# Patient Record
Sex: Female | Born: 1970 | Race: Black or African American | Hispanic: No | Marital: Single | Smoking: Never smoker
Health system: Southern US, Community
[De-identification: ages and names within clinical notes are randomized; demographics above are authoritative.]

## PROBLEM LIST (undated history)

## (undated) DIAGNOSIS — K7460 Unspecified cirrhosis of liver: Secondary | ICD-10-CM

## (undated) DIAGNOSIS — E079 Disorder of thyroid, unspecified: Secondary | ICD-10-CM

## (undated) DIAGNOSIS — E119 Type 2 diabetes mellitus without complications: Secondary | ICD-10-CM

## (undated) DIAGNOSIS — J449 Chronic obstructive pulmonary disease, unspecified: Secondary | ICD-10-CM

## (undated) DIAGNOSIS — F32A Depression, unspecified: Secondary | ICD-10-CM

## (undated) DIAGNOSIS — F329 Major depressive disorder, single episode, unspecified: Secondary | ICD-10-CM

## (undated) DIAGNOSIS — R011 Cardiac murmur, unspecified: Secondary | ICD-10-CM

## (undated) DIAGNOSIS — E669 Obesity, unspecified: Secondary | ICD-10-CM

## (undated) DIAGNOSIS — I10 Essential (primary) hypertension: Secondary | ICD-10-CM

## (undated) HISTORY — PX: THYROIDECTOMY: SHX17

---

## 2003-11-27 ENCOUNTER — Ambulatory Visit: Payer: Self-pay | Admitting: Internal Medicine

## 2003-12-21 ENCOUNTER — Ambulatory Visit: Payer: Self-pay | Admitting: Internal Medicine

## 2004-02-22 ENCOUNTER — Ambulatory Visit: Payer: Self-pay

## 2004-04-01 ENCOUNTER — Ambulatory Visit: Payer: Self-pay | Admitting: Unknown Physician Specialty

## 2004-04-06 ENCOUNTER — Emergency Department: Payer: Self-pay | Admitting: Emergency Medicine

## 2004-05-09 ENCOUNTER — Ambulatory Visit: Payer: Self-pay | Admitting: Oncology

## 2004-06-06 ENCOUNTER — Ambulatory Visit: Payer: Self-pay | Admitting: Oncology

## 2007-01-08 ENCOUNTER — Emergency Department: Payer: Self-pay | Admitting: Emergency Medicine

## 2008-05-17 ENCOUNTER — Emergency Department: Payer: Self-pay | Admitting: Internal Medicine

## 2008-05-18 ENCOUNTER — Emergency Department: Payer: Self-pay | Admitting: Emergency Medicine

## 2009-11-13 ENCOUNTER — Observation Stay: Payer: Self-pay | Admitting: Internal Medicine

## 2009-12-13 ENCOUNTER — Emergency Department: Payer: Self-pay | Admitting: Emergency Medicine

## 2010-11-28 ENCOUNTER — Inpatient Hospital Stay: Payer: Self-pay | Admitting: Internal Medicine

## 2011-09-13 ENCOUNTER — Emergency Department: Payer: Self-pay | Admitting: Emergency Medicine

## 2011-09-16 ENCOUNTER — Emergency Department: Payer: Self-pay | Admitting: Emergency Medicine

## 2011-09-26 ENCOUNTER — Emergency Department: Payer: Self-pay | Admitting: Emergency Medicine

## 2013-08-15 ENCOUNTER — Emergency Department (HOSPITAL_COMMUNITY)
Admission: EM | Admit: 2013-08-15 | Discharge: 2013-08-16 | Disposition: A | Discharge status: Home / Self Care | Payer: Medicaid Other | Attending: Emergency Medicine | Admitting: Emergency Medicine

## 2013-08-15 ENCOUNTER — Encounter (HOSPITAL_COMMUNITY): Payer: Self-pay | Admitting: Emergency Medicine

## 2013-08-15 DIAGNOSIS — R197 Diarrhea, unspecified: Secondary | ICD-10-CM | POA: Diagnosis not present

## 2013-08-15 DIAGNOSIS — Z3202 Encounter for pregnancy test, result negative: Secondary | ICD-10-CM | POA: Diagnosis not present

## 2013-08-15 DIAGNOSIS — J449 Chronic obstructive pulmonary disease, unspecified: Secondary | ICD-10-CM | POA: Diagnosis not present

## 2013-08-15 DIAGNOSIS — K8020 Calculus of gallbladder without cholecystitis without obstruction: Secondary | ICD-10-CM | POA: Insufficient documentation

## 2013-08-15 DIAGNOSIS — R739 Hyperglycemia, unspecified: Secondary | ICD-10-CM

## 2013-08-15 DIAGNOSIS — R109 Unspecified abdominal pain: Secondary | ICD-10-CM | POA: Insufficient documentation

## 2013-08-15 DIAGNOSIS — E119 Type 2 diabetes mellitus without complications: Secondary | ICD-10-CM | POA: Diagnosis not present

## 2013-08-15 DIAGNOSIS — J4489 Other specified chronic obstructive pulmonary disease: Secondary | ICD-10-CM | POA: Insufficient documentation

## 2013-08-15 HISTORY — DX: Type 2 diabetes mellitus without complications: E11.9

## 2013-08-15 HISTORY — DX: Chronic obstructive pulmonary disease, unspecified: J44.9

## 2013-08-15 HISTORY — DX: Obesity, unspecified: E66.9

## 2013-08-15 NOTE — ED Notes (Signed)
Pt. reports intermittent low abdominal pain , low back pain and diarrhea for 2 weeks , denies nausea or vomitting , no fever or chills.

## 2013-08-16 ENCOUNTER — Encounter (HOSPITAL_COMMUNITY): Payer: Self-pay

## 2013-08-16 ENCOUNTER — Emergency Department (HOSPITAL_COMMUNITY): Payer: Medicaid Other

## 2013-08-16 LAB — PREGNANCY, URINE: PREG TEST UR: NEGATIVE

## 2013-08-16 LAB — CBG MONITORING, ED
GLUCOSE-CAPILLARY: 405 mg/dL — AB (ref 70–99)
GLUCOSE-CAPILLARY: 277 mg/dL — AB (ref 70–99)
Glucose-Capillary: 351 mg/dL — ABNORMAL HIGH (ref 70–99)

## 2013-08-16 LAB — URINE MICROSCOPIC-ADD ON

## 2013-08-16 LAB — URINALYSIS, ROUTINE W REFLEX MICROSCOPIC
Bilirubin Urine: NEGATIVE
Glucose, UA: >1000 mg/dL — AB
Hgb urine dipstick: NEGATIVE
Ketones, ur: NEGATIVE mg/dL
Leukocytes, UA: NEGATIVE
NITRITE: NEGATIVE
PH: 6 (ref 5.0–8.0)
Protein, ur: NEGATIVE mg/dL
SPECIFIC GRAVITY, URINE: 1.042 — AB (ref 1.005–1.030)
Urobilinogen, UA: 1 mg/dL (ref 0.0–1.0)

## 2013-08-16 LAB — CBC WITH DIFFERENTIAL/PLATELET
BASOS ABS: 0 10*3/uL (ref 0.0–0.1)
BASOS PCT: 0 % (ref 0–1)
Eosinophils Absolute: 0.2 10*3/uL (ref 0.0–0.7)
Eosinophils Relative: 1 % (ref 0–5)
HCT: 40 % (ref 36.0–46.0)
HEMOGLOBIN: 12.9 g/dL (ref 12.0–15.0)
Lymphocytes Relative: 38 % (ref 12–46)
Lymphs Abs: 4 10*3/uL (ref 0.7–4.0)
MCH: 28.1 pg (ref 26.0–34.0)
MCHC: 32.3 g/dL (ref 30.0–36.0)
MCV: 87.1 fL (ref 78.0–100.0)
Monocytes Absolute: 0.8 10*3/uL (ref 0.1–1.0)
Monocytes Relative: 7 % (ref 3–12)
NEUTROS ABS: 5.6 10*3/uL (ref 1.7–7.7)
NEUTROS PCT: 54 % (ref 43–77)
PLATELETS: 235 10*3/uL (ref 150–400)
RBC: 4.59 MIL/uL (ref 3.87–5.11)
RDW: 14.4 % (ref 11.5–15.5)
WBC: 10.5 10*3/uL (ref 4.0–10.5)

## 2013-08-16 LAB — COMPREHENSIVE METABOLIC PANEL
ALBUMIN: 3 g/dL — AB (ref 3.5–5.2)
ALK PHOS: 182 U/L — AB (ref 39–117)
ALT: 85 U/L — ABNORMAL HIGH (ref 0–35)
AST: 68 U/L — AB (ref 0–37)
Anion gap: 13 (ref 5–15)
BUN: 9 mg/dL (ref 6–23)
CO2: 22 mEq/L (ref 19–32)
Calcium: 8.4 mg/dL (ref 8.4–10.5)
Chloride: 99 mEq/L (ref 96–112)
Creatinine, Ser: 0.62 mg/dL (ref 0.50–1.10)
GFR calc Af Amer: >90 mL/min (ref >90)
GFR calc non Af Amer: >90 mL/min (ref >90)
Glucose, Bld: 493 mg/dL — ABNORMAL HIGH (ref 70–99)
POTASSIUM: 4 meq/L (ref 3.7–5.3)
Sodium: 134 mEq/L — ABNORMAL LOW (ref 137–147)
TOTAL PROTEIN: 7.9 g/dL (ref 6.0–8.3)
Total Bilirubin: 0.2 mg/dL — ABNORMAL LOW (ref 0.3–1.2)

## 2013-08-16 MED ORDER — INSULIN ASPART 100 UNIT/ML ~~LOC~~ SOLN
10.0000 [IU] | Freq: Once | SUBCUTANEOUS | Status: AC
Start: 2013-08-16 — End: 2013-08-16
  Administered 2013-08-16: 10 [IU] via INTRAVENOUS
  Filled 2013-08-16: qty 1

## 2013-08-16 MED ORDER — MORPHINE SULFATE 4 MG/ML IJ SOLN
4.0000 mg | Freq: Once | INTRAMUSCULAR | Status: AC
  Administered 2013-08-16: 4 mg via INTRAVENOUS
  Filled 2013-08-16: qty 1

## 2013-08-16 MED ORDER — SODIUM CHLORIDE 0.9 % IV SOLN: 1000.0000 mL | INTRAVENOUS | Status: DC

## 2013-08-16 MED ORDER — OXYCODONE-ACETAMINOPHEN 5-325 MG PO TABS: 1.0000 | ORAL_TABLET | ORAL | Status: DC | PRN

## 2013-08-16 MED ORDER — INSULIN ASPART 100 UNIT/ML ~~LOC~~ SOLN
10.0000 [IU] | Freq: Once | SUBCUTANEOUS | Status: AC
  Administered 2013-08-16: 10 [IU] via INTRAVENOUS
  Filled 2013-08-16: qty 1

## 2013-08-16 MED ORDER — IOHEXOL 300 MG/ML  SOLN: 25.0000 mL | INTRAMUSCULAR | Status: AC

## 2013-08-16 MED ORDER — SODIUM CHLORIDE 0.9 % IV SOLN
1000.0000 mL | Freq: Once | INTRAVENOUS | Status: AC
  Administered 2013-08-16: 1000 mL via INTRAVENOUS

## 2013-08-16 MED ORDER — IOHEXOL 300 MG/ML  SOLN
100.0000 mL | Freq: Once | INTRAMUSCULAR | Status: AC | PRN
  Administered 2013-08-16: 100 mL via INTRAVENOUS

## 2013-08-16 NOTE — ED Notes (Signed)
Pt finished with oral CT contrast. Thayer Ohmhris from CT made aware.

## 2013-08-16 NOTE — ED Notes (Signed)
Pt A&Ox4, ambulatory at d/c with steady gait, ,NAD

## 2013-08-16 NOTE — Discharge Instructions (Signed)
Resume taking all of your medications until you can discuss them with your PCP.   Abdominal Pain Many things can cause abdominal pain. Usually, abdominal pain is not caused by a disease and will improve without treatment. It can often be observed and treated at home. Your health care provider will do a physical exam and possibly order blood tests and X-rays to help determine the seriousness of your pain. However, in many cases, more time must pass before a clear cause of the pain can be found. Before that point, your health care provider may not know if you need more testing or further treatment. HOME CARE INSTRUCTIONS  Monitor your abdominal pain for any changes. The following actions may help to alleviate any discomfort you are experiencing:  Only take over-the-counter or prescription medicines as directed by your health care provider.  Do not take laxatives unless directed to do so by your health care provider.  Try a clear liquid diet (broth, tea, or water) as directed by your health care provider. Slowly move to a bland diet as tolerated. SEEK MEDICAL CARE IF:  You have unexplained abdominal pain.  You have abdominal pain associated with nausea or diarrhea.  You have pain when you urinate or have a bowel movement.  You experience abdominal pain that wakes you in the night.  You have abdominal pain that is worsened or improved by eating food.  You have abdominal pain that is worsened with eating fatty foods.  You have a fever. SEEK IMMEDIATE MEDICAL CARE IF:   Your pain does not go away within 2 hours.  You keep throwing up (vomiting).  Your pain is felt only in portions of the abdomen, such as the right side or the left lower portion of the abdomen.  You pass bloody or black tarry stools. MAKE SURE YOU:  Understand these instructions.   Will watch your condition.   Will get help right away if you are not doing well or get worse.  Document Released: 10/02/2004  Document Revised: 12/28/2012 Document Reviewed: 09/01/2012 [employer] Patient Information 2015 [place], [place]. This information is not intended to replace advice given to you by your health care provider. Make sure you discuss any questions you have with your health care provider.  Cholelithiasis Cholelithiasis (also called gallstones) is a form of gallbladder disease in which gallstones form in your gallbladder. The gallbladder is an organ that stores bile made in the liver, which helps digest fats. Gallstones begin as small crystals and slowly grow into stones. Gallstone pain occurs when the gallbladder spasms and a gallstone is blocking the duct. Pain can also occur when a stone passes out of the duct.  RISK FACTORS  Being female.   Having multiple pregnancies. Health care providers sometimes advise removing diseased gallbladders before future pregnancies.   Being obese.  Eating a diet heavy in fried foods and fat.   Being older than 60 years and increasing age.   Prolonged use of medicines containing female hormones.   Having diabetes mellitus.   Rapidly losing weight.   Having a family history of gallstones (heredity).  SYMPTOMS  Nausea.   Vomiting.  Abdominal pain.   Yellowing of the skin (jaundice).   Sudden pain. It may persist from several minutes to several hours.  Fever.   Tenderness to the touch. In some cases, when gallstones do not move into the bile duct, people have no pain or symptoms. These are called "silent" gallstones.  TREATMENT Silent gallstones do not need treatment.  In severe cases, emergency surgery may be required. Options for treatment include:  Surgery to remove the gallbladder. This is the most common treatment.  Medicines. These do not always work and may take 6-12 months or more to work.  Shock wave treatment (extracorporeal biliary lithotripsy). In this treatment an ultrasound machine sends shock waves to the gallbladder to  break gallstones into smaller pieces that can pass into the intestines or be dissolved by medicine. HOME CARE INSTRUCTIONS   Only take over-the-counter or prescription medicines for pain, discomfort, or fever as directed by your health care provider.   Follow a low-fat diet until seen again by your health care provider. Fat causes the gallbladder to contract, which can result in pain.   Follow up with your health care provider as directed. Attacks are almost always recurrent and surgery is usually required for permanent treatment.  SEEK IMMEDIATE MEDICAL CARE IF:   Your pain increases and is not controlled by medicines.   You have a fever or persistent symptoms for more than 2-3 days.   You have a fever and your symptoms suddenly get worse.   You have persistent nausea and vomiting.  MAKE SURE YOU:   Understand these instructions.  Will watch your condition.  Will get help right away if you are not doing well or get worse. Document Released: 12/19/2004 Document Revised: 08/25/2012 Document Reviewed: 06/16/2012 [facility] Patient Information 2015 [place], [place]. This information is not intended to replace advice given to you by your health care provider. Make sure you discuss any questions you have with your health care provider.   Acetaminophen; Oxycodone tablets What is this medicine? ACETAMINOPHEN; OXYCODONE (a set a MEE noe fen; ox i KOE done) is a pain reliever. It is used to treat mild to moderate pain. This medicine may be used for other purposes; ask your health care provider or pharmacist if you have questions. COMMON BRAND NAME(S): Endocet, Magnacet, Narvox, Percocet, Perloxx, Primalev, Primlev, Roxicet, Xolox What should I tell my health care provider before I take this medicine? They need to know if you have any of these conditions: -brain tumor -Crohn's disease, inflammatory bowel disease, or ulcerative colitis -drug abuse or addiction -head injury -heart or  circulation problems -if you often drink alcohol -kidney disease or problems going to the bathroom -liver disease -lung disease, asthma, or breathing problems -an unusual or allergic reaction to acetaminophen, oxycodone, other opioid analgesics, other medicines, foods, dyes, or preservatives -pregnant or trying to get pregnant -breast-feeding How should I use this medicine? Take this medicine by mouth with a full glass of water. Follow the directions on the prescription label. Take your medicine at regular intervals. Do not take your medicine more often than directed. Talk to your pediatrician regarding the use of this medicine in children. Special care may be needed. Patients over 37 years old may have a stronger reaction and need a smaller dose. Overdosage: If you think you have taken too much of this medicine contact a poison control center or emergency room at once. NOTE: This medicine is only for you. Do not share this medicine with others. What if I miss a dose? If you miss a dose, take it as soon as you can. If it is almost time for your next dose, take only that dose. Do not take double or extra doses. What may interact with this medicine? -alcohol -antihistamines -barbiturates like amobarbital, butalbital, butabarbital, methohexital, pentobarbital, phenobarbital, thiopental, and secobarbital -benztropine -drugs for bladder problems like solifenacin, trospium,  oxybutynin, tolterodine, hyoscyamine, and methscopolamine -drugs for breathing problems like ipratropium and tiotropium -drugs for certain stomach or intestine problems like propantheline, homatropine methylbromide, glycopyrrolate, atropine, belladonna, and dicyclomine -general anesthetics like etomidate, ketamine, nitrous oxide, propofol, desflurane, enflurane, halothane, isoflurane, and sevoflurane -medicines for depression, anxiety, or psychotic disturbances -medicines for sleep -muscle relaxants -naltrexone -narcotic  medicines (opiates) for pain -phenothiazines like perphenazine, thioridazine, chlorpromazine, mesoridazine, fluphenazine, prochlorperazine, promazine, and trifluoperazine -scopolamine -tramadol -trihexyphenidyl This list may not describe all possible interactions. Give your health care provider a list of all the medicines, herbs, non-prescription drugs, or dietary supplements you use. Also tell them if you smoke, drink alcohol, or use illegal drugs. Some items may interact with your medicine. What should I watch for while using this medicine? Tell your doctor or health care professional if your pain does not go away, if it gets worse, or if you have new or a different type of pain. You may develop tolerance to the medicine. Tolerance means that you will need a higher dose of the medication for pain relief. Tolerance is normal and is expected if you take this medicine for a long time. Do not suddenly stop taking your medicine because you may develop a severe reaction. Your body becomes used to the medicine. This does NOT mean you are addicted. Addiction is a behavior related to getting and using a drug for a non-medical reason. If you have pain, you have a medical reason to take pain medicine. Your doctor will tell you how much medicine to take. If your doctor wants you to stop the medicine, the dose will be slowly lowered over time to avoid any side effects. You may get drowsy or dizzy. Do not drive, use machinery, or do anything that needs mental alertness until you know how this medicine affects you. Do not stand or sit up quickly, especially if you are an older patient. This reduces the risk of dizzy or fainting spells. Alcohol may interfere with the effect of this medicine. Avoid alcoholic drinks. There are different types of narcotic medicines (opiates) for pain. If you take more than one type at the same time, you may have more side effects. Give your health care provider a list of all medicines you  use. Your doctor will tell you how much medicine to take. Do not take more medicine than directed. Call emergency for help if you have problems breathing. The medicine will cause constipation. Try to have a bowel movement at least every 2 to 3 days. If you do not have a bowel movement for 3 days, call your doctor or health care professional. Do not take Tylenol (acetaminophen) or medicines that have acetaminophen with this medicine. Too much acetaminophen can be very dangerous. Many nonprescription medicines contain acetaminophen. Always read the labels carefully to avoid taking more acetaminophen. What side effects may I notice from receiving this medicine? Side effects that you should report to your doctor or health care professional as soon as possible: -allergic reactions like skin rash, itching or hives, swelling of the face, lips, or tongue -breathing difficulties, wheezing -confusion -light headedness or fainting spells -severe stomach pain -unusually weak or tired -yellowing of the skin or the whites of the eyes Side effects that usually do not require medical attention (report to your doctor or health care professional if they continue or are bothersome): -dizziness -drowsiness -nausea -vomiting This list may not describe all possible side effects. Call your doctor for medical advice about side effects. You may report side  effects to FDA at 1-800-FDA-1088. Where should I keep my medicine? Keep out of the reach of children. This medicine can be abused. Keep your medicine in a safe place to protect it from theft. Do not share this medicine with anyone. Selling or giving away this medicine is dangerous and against the law. Store at room temperature between 20 and 25 degrees C (68 and 77 degrees F). Keep container tightly closed. Protect from light. This medicine may cause accidental overdose and death if it is taken by other adults, children, or pets. Flush any unused medicine down the  toilet to reduce the chance of harm. Do not use the medicine after the expiration date. NOTE: This sheet is a summary. It may not cover all possible information. If you have questions about this medicine, talk to your doctor, pharmacist, or health care provider.  2015, [employer]. (2012-08-16 13:17:35)

## 2013-08-16 NOTE — ED Provider Notes (Signed)
CSN: [id number]     Arrival date & time 08/15/13  2220 History   First MD Initiated Contact with Patient 08/16/13 0045     Chief Complaint  Patient presents with  . Abdominal Pain     (Consider location/radiation/quality/duration/timing/severity/associated sxs/prior Treatment) Patient is a 43 y.o. female presenting with abdominal pain. The history is provided by the patient.  Abdominal Pain She has been having difficulty with suprapubic pain for the last 2 weeks. She states that pain will radiate to the left side of her abdomen and she is in the left side and will radiate to the right side of her abdomen she lays on her right side and will radiate throughout her entire abdomen the patient is in her abdomen. She rates it a 10/10. Nothing makes it better. She denies nausea vomiting. She denies fevers, chills, sweats. She developed diarrhea 3 days ago and has been having one loose bowel movement a day since then. It is not affected by bowel movement. She has had urinary frequency but not urgency or tenesmus or dysuria. Of note, she is diabetic and states that she stopped taking her Invokana and another oral medication because he heard they were involved in a lawsuit. She has not discussed this with her PCP. She has not been able to check her blood sugars because her monitor is not working and she has not been able to get a replacement.  Past Medical History  Diagnosis Date  . Diabetes mellitus without complication   . COPD (chronic obstructive pulmonary disease)   . Obesity    Past Surgical History  Procedure Laterality Date  . Thyroidectomy    . Cesarean section     No family history on file. History  Substance Use Topics  . Smoking status: Never Smoker   . Smokeless tobacco: Not on file  . Alcohol Use: No   OB History   Grav Para Term Preterm Abortions TAB SAB Ect Mult Living                 Review of Systems  Gastrointestinal: Positive for abdominal pain.  All other systems  reviewed and are negative.     Allergies  Review of patient's allergies indicates no known allergies.  Home Medications   Prior to Admission medications   Not on File   BP 131/81  Pulse 106  Temp(Src) 98.4 F (36.9 C) (Oral)  Resp 14  Ht 5\' 7"  (1.702 m)  Wt 242 lb (109.77 kg)  BMI 37.89 kg/m2  SpO2 97% Physical Exam  Nursing note and vitals reviewed.  Morbidly obese 43 year old female, resting comfortably and in no acute distress. Vital signs are significant for tachycardia with heart rate 106. Oxygen saturation is 97%, which is normal. Head is normocephalic and atraumatic. PERRLA, EOMI. Oropharynx is clear. Neck is nontender and supple without adenopathy or JVD. Back is nontender and there is no CVA tenderness. Lungs are clear without rales, wheezes, or rhonchi. Chest is nontender. Heart has regular rate and rhythm without murmur. Abdomen is soft, flat, with mild tenderness diffusely. There is no rebound or guarding. There are no masses or hepatosplenomegaly and peristalsis is hypoactive. Extremities have trace edema, full range of motion is present. Skin is warm and dry without rash. Neurologic: Mental status is normal, cranial nerves are intact, there are no motor or sensory deficits.  ED Course  Procedures (including critical care time) Labs Review Results for orders placed during the hospital encounter of 08/15/13  CBC  WITH DIFFERENTIAL      Result Value Ref Range   WBC 10.5  4.0 - 10.5 K/uL   RBC 4.59  3.87 - 5.11 MIL/uL   Hemoglobin 12.9  12.0 - 15.0 g/dL   HCT 98.1  19.1 - 47.8 %   MCV 87.1  78.0 - 100.0 fL   MCH 28.1  26.0 - 34.0 pg   MCHC 32.3  30.0 - 36.0 g/dL   RDW 29.5  62.1 - 30.8 %   Platelets 235  150 - 400 K/uL   Neutrophils Relative % 54  43 - 77 %   Neutro Abs 5.6  1.7 - 7.7 K/uL   Lymphocytes Relative 38  12 - 46 %   Lymphs Abs 4.0  0.7 - 4.0 K/uL   Monocytes Relative 7  3 - 12 %   Monocytes Absolute 0.8  0.1 - 1.0 K/uL   Eosinophils  Relative 1  0 - 5 %   Eosinophils Absolute 0.2  0.0 - 0.7 K/uL   Basophils Relative 0  0 - 1 %   Basophils Absolute 0.0  0.0 - 0.1 K/uL  COMPREHENSIVE METABOLIC PANEL      Result Value Ref Range   Sodium 134 (*) 137 - 147 mEq/L   Potassium 4.0  3.7 - 5.3 mEq/L   Chloride 99  96 - 112 mEq/L   CO2 22  19 - 32 mEq/L   Glucose, Bld 493 (*) 70 - 99 mg/dL   BUN 9  6 - 23 mg/dL   Creatinine, Ser 6.57  0.50 - 1.10 mg/dL   Calcium 8.4  8.4 - 84.6 mg/dL   Total Protein 7.9  6.0 - 8.3 g/dL   Albumin 3.0 (*) 3.5 - 5.2 g/dL   AST 68 (*) 0 - 37 U/L   ALT 85 (*) 0 - 35 U/L   Alkaline Phosphatase 182 (*) 39 - 117 U/L   Total Bilirubin 0.2 (*) 0.3 - 1.2 mg/dL   GFR calc non Af Amer >90  >90 mL/min   GFR calc Af Amer >90  >90 mL/min   Anion gap 13  5 - 15  PREGNANCY, URINE      Result Value Ref Range   Preg Test, Ur NEGATIVE  NEGATIVE  URINALYSIS, ROUTINE W REFLEX MICROSCOPIC      Result Value Ref Range   Color, Urine YELLOW  YELLOW   APPearance CLEAR  CLEAR   Specific Gravity, Urine 1.042 (*) 1.005 - 1.030   pH 6.0  5.0 - 8.0   Glucose, UA >1000 (*) NEGATIVE mg/dL   Hgb urine dipstick NEGATIVE  NEGATIVE   Bilirubin Urine NEGATIVE  NEGATIVE   Ketones, ur NEGATIVE  NEGATIVE mg/dL   Protein, ur NEGATIVE  NEGATIVE mg/dL   Urobilinogen, UA 1.0  0.0 - 1.0 mg/dL   Nitrite NEGATIVE  NEGATIVE   Leukocytes, UA NEGATIVE  NEGATIVE  URINE MICROSCOPIC-ADD ON      Result Value Ref Range   Squamous Epithelial / LPF FEW (*) RARE   WBC, UA 0-2  <3 WBC/hpf   RBC / HPF 0-2  <3 RBC/hpf   Bacteria, UA FEW (*) RARE   Urine-Other FEW YEAST    CBG MONITORING, ED      Result Value Ref Range   Glucose-Capillary 405 (*) 70 - 99 mg/dL  CBG MONITORING, ED      Result Value Ref Range   Glucose-Capillary 351 (*) 70 - 99 mg/dL   Comment 1 Notify RN  Comment 2 Documented in Chart    CBG MONITORING, ED      Result Value Ref Range   Glucose-Capillary 277 (*) 70 - 99 mg/dL   Comment 1 Notify RN     Comment 2  Documented in Chart     Imaging Review Ct Abdomen Pelvis W Contrast  08/16/2013   CLINICAL DATA:  Intermittent lower abdominal pain. Low back pain. Diarrhea.  EXAM: CT ABDOMEN AND PELVIS WITH CONTRAST  TECHNIQUE: Multidetector CT imaging of the abdomen and pelvis was performed using the standard protocol following bolus administration of intravenous contrast.  CONTRAST:  OMNIPAQUE IOHEXOL 300 MG/ML  SOLN  COMPARISON:  No priors.  FINDINGS: Lung Bases: Unremarkable.  Abdomen/Pelvis: There are several noncalcified partially cavitary gallstones in the gallbladder, and in the neck of the gallbladder there is a densely calcified gallstone. Gallbladder appears moderately distended, filled with high attenuation material (likely biliary sludge). However, there is no pericholecystic fluid or surrounding inflammatory changes to strongly suggest an acute cholecystitis at this time. The liver has a very subtle nodular contour, suggestive of early changes of cirrhosis. No focal cystic or solid hepatic lesion. The appearance of the pancreas, spleen, bilateral adrenal glands and bilateral kidneys is unremarkable.  No significant volume of ascites. No pneumoperitoneum. No pathologic distention of small bowel. Normal appendix. No lymphadenopathy identified in the abdomen or pelvis. There are multiple borderline enlarged retroperitoneal lymph nodes which are conspicuous by the number rather than their size, however, these are nonspecific. Uterus and ovaries are unremarkable in appearance. Urinary bladder is normal in appearance.  Musculoskeletal: There are no aggressive appearing lytic or blastic lesions noted in the visualized portions of the skeleton.  IMPRESSION: 1. Cholelithiasis without findings to suggest an acute cholecystitis at this time. However, the gallbladder is moderately distended, largely filled with a combination of gallstones and biliary sludge. 2. Early morphologic changes in the liver suggestive of  cirrhosis. 3. Normal appendix.   Electronically Signed   By: Trudie Reed M.D.   On: 08/16/2013 02:13   MDM   Final diagnoses:  None    Abdominal pain of uncertain cause. WBC is normal and exam is relatively benign. However, she is diabetic and running blood sugars of close to 500. Anion gap is normal so she is not in ketoacidosis. Mild elevation of transaminases and alkaline phosphatase is noted of uncertain cause but unlikely to be related to her current complaints. Because of underlying diabetes, I am concerned that there may be significant pathology which is being obscured by her diabetes and she is sent for CT scan. She will be given IV fluid bolus and intravenous insulin. She has no records available.  CT shows evidence of cholelithiasis without cholecystitis. No acute findings are present. Patient did note significant relief of pain with morphine but pain is coming back. Blood sugars come down to 70/400. She'll be given another dose of insulin and hand dose of morphine will be repeated. She will be referred to Gen. surgery to evaluate for possible elective cholecystectomy and will be discharged with prescription for oxycodone-acetaminophen.  Dione Booze, MD 08/16/13 [phone]

## 2013-08-16 NOTE — ED Notes (Signed)
Patient presents stating that she has been having lower abd pain  Denies discharge or painful urination.  States she has been going to the [facility] frequently.  States everyday between 11-12 she has a bout of diahrrea.  Denies N/V

## 2013-08-21 ENCOUNTER — Emergency Department (HOSPITAL_BASED_OUTPATIENT_CLINIC_OR_DEPARTMENT_OTHER)
Admission: EM | Admit: 2013-08-21 | Discharge: 2013-08-21 | Disposition: A | Discharge status: Home / Self Care | Payer: Medicaid Other | Attending: Emergency Medicine | Admitting: Emergency Medicine

## 2013-08-21 ENCOUNTER — Emergency Department (HOSPITAL_BASED_OUTPATIENT_CLINIC_OR_DEPARTMENT_OTHER): Payer: Medicaid Other

## 2013-08-21 ENCOUNTER — Encounter (HOSPITAL_BASED_OUTPATIENT_CLINIC_OR_DEPARTMENT_OTHER): Payer: Self-pay | Admitting: Emergency Medicine

## 2013-08-21 DIAGNOSIS — J449 Chronic obstructive pulmonary disease, unspecified: Secondary | ICD-10-CM | POA: Diagnosis not present

## 2013-08-21 DIAGNOSIS — Z79899 Other long term (current) drug therapy: Secondary | ICD-10-CM | POA: Diagnosis not present

## 2013-08-21 DIAGNOSIS — R197 Diarrhea, unspecified: Secondary | ICD-10-CM | POA: Diagnosis not present

## 2013-08-21 DIAGNOSIS — E669 Obesity, unspecified: Secondary | ICD-10-CM | POA: Insufficient documentation

## 2013-08-21 DIAGNOSIS — E119 Type 2 diabetes mellitus without complications: Secondary | ICD-10-CM | POA: Diagnosis not present

## 2013-08-21 DIAGNOSIS — Z794 Long term (current) use of insulin: Secondary | ICD-10-CM | POA: Diagnosis not present

## 2013-08-21 DIAGNOSIS — R102 Pelvic and perineal pain: Secondary | ICD-10-CM

## 2013-08-21 DIAGNOSIS — Z9889 Other specified postprocedural states: Secondary | ICD-10-CM | POA: Insufficient documentation

## 2013-08-21 DIAGNOSIS — N949 Unspecified condition associated with female genital organs and menstrual cycle: Secondary | ICD-10-CM | POA: Insufficient documentation

## 2013-08-21 DIAGNOSIS — Z3202 Encounter for pregnancy test, result negative: Secondary | ICD-10-CM | POA: Diagnosis not present

## 2013-08-21 DIAGNOSIS — J4489 Other specified chronic obstructive pulmonary disease: Secondary | ICD-10-CM | POA: Insufficient documentation

## 2013-08-21 DIAGNOSIS — R1032 Left lower quadrant pain: Secondary | ICD-10-CM | POA: Insufficient documentation

## 2013-08-21 DIAGNOSIS — R110 Nausea: Secondary | ICD-10-CM | POA: Insufficient documentation

## 2013-08-21 LAB — URINALYSIS, ROUTINE W REFLEX MICROSCOPIC
Bilirubin Urine: NEGATIVE
Glucose, UA: >1000 mg/dL — AB
Hgb urine dipstick: NEGATIVE
KETONES UR: NEGATIVE mg/dL
LEUKOCYTES UA: NEGATIVE
NITRITE: NEGATIVE
PROTEIN: NEGATIVE mg/dL
Specific Gravity, Urine: 1.041 — ABNORMAL HIGH (ref 1.005–1.030)
UROBILINOGEN UA: 1 mg/dL (ref 0.0–1.0)
pH: 6 (ref 5.0–8.0)

## 2013-08-21 LAB — WET PREP, GENITAL
CLUE CELLS WET PREP: NONE SEEN
TRICH WET PREP: NONE SEEN

## 2013-08-21 LAB — URINE MICROSCOPIC-ADD ON

## 2013-08-21 LAB — COMPREHENSIVE METABOLIC PANEL
ALBUMIN: 3.2 g/dL — AB (ref 3.5–5.2)
ALK PHOS: 185 U/L — AB (ref 39–117)
ALT: 103 U/L — AB (ref 0–35)
AST: 68 U/L — AB (ref 0–37)
Anion gap: 14 (ref 5–15)
BILIRUBIN TOTAL: 0.4 mg/dL (ref 0.3–1.2)
BUN: 10 mg/dL (ref 6–23)
CHLORIDE: 97 meq/L (ref 96–112)
CO2: 23 mEq/L (ref 19–32)
Calcium: 9.5 mg/dL (ref 8.4–10.5)
Creatinine, Ser: 0.8 mg/dL (ref 0.50–1.10)
GFR calc Af Amer: >90 mL/min (ref >90)
GFR calc non Af Amer: 90 mL/min — ABNORMAL LOW (ref >90)
Glucose, Bld: 414 mg/dL — ABNORMAL HIGH (ref 70–99)
POTASSIUM: 3.9 meq/L (ref 3.7–5.3)
SODIUM: 134 meq/L — AB (ref 137–147)
TOTAL PROTEIN: 8.4 g/dL — AB (ref 6.0–8.3)

## 2013-08-21 LAB — CBC WITH DIFFERENTIAL/PLATELET
BASOS ABS: 0.1 10*3/uL (ref 0.0–0.1)
BASOS PCT: 1 % (ref 0–1)
Eosinophils Absolute: 0.2 10*3/uL (ref 0.0–0.7)
Eosinophils Relative: 2 % (ref 0–5)
HCT: 38.4 % (ref 36.0–46.0)
HEMOGLOBIN: 12.7 g/dL (ref 12.0–15.0)
LYMPHS PCT: 40 % (ref 12–46)
Lymphs Abs: 3.7 10*3/uL (ref 0.7–4.0)
MCH: 28.4 pg (ref 26.0–34.0)
MCHC: 33.1 g/dL (ref 30.0–36.0)
MCV: 85.9 fL (ref 78.0–100.0)
MONO ABS: 0.7 10*3/uL (ref 0.1–1.0)
MONOS PCT: 8 % (ref 3–12)
NEUTROS ABS: 4.7 10*3/uL (ref 1.7–7.7)
Neutrophils Relative %: 50 % (ref 43–77)
Platelets: 223 10*3/uL (ref 150–400)
RBC: 4.47 MIL/uL (ref 3.87–5.11)
RDW: 14.4 % (ref 11.5–15.5)
WBC: 9.3 10*3/uL (ref 4.0–10.5)

## 2013-08-21 LAB — PREGNANCY, URINE: PREG TEST UR: NEGATIVE

## 2013-08-21 LAB — RPR

## 2013-08-21 LAB — HIV ANTIBODY (ROUTINE TESTING W REFLEX): HIV 1&2 Ab, 4th Generation: NONREACTIVE

## 2013-08-21 MED ORDER — METFORMIN HCL 500 MG PO TABS: 500.0000 mg | ORAL_TABLET | Freq: Two times a day (BID) | ORAL | Status: DC

## 2013-08-21 MED ORDER — OXYCODONE-ACETAMINOPHEN 5-325 MG PO TABS
2.0000 | ORAL_TABLET | Freq: Once | ORAL | Status: AC
  Administered 2013-08-21: 2 via ORAL
  Filled 2013-08-21: qty 2

## 2013-08-21 MED ORDER — FLUCONAZOLE 50 MG PO TABS
150.0000 mg | ORAL_TABLET | Freq: Once | ORAL | Status: AC
  Administered 2013-08-21: 150 mg via ORAL
  Filled 2013-08-21 (×2): qty 1

## 2013-08-21 MED ORDER — METFORMIN HCL 500 MG PO TABS
500.0000 mg | ORAL_TABLET | Freq: Once | ORAL | Status: AC
  Administered 2013-08-21: 500 mg via ORAL
  Filled 2013-08-21: qty 1

## 2013-08-21 MED ORDER — OXYCODONE HCL 5 MG PO TABS: 5.0000 mg | ORAL_TABLET | ORAL | Status: DC | PRN

## 2013-08-21 NOTE — ED Notes (Signed)
MD at bedside. 

## 2013-08-21 NOTE — ED Provider Notes (Addendum)
CSN: [id number]     Arrival date & time 08/21/13  1341 History  This chart was scribed for Doug Sou, MD by Julian Hy, ED Scribe. The patient was seen in [facility]. The patient's care was started at 3:48 PM.    Chief Complaint  Patient presents with  . Abdominal Pain    Patient is a 43 y.o. female presenting with abdominal pain. The history is provided by the patient. No language interpreter was used.  Abdominal Pain Pain location:  LLQ and RLQ Pain radiates to:  Back Pain severity:  Moderate Duration:  3 weeks Timing:  Intermittent Progression:  Worsening Context: not previous surgeries and not recent sexual activity   Relieved by: Percocet. Worsened by:  Movement Associated symptoms: diarrhea and nausea   Associated symptoms: no dysuria, no fever, no vaginal discharge and no vomiting    HPI Comments: Sheila Gibson is a 44 y.o. female who presents to the Emergency Department complaining of new, intermittent, severe lower abdominal pain onset 3 weeks ago. Pt states her pain feels like "labor" pain and states her pain is worse than her previous pain experienced giving birth. Pt reports this episode started last night approximately 21 hours ago. Pt states she has associated nausea and diarrhea. Pt reports she took Tramadol without relief. Pt was seen in the ED for similar symptoms on 8/10 2015, , at that time c/o pain for 2 weeks per old chart,and was prescribed Percocet and found relief. Pt reports movement worsens her pain. Pt denies her pain is not associated with eating. Pt reports she was unable to see her PCP 2 days ago  due to emergency that MD had. Pt reports she has loose, diarrhea-like BM every night around 10:00pm. Pt denies previous abdom is inal surgeries except for two cesarean sections. Pt reports she ate 1 pancake, eggs and bacon this morning. Pt reports her last BM was this morning and was normal. Pt reports she has irregular periods. Pt denies dysuria, vomiting,  vaginal discharge,and fever. Not sexually active  Pt reports she has COPD and diabetes. LMP: July 2015.   Past Medical History  Diagnosis Date  . Diabetes mellitus without complication   . COPD (chronic obstructive pulmonary disease)   . Obesity    Past Surgical History  Procedure Laterality Date  . Thyroidectomy    . Cesarean section     No family history on file. History  Substance Use Topics  . Smoking status: Never Smoker   . Smokeless tobacco: Not on file  . Alcohol Use: No   OB History   Grav Para Term Preterm Abortions TAB SAB Ect Mult Living                 Review of Systems  Constitutional: Negative.  Negative for fever.  HENT: Negative.   Respiratory: Negative.   Cardiovascular: Negative.   Gastrointestinal: Positive for nausea, abdominal pain and diarrhea. Negative for vomiting.  Genitourinary: Negative for dysuria and vaginal discharge.  Musculoskeletal: Negative.   Skin: Negative.   Neurological: Negative.   Psychiatric/Behavioral: Negative.   All other systems reviewed and are negative.     Allergies  Review of patient's allergies indicates no known allergies.  Home Medications   Prior to Admission medications   Medication Sig Start Date End Date Taking? Authorizing Provider  fluconazole (DIFLUCAN) 150 MG tablet Take 150 mg by mouth once. Three day course    Historical Provider, MD  insulin glargine (LANTUS) 100 unit/mL SOPN Inject 60 Units  into the skin at bedtime.    Historical Provider, MD  oxybutynin (DITROPAN) 5 MG tablet Take 5 mg by mouth 3 (three) times daily.    Historical Provider, MD  oxyCODONE-acetaminophen (PERCOCET) 5-325 MG per tablet Take 1 tablet by mouth every 4 (four) hours as needed for moderate pain. 08/16/13   Dione Booze, MD   Triage Vitals: BP 153/94  Pulse 106  Temp(Src) 98.1 F (36.7 C) (Oral)  Resp 20  Ht 5\' 7"  (1.702 m)  Wt 240 lb (108.863 kg)  BMI 37.58 kg/m2  SpO2 99% Physical Exam  Nursing note and vitals  reviewed. Constitutional: She appears well-developed and well-nourished.  HENT:  Head: Normocephalic and atraumatic.  Eyes: Conjunctivae are normal. Pupils are equal, round, and reactive to light.  Neck: Neck supple. No tracheal deviation present. No thyromegaly present.  Cardiovascular: Normal rate and regular rhythm.   No murmur heard. Pulmonary/Chest: Effort normal and breath sounds normal.  Abdominal: Soft. Bowel sounds are normal. She exhibits mass. She exhibits no distension. There is tenderness. There is no rebound and no guarding.  Morbidly obese tender @ suprapubic area  Genitourinary:  No  externalc lesion, os closed, yellow d/c no blood in vault  Positive cervical motion tenderness and bilatno adnexal tenderness or masses  Musculoskeletal: Normal range of motion. She exhibits no edema and no tenderness.  Neurological: She is alert. Coordination normal.  Skin: Skin is warm and dry. No rash noted.  Psychiatric: She has a normal mood and affect.    ED Course  Procedures (including critical care time) DIAGNOSTIC STUDIES: Oxygen Saturation is 99% on RA, normal by my interpretation.    COORDINATION OF CARE:3:52 PM- Patient informed of current plan for treatment and evaluation and agrees with plan at this time.  Labs Review Labs Reviewed  WET PREP, GENITAL - Abnormal; Notable for the following:    Yeast Wet Prep HPF POC MODERATE (*)    WBC, Wet Prep HPF POC FEW (*)    All other components within normal limits  URINALYSIS, ROUTINE W REFLEX MICROSCOPIC - Abnormal; Notable for the following:    Specific Gravity, Urine 1.041 (*)    Glucose, UA >1000 (*)    All other components within normal limits  COMPREHENSIVE METABOLIC PANEL - Abnormal; Notable for the following:    Sodium 134 (*)    Glucose, Bld 414 (*)    Total Protein 8.4 (*)    Albumin 3.2 (*)    AST 68 (*)    ALT 103 (*)    Alkaline Phosphatase 185 (*)    GFR calc non Af Amer 90 (*)    All other components within  normal limits  GC/CHLAMYDIA PROBE AMP  PREGNANCY, URINE  CBC WITH DIFFERENTIAL  URINE MICROSCOPIC-ADD ON  RPR  HIV ANTIBODY (ROUTINE TESTING)    Imaging Review US Transvaginal Non-ob 08/21/2013   CLINICAL DATA:  Mid pelvic pain, radiating to bilateral lower quadrants for 3 weeks.  EXAM: TRANSABDOMINAL AND TRANSVAGINAL ULTRASOUND OF PELVIS  TECHNIQUE: Both transabdominal and transvaginal ultrasound examinations of the pelvis were performed. Transabdominal technique was performed for global imaging of the pelvis including uterus, ovaries, adnexal regions, and pelvic cul-de-sac. It was necessary to proceed with endovaginal exam following the transabdominal exam to visualize the uterus, ovaries, and adnexa.  COMPARISON:  Abdominal pelvic CT of 08/16/2013  FINDINGS: Uterus:  10.7 x 5.9 x 7.0 cm.  Normal in morphology.  Endometrium: Suboptimally visualized secondary to patient body habitus. Measures 1.9 cm maximally where  visualized.  Right Ovary: 4.2 x 1.9 x 2.5 cm. Normal in morphology. Suboptimally visualized secondary to high position in pelvis. Normal on recent CT.  Left Ovary:  2.9 x 2.2 x 2.8 cm.  Normal in morphology.  Other Findings:  No significant free fluid.  IMPRESSION: 1. Mild to moderate degradation secondary to patient body habitus and right ovary position. 2. Suspicion of endometrial thickening for age. Consider follow-up by [place] in 6-8 weeks, during the week immediately following menses (exam timing is critical).   Electronically Signed   By: Jeronimo Greaves M.D.   On: 08/21/2013 17:02   US Pelvis Complete 08/21/2013   CLINICAL DATA:  Mid pelvic pain, radiating to bilateral lower quadrants for 3 weeks.  EXAM: TRANSABDOMINAL AND TRANSVAGINAL ULTRASOUND OF PELVIS  TECHNIQUE: Both transabdominal and transvaginal ultrasound examinations of the pelvis were performed. Transabdominal technique was performed for global imaging of the pelvis including uterus, ovaries, adnexal regions, and pelvic  cul-de-sac. It was necessary to proceed with endovaginal exam following the transabdominal exam to visualize the uterus, ovaries, and adnexa.  COMPARISON:  Abdominal pelvic CT of 08/16/2013  FINDINGS: Uterus:  10.7 x 5.9 x 7.0 cm.  Normal in morphology.  Endometrium: Suboptimally visualized secondary to patient body habitus. Measures 1.9 cm maximally where visualized.  Right Ovary: 4.2 x 1.9 x 2.5 cm. Normal in morphology. Suboptimally visualized secondary to high position in pelvis. Normal on recent CT.  Left Ovary:  2.9 x 2.2 x 2.8 cm.  Normal in morphology.  Other Findings:  No significant free fluid.  IMPRESSION: 1. Mild to moderate degradation secondary to patient body habitus and right ovary position. 2. Suspicion of endometrial thickening for age. Consider follow-up by [place] in 6-8 weeks, during the week immediately following menses (exam timing is critical).   Electronically Signed   By: Jeronimo Greaves M.D.   On: 08/21/2013 17:02  Results for orders placed during the hospital encounter of 08/21/13  WET PREP, GENITAL      Result Value Ref Range   Yeast Wet Prep HPF POC MODERATE (*) NONE SEEN   Trich, Wet Prep NONE SEEN  NONE SEEN   Clue Cells Wet Prep HPF POC NONE SEEN  NONE SEEN   WBC, Wet Prep HPF POC FEW (*) NONE SEEN  URINALYSIS, ROUTINE W REFLEX MICROSCOPIC      Result Value Ref Range   Color, Urine YELLOW  YELLOW   APPearance CLEAR  CLEAR   Specific Gravity, Urine 1.041 (*) 1.005 - 1.030   pH 6.0  5.0 - 8.0   Glucose, UA >1000 (*) NEGATIVE mg/dL   Hgb urine dipstick NEGATIVE  NEGATIVE   Bilirubin Urine NEGATIVE  NEGATIVE   Ketones, ur NEGATIVE  NEGATIVE mg/dL   Protein, ur NEGATIVE  NEGATIVE mg/dL   Urobilinogen, UA 1.0  0.0 - 1.0 mg/dL   Nitrite NEGATIVE  NEGATIVE   Leukocytes, UA NEGATIVE  NEGATIVE  PREGNANCY, URINE      Result Value Ref Range   Preg Test, Ur NEGATIVE  NEGATIVE  COMPREHENSIVE METABOLIC PANEL      Result Value Ref Range   Sodium 134 (*) 137 - 147 mEq/L    Potassium 3.9  3.7 - 5.3 mEq/L   Chloride 97  96 - 112 mEq/L   CO2 23  19 - 32 mEq/L   Glucose, Bld 414 (*) 70 - 99 mg/dL   BUN 10  6 - 23 mg/dL   Creatinine, Ser 1.61  0.50 - 1.10 mg/dL  Calcium 9.5  8.4 - 10.5 mg/dL   Total Protein 8.4 (*) 6.0 - 8.3 g/dL   Albumin 3.2 (*) 3.5 - 5.2 g/dL   AST 68 (*) 0 - 37 U/L   ALT 103 (*) 0 - 35 U/L   Alkaline Phosphatase 185 (*) 39 - 117 U/L   Total Bilirubin 0.4  0.3 - 1.2 mg/dL   GFR calc non Af Amer 90 (*) >90 mL/min   GFR calc Af Amer >90  >90 mL/min   Anion gap 14  5 - 15  CBC WITH DIFFERENTIAL      Result Value Ref Range   WBC 9.3  4.0 - 10.5 K/uL   RBC 4.47  3.87 - 5.11 MIL/uL   Hemoglobin 12.7  12.0 - 15.0 g/dL   HCT 16.1  09.6 - 04.5 %   MCV 85.9  78.0 - 100.0 fL   MCH 28.4  26.0 - 34.0 pg   MCHC 33.1  30.0 - 36.0 g/dL   RDW 40.9  81.1 - 91.4 %   Platelets 223  150 - 400 K/uL   Neutrophils Relative % 50  43 - 77 %   Neutro Abs 4.7  1.7 - 7.7 K/uL   Lymphocytes Relative 40  12 - 46 %   Lymphs Abs 3.7  0.7 - 4.0 K/uL   Monocytes Relative 8  3 - 12 %   Monocytes Absolute 0.7  0.1 - 1.0 K/uL   Eosinophils Relative 2  0 - 5 %   Eosinophils Absolute 0.2  0.0 - 0.7 K/uL   Basophils Relative 1  0 - 1 %   Basophils Absolute 0.1  0.0 - 0.1 K/uL  URINE MICROSCOPIC-ADD ON      Result Value Ref Range   Squamous Epithelial / LPF RARE  RARE   Bacteria, UA RARE  RARE   Urine-Other FEW YEAST     US Transvaginal Non-ob  08/21/2013   CLINICAL DATA:  Mid pelvic pain, radiating to bilateral lower quadrants for 3 weeks.  EXAM: TRANSABDOMINAL AND TRANSVAGINAL ULTRASOUND OF PELVIS  TECHNIQUE: Both transabdominal and transvaginal ultrasound examinations of the pelvis were performed. Transabdominal technique was performed for global imaging of the pelvis including uterus, ovaries, adnexal regions, and pelvic cul-de-sac. It was necessary to proceed with endovaginal exam following the transabdominal exam to visualize the uterus, ovaries, and adnexa.   COMPARISON:  Abdominal pelvic CT of 08/16/2013  FINDINGS: Uterus:  10.7 x 5.9 x 7.0 cm.  Normal in morphology.  Endometrium: Suboptimally visualized secondary to patient body habitus. Measures 1.9 cm maximally where visualized.  Right Ovary: 4.2 x 1.9 x 2.5 cm. Normal in morphology. Suboptimally visualized secondary to high position in pelvis. Normal on recent CT.  Left Ovary:  2.9 x 2.2 x 2.8 cm.  Normal in morphology.  Other Findings:  No significant free fluid.  IMPRESSION: 1. Mild to moderate degradation secondary to patient body habitus and right ovary position. 2. Suspicion of endometrial thickening for age. Consider follow-up by [place] in 6-8 weeks, during the week immediately following menses (exam timing is critical).   Electronically Signed   By: Jeronimo Greaves M.D.   On: 08/21/2013 17:02   US Pelvis Complete  08/21/2013   CLINICAL DATA:  Mid pelvic pain, radiating to bilateral lower quadrants for 3 weeks.  EXAM: TRANSABDOMINAL AND TRANSVAGINAL ULTRASOUND OF PELVIS  TECHNIQUE: Both transabdominal and transvaginal ultrasound examinations of the pelvis were performed. Transabdominal technique was performed for global imaging of the pelvis  including uterus, ovaries, adnexal regions, and pelvic cul-de-sac. It was necessary to proceed with endovaginal exam following the transabdominal exam to visualize the uterus, ovaries, and adnexa.  COMPARISON:  Abdominal pelvic CT of 08/16/2013  FINDINGS: Uterus:  10.7 x 5.9 x 7.0 cm.  Normal in morphology.  Endometrium: Suboptimally visualized secondary to patient body habitus. Measures 1.9 cm maximally where visualized.  Right Ovary: 4.2 x 1.9 x 2.5 cm. Normal in morphology. Suboptimally visualized secondary to high position in pelvis. Normal on recent CT.  Left Ovary:  2.9 x 2.2 x 2.8 cm.  Normal in morphology.  Other Findings:  No significant free fluid.  IMPRESSION: 1. Mild to moderate degradation secondary to patient body habitus and right ovary position. 2. Suspicion of  endometrial thickening for age. Consider follow-up by [place] in 6-8 weeks, during the week immediately following menses (exam timing is critical).   Electronically Signed   By: Jeronimo GreavesKyle  Talbot M.D.   On: 08/21/2013 17:02   Ct Abdomen Pelvis W Contrast  08/16/2013   CLINICAL DATA:  Intermittent lower abdominal pain. Low back pain. Diarrhea.  EXAM: CT ABDOMEN AND PELVIS WITH CONTRAST  TECHNIQUE: Multidetector CT imaging of the abdomen and pelvis was performed using the standard protocol following bolus administration of intravenous contrast.  CONTRAST:  100mL OMNIPAQUE IOHEXOL 300 MG/ML  SOLN  COMPARISON:  No priors.  FINDINGS: Lung Bases: Unremarkable.  Abdomen/Pelvis: There are several noncalcified partially cavitary gallstones in the gallbladder, and in the neck of the gallbladder there is a densely calcified gallstone. Gallbladder appears moderately distended, filled with high attenuation material (likely biliary sludge). However, there is no pericholecystic fluid or surrounding inflammatory changes to strongly suggest an acute cholecystitis at this time. The liver has a very subtle nodular contour, suggestive of early changes of cirrhosis. No focal cystic or solid hepatic lesion. The appearance of the pancreas, spleen, bilateral adrenal glands and bilateral kidneys is unremarkable.  No significant volume of ascites. No pneumoperitoneum. No pathologic distention of small bowel. Normal appendix. No lymphadenopathy identified in the abdomen or pelvis. There are multiple borderline enlarged retroperitoneal lymph nodes which are conspicuous by the number rather than their size, however, these are nonspecific. Uterus and ovaries are unremarkable in appearance. Urinary bladder is normal in appearance.  Musculoskeletal: There are no aggressive appearing lytic or blastic lesions noted in the visualized portions of the skeleton.  IMPRESSION: 1. Cholelithiasis without findings to suggest an acute cholecystitis at this time.  However, the gallbladder is moderately distended, largely filled with a combination of gallstones and biliary sludge. 2. Early morphologic changes in the liver suggestive of cirrhosis. 3. Normal appendix.   Electronically Signed   By: Trudie Reedaniel  Entrikin M.D.   On: 08/16/2013 02:13   530 pm pain much improved afeer tx with percocet MDM  Patient diabetes since 2008. She has run out of the invokana and another oral agent that she takes for diabetes as a one week ago. She reports that she's done well with metformin. I will write for metformin. Final diagnoses:  None   elevated liver function tests are unchanged from 6 days ago. I doubt acute cholecystitis. She has no upper bowel tenderness. And pain is not affected by being. She did report the pain was increased with pelvic ultrasound. Plan referral women's health clinic. Prescription metformin. Prescription oxycodone. She is instructed to follow up with her PCP tomorrow Diagnosis #1 abdominal pain #2 pelvic pain #3yeast vaginitis #3 hyperglycemia #4 elevated liver function tests #5 hyperglycemia #6  medication noncompliance    I personally performed the services described in this documentation, which was scribed in my presence. The recorded information has been reviewed and considered. I personally performed the services described in this documentation, which was scribed in my presence. The recorded information has been reviewed and considered.  Doug Sou, MD 08/21/13 1743  Doug Sou, MD 08/21/13 1745

## 2013-08-21 NOTE — Discharge Instructions (Signed)
Abdominal Pain your blood sugar today was 414. Take for metformin prescribed starting tonight, along with your insulin as prescribed. Get a glucose monitor as soon as possible. [facility]. You should have a repeat pelvic ultrasound scheduled for 1 week after your next menstrual period. This can be arranged through the women's health center. Call your primary care physician tomorrow to arrange for medication refills and for office followup. Please tell him that you were here today. Many things can cause belly (abdominal) pain. Most times, the belly pain is not dangerous. Many cases of belly pain can be watched and treated at home. HOME CARE   Do not take medicines that help you go poop (laxatives) unless told to by your doctor.  Only take medicine as told by your doctor.  Eat or drink as told by your doctor. Your doctor will tell you if you should be on a special diet. GET HELP IF:  You do not know what is causing your belly pain.  You have belly pain while you are sick to your stomach (nauseous) or have runny poop (diarrhea).  You have pain while you pee or poop.  Your belly pain wakes you up at night.  You have belly pain that gets worse or better when you eat.  You have belly pain that gets worse when you eat fatty foods.  You have a fever. GET HELP RIGHT AWAY IF:   The pain does not go away within 2 hours.  You keep throwing up (vomiting).  The pain changes and is only in the right or left part of the belly.  You have bloody or tarry looking poop. MAKE SURE YOU:   Understand these instructions.  Will watch your condition.  Will get help right away if you are not doing well or get worse. Document Released: 06/11/2007 Document Revised: 12/28/2012 Document Reviewed: 09/01/2012 [facility] Patient Information 2015 [place], [place]. This information is not intended to replace advice given to you by your health care provider.  Make sure you discuss any questions you have with your health care provider.

## 2013-08-21 NOTE — ED Notes (Signed)
Patient states she his having abd pain that radiates to her back. Describes a "fluttering" feeling in her abd as well.

## 2013-08-23 LAB — GC/CHLAMYDIA PROBE AMP
CT Probe RNA: NEGATIVE
GC Probe RNA: NEGATIVE

## 2013-09-05 ENCOUNTER — Encounter (HOSPITAL_BASED_OUTPATIENT_CLINIC_OR_DEPARTMENT_OTHER): Payer: Self-pay | Admitting: Emergency Medicine

## 2013-09-05 ENCOUNTER — Emergency Department (HOSPITAL_BASED_OUTPATIENT_CLINIC_OR_DEPARTMENT_OTHER)
Admission: EM | Admit: 2013-09-05 | Discharge: 2013-09-06 | Disposition: A | Discharge status: Home / Self Care | Payer: Medicaid Other | Attending: Emergency Medicine | Admitting: Emergency Medicine

## 2013-09-05 DIAGNOSIS — Z9889 Other specified postprocedural states: Secondary | ICD-10-CM | POA: Insufficient documentation

## 2013-09-05 DIAGNOSIS — J441 Chronic obstructive pulmonary disease with (acute) exacerbation: Secondary | ICD-10-CM | POA: Diagnosis not present

## 2013-09-05 DIAGNOSIS — R071 Chest pain on breathing: Secondary | ICD-10-CM | POA: Insufficient documentation

## 2013-09-05 DIAGNOSIS — Z3202 Encounter for pregnancy test, result negative: Secondary | ICD-10-CM | POA: Insufficient documentation

## 2013-09-05 DIAGNOSIS — M542 Cervicalgia: Secondary | ICD-10-CM | POA: Diagnosis not present

## 2013-09-05 DIAGNOSIS — Z79899 Other long term (current) drug therapy: Secondary | ICD-10-CM | POA: Diagnosis not present

## 2013-09-05 DIAGNOSIS — M549 Dorsalgia, unspecified: Secondary | ICD-10-CM | POA: Insufficient documentation

## 2013-09-05 DIAGNOSIS — E119 Type 2 diabetes mellitus without complications: Secondary | ICD-10-CM | POA: Diagnosis not present

## 2013-09-05 DIAGNOSIS — R079 Chest pain, unspecified: Secondary | ICD-10-CM | POA: Insufficient documentation

## 2013-09-05 DIAGNOSIS — Z794 Long term (current) use of insulin: Secondary | ICD-10-CM | POA: Diagnosis not present

## 2013-09-05 DIAGNOSIS — R0789 Other chest pain: Secondary | ICD-10-CM

## 2013-09-05 DIAGNOSIS — B373 Candidiasis of vulva and vagina: Secondary | ICD-10-CM

## 2013-09-05 DIAGNOSIS — I10 Essential (primary) hypertension: Secondary | ICD-10-CM | POA: Diagnosis not present

## 2013-09-05 DIAGNOSIS — B3731 Acute candidiasis of vulva and vagina: Secondary | ICD-10-CM

## 2013-09-05 DIAGNOSIS — E669 Obesity, unspecified: Secondary | ICD-10-CM | POA: Diagnosis not present

## 2013-09-05 DIAGNOSIS — R1084 Generalized abdominal pain: Secondary | ICD-10-CM | POA: Diagnosis not present

## 2013-09-05 DIAGNOSIS — R739 Hyperglycemia, unspecified: Secondary | ICD-10-CM

## 2013-09-05 HISTORY — DX: Essential (primary) hypertension: I10

## 2013-09-05 NOTE — ED Notes (Signed)
Pt states she developed centralized chest pain today around 12 noon while walking at school, reports central chest pain with radiation to back, + neck pain, diaphoresis, nausea

## 2013-09-05 NOTE — ED Provider Notes (Signed)
CSN: [id number]     Arrival date & time 09/05/13  2338 History  This chart was scribed for Sheila Seamen, MD by Jarvis Morgan, ED Scribe. This patient was seen in room MH02/MH02 and the patient's care was started at 11:57 PM.   Chief Complaint  Patient presents with  . Chest Pain      The history is provided by the patient. No language interpreter was used.   HPI Comments: Sheila Gibson is a 43 y.o. female with a h/o COPD, DM, obesity and HTN, who presents to the Emergency Department complaining of constant, severe, "10/10", centralized chest pain onset 12 hours ago. Pt notes the pain radiates to her back. She reports that the pain began while she was walking today at school. She states that nothing seems to alleviate this pain. The pain is exacerbated by movement, breathing and palpation. She is having some neck pain and nausea. She denies dyspnea. She states she drank ginger ale and took some Tums with no relief. She denies any fever, diarrhea, vomiting, chills or dysuria.   She presented to the ED around 2 weeks ago for "sharp and pressure like" abdominal pain. Pt states that the pain is characterized like "labor pains". Nothing seems to alleviate the pain. She states that when she came on 8/16 they did a pelvic US and found her endometrium was thickened. Pt is still having the abdominal pain today and characterizes her pain as "9/10".   Past Medical History  Diagnosis Date  . Diabetes mellitus without complication   . COPD (chronic obstructive pulmonary disease)   . Obesity   . Hypertension    Past Surgical History  Procedure Laterality Date  . Thyroidectomy    . Cesarean section     History reviewed. No pertinent family history. History  Substance Use Topics  . Smoking status: Never Smoker   . Smokeless tobacco: Not on file  . Alcohol Use: No   OB History   Grav Para Term Preterm Abortions TAB SAB Ect Mult Living                 Review of Systems  Constitutional:  Positive for diaphoresis. Negative for fever and chills.  Respiratory: Positive for shortness of breath.   Cardiovascular: Positive for chest pain.  Gastrointestinal: Positive for nausea and abdominal pain. Negative for vomiting and diarrhea.  Genitourinary: Negative for dysuria.  Musculoskeletal: Positive for back pain and neck pain.  A complete 10 system review of systems was obtained and all systems are negative except as noted in the HPI and PMH.      Allergies  Review of patient's allergies indicates no known allergies.  Home Medications   Prior to Admission medications   Medication Sig Start Date End Date Taking? Authorizing Provider  insulin glargine (LANTUS) 100 unit/mL SOPN Inject 60 Units into the skin at bedtime.   Yes Historical Provider, MD  metFORMIN (GLUCOPHAGE) 500 MG tablet Take 1 tablet (500 mg total) by mouth 2 (two) times daily with a meal. 08/21/13  Yes Doug Sou, MD  fluconazole (DIFLUCAN) 150 MG tablet Take 150 mg by mouth once. Three day course    Historical Provider, MD  oxybutynin (DITROPAN) 5 MG tablet Take 5 mg by mouth 3 (three) times daily.    Historical Provider, MD  oxyCODONE (ROXICODONE) 5 MG immediate release tablet Take 1 tablet (5 mg total) by mouth every 4 (four) hours as needed for severe pain. 08/21/13   Doug Sou, MD  oxyCODONE-acetaminophen (PERCOCET) 5-325 MG per tablet Take 1 tablet by mouth every 4 (four) hours as needed for moderate pain. 08/16/13   Dione Booze, MD   Triage Vitals: BP 167/106  Pulse 102  Temp(Src) 99.2 F (37.3 C) (Oral)  Resp 24  Ht  (1.702 m)  Wt 343 lb (155.584 kg)  BMI 53.71 kg/m2  SpO2 98%  LMP 08/22/2013  Physical Exam General: Well-developed, morbidly obese female in no acute distress; appearance consistent with age of record HENT: normocephalic; atraumatic Eyes: pupils equal, round and reactive to light; extraocular muscles intact Neck: supple Heart: regular rate and rhythm; no murmurs, rubs or  gallops Lungs: clear to auscultation bilaterally Chest: bilateral parasternal tenderness. Abdomen: soft; nondistended; no masses or hepatosplenomegaly; bowel sounds present. Diffusely tender Extremities: No deformity; full range of motion; pulses normal. Trace edema of lower legs Neurologic: Awake, alert and oriented; motor function intact in all extremities and symmetric; no facial droop Skin: Warm and dry. Chronic appearing stasis change of lower legs Psychiatric: Normal mood and affect  ED Course  Procedures (including critical care time)  DIAGNOSTIC STUDIES: Oxygen Saturation is 98% on RA, normal by my interpretation.    COORDINATION OF CARE:    MDM    EKG Interpretation  Date/Time:  Monday September 05 2013 23:59:34 EDT Ventricular Rate:  98 PR Interval:  150 QRS Duration: 104 QT Interval:  398 QTC Calculation: 508 R Axis:   81 Text Interpretation:  Sinus rhythm with Premature atrial complexes Prolonged QT Abnormal ECG No old tracing to compare Confirmed by [facility]  MD, Jonny Ruiz ([id number]) on 09/06/2013 12:26:29 AM       Nursing notes and vitals signs, including pulse oximetry, reviewed.  Summary of this visit's results, reviewed by myself:  Labs:  Results for orders placed during the hospital encounter of 09/05/13 (from the past 24 hour(s))  URINALYSIS, ROUTINE W REFLEX MICROSCOPIC     Status: Abnormal   Collection Time    09/05/13 11:49 PM      Result Value Ref Range   Color, Urine YELLOW  YELLOW   APPearance CLEAR  CLEAR   Specific Gravity, Urine 1.039 (*) 1.005 - 1.030   pH 6.5  5.0 - 8.0   Glucose, UA >1000 (*) NEGATIVE mg/dL   Hgb urine dipstick NEGATIVE  NEGATIVE   Bilirubin Urine NEGATIVE  NEGATIVE   Ketones, ur NEGATIVE  NEGATIVE mg/dL   Protein, ur NEGATIVE  NEGATIVE mg/dL   Urobilinogen, UA 0.2  0.0 - 1.0 mg/dL   Nitrite NEGATIVE  NEGATIVE   Leukocytes, UA NEGATIVE  NEGATIVE  PREGNANCY, URINE     Status: None   Collection Time    09/05/13 11:49 PM       Result Value Ref Range   Preg Test, Ur NEGATIVE  NEGATIVE  URINE MICROSCOPIC-ADD ON     Status: Abnormal   Collection Time    09/05/13 11:49 PM      Result Value Ref Range   Squamous Epithelial / LPF FEW (*) RARE   WBC, UA 0-2  <3 WBC/hpf   RBC / HPF 0-2  <3 RBC/hpf   Bacteria, UA RARE  RARE   Urine-Other RARE YEAST    TROPONIN I     Status: None   Collection Time    09/06/13 12:30 AM      Result Value Ref Range   Troponin I <0.30  <0.30 ng/mL  CBC WITH DIFFERENTIAL     Status: None   Collection Time  09/06/13 12:30 AM      Result Value Ref Range   WBC 8.2  4.0 - 10.5 K/uL   RBC 4.28  3.87 - 5.11 MIL/uL   Hemoglobin 12.1  12.0 - 15.0 g/dL   HCT 96.0  45.4 - 09.8 %   MCV 87.4  78.0 - 100.0 fL   MCH 28.3  26.0 - 34.0 pg   MCHC 32.4  30.0 - 36.0 g/dL   RDW 11.9  14.7 - 82.9 %   Platelets 206  150 - 400 K/uL   Neutrophils Relative % 50  43 - 77 %   Neutro Abs 4.2  1.7 - 7.7 K/uL   Lymphocytes Relative 36  12 - 46 %   Lymphs Abs 2.9  0.7 - 4.0 K/uL   Monocytes Relative 11  3 - 12 %   Monocytes Absolute 0.9  0.1 - 1.0 K/uL   Eosinophils Relative 2  0 - 5 %   Eosinophils Absolute 0.1  0.0 - 0.7 K/uL   Basophils Relative 1  0 - 1 %   Basophils Absolute 0.0  0.0 - 0.1 K/uL  COMPREHENSIVE METABOLIC PANEL     Status: Abnormal   Collection Time    09/06/13 12:30 AM      Result Value Ref Range   Sodium 133 (*) 137 - 147 mEq/L   Potassium 3.6 (*) 3.7 - 5.3 mEq/L   Chloride 96  96 - 112 mEq/L   CO2 23  19 - 32 mEq/L   Glucose, Bld 553 (*) 70 - 99 mg/dL   BUN 8  6 - 23 mg/dL   Creatinine, Ser 5.62  0.50 - 1.10 mg/dL   Calcium 8.8  8.4 - 13.0 mg/dL   Total Protein 7.8  6.0 - 8.3 g/dL   Albumin 3.1 (*) 3.5 - 5.2 g/dL   AST 51 (*) 0 - 37 U/L   ALT 69 (*) 0 - 35 U/L   Alkaline Phosphatase 161 (*) 39 - 117 U/L   Total Bilirubin 0.2 (*) 0.3 - 1.2 mg/dL   GFR calc non Af Amer >90  >90 mL/min   GFR calc Af Amer >90  >90 mL/min   Anion gap 14  5 - 15  LIPASE, BLOOD     Status:  None   Collection Time    09/06/13 12:30 AM      Result Value Ref Range   Lipase 26  11 - 59 U/L  CBG MONITORING, ED     Status: Abnormal   Collection Time    09/06/13  3:12 AM      Result Value Ref Range   Glucose-Capillary 435 (*) 70 - 99 mg/dL  CBG MONITORING, ED     Status: Abnormal   Collection Time    09/06/13  4:05 AM      Result Value Ref Range   Glucose-Capillary 388 (*) 70 - 99 mg/dL  TROPONIN I     Status: None   Collection Time    09/06/13  4:37 AM      Result Value Ref Range   Troponin I <0.30  <0.30 ng/mL  CBG MONITORING, ED     Status: Abnormal   Collection Time    09/06/13  5:09 AM      Result Value Ref Range   Glucose-Capillary 311 (*) 70 - 99 mg/dL  CBG MONITORING, ED     Status: Abnormal   Collection Time    09/06/13  6:02 AM  Result Value Ref Range   Glucose-Capillary 269 (*) 70 - 99 mg/dL    Imaging Studies: Dg Chest 2 View  09/06/2013   CLINICAL DATA:  chest pain  EXAM: CHEST  2 VIEW  COMPARISON:  Prior radiograph from 12/27/2012  FINDINGS: The cardiac and mediastinal silhouettes are stable in size and contour, and remain within normal limits.  The lungs are normally inflated. No airspace consolidation, pleural effusion, or pulmonary edema is identified. There is no pneumothorax.  No acute osseous abnormality identified.  IMPRESSION: No active cardiopulmonary disease.   Electronically Signed   By: Rise Mu M.D.   On: 09/06/2013 00:58   4:16 AM Patient continues on insulin drip per Glucose Stabilizer, sugar down to 388. Her chest pain was controlled with fentanyl but is starting to return. Her chest wall is still tender to palpation.  6:05 AM Sugar down to 269. Patient advised to contact her PCP regarding her sugar control as well as ongoing pain.    I personally performed the services described in this documentation, which was scribed in my presence. The recorded information has been reviewed and is accurate.   Sheila Seamen,  MD 09/06/13 [phone]

## 2013-09-06 ENCOUNTER — Emergency Department (HOSPITAL_BASED_OUTPATIENT_CLINIC_OR_DEPARTMENT_OTHER): Payer: Medicaid Other

## 2013-09-06 LAB — URINALYSIS, ROUTINE W REFLEX MICROSCOPIC
BILIRUBIN URINE: NEGATIVE
Glucose, UA: >1000 mg/dL — AB
Hgb urine dipstick: NEGATIVE
KETONES UR: NEGATIVE mg/dL
LEUKOCYTES UA: NEGATIVE
Nitrite: NEGATIVE
PH: 6.5 (ref 5.0–8.0)
PROTEIN: NEGATIVE mg/dL
Specific Gravity, Urine: 1.039 — ABNORMAL HIGH (ref 1.005–1.030)
Urobilinogen, UA: 0.2 mg/dL (ref 0.0–1.0)

## 2013-09-06 LAB — CBG MONITORING, ED
GLUCOSE-CAPILLARY: 311 mg/dL — AB (ref 70–99)
GLUCOSE-CAPILLARY: 269 mg/dL — AB (ref 70–99)
Glucose-Capillary: 435 mg/dL — ABNORMAL HIGH (ref 70–99)
Glucose-Capillary: 388 mg/dL — ABNORMAL HIGH (ref 70–99)

## 2013-09-06 LAB — COMPREHENSIVE METABOLIC PANEL
ALBUMIN: 3.1 g/dL — AB (ref 3.5–5.2)
ALK PHOS: 161 U/L — AB (ref 39–117)
ALT: 69 U/L — AB (ref 0–35)
AST: 51 U/L — AB (ref 0–37)
Anion gap: 14 (ref 5–15)
BILIRUBIN TOTAL: 0.2 mg/dL — AB (ref 0.3–1.2)
BUN: 8 mg/dL (ref 6–23)
CO2: 23 meq/L (ref 19–32)
Calcium: 8.8 mg/dL (ref 8.4–10.5)
Chloride: 96 mEq/L (ref 96–112)
Creatinine, Ser: 0.7 mg/dL (ref 0.50–1.10)
GFR calc Af Amer: >90 mL/min (ref >90)
Glucose, Bld: 553 mg/dL (ref 70–99)
POTASSIUM: 3.6 meq/L — AB (ref 3.7–5.3)
Sodium: 133 mEq/L — ABNORMAL LOW (ref 137–147)
Total Protein: 7.8 g/dL (ref 6.0–8.3)

## 2013-09-06 LAB — CBC WITH DIFFERENTIAL/PLATELET
BASOS ABS: 0 10*3/uL (ref 0.0–0.1)
BASOS PCT: 1 % (ref 0–1)
Eosinophils Absolute: 0.1 10*3/uL (ref 0.0–0.7)
Eosinophils Relative: 2 % (ref 0–5)
HEMATOCRIT: 37.4 % (ref 36.0–46.0)
HEMOGLOBIN: 12.1 g/dL (ref 12.0–15.0)
Lymphocytes Relative: 36 % (ref 12–46)
Lymphs Abs: 2.9 10*3/uL (ref 0.7–4.0)
MCH: 28.3 pg (ref 26.0–34.0)
MCHC: 32.4 g/dL (ref 30.0–36.0)
MCV: 87.4 fL (ref 78.0–100.0)
MONO ABS: 0.9 10*3/uL (ref 0.1–1.0)
Monocytes Relative: 11 % (ref 3–12)
NEUTROS PCT: 50 % (ref 43–77)
Neutro Abs: 4.2 10*3/uL (ref 1.7–7.7)
Platelets: 206 10*3/uL (ref 150–400)
RBC: 4.28 MIL/uL (ref 3.87–5.11)
RDW: 13.9 % (ref 11.5–15.5)
WBC: 8.2 10*3/uL (ref 4.0–10.5)

## 2013-09-06 LAB — LIPASE, BLOOD: Lipase: 26 U/L (ref 11–59)

## 2013-09-06 LAB — TROPONIN I

## 2013-09-06 LAB — URINE MICROSCOPIC-ADD ON

## 2013-09-06 LAB — PREGNANCY, URINE: Preg Test, Ur: NEGATIVE

## 2013-09-06 MED ORDER — SODIUM CHLORIDE 0.9 % IV BOLUS (SEPSIS)
1000.0000 mL | Freq: Once | INTRAVENOUS | Status: AC
  Administered 2013-09-06: 1000 mL via INTRAVENOUS

## 2013-09-06 MED ORDER — SODIUM CHLORIDE 0.9 % IV SOLN
INTRAVENOUS | Status: DC
  Administered 2013-09-06: 01:00:00 via INTRAVENOUS

## 2013-09-06 MED ORDER — FENTANYL CITRATE 0.05 MG/ML IJ SOLN
100.0000 ug | Freq: Once | INTRAMUSCULAR | Status: AC
  Administered 2013-09-06: 100 ug via INTRAVENOUS
  Filled 2013-09-06: qty 2

## 2013-09-06 MED ORDER — FLUCONAZOLE 50 MG PO TABS
150.0000 mg | ORAL_TABLET | Freq: Once | ORAL | Status: AC
  Administered 2013-09-06: 150 mg via ORAL
  Filled 2013-09-06 (×2): qty 1

## 2013-09-06 MED ORDER — ONDANSETRON HCL 4 MG/2ML IJ SOLN: 4.0000 mg | Freq: Once | INTRAMUSCULAR | Status: DC

## 2013-09-06 MED ORDER — SODIUM CHLORIDE 0.9 % IV SOLN
INTRAVENOUS | Status: DC
  Administered 2013-09-06: 4.9 [IU]/h via INTRAVENOUS

## 2013-09-06 MED ORDER — OXYCODONE-ACETAMINOPHEN 5-325 MG PO TABS: 1.0000 | ORAL_TABLET | ORAL | Status: DC | PRN

## 2013-09-06 NOTE — ED Notes (Signed)
EDP notified critical lab glucose [phone]

## 2013-09-28 ENCOUNTER — Encounter (HOSPITAL_BASED_OUTPATIENT_CLINIC_OR_DEPARTMENT_OTHER): Payer: Self-pay | Admitting: Emergency Medicine

## 2013-09-28 ENCOUNTER — Emergency Department (HOSPITAL_BASED_OUTPATIENT_CLINIC_OR_DEPARTMENT_OTHER)
Admission: EM | Admit: 2013-09-28 | Discharge: 2013-09-28 | Disposition: A | Discharge status: Home / Self Care | Payer: Medicaid Other | Attending: Emergency Medicine | Admitting: Emergency Medicine

## 2013-09-28 ENCOUNTER — Emergency Department (HOSPITAL_BASED_OUTPATIENT_CLINIC_OR_DEPARTMENT_OTHER): Payer: Medicaid Other

## 2013-09-28 DIAGNOSIS — J449 Chronic obstructive pulmonary disease, unspecified: Secondary | ICD-10-CM | POA: Insufficient documentation

## 2013-09-28 DIAGNOSIS — N898 Other specified noninflammatory disorders of vagina: Secondary | ICD-10-CM | POA: Insufficient documentation

## 2013-09-28 DIAGNOSIS — E119 Type 2 diabetes mellitus without complications: Secondary | ICD-10-CM | POA: Diagnosis not present

## 2013-09-28 DIAGNOSIS — E669 Obesity, unspecified: Secondary | ICD-10-CM | POA: Insufficient documentation

## 2013-09-28 DIAGNOSIS — K029 Dental caries, unspecified: Secondary | ICD-10-CM | POA: Diagnosis not present

## 2013-09-28 DIAGNOSIS — N949 Unspecified condition associated with female genital organs and menstrual cycle: Secondary | ICD-10-CM | POA: Diagnosis not present

## 2013-09-28 DIAGNOSIS — I10 Essential (primary) hypertension: Secondary | ICD-10-CM | POA: Diagnosis not present

## 2013-09-28 DIAGNOSIS — R102 Pelvic and perineal pain: Secondary | ICD-10-CM

## 2013-09-28 DIAGNOSIS — R109 Unspecified abdominal pain: Secondary | ICD-10-CM | POA: Insufficient documentation

## 2013-09-28 DIAGNOSIS — Z79899 Other long term (current) drug therapy: Secondary | ICD-10-CM | POA: Insufficient documentation

## 2013-09-28 DIAGNOSIS — K089 Disorder of teeth and supporting structures, unspecified: Secondary | ICD-10-CM | POA: Diagnosis not present

## 2013-09-28 DIAGNOSIS — Z794 Long term (current) use of insulin: Secondary | ICD-10-CM | POA: Diagnosis not present

## 2013-09-28 DIAGNOSIS — Z3202 Encounter for pregnancy test, result negative: Secondary | ICD-10-CM | POA: Diagnosis not present

## 2013-09-28 DIAGNOSIS — J4489 Other specified chronic obstructive pulmonary disease: Secondary | ICD-10-CM | POA: Insufficient documentation

## 2013-09-28 LAB — URINE MICROSCOPIC-ADD ON

## 2013-09-28 LAB — URINALYSIS, ROUTINE W REFLEX MICROSCOPIC
BILIRUBIN URINE: NEGATIVE
Ketones, ur: NEGATIVE mg/dL
Leukocytes, UA: NEGATIVE
NITRITE: NEGATIVE
PH: 5.5 (ref 5.0–8.0)
Protein, ur: NEGATIVE mg/dL
Specific Gravity, Urine: 1.043 — ABNORMAL HIGH (ref 1.005–1.030)
Urobilinogen, UA: 0.2 mg/dL (ref 0.0–1.0)

## 2013-09-28 LAB — WET PREP, GENITAL
Clue Cells Wet Prep HPF POC: NONE SEEN
Trich, Wet Prep: NONE SEEN
Yeast Wet Prep HPF POC: NONE SEEN

## 2013-09-28 LAB — PREGNANCY, URINE: Preg Test, Ur: NEGATIVE

## 2013-09-28 LAB — RAPID STREP SCREEN (MED CTR MEBANE ONLY): Streptococcus, Group A Screen (Direct): NEGATIVE

## 2013-09-28 MED ORDER — PENICILLIN V POTASSIUM 500 MG PO TABS: 500.0000 mg | ORAL_TABLET | Freq: Four times a day (QID) | ORAL | Status: AC

## 2013-09-28 MED ORDER — DICYCLOMINE HCL 20 MG PO TABS: 20.0000 mg | ORAL_TABLET | Freq: Two times a day (BID) | ORAL | Status: DC

## 2013-09-28 MED ORDER — DICYCLOMINE HCL 10 MG/ML IM SOLN
INTRAMUSCULAR | Status: AC
  Filled 2013-09-28: qty 2

## 2013-09-28 MED ORDER — DICYCLOMINE HCL 10 MG/ML IM SOLN
20.0000 mg | Freq: Once | INTRAMUSCULAR | Status: AC
  Administered 2013-09-28: 20 mg via INTRAMUSCULAR

## 2013-09-28 MED ORDER — PENICILLIN V POTASSIUM 250 MG PO TABS
ORAL_TABLET | ORAL | Status: AC
  Filled 2013-09-28: qty 2

## 2013-09-28 MED ORDER — PENICILLIN V POTASSIUM 250 MG PO TABS
500.0000 mg | ORAL_TABLET | Freq: Once | ORAL | Status: AC
  Administered 2013-09-28: 500 mg via ORAL

## 2013-09-28 MED ORDER — KETOROLAC TROMETHAMINE 60 MG/2ML IM SOLN
60.0000 mg | Freq: Once | INTRAMUSCULAR | Status: AC
  Administered 2013-09-28: 60 mg via INTRAMUSCULAR

## 2013-09-28 MED ORDER — FLUCONAZOLE 150 MG PO TABS: 150.0000 mg | ORAL_TABLET | Freq: Once | ORAL | Status: DC

## 2013-09-28 MED ORDER — KETOROLAC TROMETHAMINE 60 MG/2ML IM SOLN
INTRAMUSCULAR | Status: DC
Start: 2013-09-28 — End: 2013-09-28
  Filled 2013-09-28: qty 2

## 2013-09-28 MED ORDER — MELOXICAM 15 MG PO TABS: 15.0000 mg | ORAL_TABLET | Freq: Every day | ORAL | Status: DC

## 2013-09-28 NOTE — ED Provider Notes (Signed)
CSN: [id number]     Arrival date & time 09/28/13  4098 History   First MD Initiated Contact with Patient 09/28/13 [phone]     Chief Complaint  Patient presents with  . Abdominal Pain     (Consider location/radiation/quality/duration/timing/severity/associated sxs/prior Treatment) Patient is a 43 y.o. female presenting with abdominal pain and tooth pain. The history is provided by the patient.  Abdominal Pain Pain location:  Suprapubic Pain quality: cramping   Pain radiates to:  Does not radiate Pain severity:  Severe Onset quality:  Sudden Duration:  1 hour Timing:  Constant Progression:  Unchanged Chronicity:  New Context: not eating and not trauma   Relieved by:  Nothing Worsened by:  Nothing tried Ineffective treatments:  None tried Associated symptoms: vaginal bleeding   Associated symptoms: no diarrhea, no fever, no nausea and no vomiting   Associated symptoms comment:  Has her period that started 2 days ago Dental Pain Location:  Lower Lower teeth location:  18/LL 2nd molar and 19/LL 1st molar Quality:  Aching Severity:  Severe Onset quality:  Sudden Timing:  Constant Progression:  Unchanged Chronicity:  New Context: dental caries   Relieved by:  Nothing Worsened by:  Nothing tried Ineffective treatments:  None tried Associated symptoms: facial pain   Associated symptoms: no difficulty swallowing, no facial swelling and no fever   Risk factors: diabetes   Moreover also here to be seen for sore throat that started yesterday now left jaw and lower face hurt.  Has dental caries and is supposed to see a dentist in 2 weeks for same.  Is not currently taking an antibiotic.  Has not had her repeat ultrasound  Past Medical History  Diagnosis Date  . Diabetes mellitus without complication   . COPD (chronic obstructive pulmonary disease)   . Obesity   . Hypertension    Past Surgical History  Procedure Laterality Date  . Thyroidectomy    . Cesarean section      History reviewed. No pertinent family history. History  Substance Use Topics  . Smoking status: Never Smoker   . Smokeless tobacco: Not on file  . Alcohol Use: No   OB History   Grav Para Term Preterm Abortions TAB SAB Ect Mult Living                 Review of Systems  Constitutional: Negative for fever.  HENT: Negative for facial swelling, trouble swallowing and voice change.   Gastrointestinal: Positive for abdominal pain. Negative for nausea, vomiting and diarrhea.  Genitourinary: Positive for vaginal bleeding and pelvic pain.  All other systems reviewed and are negative.     Allergies  Review of patient's allergies indicates no known allergies.  Home Medications   Prior to Admission medications   Medication Sig Start Date End Date Taking? Authorizing Provider  Canagliflozin (INVOKANA) 100 MG TABS Take by mouth.   Yes Historical Provider, MD  insulin glargine (LANTUS) 100 unit/mL SOPN Inject 60 Units into the skin at bedtime.   Yes Historical Provider, MD  metFORMIN (GLUCOPHAGE) 500 MG tablet Take 1 tablet (500 mg total) by mouth 2 (two) times daily with a meal. 08/21/13  Yes Doug Sou, MD  oxybutynin (DITROPAN) 5 MG tablet Take 5 mg by mouth 3 (three) times daily.   Yes Historical Provider, MD  fluconazole (DIFLUCAN) 150 MG tablet Take 150 mg by mouth once. Three day course    Historical Provider, MD  oxyCODONE (ROXICODONE) 5 MG immediate release tablet Take 1 tablet (  5 mg total) by mouth every 4 (four) hours as needed for severe pain. 08/21/13   Doug Sou, MD  oxyCODONE-acetaminophen (PERCOCET) 5-325 MG per tablet Take 1 tablet by mouth every 4 (four) hours as needed for moderate pain. 09/06/13   John L Molpus, MD   BP 156/109  Pulse 94  Temp(Src) 98.5 F (36.9 C) (Oral)  Resp 24  Ht  (1.702 m)  Wt 345 lb (156.491 kg)  BMI 54.02 kg/m2  SpO2 98%  LMP 09/22/2013 Physical Exam  Constitutional: She appears well-developed and well-nourished. No distress.   HENT:  Head: Normocephalic and atraumatic.  Mouth/Throat: Oropharynx is clear and moist. No oropharyngeal exudate.    Eyes: Conjunctivae are normal. Pupils are equal, round, and reactive to light.  Neck: Normal range of motion. Neck supple. No tracheal deviation present.  No pain with displacement of the trachea  Cardiovascular: Normal rate, regular rhythm and intact distal pulses.   Pulmonary/Chest: Effort normal and breath sounds normal. No stridor. She has no wheezes. She has no rales.  Abdominal: Soft. Bowel sounds are increased. There is no tenderness. There is no rebound and no guarding.  Genitourinary: Cervix exhibits no motion tenderness, no discharge and no friability.  Scant vaginal bleeding chaperone present  Lymphadenopathy:    She has no cervical adenopathy.    ED Course  Procedures (including critical care time) Labs Review Labs Reviewed  RAPID STREP SCREEN  CULTURE, GROUP A STREP  WET PREP, GENITAL  GC/CHLAMYDIA PROBE AMP  URINALYSIS, ROUTINE W REFLEX MICROSCOPIC  PREGNANCY, URINE    Imaging Review No results found.   EKG Interpretation None      MDM   Final diagnoses:  None  Airway is widely patient, no fevers no swelling and strep is negative.    There is no indication to repeat HIV or RPR at this time as they were just performed in August.   Seen multiple times for similar.    Suspect pain if from dental caries.  Will treat with Penicillin VK and give dentistry follow up.  Patient has a lot of abdominal gas will treat.  Will refer to [facility] for follow up care.    Jasmine Awe, MD 09/28/13 [phone]

## 2013-09-28 NOTE — ED Notes (Signed)
Pt states that she developed sore throat yesterday, went to bed, this am when she woke up her left side of her face and stomach was hurting

## 2013-09-29 LAB — GC/CHLAMYDIA PROBE AMP
CT Probe RNA: NEGATIVE
GC Probe RNA: NEGATIVE

## 2013-09-30 LAB — CULTURE, GROUP A STREP

## 2013-10-10 ENCOUNTER — Encounter (HOSPITAL_BASED_OUTPATIENT_CLINIC_OR_DEPARTMENT_OTHER): Payer: Self-pay | Admitting: Emergency Medicine

## 2013-10-10 ENCOUNTER — Emergency Department (HOSPITAL_BASED_OUTPATIENT_CLINIC_OR_DEPARTMENT_OTHER)
Admission: EM | Admit: 2013-10-10 | Discharge: 2013-10-10 | Disposition: A | Discharge status: Home / Self Care | Payer: Medicaid Other | Attending: Emergency Medicine | Admitting: Emergency Medicine

## 2013-10-10 ENCOUNTER — Emergency Department (HOSPITAL_BASED_OUTPATIENT_CLINIC_OR_DEPARTMENT_OTHER): Payer: Medicaid Other

## 2013-10-10 DIAGNOSIS — E1165 Type 2 diabetes mellitus with hyperglycemia: Secondary | ICD-10-CM | POA: Insufficient documentation

## 2013-10-10 DIAGNOSIS — R739 Hyperglycemia, unspecified: Secondary | ICD-10-CM

## 2013-10-10 DIAGNOSIS — N390 Urinary tract infection, site not specified: Secondary | ICD-10-CM | POA: Insufficient documentation

## 2013-10-10 DIAGNOSIS — E669 Obesity, unspecified: Secondary | ICD-10-CM | POA: Insufficient documentation

## 2013-10-10 DIAGNOSIS — Z3202 Encounter for pregnancy test, result negative: Secondary | ICD-10-CM | POA: Insufficient documentation

## 2013-10-10 DIAGNOSIS — I10 Essential (primary) hypertension: Secondary | ICD-10-CM | POA: Insufficient documentation

## 2013-10-10 DIAGNOSIS — R109 Unspecified abdominal pain: Secondary | ICD-10-CM | POA: Diagnosis present

## 2013-10-10 DIAGNOSIS — J449 Chronic obstructive pulmonary disease, unspecified: Secondary | ICD-10-CM | POA: Diagnosis not present

## 2013-10-10 DIAGNOSIS — Z9889 Other specified postprocedural states: Secondary | ICD-10-CM | POA: Insufficient documentation

## 2013-10-10 LAB — CBC WITH DIFFERENTIAL/PLATELET
BLASTS: 0 %
Band Neutrophils: 0 % (ref 0–10)
Basophils Absolute: 0.1 10*3/uL (ref 0.0–0.1)
Basophils Relative: 1 % (ref 0–1)
EOS ABS: 0 10*3/uL (ref 0.0–0.7)
EOS PCT: 0 % (ref 0–5)
HCT: 39.2 % (ref 36.0–46.0)
Hemoglobin: 13.1 g/dL (ref 12.0–15.0)
Lymphocytes Relative: 27 % (ref 12–46)
Lymphs Abs: 3.1 10*3/uL (ref 0.7–4.0)
MCH: 29.2 pg (ref 26.0–34.0)
MCHC: 33.4 g/dL (ref 30.0–36.0)
MCV: 87.3 fL (ref 78.0–100.0)
MONO ABS: 0.2 10*3/uL (ref 0.1–1.0)
MONOS PCT: 2 % — AB (ref 3–12)
Metamyelocytes Relative: 0 %
Myelocytes: 0 %
NEUTROS ABS: 8.1 10*3/uL — AB (ref 1.7–7.7)
NRBC: 0 /100{WBCs}
Neutrophils Relative %: 70 % (ref 43–77)
PLATELETS: 193 10*3/uL (ref 150–400)
Promyelocytes Absolute: 0 %
RBC: 4.49 MIL/uL (ref 3.87–5.11)
RDW: 13.8 % (ref 11.5–15.5)
WBC: 11.5 10*3/uL — AB (ref 4.0–10.5)

## 2013-10-10 LAB — COMPREHENSIVE METABOLIC PANEL
ALBUMIN: 3.1 g/dL — AB (ref 3.5–5.2)
ALT: 155 U/L — ABNORMAL HIGH (ref 0–35)
AST: 107 U/L — ABNORMAL HIGH (ref 0–37)
Alkaline Phosphatase: 243 U/L — ABNORMAL HIGH (ref 39–117)
Anion gap: 13 (ref 5–15)
BUN: 8 mg/dL (ref 6–23)
CO2: 25 mEq/L (ref 19–32)
CREATININE: 0.6 mg/dL (ref 0.50–1.10)
Calcium: 8.7 mg/dL (ref 8.4–10.5)
Chloride: 95 mEq/L — ABNORMAL LOW (ref 96–112)
GFR calc non Af Amer: >90 mL/min (ref >90)
GLUCOSE: 444 mg/dL — AB (ref 70–99)
Potassium: 3.9 mEq/L (ref 3.7–5.3)
Sodium: 133 mEq/L — ABNORMAL LOW (ref 137–147)
Total Bilirubin: 1 mg/dL (ref 0.3–1.2)
Total Protein: 8.2 g/dL (ref 6.0–8.3)

## 2013-10-10 LAB — URINALYSIS, ROUTINE W REFLEX MICROSCOPIC
Bilirubin Urine: NEGATIVE
Glucose, UA: >1000 mg/dL — AB
KETONES UR: NEGATIVE mg/dL
NITRITE: NEGATIVE
Protein, ur: NEGATIVE mg/dL
SPECIFIC GRAVITY, URINE: 1.045 — AB (ref 1.005–1.030)
UROBILINOGEN UA: 1 mg/dL (ref 0.0–1.0)
pH: 6 (ref 5.0–8.0)

## 2013-10-10 LAB — PREGNANCY, URINE: Preg Test, Ur: NEGATIVE

## 2013-10-10 LAB — OCCULT BLOOD X 1 CARD TO LAB, STOOL: FECAL OCCULT BLD: NEGATIVE

## 2013-10-10 LAB — URINE MICROSCOPIC-ADD ON

## 2013-10-10 LAB — LIPASE, BLOOD: LIPASE: 41 U/L (ref 11–59)

## 2013-10-10 MED ORDER — IOHEXOL 300 MG/ML  SOLN
25.0000 mL | Freq: Once | INTRAMUSCULAR | Status: AC | PRN
  Administered 2013-10-10: 25 mL via ORAL

## 2013-10-10 MED ORDER — SODIUM CHLORIDE 0.9 % IV BOLUS (SEPSIS)
1000.0000 mL | Freq: Once | INTRAVENOUS | Status: AC
  Administered 2013-10-10: 1000 mL via INTRAVENOUS

## 2013-10-10 MED ORDER — DEXTROSE 5 % IV SOLN: 1.0000 g | Freq: Once | INTRAVENOUS | Status: AC

## 2013-10-10 MED ORDER — ONDANSETRON HCL 4 MG/2ML IJ SOLN
4.0000 mg | Freq: Once | INTRAMUSCULAR | Status: AC
  Administered 2013-10-10: 4 mg via INTRAVENOUS
  Filled 2013-10-10: qty 2

## 2013-10-10 MED ORDER — CEFTRIAXONE SODIUM 1 G IJ SOLR
INTRAMUSCULAR | Status: AC
  Administered 2013-10-10: 1000 mg
  Filled 2013-10-10: qty 10

## 2013-10-10 MED ORDER — HYDROMORPHONE HCL 1 MG/ML IJ SOLN
1.0000 mg | Freq: Once | INTRAMUSCULAR | Status: AC
  Administered 2013-10-10: 1 mg via INTRAVENOUS
  Filled 2013-10-10: qty 1

## 2013-10-10 MED ORDER — IOHEXOL 300 MG/ML  SOLN
100.0000 mL | Freq: Once | INTRAMUSCULAR | Status: AC | PRN
  Administered 2013-10-10: 100 mL via INTRAVENOUS

## 2013-10-10 MED ORDER — CEPHALEXIN 500 MG PO CAPS: 500.0000 mg | ORAL_CAPSULE | Freq: Four times a day (QID) | ORAL | Status: DC

## 2013-10-10 MED ORDER — ONDANSETRON 8 MG PO TBDP: 8.0000 mg | ORAL_TABLET | Freq: Three times a day (TID) | ORAL | Status: DC | PRN

## 2013-10-10 NOTE — ED Notes (Signed)
Pt directed to pharmacy to pick up prescriptions- verbalizes understanding to not take metformin x 48 hours

## 2013-10-10 NOTE — ED Provider Notes (Signed)
CSN: [id number]     Arrival date & time 10/10/13  0950 History   First MD Initiated Contact with Patient 10/10/13 1020     Chief Complaint  Patient presents with  . Abdominal Pain     (Consider location/radiation/quality/duration/timing/severity/associated sxs/prior Treatment) HPI 43 y.o female presents today complaining of onset of acute diffuse crampy abdominal pain that is central and radiates to bilateral flanks.  Pain is constant at 7/10.  She has nausea but no vomiting, fever, chills, uti symptoms, or vaginal discharge.  She does not drink alcohol.  Past Medical History  Diagnosis Date  . Diabetes mellitus without complication   . COPD (chronic obstructive pulmonary disease)   . Obesity   . Hypertension    Past Surgical History  Procedure Laterality Date  . Thyroidectomy    . Cesarean section     No family history on file. History  Substance Use Topics  . Smoking status: Never Smoker   . Smokeless tobacco: Not on file  . Alcohol Use: No   OB History   Grav Para Term Preterm Abortions TAB SAB Ect Mult Living                 Review of Systems  All other systems reviewed and are negative.     Allergies  Review of patient's allergies indicates no known allergies.  Home Medications   Prior to Admission medications   Medication Sig Start Date End Date Taking? Authorizing Provider  Canagliflozin (INVOKANA) 100 MG TABS Take by mouth.    Historical Provider, MD  cephALEXin (KEFLEX) 500 MG capsule Take 1 capsule (500 mg total) by mouth 4 (four) times daily. 10/10/13   Hilario Quarryanielle S Alayah Knouff, MD  insulin glargine (LANTUS) 100 unit/mL SOPN Inject 60 Units into the skin at bedtime.    Historical Provider, MD  metFORMIN (GLUCOPHAGE) 500 MG tablet Take 1 tablet (500 mg total) by mouth 2 (two) times daily with a meal. 08/21/13   Doug SouSam Jacubowitz, MD  ondansetron (ZOFRAN ODT) 8 MG disintegrating tablet Take 1 tablet (8 mg total) by mouth every 8 (eight) hours as needed for nausea or  vomiting. 10/10/13   Hilario Quarryanielle S Makela Niehoff, MD  oxybutynin (DITROPAN) 5 MG tablet Take 5 mg by mouth 3 (three) times daily.    Historical Provider, MD  oxyCODONE (ROXICODONE) 5 MG immediate release tablet Take 1 tablet (5 mg total) by mouth every 4 (four) hours as needed for severe pain. 08/21/13   Doug SouSam Jacubowitz, MD  oxyCODONE-acetaminophen (PERCOCET) 5-325 MG per tablet Take 1 tablet by mouth every 4 (four) hours as needed for moderate pain. 09/06/13   John L Molpus, MD   BP 134/74  Pulse 95  Temp(Src) 98.5 F (36.9 C) (Oral)  Resp 18  Ht 5\' 7"  (1.702 m)  Wt 289 lb (131.09 kg)  BMI 45.25 kg/m2  SpO2 98%  LMP 10/03/2013 Physical Exam  Nursing note and vitals reviewed. Constitutional: She is oriented to person, place, and time. She appears well-developed and well-nourished.  HENT:  Head: Normocephalic and atraumatic.  Right Ear: External ear normal.  Left Ear: External ear normal.  Nose: Nose normal.  Mouth/Throat: Oropharynx is clear and moist.  Eyes: Conjunctivae and EOM are normal. Pupils are equal, round, and reactive to light.  Neck: Normal range of motion. Neck supple.  Cardiovascular: Normal rate, regular rhythm, normal heart sounds and intact distal pulses.   Pulmonary/Chest: Effort normal and breath sounds normal.  Abdominal: Soft. Bowel sounds are normal. There is  tenderness.  Moderate diffuse ttp  Musculoskeletal: Normal range of motion.  Neurological: She is alert and oriented to person, place, and time. She has normal reflexes.  Skin: Skin is warm and dry.  Psychiatric: She has a normal mood and affect. Her behavior is normal. Judgment and thought content normal.    ED Course  Procedures (including critical care time) Labs Review Labs Reviewed  CBC WITH DIFFERENTIAL - Abnormal; Notable for the following:    WBC 11.5 (*)    Monocytes Relative 2 (*)    Neutro Abs 8.1 (*)    All other components within normal limits  COMPREHENSIVE METABOLIC PANEL - Abnormal; Notable for  the following:    Sodium 133 (*)    Chloride 95 (*)    Glucose, Bld 444 (*)    Albumin 3.1 (*)    AST 107 (*)    ALT 155 (*)    Alkaline Phosphatase 243 (*)    All other components within normal limits  URINALYSIS, ROUTINE W REFLEX MICROSCOPIC - Abnormal; Notable for the following:    Specific Gravity, Urine 1.045 (*)    Glucose, UA >1000 (*)    Hgb urine dipstick MODERATE (*)    Leukocytes, UA SMALL (*)    All other components within normal limits  URINE MICROSCOPIC-ADD ON - Abnormal; Notable for the following:    Bacteria, UA MANY (*)    All other components within normal limits  URINE CULTURE  LIPASE, BLOOD  PREGNANCY, URINE  OCCULT BLOOD X 1 CARD TO LAB, STOOL  CBG MONITORING, ED    Imaging Review Ct Abdomen Pelvis W Contrast  10/10/2013   CLINICAL DATA:  Epigastric and right lower quadrant pain since this morning. Nausea.  EXAM: CT ABDOMEN AND PELVIS WITH CONTRAST  TECHNIQUE: Multidetector CT imaging of the abdomen and pelvis was performed using the standard protocol following bolus administration of intravenous contrast.  CONTRAST:  25mL OMNIPAQUE IOHEXOL 300 MG/ML SOLN, OMNIPAQUE IOHEXOL 300 MG/ML SOLN  COMPARISON:  08/16/2013.  FINDINGS: Lower chest: Lung bases show no acute findings. Heart size is at the upper limits of normal. No pericardial or pleural effusion.  Hepatobiliary: Liver is unremarkable. Gallbladder is filled with stones. A calcification in the porta hepatis is unchanged. No biliary ductal dilatation.  Pancreas: Negative.  Spleen: Negative.  Adrenals/Urinary Tract: Adrenal glands and kidneys are unremarkable. Ureters are decompressed. Bladder is underdistended, limiting evaluation.  Stomach/Bowel: Stomach, small bowel, appendix and colon are unremarkable.  Vascular/Lymphatic: Vascular structures are grossly unremarkable. Portacaval lymph node measures 1.6 cm, stable.  Reproductive: Uterus and ovaries are visualized.  Other: Small periumbilical hernia contains  fat. No free fluid. Mesenteries and peritoneum are unremarkable.  Musculoskeletal: No worrisome lytic or sclerotic lesions. Degenerative changes are seen in the spine.  IMPRESSION: 1. No acute findings to explain the patient's pain. 2. Cholelithiasis.   Electronically Signed   By: Leanna Battles M.D.   On: 10/10/2013 12:37     EKG Interpretation None      MDM   Final diagnoses:  UTI (lower urinary tract infection)  Hyperglycemia   Patient improved here with treatment.  Hyperglycemia decreased to 200s after fluid bolus/  No evidence of dka.  Patient states bs often in 200s.  Discussed need for tighter control and close follow up.  Patient voices understanding.    Hilario Quarry, MD 10/10/13 [phone]

## 2013-10-10 NOTE — ED Notes (Signed)
Generalized abdominal pain and back pain since this am.  No po intake.  Some nausea, no vomiting.

## 2013-10-10 NOTE — ED Notes (Signed)
MD at bedside. States ok fot pt to eat. Cheese and crackers given

## 2013-10-10 NOTE — Discharge Instructions (Signed)
Abdominal Pain Many things can cause belly (abdominal) pain. Most times, the belly pain is not dangerous. Many cases of belly pain can be watched and treated at home. HOME CARE   Do not take medicines that help you go poop (laxatives) unless told to by your doctor.  Only take medicine as told by your doctor.  Eat or drink as told by your doctor. Your doctor will tell you if you should be on a special diet. GET HELP IF:  You do not know what is causing your belly pain.  You have belly pain while you are sick to your stomach (nauseous) or have runny poop (diarrhea).  You have pain while you pee or poop.  Your belly pain wakes you up at night.  You have belly pain that gets worse or better when you eat.  You have belly pain that gets worse when you eat fatty foods.  You have a fever. GET HELP RIGHT AWAY IF:   The pain does not go away within 2 hours.  You keep throwing up (vomiting).  The pain changes and is only in the right or left part of the belly.  You have bloody or tarry looking poop. MAKE SURE YOU:   Understand these instructions.  Will watch your condition.  Will get help right away if you are not doing well or get worse. Document Released: 06/11/2007 Document Revised: 12/28/2012 Document Reviewed: 09/01/2012 [facility] Patient Information 2015 [place], [place]. This information is not intended to replace advice given to you by your health care provider. Make sure you discuss any questions you have with your health care provider. Urinary Tract Infection A urinary tract infection (UTI) can occur any place along the urinary tract. The tract includes the kidneys, ureters, bladder, and urethra. A type of germ called bacteria often causes a UTI. UTIs are often helped with antibiotic medicine.  HOME CARE   If given, take antibiotics as told by your doctor. Finish them even if you start to feel better.  Drink enough fluids to keep your pee (urine) clear or pale  yellow.  Avoid tea, drinks with caffeine, and bubbly (carbonated) drinks.  Pee often. Avoid holding your pee in for a long time.  Pee before and after having sex (intercourse).  Wipe from front to back after you poop (bowel movement) if you are a woman. Use each tissue only once. GET HELP RIGHT AWAY IF:   You have back pain.  You have lower belly (abdominal) pain.  You have chills.  You feel sick to your stomach (nauseous).  You throw up (vomit).  Your burning or discomfort with peeing does not go away.  You have a fever.  Your symptoms are not better in 3 days. MAKE SURE YOU:   Understand these instructions.  Will watch your condition.  Will get help right away if you are not doing well or get worse. Document Released: 06/11/2007 Document Revised: 09/17/2011 Document Reviewed: 07/24/2011 [facility] Patient Information 2015 [place]Care, [place]LL[place]. This information is not intended to replace advice given to you by your health care provider. Make sure you discuss any questions you have with your health care provider.    Emergency Department Resource Guide 1) Find a Doctor and Pay Out of Pocket Although you won't have to find out who is covered by your insurance plan, it is a good idea to ask around and get recommendations. You will then need to call the office and see if the doctor you have chosen will accept  you as a new patient and what types of options they offer for patients who are self-pay. Some doctors offer discounts or will set up payment plans for their patients who do not have insurance, but you will need to ask so you aren't surprised when you get to your appointment.  2) Contact Your Local Health Department Not all health departments have doctors that can see patients for sick visits, but many do, so it is worth a call to see if yours does. If you don't know where your local health department is, you can check in your phone book. The CDC also has a tool to help you locate  your state's health department, and many state websites also have listings of all of their local health departments.  3) Find a Walk-in Clinic If your illness is not likely to be very severe or complicated, you may want to try a walk in clinic. These are popping up all over the country in pharmacies, drugstores, and shopping centers. They're usually staffed by nurse practitioners or physician assistants that have been trained to treat common illnesses and complaints. They're usually fairly quick and inexpensive. However, if you have serious medical issues or chronic medical problems, these are probably not your best option.  No Primary Care Doctor: - Call Health Connect at  [phone] - they can help you locate a primary care doctor that  accepts your insurance, provides certain services, etc. - Physician Referral Service- [phone]  Chronic Pain Problems: Organization         Address  Phone   Notes  [facility]  [phone] Patients need to be referred by their primary care doctor.   Medication Assistance: Organization         Address  Phone   Notes  [facility] Medication [facility] [address] [place] [place], [place] [address] [phone] --Must be a resident of [facility] -- Must have NO insurance coverage whatsoever (no Medicaid/ Medicare, etc.) -- The pt. MUST have a primary care doctor that directs their care regularly and follows them in the community   [facility]ist  [phone]   [employer]  [phone]    Agencies that provide inexpensive medical care: Organization         Address  Phone   Notes  [facility]  [phone]   [facility]    [phone]   [facility] [address] [place], [place] [address] [phone]   [facility] [address] [place], [place] [phone]   Planned Parenthood    [phone]   [facility]     [phone]   Community Health and [facility]  [address], [place] Phone:  [phone], Fax:  [phone] Hours of Operation:  9 am - 6 pm, M-F.  Also accepts Medicaid/Medicare and self-pay.  [facility] for Children  [address], [place] Phone: [phone], Fax: [phone]. Hours of Operation:  8:30 am - 5:30 pm, M-F.  Also accepts Medicaid and self-pay.  [facility] [place] [address], [place] Phone: [phone]   [facility] [address] Natasha Bence [place], [place] [phone], Ext. [phone] Mondays & Thursdays: 7-9 AM.  First 15 patients are seen on a first come, first serve basis.    Medicaid-accepting [facility] Providers:  Organization  Address  Phone   Notes  [facility] [address], [place] [phone] Also accepts self-pay patients.  [facility] [address], [place]  [phone]   [place], [address], [place] [phone]   [facility] [address], [place] [phone]   Lucianne Lei [address], [place]   [phone] Only accepts [place] Access [place] patients after they have their name applied to their card.   Self-Pay (no insurance) in [facility]:  Organization         Address  Phone   Notes  Sickle Cell Patients, [facility] Internal Medicine [place] [phone]   [facility] [place] [phone]   [facility] [place]  [place], [place], [place] [phone]   [facility]/Dr. Osei-Bonsu  [address] [place] or [address], [place] [phone] Phone number for both [place] and [place] locations is the same.  [facility] [address], [place] [phone]   [facility] [address], [place] or [address] [phone]   [facility] [address] [place] [phone], phone; [phone], fax Sees patients 1st and 3rd Saturday of every month.  Must not qualify for public or private insurance (i.e. Medicaid, Medicare, [place] Health Choice, Veterans' Benefits)  Household income should be no more than 200% of the poverty level The clinic cannot treat you if you are pregnant or think you are pregnant  Sexually transmitted diseases are not treated at the clinic.    Dental Care: Organization         Address  Phone  Notes  [facility] Department of [facility] [place] [phone] Accepts children up to age 46 who are enrolled in [place] or [place]; pregnant women with a Medicaid card; and children who have applied for Medicaid or [place] Health Choice, but were declined, whose parents can pay a reduced fee at time of service.  [facility] Department of [facility]  [address], [place] [phone] Accepts children up to age 30 who are enrolled in [place] or [place]; pregnant women with a Medicaid card; and children who have applied for Medicaid or [place] Health Choice, but were declined, whose parents can pay a reduced fee at time of service.  [place] [facility] Access PRO[facility]  [place] [phone] Patients are seen by appointment only. Walk-ins are not accepted. [place] will see patients 83 years of age and older. Monday - Tuesday (8am-5pm) Most Wednesdays (8:30-5pm) $30 per visit, cash only  [facility] Adult Dental Access PROGRAM  [address], [facility] [phone] Patients are seen by appointment only. Walk-ins are not accepted. [place] will see patients 16 years of age and older. One Wednesday Evening (Monthly: Volunteer  Based).  $30 per visit, cash only  [place]  [phone] for adults; Children under age 8, call Graduate Pediatric Dentistry at [phone]. Children aged 47-14, please call [phone] to request a pediatric application.  Dental services are provided in all areas of dental care including  fillings, crowns and bridges, complete and partial dentures, implants, gum treatment, root canals, and extractions. Preventive care is also provided. Treatment is provided to both adults and children. Patients are selected via a lottery and there is often a waiting list.   [facility] [address], [place]  [phone] [email or web address]   [facility] [address] [place], [place] [phone], Ext. [phone] Second and Fourth Thursday of each month, opens at 6:30 AM; Clinic ends at 9 AM.  Patients are seen on a first-come first-served basis, and a limited number are seen during each clinic.   [facility]  [address] Hillard Danker [place], [place] [phone]   Eligibility Requirements You must have lived in [place], [place], or [place] counties for at least the last three months.   You cannot be eligible for state or federal sponsored [employer], including [employer], [place], or [employer].   You generally cannot be eligible for healthcare insurance through your employer.    How to apply: Eligibility screenings are held every Tuesday and Wednesday afternoon from 1:00 pm until 4:00 pm. You do not need an appointment for the interview!  [facility] [address] [place], [place]   [place]  [place] Department  [place]  [phone]    Behavioral Health Resources in the Community: Intensive Outpatient Programs Organization         Address  Phone  Notes  [place]. [address] [place], [place] [phone]   [facility] Outpatient [address] [place], [place]   ADS: Alcohol & Drug Svcs [address] [place], [place]   [place] [address],  [place], [place] or [phone]   Substance Abuse Resources Organization         Address  Phone  Notes  Alcohol and Drug Services  [phone]   [place]  [phone]   [facility] [place]   Chinita Pester  [phone]   Residential & Outpatient Substance Abuse Program  [phone]   Psychological Services Organization         Address  Phone  Notes  [facility] [place]  [place]  [phone]   [place] [address] [place] or [phone]    Mobile Crisis Teams Organization         Address  Phone  Notes  [facility], Mobile Crisis Care Unit  [phone]   [facility]  [address] [place], [place]   [place] [address] [place] [phone]    Self-Help/Support Groups Organization         Address  Phone             Notes  [place]. of [place] - variety of support groups  [place] Call for more information  Narcotics Anonymous (NA), [facility] [address], [employer] [place]  2 meetings at this location   Special educational needs teacher         Address  Phone  Notes  [facility]idential Treatment [place],    [place]  [phone]   [facility]  [address] [place] [id number], [place], [place]   [place], [place]  [place] [phone] Admissions: 8am-3pm M-F  Incentives Substance [place] [address],    [place], [place] [phone]   [facility] [address] [place], [place], [place]    [facility] [address]  [place], [place]   [facility] Programs - Intensive Outpatient [address], [place], [place]   [facility] ([place].,  [place], [place] [phone] or [phone]   [facility] (RTS) [address], [place], [place] Accepts Medicaid  [facility] [address],  [place] [phone] Substance Abuse/Addiction Treatment   [facility] Organization         Address  Phone  Notes  [facility]  [phone]   Domenic Schwab, PhD [address] Arlis Porta [place], [place]   [phone] or [phone]   [place], [place] [phone]   [facility] [address], [place], [place] [phone] Insurance/Medicaid/sponsorship through [facility] and Families [address], [address] [place]                                    [place], [place] [phone] [place] [address], [place] [phone]    Dr. Adele Schilder  [phone]   [facility] Dept. 1) [address], [place] 2) [place] 3)  [place] [address], [place] [phone]  [phone]   [place] [phone] or [phone] (After Hours)

## 2013-10-11 LAB — CBG MONITORING, ED: GLUCOSE-CAPILLARY: 281 mg/dL — AB (ref 70–99)

## 2013-10-12 LAB — URINE CULTURE
Colony Count: >=100000
SPECIAL REQUESTS: NORMAL

## 2013-10-13 ENCOUNTER — Telehealth (HOSPITAL_BASED_OUTPATIENT_CLINIC_OR_DEPARTMENT_OTHER): Payer: Self-pay | Admitting: Emergency Medicine

## 2013-10-13 NOTE — Telephone Encounter (Signed)
Post ED Visit - Positive Culture Follow-up  Culture report reviewed by antimicrobial stewardship pharmacist: []  Wes Dulaney, Pharm.D., BCPS []  Celedonio MiyamotoJeremy Frens, Pharm.D., BCPS []  Georgina PillionElizabeth Martin, [address] BoulevardPharm.D., BCPS [x]  MaunawiliMinh Pham, [address] BoulevardPharm.D., BCPS, AAHIVP []  Estella HuskMichelle Turner, Pharm.D., BCPS, AAHIVP []  Carly Sabat, Pharm.D. []  Enzo BiNathan Batchelder, [address] BoulevardPharm.D.  Positive urine culture E. Coli Treated with cephalexin 500mg  po qid x 5 days, organism sensitive to the same and no further patient follow-up is required at this time.  Berle MullMiller, Cinsere Mizrahi 10/13/2013, 11:18 AM

## 2013-11-22 ENCOUNTER — Encounter (HOSPITAL_COMMUNITY): Payer: Self-pay | Admitting: Emergency Medicine

## 2013-11-22 ENCOUNTER — Emergency Department (HOSPITAL_COMMUNITY)
Admission: EM | Admit: 2013-11-22 | Discharge: 2013-11-23 | Disposition: A | Discharge status: Home / Self Care | Payer: Medicaid Other | Attending: Emergency Medicine | Admitting: Emergency Medicine

## 2013-11-22 DIAGNOSIS — R109 Unspecified abdominal pain: Secondary | ICD-10-CM | POA: Diagnosis present

## 2013-11-22 DIAGNOSIS — R740 Nonspecific elevation of levels of transaminase and lactic acid dehydrogenase [LDH]: Secondary | ICD-10-CM | POA: Diagnosis not present

## 2013-11-22 DIAGNOSIS — B3731 Acute candidiasis of vulva and vagina: Secondary | ICD-10-CM

## 2013-11-22 DIAGNOSIS — I10 Essential (primary) hypertension: Secondary | ICD-10-CM | POA: Diagnosis not present

## 2013-11-22 DIAGNOSIS — R51 Headache: Secondary | ICD-10-CM | POA: Insufficient documentation

## 2013-11-22 DIAGNOSIS — L02411 Cutaneous abscess of right axilla: Secondary | ICD-10-CM | POA: Diagnosis not present

## 2013-11-22 DIAGNOSIS — L732 Hidradenitis suppurativa: Secondary | ICD-10-CM

## 2013-11-22 DIAGNOSIS — B373 Candidiasis of vulva and vagina: Secondary | ICD-10-CM | POA: Insufficient documentation

## 2013-11-22 DIAGNOSIS — E11649 Type 2 diabetes mellitus with hypoglycemia without coma: Secondary | ICD-10-CM | POA: Insufficient documentation

## 2013-11-22 DIAGNOSIS — Z794 Long term (current) use of insulin: Secondary | ICD-10-CM | POA: Insufficient documentation

## 2013-11-22 DIAGNOSIS — E1165 Type 2 diabetes mellitus with hyperglycemia: Secondary | ICD-10-CM

## 2013-11-22 DIAGNOSIS — J449 Chronic obstructive pulmonary disease, unspecified: Secondary | ICD-10-CM | POA: Diagnosis not present

## 2013-11-22 DIAGNOSIS — N390 Urinary tract infection, site not specified: Secondary | ICD-10-CM

## 2013-11-22 DIAGNOSIS — Z79899 Other long term (current) drug therapy: Secondary | ICD-10-CM | POA: Insufficient documentation

## 2013-11-22 DIAGNOSIS — L02212 Cutaneous abscess of back [any part, except buttock]: Secondary | ICD-10-CM

## 2013-11-22 DIAGNOSIS — R7401 Elevation of levels of liver transaminase levels: Secondary | ICD-10-CM

## 2013-11-22 DIAGNOSIS — R1030 Lower abdominal pain, unspecified: Secondary | ICD-10-CM | POA: Diagnosis not present

## 2013-11-22 DIAGNOSIS — Z3202 Encounter for pregnancy test, result negative: Secondary | ICD-10-CM | POA: Diagnosis not present

## 2013-11-22 DIAGNOSIS — R519 Headache, unspecified: Secondary | ICD-10-CM

## 2013-11-22 DIAGNOSIS — Z792 Long term (current) use of antibiotics: Secondary | ICD-10-CM | POA: Insufficient documentation

## 2013-11-22 DIAGNOSIS — G8929 Other chronic pain: Secondary | ICD-10-CM

## 2013-11-22 LAB — COMPREHENSIVE METABOLIC PANEL
ALK PHOS: 204 U/L — AB (ref 39–117)
ALT: 113 U/L — ABNORMAL HIGH (ref 0–35)
ANION GAP: 11 (ref 5–15)
AST: 72 U/L — ABNORMAL HIGH (ref 0–37)
Albumin: 3 g/dL — ABNORMAL LOW (ref 3.5–5.2)
BUN: 10 mg/dL (ref 6–23)
CO2: 26 mEq/L (ref 19–32)
Calcium: 8.9 mg/dL (ref 8.4–10.5)
Chloride: 97 mEq/L (ref 96–112)
Creatinine, Ser: 1 mg/dL (ref 0.50–1.10)
GFR calc Af Amer: 79 mL/min — ABNORMAL LOW (ref >90)
GFR calc non Af Amer: 68 mL/min — ABNORMAL LOW (ref >90)
Glucose, Bld: 361 mg/dL — ABNORMAL HIGH (ref 70–99)
POTASSIUM: 3.8 meq/L (ref 3.7–5.3)
SODIUM: 134 meq/L — AB (ref 137–147)
TOTAL PROTEIN: 8.1 g/dL (ref 6.0–8.3)
Total Bilirubin: 0.4 mg/dL (ref 0.3–1.2)

## 2013-11-22 LAB — URINALYSIS, ROUTINE W REFLEX MICROSCOPIC
BILIRUBIN URINE: NEGATIVE
Glucose, UA: >1000 mg/dL — AB
Hgb urine dipstick: NEGATIVE
KETONES UR: NEGATIVE mg/dL
Nitrite: NEGATIVE
PROTEIN: NEGATIVE mg/dL
SPECIFIC GRAVITY, URINE: 1.01 (ref 1.005–1.030)
UROBILINOGEN UA: 1 mg/dL (ref 0.0–1.0)
pH: 6.5 (ref 5.0–8.0)

## 2013-11-22 LAB — CBC WITH DIFFERENTIAL/PLATELET
Basophils Absolute: 0 10*3/uL (ref 0.0–0.1)
Basophils Relative: 0 % (ref 0–1)
EOS ABS: 0.1 10*3/uL (ref 0.0–0.7)
Eosinophils Relative: 1 % (ref 0–5)
HCT: 39.9 % (ref 36.0–46.0)
Hemoglobin: 13 g/dL (ref 12.0–15.0)
Lymphocytes Relative: 32 % (ref 12–46)
Lymphs Abs: 3.4 10*3/uL (ref 0.7–4.0)
MCH: 29.1 pg (ref 26.0–34.0)
MCHC: 32.6 g/dL (ref 30.0–36.0)
MCV: 89.3 fL (ref 78.0–100.0)
MONOS PCT: 8 % (ref 3–12)
Monocytes Absolute: 0.8 10*3/uL (ref 0.1–1.0)
NEUTROS PCT: 59 % (ref 43–77)
Neutro Abs: 6.5 10*3/uL (ref 1.7–7.7)
PLATELETS: 265 10*3/uL (ref 150–400)
RBC: 4.47 MIL/uL (ref 3.87–5.11)
RDW: 13.6 % (ref 11.5–15.5)
WBC: 10.9 10*3/uL — ABNORMAL HIGH (ref 4.0–10.5)

## 2013-11-22 LAB — CBG MONITORING, ED: GLUCOSE-CAPILLARY: 304 mg/dL — AB (ref 70–99)

## 2013-11-22 LAB — URINE MICROSCOPIC-ADD ON

## 2013-11-22 LAB — I-STAT TROPONIN, ED: TROPONIN I, POC: 0 ng/mL (ref 0.00–0.08)

## 2013-11-22 LAB — POC URINE PREG, ED: PREG TEST UR: NEGATIVE

## 2013-11-22 LAB — LIPASE, BLOOD: Lipase: 24 U/L (ref 11–59)

## 2013-11-22 MED ORDER — SODIUM CHLORIDE 0.9 % IV BOLUS (SEPSIS)
1000.0000 mL | Freq: Once | INTRAVENOUS | Status: AC
  Administered 2013-11-22: 1000 mL via INTRAVENOUS

## 2013-11-22 MED ORDER — HYDROMORPHONE HCL 1 MG/ML IJ SOLN
1.0000 mg | Freq: Once | INTRAMUSCULAR | Status: AC
  Administered 2013-11-22: 1 mg via INTRAVENOUS
  Filled 2013-11-22: qty 1

## 2013-11-22 MED ORDER — DIPHENHYDRAMINE HCL 50 MG/ML IJ SOLN
12.5000 mg | Freq: Once | INTRAMUSCULAR | Status: AC
  Administered 2013-11-22: 12.5 mg via INTRAVENOUS
  Filled 2013-11-22: qty 1

## 2013-11-22 NOTE — ED Notes (Signed)
The patient has multiple complaints.  She advises me that her lower abdomen has been hurting her but denies any vaginal discharge of any urinary symptoms.  She also says she has a headache and has taken Ibuprofen 800 and it is not working.  She also has several abscess in different stages of healing.

## 2013-11-22 NOTE — ED Notes (Signed)
PA at BS.  

## 2013-11-22 NOTE — ED Provider Notes (Signed)
CSN: [id number]     Arrival date & time 11/22/13  1944 History   First MD Initiated Contact with Patient 11/22/13 2143     Chief Complaint  Patient presents with  . Abdominal Pain    The patient has multiple complaints.    . Abscess  . Headache     (Consider location/radiation/quality/duration/timing/severity/associated sxs/prior Treatment) HPI Comments: Sheila Gibson is a 43 y.o. female with a PMHx of DM2, COPD, morbid obesity, HTN, recurrent abscesses, and chronic migraines, who presents to the ED with multiple complaints, including 2-3 weeks of HA, lower abd pain, and abscesses in R axilla and upper back. Patient reports a gradual onset headache beginning 2-3 weeks ago consistent with her chronic headaches, stating that is frontal 10/10 constant nonradiating throbbing pain with no known aggravating factors and unrelieved with ibuprofen 837m. She also reports lower abdominal pain 2 weeks that she describes as constant 10/10 midline suprapubic pulling/pressure-like pain which is nonradiating, worse with walking, and unrelieved by ibuprofen 8053m The abscesses to her right axilla are draining a mucopurulent and somewhat bloody drainage and have been present for approximately 2 weeks, and the abscess on her back is indurated without drainage and also present x2wks. Both areas are warm to the touch and have increased in size slightly since onset. She reports that these are recurrent abscesses for which she has been referred to a surgeon before but has not seen them yet. She has not done anything for these areas to help with pain or drainage. Additionally she reports chronic rhinorrhea and sinus congestion related to her allergies for which she is taking Zyrtec. She denies fever, chills, tinnitus, vertigo, dizziness, lightheadedness, weakness, focal neuro deficits, vision changes, hearing loss, ear pain/drainage, photophobia, phonophobia, chest pain, shortness breath, nausea, vomiting, diarrhea,  constipation, melena, hematochezia, obstipation, dysuria, hematuria, vaginal bleeding, vaginal discharge, myalgias, arthralgias, paresthesias, or tingling. She denies any red streaking to any of the abscessed areas. Currently in between primary care doctors, has an appt in 2wks for a new PCP. Complaint on all meds, last took Lantus this AM since she missed last night's dose.   Patient is a 89.o. female presenting with abdominal pain, headaches, and abscess. The history is provided by the patient. No language interpreter was used.  Abdominal Pain Pain location:  Suprapubic Pain quality: pressure and tugging   Pain radiates to:  Does not radiate Pain severity:  Severe (10/10) Onset quality:  Gradual Duration:  2 weeks Timing:  Constant Progression:  Unchanged Chronicity:  Recurrent Context: not alcohol use, not diet changes, not recent sexual activity, not recent travel, not sick contacts and not suspicious food intake   Relieved by:  Nothing Worsened by:  Movement Ineffective treatments:  NSAIDs Associated symptoms: no anorexia, no belching, no chest pain, no chills, no constipation, no cough, no diarrhea, no dysuria, no fatigue, no fever, no flatus, no hematemesis, no hematochezia, no hematuria, no melena, no nausea, no shortness of breath, no sore throat, no vaginal bleeding, no vaginal discharge and no vomiting   Headache Pain location:  Frontal Quality: throbbing. Radiates to:  Does not radiate Severity currently:  10/10 Severity at highest:  10/10 Onset quality:  Gradual Duration:  3 weeks Timing:  Constant Progression:  Waxing and waning Chronicity:  Chronic Similar to prior headaches: yes   Relieved by:  Nothing Worsened by:  Nothing tried Ineffective treatments:  NSAIDs Associated symptoms: abdominal pain, congestion (sinuses, due to allergies) and sinus pressure (chronic allergies)   Associated symptoms:  no back pain, no blurred vision, no cough, no diarrhea, no dizziness,  no drainage, no ear pain, no pain, no facial pain, no fatigue, no fever, no focal weakness, no hearing loss, no loss of balance, no myalgias, no nausea, no near-syncope, no neck pain, no neck stiffness, no numbness, no paresthesias, no photophobia, no sore throat, no swollen glands, no syncope, no tingling, no URI, no visual change, no vomiting and no weakness   Risk factors: sedentary lifestyle   Abscess Location:  Shoulder/arm and torso Shoulder/arm abscess location:  R axilla Torso abscess location:  Upper back Abscess quality: draining (R axilla), induration (both areas), painful, warmth and weeping   Abscess quality: no fluctuance   Red streaking: no   Duration:  2 weeks Progression:  Unchanged Associated symptoms: headaches   Associated symptoms: no anorexia, no fatigue, no fever, no nausea and no vomiting     Past Medical History  Diagnosis Date  . Diabetes mellitus without complication   . COPD (chronic obstructive pulmonary disease)   . Obesity   . Hypertension    Past Surgical History  Procedure Laterality Date  . Thyroidectomy    . Cesarean section     History reviewed. No pertinent family history. History  Substance Use Topics  . Smoking status: Never Smoker   . Smokeless tobacco: Not on file  . Alcohol Use: No   OB History    No data available     Review of Systems  Constitutional: Negative for fever, chills and fatigue.  HENT: Positive for congestion (sinuses, due to allergies), rhinorrhea (clear) and sinus pressure (chronic allergies). Negative for ear discharge, ear pain, hearing loss, postnasal drip, sore throat and tinnitus.   Eyes: Negative for blurred vision, photophobia, pain, discharge and itching.  Respiratory: Negative for cough, chest tightness, shortness of breath and wheezing.   Cardiovascular: Negative for chest pain, syncope and near-syncope.  Gastrointestinal: Positive for abdominal pain. Negative for nausea, vomiting, diarrhea, constipation,  blood in stool, melena, hematochezia, abdominal distention, rectal pain, anorexia, flatus and hematemesis.  Genitourinary: Negative for dysuria, urgency, frequency, hematuria, flank pain, vaginal bleeding, vaginal discharge and difficulty urinating.  Musculoskeletal: Negative for myalgias, back pain, arthralgias, neck pain and neck stiffness.  Skin: Positive for wound (draining abscess R axilla, indurated abscess R upper back).  Allergic/Immunologic: Positive for environmental allergies.  Neurological: Positive for headaches. Negative for dizziness, focal weakness, syncope, weakness, light-headedness, numbness, paresthesias and loss of balance.   10 Systems reviewed and are negative for acute change except as noted in the HPI.    Allergies  Review of patient's allergies indicates no known allergies.  Home Medications   Prior to Admission medications   Medication Sig Start Date End Date Taking? Authorizing Provider  Canagliflozin (INVOKANA) 100 MG TABS Take by mouth.    Historical Provider, MD  cephALEXin (KEFLEX) 500 MG capsule Take 1 capsule (500 mg total) by mouth 4 (four) times daily. 10/10/13   Shaune Pollack, MD  insulin glargine (LANTUS) 100 unit/mL SOPN Inject 60 Units into the skin at bedtime.    Historical Provider, MD  metFORMIN (GLUCOPHAGE) 500 MG tablet Take 1 tablet (500 mg total) by mouth 2 (two) times daily with a meal. 08/21/13   Orlie Dakin, MD  ondansetron (ZOFRAN ODT) 8 MG disintegrating tablet Take 1 tablet (8 mg total) by mouth every 8 (eight) hours as needed for nausea or vomiting. 10/10/13   Shaune Pollack, MD  oxybutynin (DITROPAN) 5 MG tablet Take 5 mg  by mouth 3 (three) times daily.    Historical Provider, MD  oxyCODONE (ROXICODONE) 5 MG immediate release tablet Take 1 tablet (5 mg total) by mouth every 4 (four) hours as needed for severe pain. 08/21/13   Orlie Dakin, MD  oxyCODONE-acetaminophen (PERCOCET) 5-325 MG per tablet Take 1 tablet by mouth every 4 (four)  hours as needed for moderate pain. 09/06/13   John L Molpus, MD   BP 159/115 mmHg  Pulse 110  Temp(Src) 98.9 F (37.2 C)  Resp 16  Ht 5' 7"  (1.702 m)  Wt 285 lb (129.275 kg)  BMI 44.63 kg/m2  SpO2 96%  LMP 11/07/2013 Physical Exam  Constitutional: She is oriented to person, place, and time. She appears well-developed.  Non-toxic appearance. No distress.  Morbidly obese AAF, baseline tachycardia and hypertensive initially noted which resolved upon evaluation. Afebrile, nontoxic, NAD.  HENT:  Head: Normocephalic and atraumatic.  Right Ear: Hearing, tympanic membrane, external ear and ear canal normal.  Left Ear: Hearing, tympanic membrane, external ear and ear canal normal.  Nose: Nose normal.  Mouth/Throat: Uvula is midline and oropharynx is clear and moist. Mucous membranes are dry. No trismus in the jaw. No uvula swelling.  Mildly dry mucous membranes  Eyes: Conjunctivae and EOM are normal. Pupils are equal, round, and reactive to light. Right eye exhibits no discharge. Left eye exhibits no discharge.  PERRL, EOMI, conjunctiva clear  Neck: Normal range of motion. Neck supple.  FROM intact without spinous process or paraspinous muscle TTP, no bony stepoffs or deformities, no muscle spasms. No rigidity or meningeal signs. No bruising or swelling.  Cardiovascular: Normal rate, regular rhythm, normal heart sounds and intact distal pulses.  Exam reveals no gallop and no friction rub.   No murmur heard. Initially tachycardic which resolved. Reg rhythm, nl s1/s2, no m/r/g, distal pulses intact  Pulmonary/Chest: Effort normal and breath sounds normal. No respiratory distress. She has no decreased breath sounds. She has no wheezes. She has no rhonchi. She has no rales.  Abdominal: Soft. Bowel sounds are normal. She exhibits no distension. There is tenderness in the suprapubic area. There is no rigidity, no rebound, no guarding, no CVA tenderness, no tenderness at McBurney's point and negative  Murphy's sign.    Morbidly obese abdomen which limits exam Soft, nondistended, +BS throughout, with mild suprapubic TTP without r/g/r, no skin changes under pannus, neg murphy's, neg mcburney's, no CVA TTP  Musculoskeletal: Normal range of motion.  MAE x4 Strength 5/5 in all extremities Sensation grossly intact in all extremities  Neurological: She is alert and oriented to person, place, and time. She has normal strength. No cranial nerve deficit or sensory deficit. GCS eye subscore is 4. GCS verbal subscore is 5. GCS motor subscore is 6.  Strength and sensation grossly intact CN2-12 grossly intact A&O x4 GCS 15  Skin: Skin is warm and dry. Lesion (R axilla abscesses, R upper back abscess) noted. There is erythema.     R axilla with multiple draining abscesses, no fluctuance, mucopurulent drainage with TTP consistent with hydradenitis. R upper back with ~5cm indurated abscess with mild erythema but no warmth, small central scab without active drainage, no fluctuance  Psychiatric: She has a normal mood and affect.  Nursing note and vitals reviewed.   ED Course  Procedures (including critical care time) Labs Review Labs Reviewed  CBC WITH DIFFERENTIAL - Abnormal; Notable for the following:    WBC 10.9 (*)    All other components within normal limits  COMPREHENSIVE METABOLIC PANEL - Abnormal; Notable for the following:    Sodium 134 (*)    Glucose, Bld 361 (*)    Albumin 3.0 (*)    AST 72 (*)    ALT 113 (*)    Alkaline Phosphatase 204 (*)    GFR calc non Af Amer 68 (*)    GFR calc Af Amer 79 (*)    All other components within normal limits  URINALYSIS, ROUTINE W REFLEX MICROSCOPIC - Abnormal; Notable for the following:    Glucose, UA >1000 (*)    Leukocytes, UA TRACE (*)    All other components within normal limits  URINE MICROSCOPIC-ADD ON - Abnormal; Notable for the following:    Squamous Epithelial / LPF FEW (*)    All other components within normal limits  CBG  MONITORING, ED - Abnormal; Notable for the following:    Glucose-Capillary 304 (*)    All other components within normal limits  LIPASE, BLOOD  I-STAT TROPOININ, ED  POC URINE PREG, ED    Imaging Review No results found. CT ABDOMEN AND PELVIS WITH CONTRAST 10/10/13 COMPARISON: 08/16/2013.  FINDINGS: Lower chest: Lung bases show no acute findings. Heart size is at the upper limits of normal. No pericardial or pleural effusion.  Hepatobiliary: Liver is unremarkable. Gallbladder is filled with stones. A calcification in the porta hepatis is unchanged. No biliary ductal dilatation.  Pancreas: Negative.  Spleen: Negative.  Adrenals/Urinary Tract: Adrenal glands and kidneys are unremarkable. Ureters are decompressed. Bladder is underdistended, limiting evaluation.  Stomach/Bowel: Stomach, small bowel, appendix and colon are unremarkable.  Vascular/Lymphatic: Vascular structures are grossly unremarkable. Portacaval lymph node measures 1.6 cm, stable.  Reproductive: Uterus and ovaries are visualized.  Other: Small periumbilical hernia contains fat. No free fluid. Mesenteries and peritoneum are unremarkable.  Musculoskeletal: No worrisome lytic or sclerotic lesions. Degenerative changes are seen in the spine.  IMPRESSION: 1. No acute findings to explain the patient's pain. 2. Cholelithiasis.  Electronically Signed  By: Lorin Picket M.D.  On: 10/10/2013 12:37   EKG Interpretation None      MDM   Final diagnoses:  Lower abdominal pain  UTI (lower urinary tract infection)  Yeast infection of the vagina  Chronic nonintractable headache, unspecified headache type  Hydradenitis  Abscess of axilla, right  Abscess of back  Hyperglycemia due to type 2 diabetes mellitus  Elevated transaminase level    43y/o female with multiple complaints, including HA consistent with her chronic headaches, lower abd pain, and multiple abscesses. Benign abd exam,  mild suprapubic abd pain, no skin changes noted. Will await U/A as I believe this would be the etiology of her lower abd pain. CT last month showed cholelithiasis, neg murphy's today, doubt cholecystitis and doubt need for another emergent image at this time. Upreg neg, trop neg (and no complaint of CP), CBC w/diff showing baseline leukocytosis of 10.9 which is chronic. Lipase WNL. CMP showing gluc 361 which is baseline for pt. Also showing baseline elevations in AST/ALT/Alk phos consistent with prior results. Will give modified migraine cocktail with fluids, dilaudid, and benadryl to help with symptoms. Hope to correct her hyperglycemia with fluids, does not appear to be in DKA but will await urine to see if ketones are present. Also has abscesses under R axilla, draining, appears to be hydradenitis. Pt has had this before and hasn't followed up with surgery yet. Also has indurated area on back, no fluctuance, and has area where it appears to have drained. No I&D necessary  at this time, will give doxy and surgery referral for these. discussed importance of glucose control to help with this. Afebrile and nontoxic, doubt sepsis or need for emergent intervention. Will reassess shortly.  11:36 PM Lower abd pain and HA improved. VS improved after bolus. U/A with trace leuks, few epithelial, 11-20 WBC, rare bacteria and +yeast. Will tx as UTI since she is symptomatic, and also tx yeast infection. Will send for culture. No ketones on urine, doubt DKA at this time. Repeat CBG showing minimal improvement in gluc, now 304, will give one more bolus and discharge home.   1:17 AM Repeat CBG improved to 259. Discussed importance of tighter control of sugars and how this will help with her chronic yeast infections/UTIs/abscesses. Rx given for diflucan, macrobid, doxycycline, naprosyn and norco. Discussed f/up with PCP and with surgeon to eval need for surgical intervention of her hydradenitis/abscesses. I explained the  diagnosis and have given explicit precautions to return to the ER including for any other new or worsening symptoms. The patient understands and accepts the medical plan as it's been dictated and I have answered their questions. Discharge instructions concerning home care and prescriptions have been given. The patient is STABLE and is discharged to home in good condition.  BP 138/54 mmHg  Pulse 98  Temp(Src) 98.9 F (37.2 C)  Resp 27  Ht 5' 7"  (1.702 m)  Wt 285 lb (129.275 kg)  BMI 44.63 kg/m2  SpO2 94%  LMP 11/07/2013  Meds ordered this encounter  Medications  . sodium chloride 0.9 % bolus 1,000 mL    Sig:   . HYDROmorphone (DILAUDID) injection 1 mg    Sig:   . diphenhydrAMINE (BENADRYL) injection 12.5 mg    Sig:   . sodium chloride 0.9 % bolus 1,000 mL    Sig:   . fluconazole (DIFLUCAN) 150 MG tablet    Sig: Take 1 tablet (150 mg total) by mouth once.    Dispense:  1 tablet    Refill:  0    Order Specific Question:  Supervising Provider    Answer:  Noemi Chapel D [[id number]]  . doxycycline (VIBRAMYCIN) 100 MG capsule    Sig: Take 1 capsule (100 mg total) by mouth 2 (two) times daily. One po bid x 7 days    Dispense:  14 capsule    Refill:  0    Order Specific Question:  Supervising Provider    Answer:  Noemi Chapel D [[id number]]  . nitrofurantoin, macrocrystal-monohydrate, (MACROBID) 100 MG capsule    Sig: Take 1 capsule (100 mg total) by mouth 2 (two) times daily. X 7 days    Dispense:  14 capsule    Refill:  0    Order Specific Question:  Supervising Provider    Answer:  Noemi Chapel D [[id number]]  . naproxen (NAPROSYN) 500 MG tablet    Sig: Take 1 tablet (500 mg total) by mouth 2 (two) times daily as needed for mild pain, moderate pain or headache (TAKE WITH MEALS.).    Dispense:  20 tablet    Refill:  0    Order Specific Question:  Supervising Provider    Answer:  Noemi Chapel D [[id number]]  . HYDROcodone-acetaminophen (NORCO) 5-325 MG per tablet    Sig: Take 1-2 tablets by  mouth every 6 (six) hours as needed for severe pain.    Dispense:  6 tablet    Refill:  0    Order Specific Question:  Supervising Provider    Answer:  Sabra Heck, BRIAN D [address] [place], PA-C 11/23/13 0138  Charlesetta Shanks, MD 11/26/13 [phone]

## 2013-11-23 LAB — CBG MONITORING, ED: Glucose-Capillary: 259 mg/dL — ABNORMAL HIGH (ref 70–99)

## 2013-11-23 MED ORDER — NITROFURANTOIN MONOHYD MACRO 100 MG PO CAPS: 100.0000 mg | ORAL_CAPSULE | Freq: Two times a day (BID) | ORAL | Status: DC

## 2013-11-23 MED ORDER — DOXYCYCLINE HYCLATE 100 MG PO CAPS: 100.0000 mg | ORAL_CAPSULE | Freq: Two times a day (BID) | ORAL | Status: DC

## 2013-11-23 MED ORDER — FLUCONAZOLE 150 MG PO TABS: 150.0000 mg | ORAL_TABLET | Freq: Once | ORAL | Status: DC

## 2013-11-23 MED ORDER — HYDROCODONE-ACETAMINOPHEN 5-325 MG PO TABS: 1.0000 | ORAL_TABLET | Freq: Four times a day (QID) | ORAL | Status: DC | PRN

## 2013-11-23 MED ORDER — NAPROXEN 500 MG PO TABS: 500.0000 mg | ORAL_TABLET | Freq: Two times a day (BID) | ORAL | Status: DC | PRN

## 2013-11-23 NOTE — ED Notes (Signed)
[id number] 

## 2013-11-23 NOTE — Discharge Instructions (Signed)
Keep wounds clean and dry. Apply warm compresses to affected area throughout the day. Take Doxycycline until it is finished and avoid direct sunlight while taking this. Take naprosyn and norco as directed, as needed for pain but do not drive or operate machinery with pain medication use. Followup with the surgeon listed above in 2 days for wound recheck and chronic management of your recurrent abscesses. Monitor area for signs of worsening infection to include, but not limited to: increasing pain, redness, drainage/pus, or swelling. It is very important to get your diabetes under better control to help with all of your current symptoms and conditions. Stay very well hydrated with plenty of water throughout the day. Take Macrobid until completed. Use your pain medications as directed for pain or headaches. Additionally, take diflucan as directed to treat your yeast infection. Follow up with primary care physician in 1 week for recheck of ongoing symptoms but return to ER for emergent changing or worsening of symptoms. Please seek immediate care if you develop the following: You develop back pain.  Your symptoms are no better, or worse in 3 days. There is severe back pain or lower abdominal pain.  You develop chills.  You have a fever.  There is nausea or vomiting.  There is continued burning or discomfort with urination.    Hidradenitis Suppurativa, Sweat Gland Abscess Hidradenitis suppurativa is a long lasting (chronic), uncommon disease of the sweat glands. With this, boil-like lumps and scarring develop in the groin, some times under the arms (axillae), and under the breasts. It may also uncommonly occur behind the ears, in the crease of the buttocks, and around the genitals.  CAUSES  The cause is from a blocking of the sweat glands. They then become infected. It may cause drainage and odor. It is not contagious. So it cannot be given to someone else. It most often shows up in puberty (about 7410 to 3812  years of age). But it may happen much later. It is similar to acne which is a disease of the sweat glands. This condition is slightly more common in African-Americans and women. SYMPTOMS   Hidradenitis usually starts as one or more red, tender, swellings in the groin or under the arms (axilla).  Over a period of hours to days the lesions get larger. They often open to the skin surface, draining clear to yellow-colored fluid.  The infected area heals with scarring. DIAGNOSIS  Your caregiver makes this diagnosis by looking at you. Sometimes cultures (growing germs on plates in the lab) may be taken. This is to see what germ (bacterium) is causing the infection.  TREATMENT   Topical germ killing medicine applied to the skin (antibiotics) are the treatment of choice. Antibiotics taken by mouth (systemic) are sometimes needed when the condition is getting worse or is severe.  Avoid tight-fitting clothing which traps moisture in.  Dirt does not cause hidradenitis and it is not caused by poor hygiene.  Involved areas should be cleaned daily using an antibacterial soap. Some patients find that the liquid form of Lever 2000, applied to the involved areas as a lotion after bathing, can help reduce the odor related to this condition.  Sometimes surgery is needed to drain infected areas or remove scarred tissue. Removal of large amounts of tissue is used only in severe cases.  Birth control pills may be helpful.  Oral retinoids (vitamin A derivatives) for 6 to 12 months which are effective for acne may also help this condition.  Weight  loss will improve but not cure hidradenitis. It is made worse by being overweight. But the condition is not caused by being overweight.  This condition is more common in people who have had acne.  It may become worse under stress. There is no medical cure for hidradenitis. It can be controlled, but not cured. The condition usually continues for years with periods  of getting worse and getting better (remission). Document Released: 08/07/2003 Document Revised: 03/17/2011 Document Reviewed: 03/25/2013 [facility] Patient Information 2015 [place], [place]. This information is not intended to replace advice given to you by your health care provider. Make sure you discuss any questions you have with your health care provider.  General Headache Without Cause A headache is pain or discomfort felt around the head or neck area. The specific cause of a headache may not be found. There are many causes and types of headaches. A few common ones are:  Tension headaches.  Migraine headaches.  Cluster headaches.  Chronic daily headaches. HOME CARE INSTRUCTIONS   Keep all follow-up appointments with your caregiver or any specialist referral.  Only take over-the-counter or prescription medicines for pain or discomfort as directed by your caregiver.  Lie down in a dark, quiet room when you have a headache.  Keep a headache journal to find out what may trigger your migraine headaches. For example, write down:  What you eat and drink.  How much sleep you get.  Any change to your diet or medicines.  Try massage or other relaxation techniques.  Put ice packs or heat on the head and neck. Use these 3 to 4 times per day for 15 to 20 minutes each time, or as needed.  Limit stress.  Sit up straight, and do not tense your muscles.  Quit smoking if you smoke.  Limit alcohol use.  Decrease the amount of caffeine you drink, or stop drinking caffeine.  Eat and sleep on a regular schedule.  Get 7 to 9 hours of sleep, or as recommended by your caregiver.  Keep lights dim if bright lights bother you and make your headaches worse. SEEK MEDICAL CARE IF:   You have problems with the medicines you were prescribed.  Your medicines are not working.  You have a change from the usual headache.  You have nausea or vomiting. SEEK IMMEDIATE MEDICAL CARE IF:   Your  headache becomes severe.  You have a fever.  You have a stiff neck.  You have loss of vision.  You have muscular weakness or loss of muscle control.  You start losing your balance or have trouble walking.  You feel faint or pass out.  You have severe symptoms that are different from your first symptoms. MAKE SURE YOU:   Understand these instructions.  Will watch your condition.  Will get help right away if you are not doing well or get worse. Document Released: 12/23/2004 Document Revised: 03/17/2011 Document Reviewed: 01/08/2011 [facility] Patient Information 2015 [place], [place]. This information is not intended to replace advice given to you by your health care provider. Make sure you discuss any questions you have with your health care provider.  High Blood Sugar High blood sugar (hyperglycemia) means that the level of sugar in your blood is higher than it should be. Signs of high blood sugar include:  Feeling thirsty.  Frequent peeing (urinating).  Feeling tired or sleepy.  Dry mouth.  Vision changes.  Feeling weak.  Feeling hungry but losing weight.  Numbness and tingling in your hands or  feet.  Headache. When you ignore these signs, your blood sugar may keep going up. These problems may get worse, and other problems may begin. HOME CARE  Check your blood sugars as told by your doctor. Write down the numbers with the date and time.  Take the right amount of insulin or diabetes pills at the right time. Write down the dose with date and time.  Refill your insulin or diabetes pills before running out.  Watch what you eat. Follow your meal plan.  Drink liquids without sugar, such as water. Check with your doctor if you have kidney or heart disease.  Follow your doctor's orders for exercise. Exercise at the same time of day.  Keep your doctor's appointments. GET HELP RIGHT AWAY IF:   You have trouble thinking or are confused.  You have fast breathing  with fruity smelling breath.  You pass out (faint).  You have 2 to 3 days of high blood sugars and you do not know why.  You have chest pain.  You are feeling sick to your stomach (nauseous) or throwing up (vomiting).  You have sudden vision changes. MAKE SURE YOU:   Understand these instructions.  Will watch your condition.  Will get help right away if you are not doing well or get worse. Document Released: 10/20/2008 Document Revised: 03/17/2011 Document Reviewed: 10/20/2008 [facility] Patient Information 2015 [place], [place]. This information is not intended to replace advice given to you by your health care provider. Make sure you discuss any questions you have with your health care provider.  How to Avoid Diabetes Problems You can do a lot to prevent or slow down diabetes problems. Following your diabetes plan and taking care of yourself can reduce your risk of serious or life-threatening complications. Below, you will find certain things you can do to prevent diabetes problems. MANAGE YOUR DIABETES Follow your health care provider's, nurse educator's, and dietitian's instructions for managing your diabetes. They will teach you the basics of diabetes care. They can help answer questions you may have. Learn about diabetes and make healthy choices regarding eating and physical activity. Monitor your blood glucose level regularly. Your health care provider will help you decide how often to check your blood glucose level depending on your treatment goals and how well you are meeting them.  DO NOT USE NICOTINE Nicotine and diabetes are a dangerous combination. Nicotine raises your risk for diabetes problems. If you quit using nicotine, you will lower your risk for heart attack, stroke, nerve disease, and kidney disease. Your cholesterol and your blood pressure levels may improve. Your blood circulation will also improve. Do not use any tobacco products, including cigarettes, chewing tobacco,  or electronic cigarettes. If you need help quitting, ask your health care provider. KEEP YOUR BLOOD PRESSURE UNDER CONTROL Keeping your blood pressure under control will help prevent damage to your eyes, kidneys, heart, and blood vessels. Blood pressure consists of two numbers. The top number should be below 120, and the bottom number should be below 80 (120/80). Keep your blood pressure as close to these numbers as you can. If you already have kidney disease, you may want even lower blood pressure to protect your kidneys. Talk to your health care provider to make sure that your blood pressure goal is right for your needs. Meal planning, medicines, and exercise can help you reach your blood pressure target. Have your blood pressure checked at every visit with your health care provider. KEEP YOUR CHOLESTEROL UNDER CONTROL Normal cholesterol  levels will help prevent heart disease and stroke. These are the biggest health problems for people with diabetes. Keeping cholesterol levels under control can also help with blood flow. Have your cholesterol level checked at least once a year. Your health care provider may prescribe a medicine known as a statin. Statins lower your cholesterol. If you are not taking a statin, ask your health care provider if you should be. Meal planning, exercise, and medicines can help you reach your cholesterol targets.  SCHEDULE AND KEEP YOUR ANNUAL PHYSICAL EXAMS AND EYE EXAMS Your health care provider will tell you how often he or she wants to see you depending on your plan of treatment. It is important that you keep these appointments so that possible problems can be identified early and complications can be avoided or treated.  Every visit with your health care provider should include your weight, blood pressure, and an evaluation of your blood glucose control.  Your hemoglobin A1c should be checked:  At least twice a year if you are at your goal.  Every 3 months if there are  changes in treatment.  If you are not meeting your goals.  Your blood lipids should be checked yearly. You should also be checked yearly to see if you have protein in your urine (microalbumin).  Schedule a dilated eye exam within 5 years of your diagnosis if you have type 1 diabetes, and then yearly. Schedule a dilated eye exam at diagnosis if you have type 2 diabetes, and then yearly. All exams thereafter can be extended to every 2 to 3 years if one or more exams have been normal. KEEP YOUR VACCINES CURRENT The flu vaccine is recommended yearly. The formula for the vaccine changes every year and needs to be updated for the best protection against current viruses. It is recommended that people with diabetes who are over 265 years old get the pneumonia vaccine. In some cases, two separate shots may be given. Ask your health care provider if your pneumonia vaccination is up-to-date. However, there are some instances where another vaccine is recommended. Check with your health care provider. TAKE CARE OF YOUR FEET  Diabetes may cause you to have a poor blood supply (circulation) to your legs and feet. Because of this, the skin may be thinner, break easier, and heal more slowly. You also may have nerve damage in your legs and feet, causing decreased feeling. You may not notice minor injuries to your feet that could lead to serious problems or infections. Taking care of your feet is very important. Visual foot exams are performed at every routine medical visit. The exams check for cuts, injuries, or other problems with the feet. A comprehensive foot exam should be done yearly. This includes visual inspection as well as assessing foot pulses and testing for loss of sensation. You should also do the following:  Inspect your feet daily for cuts, calluses, blisters, ingrown toenails, and signs of infection, such as redness, swelling, or pus.  Wash and dry your feet thoroughly, especially between the  toes.  Avoid soaking your feet regularly in hot water baths.  Moisturize dry skin with lotion, avoiding areas between your toes.  Cut toenails straight across and file the edges.  Avoid shoes that do not fit well or have areas that irritate your skin.  Avoid going barefooted or wearing only socks. Your feet need protection. TAKE CARE OF YOUR TEETH People with poorly controlled diabetes are more likely to have gum (periodontal) disease. These infections  make diabetes harder to control. Periodontal diseases, if left untreated, can lead to tooth loss. Brush your teeth twice a day, floss, and see your dentist for checkups and cleaning every 6 months, or 2 times a year. ASK YOUR HEALTH CARE PROVIDER ABOUT TAKING ASPIRIN Taking aspirin daily is recommended to help prevent cardiovascular disease in people with and without diabetes. Ask your health care provider if this would benefit you and what dose he or she would recommend. DRINK RESPONSIBLY Moderate amounts of alcohol (less than 1 drink per day for adult women and less than 2 drinks per day for adult men) have a minimal effect on blood glucose if ingested with food. It is important to eat food with alcohol to avoid hypoglycemia. People should avoid alcohol if they have a history of alcohol abuse or dependence, if they are pregnant, and if they have liver disease, pancreatitis, advanced neuropathy, or severe hypertriglyceridemia. LESSEN STRESS Living with diabetes can be stressful. When you are under stress, your blood glucose may be affected in two ways:  Stress hormones may cause your blood glucose to rise.  You may be distracted from taking good care of yourself. It is a good idea to be aware of your stress level and make changes that are necessary to help you better manage challenging situations. Support groups, planned relaxation, a hobby you enjoy, meditation, healthy relationships, and exercise all work to lower your stress level. If your  efforts do not seem to be helping, get help from your health care provider or a trained mental health professional. Document Released: 09/10/2010 Document Revised: 05/09/2013 Document Reviewed: 02/16/2013 [facility] Patient Information 2015 [place], [place]. This information is not intended to replace advice given to you by your health care provider. Make sure you discuss any questions you have with your health care provider.  Urinary Tract Infection A urinary tract infection (UTI) can occur any place along the urinary tract. The tract includes the kidneys, ureters, bladder, and urethra. A type of germ called bacteria often causes a UTI. UTIs are often helped with antibiotic medicine.  HOME CARE   If given, take antibiotics as told by your doctor. Finish them even if you start to feel better.  Drink enough fluids to keep your pee (urine) clear or pale yellow.  Avoid tea, drinks with caffeine, and bubbly (carbonated) drinks.  Pee often. Avoid holding your pee in for a long time.  Pee before and after having sex (intercourse).  Wipe from front to back after you poop (bowel movement) if you are a woman. Use each tissue only once. GET HELP RIGHT AWAY IF:   You have back pain.  You have lower belly (abdominal) pain.  You have chills.  You feel sick to your stomach (nauseous).  You throw up (vomit).  Your burning or discomfort with peeing does not go away.  You have a fever.  Your symptoms are not better in 3 days. MAKE SURE YOU:   Understand these instructions.  Will watch your condition.  Will get help right away if you are not doing well or get worse. Document Released: 06/11/2007 Document Revised: 09/17/2011 Document Reviewed: 07/24/2011 [facility] Patient Information 2015 [place], [place]. This information is not intended to replace advice given to you by your health care provider. Make sure you discuss any questions you have with your health care provider.  Candidal  Vulvovaginitis Candidal vulvovaginitis is an infection of the vagina and vulva. The vulva is the skin around the opening of the vagina. This  may cause itching and discomfort in and around the vagina.  HOME CARE  Only take medicine as told by your doctor.  Do not have sex (intercourse) until the infection is healed or as told by your doctor.  Practice safe sex.  Tell your sex partner about your infection.  Do not douche or use tampons.  Wear cotton underwear. Do not wear tight pants or panty hose.  Eat yogurt. This may help treat and prevent yeast infections. GET HELP RIGHT AWAY IF:   You have a fever.  Your problems get worse during treatment or do not get better in 3 days.  You have discomfort, irritation, or itching in your vagina or vulva area.  You have pain after sex.  You start to get belly (abdominal) pain. MAKE SURE YOU:  Understand these instructions.  Will watch your condition.  Will get help right away if you are not doing well or get worse. Document Released: 03/21/2008 Document Revised: 12/28/2012 Document Reviewed: 03/21/2008 [facility] Patient Information 2015 [place], [place]. This information is not intended to replace advice given to you by your health care provider. Make sure you discuss any questions you have with your health care provider.

## 2013-11-24 ENCOUNTER — Encounter (HOSPITAL_BASED_OUTPATIENT_CLINIC_OR_DEPARTMENT_OTHER): Payer: Self-pay | Admitting: Emergency Medicine

## 2013-11-24 ENCOUNTER — Emergency Department (HOSPITAL_BASED_OUTPATIENT_CLINIC_OR_DEPARTMENT_OTHER)
Admission: EM | Admit: 2013-11-24 | Discharge: 2013-11-25 | Disposition: A | Discharge status: Home / Self Care | Payer: Medicaid Other | Attending: Emergency Medicine | Admitting: Emergency Medicine

## 2013-11-24 DIAGNOSIS — I10 Essential (primary) hypertension: Secondary | ICD-10-CM | POA: Diagnosis not present

## 2013-11-24 DIAGNOSIS — J449 Chronic obstructive pulmonary disease, unspecified: Secondary | ICD-10-CM | POA: Diagnosis not present

## 2013-11-24 DIAGNOSIS — E669 Obesity, unspecified: Secondary | ICD-10-CM | POA: Diagnosis not present

## 2013-11-24 DIAGNOSIS — R110 Nausea: Secondary | ICD-10-CM | POA: Insufficient documentation

## 2013-11-24 DIAGNOSIS — E1165 Type 2 diabetes mellitus with hyperglycemia: Secondary | ICD-10-CM | POA: Diagnosis not present

## 2013-11-24 DIAGNOSIS — R739 Hyperglycemia, unspecified: Secondary | ICD-10-CM

## 2013-11-24 DIAGNOSIS — L02212 Cutaneous abscess of back [any part, except buttock]: Secondary | ICD-10-CM

## 2013-11-24 DIAGNOSIS — Z79899 Other long term (current) drug therapy: Secondary | ICD-10-CM | POA: Diagnosis not present

## 2013-11-24 DIAGNOSIS — Z7951 Long term (current) use of inhaled steroids: Secondary | ICD-10-CM | POA: Insufficient documentation

## 2013-11-24 DIAGNOSIS — Z794 Long term (current) use of insulin: Secondary | ICD-10-CM | POA: Insufficient documentation

## 2013-11-24 LAB — CBG MONITORING, ED: Glucose-Capillary: 426 mg/dL — ABNORMAL HIGH (ref 70–99)

## 2013-11-24 MED ORDER — LIDOCAINE-EPINEPHRINE 2 %-1:100000 IJ SOLN
1.7000 mL | Freq: Once | INTRAMUSCULAR | Status: AC
  Administered 2013-11-24: 1 mL
  Filled 2013-11-24: qty 1

## 2013-11-24 MED ORDER — INSULIN ASPART 100 UNIT/ML ~~LOC~~ SOLN
10.0000 [IU] | Freq: Once | SUBCUTANEOUS | Status: AC
  Administered 2013-11-25: 10 [IU] via SUBCUTANEOUS
  Filled 2013-11-24: qty 1

## 2013-11-24 MED ORDER — OXYCODONE-ACETAMINOPHEN 5-325 MG PO TABS
2.0000 | ORAL_TABLET | Freq: Once | ORAL | Status: AC
  Administered 2013-11-24: 2 via ORAL
  Filled 2013-11-24: qty 2

## 2013-11-24 NOTE — ED Provider Notes (Signed)
CSN: [id number]     Arrival date & time 11/24/13  2141 History   None    This chart was scribed for No att. providers found by Arlan OrganAshley Leger, ED Scribe. This patient was seen in room MH09/MH09 and the patient's care was started 11:04 PM.   Chief Complaint  Patient presents with  . Abscess   Patient is a 43 y.o. female presenting with abscess. The history is provided by the patient.  Abscess Abscess location: back. Duration:  2 days Progression:  Worsening Chronicity:  New Context: diabetes   Relieved by:  Nothing Exacerbated by: palpation. Associated symptoms: nausea   Associated symptoms: no fever     HPI Comments: Sheila Gibson is a 43 y.o. female with a PMHx of DM, COPD, and HTN who presents to the Emergency Department complaining of an abscess to the R upper back x 2 days that is not improving. Pt also reports nausea and chills. Sheila Gibson was seen 11/17 for same and was sent home on course of doxycyline. However, pt is also taking a second antibiotic for a urinary tract infection. At last visit, pt was given a short course of pain medication without any improvement for discomfort. She denies any CP, abdominal pain, SOB, or fever. Blood glucose have consistently ranged in 200's. No known allergies to medications.    Past Medical History  Diagnosis Date  . Diabetes mellitus without complication   . COPD (chronic obstructive pulmonary disease)   . Obesity   . Hypertension    Past Surgical History  Procedure Laterality Date  . Thyroidectomy    . Cesarean section     History reviewed. No pertinent family history. History  Substance Use Topics  . Smoking status: Never Smoker   . Smokeless tobacco: Not on file  . Alcohol Use: No   OB History    No data available     Review of Systems  Constitutional: Negative for fever.  Gastrointestinal: Positive for nausea.  Skin: Positive for wound.  All other systems reviewed and are negative.     Allergies  Review  of patient's allergies indicates no known allergies.  Home Medications   Prior to Admission medications   Medication Sig Start Date End Date Taking? Authorizing Provider  albuterol (PROVENTIL HFA;VENTOLIN HFA) 108 (90 BASE) MCG/ACT inhaler Inhale 1-2 puffs into the lungs every 6 (six) hours as needed for wheezing or shortness of breath.    Historical Provider, MD  beclomethasone (QVAR) 80 MCG/ACT inhaler Inhale 2 puffs into the lungs 2 (two) times daily.    Historical Provider, MD  Canagliflozin (INVOKANA) 100 MG TABS Take 100 mg by mouth daily.     Historical Provider, MD  doxycycline (VIBRAMYCIN) 100 MG capsule Take 1 capsule (100 mg total) by mouth 2 (two) times daily. One po bid x 7 days 11/23/13   [name], PA-C  fluconazole (DIFLUCAN) 150 MG tablet Take 1 tablet (150 mg total) by mouth once. 11/23/13   Mercedes Strupp Camprubi-Soms, PA-C  ibuprofen (ADVIL,MOTRIN) 800 MG tablet Take 800 mg by mouth 3 (three) times daily as needed. 10/17/13   Historical Provider, MD  insulin glargine (LANTUS) 100 unit/mL SOPN Inject 60 Units into the skin at bedtime.    Historical Provider, MD  metFORMIN (GLUCOPHAGE) 500 MG tablet Take 1 tablet (500 mg total) by mouth 2 (two) times daily with a meal. 08/21/13   Doug SouSam Jacubowitz, MD  naproxen (NAPROSYN) 500 MG tablet Take 1 tablet (500 mg total) by mouth  2 (two) times daily as needed for mild pain, moderate pain or headache (TAKE WITH MEALS.). 11/23/13   Mercedes Strupp Camprubi-Soms, PA-C  nitrofurantoin, macrocrystal-monohydrate, (MACROBID) 100 MG capsule Take 1 capsule (100 mg total) by mouth 2 (two) times daily. X 7 days 11/23/13   [name], PA-C  oxybutynin (DITROPAN) 5 MG tablet Take 5 mg by mouth 3 (three) times daily.    Historical Provider, MD  oxyCODONE-acetaminophen (PERCOCET/ROXICET) 5-325 MG per tablet Take 2 tablets by mouth every 4 (four) hours as needed for severe pain. 11/25/13   [name], MD   tiotropium (SPIRIVA) 18 MCG inhalation capsule Place 18 mcg into inhaler and inhale daily.    Historical Provider, MD   Triage Vitals: BP 137/81 mmHg  Pulse 118  Temp(Src) 99.9 F (37.7 C) (Oral)  Resp 18  Ht 5\' 7"  (1.702 m)  Wt 300 lb (136.079 kg)  BMI 46.98 kg/m2  SpO2 97%  LMP 11/07/2013   Physical Exam  CONSTITUTIONAL: Well developed/well nourished HEAD: Normocephalic/atraumatic EYES: EOMI/PERRL ENMT: Mucous membranes moist NECK: supple no meningeal signs SPINE/BACK:entire spine nontender CV: S1/S2 noted, no murmurs/rubs/gallops noted LUNGS: Lungs are clear to auscultation bilaterally, no apparent distress ABDOMEN: soft, nontender, no rebound or guarding, bowel sounds noted throughout abdomen. She is obese GU:no cva tenderness NEURO: Pt is awake/alert/appropriate, moves all extremitiesx4.  No facial droop.   EXTREMITIES: pulses normal/equal, full ROM SKIN: warm, color normal. Draining abscess with induration to R upper back. No crepitus. No streaking.  Mild fluctuance noted PSYCH: no abnormalities of mood noted, alert and oriented to situation   ED Course  Procedures   INCISION AND DRAINAGE Performed by: Joya GaskinsWICKLINE,Zubair Lofton W Consent: Verbal consent obtained. Risks and benefits: risks, benefits and alternatives were discussed Type: abscess  Body area: right upper back  Anesthesia: local infiltration  Incision was made with a scalpel.  Local anesthetic: lidocaine 2% with epinephrine  Anesthetic total: 8 ml  Complexity: complex Blunt dissection to break up loculations  Drainage: purulent Drainage amount: moderate Patient tolerance: Patient tolerated the procedure well with no immediate complications.     DIAGNOSTIC STUDIES: Oxygen Saturation is 97% on RA, Normal by my interpretation.    COORDINATION OF CARE: 11:03 PM-Discussed treatment plan with pt at bedside and pt agreed to plan.     Labs Review Labs Reviewed  CBG MONITORING, ED - Abnormal;  Notable for the following:    Glucose-Capillary 426 (*)    All other components within normal limits   Medications  oxyCODONE-acetaminophen (PERCOCET/ROXICET) 5-325 MG per tablet 2 tablet (2 tablets Oral Given 11/24/13 2331)  lidocaine-EPINEPHrine (XYLOCAINE W/EPI) 2 %-1:100000 (with pres) injection 1.7 mL (1 mL Infiltration Given by Other 11/24/13 2332)  insulin aspart (novoLOG) injection 10 Units (10 Units Subcutaneous Given 11/25/13 0002)     MDM  Pt with hyperglycemia and abscess She was given insulin and instructed to take her home lantus.  She is in no distress, no vomiting For her abscess, large amount of pus was extracted.  Her pain was improved.  It was NOT packed. Advised to keep area clean and change bandage daily.  Discussed need to control glucose and take her doxycycline.  We discussed strict return precautions BP 154/87 mmHg  Pulse 104  Temp(Src) 99.9 F (37.7 C) (Oral)  Resp 20  Ht 5\' 7"  (1.702 m)  Wt 300 lb (136.079 kg)  BMI 46.98 kg/m2  SpO2 99%  LMP 11/07/2013   Final diagnoses:  Abscess of upper back excluding scapular  region  Hyperglycemia    Nursing notes including past medical history and social history reviewed and considered in documentation Labs/vital reviewed myself and considered during evaluation Previous records reviewed and considered   I personally performed the services described in this documentation, which was scribed in my presence. The recorded information has been reviewed and is accurate.    Joya Gaskins, MD 11/25/13 [phone]

## 2013-11-24 NOTE — ED Notes (Signed)
Pt states that she has had a bump on her back for several weeks but Tuesday started hurting worse, [facility], they gave her antibiotics and pain medication. Pt states it's not getting any better

## 2013-11-24 NOTE — ED Notes (Signed)
Notified Dr. Bebe ShaggyWickline of pt current CBG. Pt reports she takes 60 units of Lantus prior to bedtime, has not taken tonight

## 2013-11-25 MED ORDER — OXYCODONE-ACETAMINOPHEN 5-325 MG PO TABS: 2.0000 | ORAL_TABLET | ORAL | Status: DC | PRN

## 2013-11-25 NOTE — Discharge Instructions (Signed)
Abscess °An abscess is an infected area that contains a collection of pus and debris. It can occur in almost any part of the body. An abscess is also known as a furuncle or boil. °CAUSES  °An abscess occurs when tissue gets infected. This can occur from blockage of oil or sweat glands, infection of hair follicles, or a minor injury to the skin. As the body tries to fight the infection, pus collects in the area and creates pressure under the skin. This pressure causes pain. People with weakened immune systems have difficulty fighting infections and get certain abscesses more often.  °SYMPTOMS °Usually an abscess develops on the skin and becomes a painful mass that is red, warm, and tender. If the abscess forms under the skin, you may feel a moveable soft area under the skin. Some abscesses break open (rupture) on their own, but most will continue to get worse without care. The infection can spread deeper into the body and eventually into the bloodstream, causing you to feel ill.  °DIAGNOSIS  °Your caregiver will take your medical history and perform a physical exam. A sample of fluid may also be taken from the abscess to determine what is causing your infection. °TREATMENT  °Your caregiver may prescribe antibiotic medicines to fight the infection. However, taking antibiotics alone usually does not cure an abscess. Your caregiver may need to make a small cut (incision) in the abscess to drain the pus. In some cases, gauze is packed into the abscess to reduce pain and to continue draining the area. °HOME CARE INSTRUCTIONS  °· Only take over-the-counter or prescription medicines for pain, discomfort, or fever as directed by your caregiver. °· If you were prescribed antibiotics, take them as directed. Finish them even if you start to feel better. °· If gauze is used, follow your caregiver's directions for changing the gauze. °· To avoid spreading the infection: °· Keep your draining abscess covered with a  bandage. °· Wash your hands well. °· Do not share personal care items, towels, or whirlpools with others. °· Avoid skin contact with others. °· Keep your skin and clothes clean around the abscess. °· Keep all follow-up appointments as directed by your caregiver. °SEEK MEDICAL CARE IF:  °· You have increased pain, swelling, redness, fluid drainage, or bleeding. °· You have muscle aches, chills, or a general ill feeling. °· You have a fever. °MAKE SURE YOU:  °· Understand these instructions. °· Will watch your condition. °· Will get help right away if you are not doing well or get worse. °Document Released: 10/02/2004 Document Revised: 06/24/2011 Document Reviewed: 03/07/2011 °ExitCare® Patient Information ©2015 [employer]. This information is not intended to replace advice given to you by your health care provider. Make sure you discuss any questions you have with your health care provider. ° °Abscess °Care After °An abscess (also called a boil or furuncle) is an infected area that contains a collection of pus. Signs and symptoms of an abscess include pain, tenderness, redness, or hardness, or you may feel a moveable soft area under your skin. An abscess can occur anywhere in the body. The infection may spread to surrounding tissues causing cellulitis. A cut (incision) by the surgeon was made over your abscess and the pus was drained out. Gauze may have been packed into the space to provide a drain that will allow the cavity to heal from the inside outwards. The boil may be painful for 5 to 7 days. Most people with a boil do not have   high fevers. Your abscess, if seen early, may not have localized, and may not have been lanced. If not, another appointment may be required for this if it does not get better on its own or with medications. °HOME CARE INSTRUCTIONS  °· Only take over-the-counter or prescription medicines for pain, discomfort, or fever as directed by your caregiver. °· When you bathe, soak and then  remove gauze or iodoform packs at least daily or as directed by your caregiver. You may then wash the wound gently with mild soapy water. Repack with gauze or do as your caregiver directs. °SEEK IMMEDIATE MEDICAL CARE IF:  °· You develop increased pain, swelling, redness, drainage, or bleeding in the wound site. °· You develop signs of generalized infection including muscle aches, chills, fever, or a general ill feeling. °· An oral temperature above 102° F (38.9° C) develops, not controlled by medication. °See your caregiver for a recheck if you develop any of the symptoms described above. If medications (antibiotics) were prescribed, take them as directed. °Document Released: 07/11/2004 Document Revised: 03/17/2011 Document Reviewed: 03/08/2007 °ExitCare® Patient Information ©2015 [employer]. This information is not intended to replace advice given to you by your health care provider. Make sure you discuss any questions you have with your health care provider. ° °

## 2014-02-10 ENCOUNTER — Encounter (HOSPITAL_COMMUNITY): Payer: Self-pay | Admitting: *Deleted

## 2014-02-10 ENCOUNTER — Emergency Department (HOSPITAL_COMMUNITY)
Admission: EM | Admit: 2014-02-10 | Discharge: 2014-02-11 | Disposition: A | Discharge status: Home / Self Care | Payer: Medicaid Other | Attending: Emergency Medicine | Admitting: Emergency Medicine

## 2014-02-10 DIAGNOSIS — Z794 Long term (current) use of insulin: Secondary | ICD-10-CM | POA: Diagnosis not present

## 2014-02-10 DIAGNOSIS — Z9889 Other specified postprocedural states: Secondary | ICD-10-CM | POA: Insufficient documentation

## 2014-02-10 DIAGNOSIS — Z79899 Other long term (current) drug therapy: Secondary | ICD-10-CM | POA: Diagnosis not present

## 2014-02-10 DIAGNOSIS — R1013 Epigastric pain: Secondary | ICD-10-CM | POA: Diagnosis present

## 2014-02-10 DIAGNOSIS — Z3202 Encounter for pregnancy test, result negative: Secondary | ICD-10-CM | POA: Diagnosis not present

## 2014-02-10 DIAGNOSIS — Z7951 Long term (current) use of inhaled steroids: Secondary | ICD-10-CM | POA: Insufficient documentation

## 2014-02-10 DIAGNOSIS — J449 Chronic obstructive pulmonary disease, unspecified: Secondary | ICD-10-CM | POA: Diagnosis not present

## 2014-02-10 DIAGNOSIS — K8020 Calculus of gallbladder without cholecystitis without obstruction: Secondary | ICD-10-CM | POA: Diagnosis not present

## 2014-02-10 DIAGNOSIS — R1010 Upper abdominal pain, unspecified: Secondary | ICD-10-CM

## 2014-02-10 DIAGNOSIS — I10 Essential (primary) hypertension: Secondary | ICD-10-CM | POA: Diagnosis not present

## 2014-02-10 DIAGNOSIS — E119 Type 2 diabetes mellitus without complications: Secondary | ICD-10-CM | POA: Diagnosis not present

## 2014-02-10 LAB — CBC WITH DIFFERENTIAL/PLATELET
BASOS ABS: 0 10*3/uL (ref 0.0–0.1)
Basophils Relative: 0 % (ref 0–1)
EOS PCT: 1 % (ref 0–5)
Eosinophils Absolute: 0.1 10*3/uL (ref 0.0–0.7)
HEMATOCRIT: 36.2 % (ref 36.0–46.0)
HEMOGLOBIN: 12.3 g/dL (ref 12.0–15.0)
Lymphocytes Relative: 34 % (ref 12–46)
Lymphs Abs: 3.6 10*3/uL (ref 0.7–4.0)
MCH: 29.5 pg (ref 26.0–34.0)
MCHC: 34 g/dL (ref 30.0–36.0)
MCV: 86.8 fL (ref 78.0–100.0)
MONOS PCT: 9 % (ref 3–12)
Monocytes Absolute: 1 10*3/uL (ref 0.1–1.0)
NEUTROS ABS: 5.9 10*3/uL (ref 1.7–7.7)
Neutrophils Relative %: 56 % (ref 43–77)
Platelets: 351 10*3/uL (ref 150–400)
RBC: 4.17 MIL/uL (ref 3.87–5.11)
RDW: 13 % (ref 11.5–15.5)
WBC: 10.6 10*3/uL — AB (ref 4.0–10.5)

## 2014-02-10 LAB — COMPREHENSIVE METABOLIC PANEL
ALBUMIN: 2.6 g/dL — AB (ref 3.5–5.2)
ALK PHOS: 114 U/L (ref 39–117)
ALT: 28 U/L (ref 0–35)
ANION GAP: 7 (ref 5–15)
AST: 33 U/L (ref 0–37)
BILIRUBIN TOTAL: 0.3 mg/dL (ref 0.3–1.2)
BUN: 9 mg/dL (ref 6–23)
CALCIUM: 8.4 mg/dL (ref 8.4–10.5)
CO2: 26 mmol/L (ref 19–32)
Chloride: 101 mmol/L (ref 96–112)
Creatinine, Ser: 0.78 mg/dL (ref 0.50–1.10)
GFR calc Af Amer: >90 mL/min (ref >90)
GFR calc non Af Amer: >90 mL/min (ref >90)
GLUCOSE: 308 mg/dL — AB (ref 70–99)
POTASSIUM: 3.8 mmol/L (ref 3.5–5.1)
Sodium: 134 mmol/L — ABNORMAL LOW (ref 135–145)
TOTAL PROTEIN: 8 g/dL (ref 6.0–8.3)

## 2014-02-10 LAB — PREGNANCY, URINE: Preg Test, Ur: NEGATIVE

## 2014-02-10 LAB — URINE MICROSCOPIC-ADD ON

## 2014-02-10 LAB — URINALYSIS, ROUTINE W REFLEX MICROSCOPIC
Hgb urine dipstick: NEGATIVE
KETONES UR: 15 mg/dL — AB
Leukocytes, UA: NEGATIVE
Nitrite: NEGATIVE
PROTEIN: 30 mg/dL — AB
SPECIFIC GRAVITY, URINE: 1.039 — AB (ref 1.005–1.030)
UROBILINOGEN UA: 4 mg/dL — AB (ref 0.0–1.0)
pH: 7 (ref 5.0–8.0)

## 2014-02-10 LAB — LIPASE, BLOOD: Lipase: 25 U/L (ref 11–59)

## 2014-02-10 MED ORDER — ONDANSETRON 4 MG PO TBDP
8.0000 mg | ORAL_TABLET | Freq: Once | ORAL | Status: AC
  Administered 2014-02-10: 8 mg via ORAL
  Filled 2014-02-10: qty 2

## 2014-02-10 MED ORDER — SODIUM CHLORIDE 0.9 % IV BOLUS (SEPSIS)
1000.0000 mL | Freq: Once | INTRAVENOUS | Status: AC
  Administered 2014-02-10: 1000 mL via INTRAVENOUS

## 2014-02-10 MED ORDER — OXYCODONE-ACETAMINOPHEN 5-325 MG PO TABS
1.0000 | ORAL_TABLET | Freq: Once | ORAL | Status: AC
  Administered 2014-02-10: 1 via ORAL
  Filled 2014-02-10: qty 1

## 2014-02-10 MED ORDER — MORPHINE SULFATE 4 MG/ML IJ SOLN
8.0000 mg | Freq: Once | INTRAMUSCULAR | Status: AC
  Administered 2014-02-10: 8 mg via INTRAVENOUS
  Filled 2014-02-10: qty 2

## 2014-02-10 NOTE — ED Notes (Addendum)
Pt seen at [facility] approx 2 months ago.  CT scan and US done.  No diagnosis at that time.  Pt referred to PCP for pelvic.  Has not followed up at this time.  Denies any discharge, odor.  Sts slight pain during intercourse with "lots of pressure".

## 2014-02-10 NOTE — ED Provider Notes (Signed)
CSN: [id number]     Arrival date & time 02/10/14  2021 History  This chart was scribed for Audree Camel, MD by Roxy Cedar, ED Scribe. This patient was seen in room B17C/B17C and the patient's care was started at 11:06 PM.   Chief Complaint  Patient presents with  . Abdominal Pain   Patient is a 44 y.o. female presenting with abdominal pain. The history is provided by the patient. No language interpreter was used.  Abdominal Pain Pain location:  Epigastric and LUQ Pain quality: sharp and stabbing   Pain radiates to:  Back Pain severity:  Moderate Onset quality:  Gradual Duration:  3 weeks Timing:  Constant Progression:  Waxing and waning Chronicity:  Recurrent Context: not eating   Relieved by:  Nothing Worsened by:  Nothing tried Associated symptoms: nausea   Associated symptoms: no chills, no constipation, no diarrhea, no dysuria, no fever, no vaginal bleeding and no vaginal discharge    HPI Comments: Sheila Gibson is a 44 y.o. female with a PMHx of diabetes, COPD, obesity and hypertension, who presents to the Emergency Department complaining of moderate sharp epigastric and LUQ with associated cramping in lower abdomen pain that began 3 weeks ago. Patient states that the pain has been persistent and gradually worsening. She states she has sporadic increased pain. She states that the pain initially feels like a menstrual cramp and worsens into a sharp pain. Her LNMP was last week. She states pain worsened with cycle. She reports associated lower back pain and mild nausea. She denies associated vaginal bleeding, vaginal pain, vaginal discharge, dysuria, diarrhea, constipation or fever. She reports similar pain a few weeks ago but states that the pain is more severe right now. Patient took tylenol with no relief. She reports that medication given upon arrival did not relieve pain. She states that intercourse partially relieves some pain. She denies that pain is associated to  eating. She denies use of antacids prior to arrival.   Past Medical History  Diagnosis Date  . Diabetes mellitus without complication   . COPD (chronic obstructive pulmonary disease)   . Obesity   . Hypertension    Past Surgical History  Procedure Laterality Date  . Thyroidectomy    . Cesarean section     No family history on file. History  Substance Use Topics  . Smoking status: Never Smoker   . Smokeless tobacco: Not on file  . Alcohol Use: No   OB History    No data available     Review of Systems  Constitutional: Negative for fever and chills.  Gastrointestinal: Positive for nausea and abdominal pain. Negative for diarrhea and constipation.  Genitourinary: Negative for dysuria, vaginal bleeding, vaginal discharge and vaginal pain.  Musculoskeletal: Positive for back pain.  All other systems reviewed and are negative.  Allergies  Review of patient's allergies indicates no known allergies.  Home Medications   Prior to Admission medications   Medication Sig Start Date End Date Taking? Authorizing Provider  albuterol (PROVENTIL HFA;VENTOLIN HFA) 108 (90 BASE) MCG/ACT inhaler Inhale 1-2 puffs into the lungs every 6 (six) hours as needed for wheezing or shortness of breath.   Yes Historical Provider, MD  albuterol (PROVENTIL) (2.5 MG/3ML) 0.083% nebulizer solution Take 2.5 mg by nebulization 2 (two) times daily.   Yes Historical Provider, MD  beclomethasone (QVAR) 80 MCG/ACT inhaler Inhale 2 puffs into the lungs 2 (two) times daily.   Yes Historical Provider, MD  Canagliflozin (INVOKANA) 100 MG TABS Take  100 mg by mouth daily.    Yes Historical Provider, MD  insulin glargine (LANTUS) 100 unit/mL SOPN Inject 30-60 Units into the skin 2 (two) times daily. 30 units in the morning and 60 units at night   Yes Historical Provider, MD  metFORMIN (GLUCOPHAGE) 500 MG tablet Take 1 tablet (500 mg total) by mouth 2 (two) times daily with a meal. 08/21/13  Yes Doug SouSam Jacubowitz, MD   naproxen (NAPROSYN) 500 MG tablet Take 1 tablet (500 mg total) by mouth 2 (two) times daily as needed for mild pain, moderate pain or headache (TAKE WITH MEALS.). 11/23/13  Yes Mercedes Strupp Camprubi-Soms, PA-C  oxybutynin (DITROPAN) 5 MG tablet Take 5 mg by mouth every 8 (eight) hours as needed for bladder spasms.    Yes Historical Provider, MD  oxyCODONE-acetaminophen (PERCOCET/ROXICET) 5-325 MG per tablet Take 2 tablets by mouth every 4 (four) hours as needed for severe pain. 11/25/13  Yes Joya Gaskinsonald W Wickline, MD  sitaGLIPtin (JANUVIA) 100 MG tablet Take 100 mg by mouth daily.   Yes Historical Provider, MD  tiotropium (SPIRIVA) 18 MCG inhalation capsule Place 18 mcg into inhaler and inhale daily as needed (shortness of breath).    Yes Historical Provider, MD  doxycycline (VIBRAMYCIN) 100 MG capsule Take 1 capsule (100 mg total) by mouth 2 (two) times daily. One po bid x 7 days Patient not taking: Reported on 02/10/2014 11/23/13   [name], PA-C  fluconazole (DIFLUCAN) 150 MG tablet Take 1 tablet (150 mg total) by mouth once. Patient not taking: Reported on 02/10/2014 11/23/13   [name], PA-C  ibuprofen (ADVIL,MOTRIN) 800 MG tablet Take 800 mg by mouth 3 (three) times daily as needed. 10/17/13   Historical Provider, MD  nitrofurantoin, macrocrystal-monohydrate, (MACROBID) 100 MG capsule Take 1 capsule (100 mg total) by mouth 2 (two) times daily. X 7 days Patient not taking: Reported on 02/10/2014 11/23/13   [name], PA-C   Triage Vitals: BP 120/73 mmHg  Pulse 98  Temp(Src) 98.8 F (37.1 C) (Oral)  Resp 14  Ht 5\' 7"  (1.702 m)  Wt 323 lb (146.512 kg)  BMI 50.58 kg/m2  SpO2 98%  LMP 01/30/2014  Physical Exam  Constitutional: She is oriented to person, place, and time. She appears well-developed and well-nourished. No distress.  Morbidly obese.  HENT:  Head: Normocephalic and atraumatic.  Right Ear: External ear normal.  Left  Ear: External ear normal.  Nose: Nose normal.  Eyes: Right eye exhibits no discharge. Left eye exhibits no discharge.  Neck: Neck supple. No tracheal deviation present.  Cardiovascular: Normal rate, regular rhythm and normal heart sounds.   Pulmonary/Chest: Effort normal and breath sounds normal. No respiratory distress.  Abdominal: Soft. There is tenderness.  Epigastric and LUQ tenderness.  Neurological: She is alert and oriented to person, place, and time.  Skin: Skin is warm and dry.  Psychiatric: She has a normal mood and affect. Her behavior is normal.  Nursing note and vitals reviewed.  ED Course  Procedures (including critical care time)  DIAGNOSTIC STUDIES: Oxygen Saturation is 98% on RA, normal by my interpretation.    COORDINATION OF CARE: 11:13 PM- Discussed plans to order diagnostic ultrasound of abdomen and lab work. Will give patient IV fluids, Zofran 4mg , Morphine 8mg  and Percocet/Roxicet. Pt advised of plan for treatment and pt agrees.  Labs Review Labs Reviewed  CBC WITH DIFFERENTIAL/PLATELET - Abnormal; Notable for the following:    WBC 10.6 (*)    All other  components within normal limits  COMPREHENSIVE METABOLIC PANEL - Abnormal; Notable for the following:    Sodium 134 (*)    Glucose, Bld 308 (*)    Albumin 2.6 (*)    All other components within normal limits  URINALYSIS, ROUTINE W REFLEX MICROSCOPIC - Abnormal; Notable for the following:    Specific Gravity, Urine 1.039 (*)    Glucose, UA >1000 (*)    Bilirubin Urine SMALL (*)    Ketones, ur 15 (*)    Protein, ur 30 (*)    Urobilinogen, UA 4.0 (*)    All other components within normal limits  URINE MICROSCOPIC-ADD ON - Abnormal; Notable for the following:    Squamous Epithelial / LPF FEW (*)    All other components within normal limits  LIPASE, BLOOD  PREGNANCY, URINE   Imaging Review US Abdomen Complete  02/11/2014   CLINICAL DATA:  Abdominal pain.  EXAM: ULTRASOUND ABDOMEN COMPLETE  COMPARISON:   CT abdomen 10/10/2013  FINDINGS: Gallbladder: Contains sludge and multiple dependent stones. No gallbladder wall thickening. Sonographic Eulah Pont sign is negative.  Common bile duct: Diameter: 5.7 mm.  Liver: No focal lesion identified. Mildly heterogeneous in parenchymal echogenicity. Limited visualization due to body habitus.  IVC: No abnormality visualized.  Pancreas: Not well seen due to overlying bowel gas.  Spleen: Size and appearance within normal limits.  Right Kidney: Length: Prominent measure 14.9 cm. Echogenicity within normal limits. No mass or hydronephrosis visualized.  Left Kidney: Length: Prominent measuring 14.1 cm. Echogenicity within normal limits. No mass or hydronephrosis visualized.  Abdominal aorta: No aneurysm visualized.  Other findings: None.  IMPRESSION: 1. Sludge and stones in the gallbladder. No sonographic findings of acute cholecystitis. 2. Nephromegaly without localizing renal abnormalities. This may be normal for patient body habitus.   Electronically Signed   By: Rubye Oaks M.D.   On: 02/11/2014 04:24     EKG Interpretation None     MDM   Final diagnoses:  Upper abdominal pain  Calculus of gallbladder without cholecystitis without obstruction    Patient with morbid obesity which makes exam difficult. However her pain seems to be localized to left upper abdomen. Similar pain a few months ago with benign CT except gallstones. Given this, ultrasound obtained, no evidence of cholecystitis. Feels improved. No GU/vaginal symptoms. Doubt appendicitis given length of symptoms. Will treat pain and recommend PPI and PCP f/u.  I personally performed the services described in this documentation, which was scribed in my presence. The recorded information has been reviewed and is accurate.   Audree Camel, MD 02/11/14 [phone]

## 2014-02-10 NOTE — ED Notes (Signed)
The pt is c/o lower abd cramps and lower back pain for 3  Weeks lmpo jan 25

## 2014-02-11 ENCOUNTER — Emergency Department (HOSPITAL_COMMUNITY): Payer: Medicaid Other

## 2014-02-11 MED ORDER — MORPHINE SULFATE 4 MG/ML IJ SOLN
8.0000 mg | Freq: Once | INTRAMUSCULAR | Status: AC
  Administered 2014-02-11: 8 mg via INTRAVENOUS
  Filled 2014-02-11: qty 2

## 2014-02-11 MED ORDER — OXYCODONE-ACETAMINOPHEN 5-325 MG PO TABS: 1.0000 | ORAL_TABLET | Freq: Four times a day (QID) | ORAL | Status: DC | PRN

## 2014-02-11 NOTE — Discharge Instructions (Signed)
Abdominal Pain °Many things can cause abdominal pain. Usually, abdominal pain is not caused by a disease and will improve without treatment. It can often be observed and treated at home. Your health care provider will do a physical exam and possibly order blood tests and X-rays to help determine the seriousness of your pain. However, in many cases, more time must pass before a clear cause of the pain can be found. Before that point, your health care provider may not know if you need more testing or further treatment. °HOME CARE INSTRUCTIONS  °Monitor your abdominal pain for any changes. The following actions may help to alleviate any discomfort you are experiencing: °· Only take over-the-counter or prescription medicines as directed by your health care provider. °· Do not take laxatives unless directed to do so by your health care provider. °· Try a clear liquid diet (broth, tea, or water) as directed by your health care provider. Slowly move to a bland diet as tolerated. °SEEK MEDICAL CARE IF: °· You have unexplained abdominal pain. °· You have abdominal pain associated with nausea or diarrhea. °· You have pain when you urinate or have a bowel movement. °· You experience abdominal pain that wakes you in the night. °· You have abdominal pain that is worsened or improved by eating food. °· You have abdominal pain that is worsened with eating fatty foods. °· You have a fever. °SEEK IMMEDIATE MEDICAL CARE IF:  °· Your pain does not go away within 2 hours. °· You keep throwing up (vomiting). °· Your pain is felt only in portions of the abdomen, such as the right side or the left lower portion of the abdomen. °· You pass bloody or black tarry stools. °MAKE SURE YOU: °· Understand these instructions.   °· Will watch your condition.   °· Will get help right away if you are not doing well or get worse.   °Document Released: 10/02/2004 Document Revised: 12/28/2012 Document Reviewed: 09/01/2012 °[employer]® Patient Information  ©2015 [employer]. This information is not intended to replace advice given to you by your health care provider. Make sure you discuss any questions you have with your health care provider. ° °Cholelithiasis °Cholelithiasis (also called gallstones) is a form of gallbladder disease in which gallstones form in your gallbladder. The gallbladder is an organ that stores bile made in the liver, which helps digest fats. Gallstones begin as small crystals and slowly grow into stones. Gallstone pain occurs when the gallbladder spasms and a gallstone is blocking the duct. Pain can also occur when a stone passes out of the duct.  °RISK FACTORS °· Being female.   °· Having multiple pregnancies. Health care providers sometimes advise removing diseased gallbladders before future pregnancies.   °· Being obese. °· Eating a diet heavy in fried foods and fat.   °· Being older than 60 years and increasing age.   °· Prolonged use of medicines containing female hormones.   °· Having diabetes mellitus.   °· Rapidly losing weight.   °· Having a family history of gallstones (heredity).   °SYMPTOMS °· Nausea.   °· Vomiting. °· Abdominal pain.   °· Yellowing of the skin (jaundice).   °· Sudden pain. It may persist from several minutes to several hours. °· Fever.   °· Tenderness to the touch.  °In some cases, when gallstones do not move into the bile duct, people have no pain or symptoms. These are called "silent" gallstones.  °TREATMENT °Silent gallstones do not need treatment. In severe cases, emergency surgery may be required. Options for treatment include: °· Surgery to remove   the gallbladder. This is the most common treatment. °· Medicines. These do not always work and may take 6-12 months or more to work. °· Shock wave treatment (extracorporeal biliary lithotripsy). In this treatment an ultrasound machine sends shock waves to the gallbladder to break gallstones into smaller pieces that can pass into the intestines or be dissolved by  medicine. °HOME CARE INSTRUCTIONS  °· Only take over-the-counter or prescription medicines for pain, discomfort, or fever as directed by your health care provider.   °· Follow a low-fat diet until seen again by your health care provider. Fat causes the gallbladder to contract, which can result in pain.   °· Follow up with your health care provider as directed. Attacks are almost always recurrent and surgery is usually required for permanent treatment.   °SEEK IMMEDIATE MEDICAL CARE IF:  °· Your pain increases and is not controlled by medicines.   °· You have a fever or persistent symptoms for more than 2-3 days.   °· You have a fever and your symptoms suddenly get worse.   °· You have persistent nausea and vomiting.   °MAKE SURE YOU:  °· Understand these instructions. °· Will watch your condition. °· Will get help right away if you are not doing well or get worse. °Document Released: 12/19/2004 Document Revised: 08/25/2012 Document Reviewed: 06/16/2012 °ExitCare® Patient Information ©2015 [employer]. This information is not intended to replace advice given to you by your health care provider. Make sure you discuss any questions you have with your health care provider. ° °

## 2014-02-11 NOTE — ED Notes (Signed)
Patient transported to Ultrasound 

## 2014-02-11 NOTE — ED Notes (Signed)
Per US tech - pt still has multiple pts ahead of her before her exam.  Request for expedition due to pt's only remaining exam put in with tech.  Will follow up.

## 2014-03-07 ENCOUNTER — Emergency Department (HOSPITAL_BASED_OUTPATIENT_CLINIC_OR_DEPARTMENT_OTHER)
Admission: EM | Admit: 2014-03-07 | Discharge: 2014-03-07 | Disposition: A | Discharge status: Home / Self Care | Payer: Medicaid Other | Attending: Emergency Medicine | Admitting: Emergency Medicine

## 2014-03-07 ENCOUNTER — Encounter (HOSPITAL_BASED_OUTPATIENT_CLINIC_OR_DEPARTMENT_OTHER): Payer: Self-pay | Admitting: *Deleted

## 2014-03-07 DIAGNOSIS — I10 Essential (primary) hypertension: Secondary | ICD-10-CM | POA: Insufficient documentation

## 2014-03-07 DIAGNOSIS — Z794 Long term (current) use of insulin: Secondary | ICD-10-CM | POA: Diagnosis not present

## 2014-03-07 DIAGNOSIS — R102 Pelvic and perineal pain: Secondary | ICD-10-CM

## 2014-03-07 DIAGNOSIS — Z791 Long term (current) use of non-steroidal anti-inflammatories (NSAID): Secondary | ICD-10-CM | POA: Insufficient documentation

## 2014-03-07 DIAGNOSIS — Z9889 Other specified postprocedural states: Secondary | ICD-10-CM | POA: Diagnosis not present

## 2014-03-07 DIAGNOSIS — Z7952 Long term (current) use of systemic steroids: Secondary | ICD-10-CM | POA: Diagnosis not present

## 2014-03-07 DIAGNOSIS — E119 Type 2 diabetes mellitus without complications: Secondary | ICD-10-CM | POA: Insufficient documentation

## 2014-03-07 DIAGNOSIS — E669 Obesity, unspecified: Secondary | ICD-10-CM | POA: Diagnosis not present

## 2014-03-07 DIAGNOSIS — K8020 Calculus of gallbladder without cholecystitis without obstruction: Secondary | ICD-10-CM | POA: Diagnosis not present

## 2014-03-07 DIAGNOSIS — R109 Unspecified abdominal pain: Secondary | ICD-10-CM | POA: Diagnosis present

## 2014-03-07 DIAGNOSIS — Z79899 Other long term (current) drug therapy: Secondary | ICD-10-CM | POA: Insufficient documentation

## 2014-03-07 DIAGNOSIS — J449 Chronic obstructive pulmonary disease, unspecified: Secondary | ICD-10-CM | POA: Insufficient documentation

## 2014-03-07 DIAGNOSIS — Z3202 Encounter for pregnancy test, result negative: Secondary | ICD-10-CM | POA: Insufficient documentation

## 2014-03-07 LAB — URINALYSIS, ROUTINE W REFLEX MICROSCOPIC
Hgb urine dipstick: NEGATIVE
KETONES UR: 15 mg/dL — AB
Leukocytes, UA: NEGATIVE
Nitrite: NEGATIVE
PROTEIN: NEGATIVE mg/dL
Specific Gravity, Urine: >1.046 — ABNORMAL HIGH (ref 1.005–1.030)
Urobilinogen, UA: 1 mg/dL (ref 0.0–1.0)
pH: 6 (ref 5.0–8.0)

## 2014-03-07 LAB — COMPREHENSIVE METABOLIC PANEL
ALBUMIN: 3.1 g/dL — AB (ref 3.5–5.2)
ALT: 44 U/L — AB (ref 0–35)
AST: 41 U/L — AB (ref 0–37)
Alkaline Phosphatase: 110 U/L (ref 39–117)
Anion gap: 2 — ABNORMAL LOW (ref 5–15)
BUN: 7 mg/dL (ref 6–23)
CHLORIDE: 104 mmol/L (ref 96–112)
CO2: 24 mmol/L (ref 19–32)
Calcium: 8.1 mg/dL — ABNORMAL LOW (ref 8.4–10.5)
Creatinine, Ser: 0.74 mg/dL (ref 0.50–1.10)
GFR calc Af Amer: >90 mL/min (ref >90)
GFR calc non Af Amer: >90 mL/min (ref >90)
Glucose, Bld: 307 mg/dL — ABNORMAL HIGH (ref 70–99)
POTASSIUM: 3.8 mmol/L (ref 3.5–5.1)
SODIUM: 130 mmol/L — AB (ref 135–145)
Total Bilirubin: 0.5 mg/dL (ref 0.3–1.2)
Total Protein: 8.8 g/dL — ABNORMAL HIGH (ref 6.0–8.3)

## 2014-03-07 LAB — CBC WITH DIFFERENTIAL/PLATELET
Basophils Absolute: 0 10*3/uL (ref 0.0–0.1)
Basophils Relative: 0 % (ref 0–1)
Eosinophils Absolute: 0.1 10*3/uL (ref 0.0–0.7)
Eosinophils Relative: 1 % (ref 0–5)
HCT: 39 % (ref 36.0–46.0)
HEMOGLOBIN: 12.9 g/dL (ref 12.0–15.0)
LYMPHS ABS: 4.3 10*3/uL — AB (ref 0.7–4.0)
Lymphocytes Relative: 42 % (ref 12–46)
MCH: 29.1 pg (ref 26.0–34.0)
MCHC: 33.1 g/dL (ref 30.0–36.0)
MCV: 88 fL (ref 78.0–100.0)
MONOS PCT: 9 % (ref 3–12)
Monocytes Absolute: 0.9 10*3/uL (ref 0.1–1.0)
NEUTROS ABS: 4.8 10*3/uL (ref 1.7–7.7)
NEUTROS PCT: 48 % (ref 43–77)
Platelets: 290 10*3/uL (ref 150–400)
RBC: 4.43 MIL/uL (ref 3.87–5.11)
RDW: 12.6 % (ref 11.5–15.5)
WBC: 10.2 10*3/uL (ref 4.0–10.5)

## 2014-03-07 LAB — WET PREP, GENITAL
Clue Cells Wet Prep HPF POC: NONE SEEN
TRICH WET PREP: NONE SEEN
YEAST WET PREP: NONE SEEN

## 2014-03-07 LAB — URINE MICROSCOPIC-ADD ON

## 2014-03-07 LAB — CBG MONITORING, ED: Glucose-Capillary: 255 mg/dL — ABNORMAL HIGH (ref 70–99)

## 2014-03-07 LAB — LIPASE, BLOOD: Lipase: 24 U/L (ref 11–59)

## 2014-03-07 LAB — PREGNANCY, URINE: Preg Test, Ur: NEGATIVE

## 2014-03-07 MED ORDER — AZITHROMYCIN 250 MG PO TABS
1000.0000 mg | ORAL_TABLET | Freq: Once | ORAL | Status: AC
  Administered 2014-03-07: 1000 mg via ORAL
  Filled 2014-03-07: qty 4

## 2014-03-07 MED ORDER — CEFTRIAXONE SODIUM 250 MG IJ SOLR
250.0000 mg | Freq: Once | INTRAMUSCULAR | Status: AC
  Administered 2014-03-07: 250 mg via INTRAMUSCULAR
  Filled 2014-03-07: qty 250

## 2014-03-07 MED ORDER — MORPHINE SULFATE 4 MG/ML IJ SOLN
4.0000 mg | Freq: Once | INTRAMUSCULAR | Status: AC
  Administered 2014-03-07: 4 mg via INTRAVENOUS
  Filled 2014-03-07: qty 1

## 2014-03-07 MED ORDER — TRAMADOL HCL 50 MG PO TABS: 50.0000 mg | ORAL_TABLET | Freq: Four times a day (QID) | ORAL | Status: DC | PRN

## 2014-03-07 MED ORDER — ONDANSETRON HCL 4 MG/2ML IJ SOLN
4.0000 mg | Freq: Once | INTRAMUSCULAR | Status: AC
  Administered 2014-03-07: 4 mg via INTRAVENOUS
  Filled 2014-03-07: qty 2

## 2014-03-07 NOTE — ED Notes (Signed)
Abdominal pain in her lower abdomen and a separate pain in her right upper quad that she states is a result of known gallstones.

## 2014-03-07 NOTE — ED Provider Notes (Signed)
CSN: [id number]     Arrival date & time 03/07/14  1957 History   This chart was scribed for Rolan BuccoMelanie Roopa Graver, MD by Evon Slackerrance Branch, ED Scribe. This patient was seen in room MH10/MH10 and the patient's care was started at 9:31 PM.     Chief Complaint  Patient presents with  . Abdominal Pain    The history is provided by the patient. No language interpreter was used.   HPI Comments: Sheila Gibson is a 44 y.o. female with PMHx of diabetes, COPD, HTN and gallstones who presents to the Emergency Department complaining of new low and recurrent RUQ abdominal pain onset 3 days prior. Pt states the RUQ pain is recurrent and feels similar to previous gallstone pain. Pt states that the pain is worse when standing or lying down. Pt states that she takes 800 mg ibuprofen that has recently not provided any relief. She now has some new abd pain in her lower abdomen which has been there for the past 3-4 days.  Denies vaginal discharge, vaginal bleeding, dysuria, vomiting or nausea. Pt states she has PSHx of cesarean section and thyroidectomy.      Past Medical History  Diagnosis Date  . Diabetes mellitus without complication   . COPD (chronic obstructive pulmonary disease)   . Obesity   . Hypertension    Past Surgical History  Procedure Laterality Date  . Thyroidectomy    . Cesarean section     No family history on file. History  Substance Use Topics  . Smoking status: Never Smoker   . Smokeless tobacco: Not on file  . Alcohol Use: No   OB History    No data available     Review of Systems  Constitutional: Negative for fever, chills, diaphoresis and fatigue.  HENT: Negative for congestion, rhinorrhea and sneezing.   Eyes: Negative.   Respiratory: Negative for cough, chest tightness and shortness of breath.   Cardiovascular: Negative for chest pain and leg swelling.  Gastrointestinal: Positive for abdominal pain. Negative for nausea, vomiting, diarrhea and blood in stool.  Genitourinary:  Negative for dysuria, frequency, hematuria, flank pain, vaginal bleeding, vaginal discharge and difficulty urinating.  Musculoskeletal: Negative for back pain and arthralgias.  Skin: Negative for rash.  Neurological: Negative for dizziness, speech difficulty, weakness, numbness and headaches.      Allergies  Review of patient's allergies indicates no known allergies.  Home Medications   Prior to Admission medications   Medication Sig Start Date End Date Taking? Authorizing Provider  albuterol (PROVENTIL HFA;VENTOLIN HFA) 108 (90 BASE) MCG/ACT inhaler Inhale 1-2 puffs into the lungs every 6 (six) hours as needed for wheezing or shortness of breath.    Historical Provider, MD  albuterol (PROVENTIL) (2.5 MG/3ML) 0.083% nebulizer solution Take 2.5 mg by nebulization 2 (two) times daily.    Historical Provider, MD  beclomethasone (QVAR) 80 MCG/ACT inhaler Inhale 2 puffs into the lungs 2 (two) times daily.    Historical Provider, MD  Canagliflozin (INVOKANA) 100 MG TABS Take 100 mg by mouth daily.     Historical Provider, MD  doxycycline (VIBRAMYCIN) 100 MG capsule Take 1 capsule (100 mg total) by mouth 2 (two) times daily. One po bid x 7 days Patient not taking: Reported on 02/10/2014 11/23/13   [name], PA-C  fluconazole (DIFLUCAN) 150 MG tablet Take 1 tablet (150 mg total) by mouth once. Patient not taking: Reported on 02/10/2014 11/23/13   [name], PA-C  ibuprofen (ADVIL,MOTRIN) 800 MG tablet Take 800  mg by mouth 3 (three) times daily as needed. 10/17/13   Historical Provider, MD  insulin glargine (LANTUS) 100 unit/mL SOPN Inject 30-60 Units into the skin 2 (two) times daily. 30 units in the morning and 60 units at night    Historical Provider, MD  metFORMIN (GLUCOPHAGE) 500 MG tablet Take 1 tablet (500 mg total) by mouth 2 (two) times daily with a meal. 08/21/13   Doug Sou, MD  naproxen (NAPROSYN) 500 MG tablet Take 1 tablet (500 mg total) by  mouth 2 (two) times daily as needed for mild pain, moderate pain or headache (TAKE WITH MEALS.). 11/23/13   Mercedes Strupp Camprubi-Soms, PA-C  nitrofurantoin, macrocrystal-monohydrate, (MACROBID) 100 MG capsule Take 1 capsule (100 mg total) by mouth 2 (two) times daily. X 7 days Patient not taking: Reported on 02/10/2014 11/23/13   Don[place]ita [place] Camprubi-Soms, PA-C  oxybutynin (DITROPAN) 5 MG tablet Take 5 mg by mouth every 8 (eight) hours as needed for bladder spasms.     Historical Provider, MD  oxyCODONE-acetaminophen (PERCOCET) 5-325 MG per tablet Take 1-2 tablets by mouth every 6 (six) hours as needed for severe pain. 02/11/14   Audree Camel, MD  sitaGLIPtin (JANUVIA) 100 MG tablet Take 100 mg by mouth daily.    Historical Provider, MD  tiotropium (SPIRIVA) 18 MCG inhalation capsule Place 18 mcg into inhaler and inhale daily as needed (shortness of breath).     Historical Provider, MD  traMADol (ULTRAM) 50 MG tablet Take 1 tablet (50 mg total) by mouth every 6 (six) hours as needed. 03/07/14   Rolan Bucco, MD   BP 140/95 mmHg  Pulse 105  Temp(Src) 98.6 F (37 C) (Oral)  Resp 18  Ht  (1.702 m)  Wt 323 lb (146.512 kg)  BMI 50.58 kg/m2  SpO2 100%  LMP 01/30/2014   Physical Exam  Constitutional: She is oriented to person, place, and time. She appears well-developed and well-nourished.  HENT:  Head: Normocephalic and atraumatic.  Eyes: Pupils are equal, round, and reactive to light.  Neck: Normal range of motion. Neck supple.  Cardiovascular: Normal rate, regular rhythm and normal heart sounds.   Pulmonary/Chest: Effort normal and breath sounds normal. No respiratory distress. She has no wheezes. She has no rales. She exhibits no tenderness.  Abdominal: Soft. Bowel sounds are normal. There is tenderness (mild TTP to epigastrium and RUQ.  +TTP suprapubic region). There is no rebound and no guarding.  Genitourinary:  White vaginal discharge.  No CMT, no adnexal tenderness.   +Tenderness to suprapubic area.  Musculoskeletal: Normal range of motion. She exhibits no edema.  Lymphadenopathy:    She has no cervical adenopathy.  Neurological: She is alert and oriented to person, place, and time.  Skin: Skin is warm and dry. No rash noted.  Psychiatric: She has a normal mood and affect.    ED Course  Procedures (including critical care time) DIAGNOSTIC STUDIES: Oxygen Saturation is 100% on RA, normal by my interpretation.    COORDINATION OF CARE: 9:37 PM-Discussed treatment plan with pt at bedside and pt agreed to plan.     Labs Review Labs Reviewed  WET PREP, GENITAL - Abnormal; Notable for the following:    WBC, Wet Prep HPF POC FEW (*)    All other components within normal limits  URINALYSIS, ROUTINE W REFLEX MICROSCOPIC - Abnormal; Notable for the following:    Specific Gravity, Urine >1.046 (*)    Glucose, UA >1000 (*)    Bilirubin Urine SMALL (*)  Ketones, ur 15 (*)    All other components within normal limits  URINE MICROSCOPIC-ADD ON - Abnormal; Notable for the following:    Squamous Epithelial / LPF FEW (*)    All other components within normal limits  COMPREHENSIVE METABOLIC PANEL - Abnormal; Notable for the following:    Sodium 130 (*)    Glucose, Bld 307 (*)    Calcium 8.1 (*)    Total Protein 8.8 (*)    Albumin 3.1 (*)    AST 41 (*)    ALT 44 (*)    Anion gap 2 (*)    All other components within normal limits  CBC WITH DIFFERENTIAL/PLATELET - Abnormal; Notable for the following:    Lymphs Abs 4.3 (*)    All other components within normal limits  CBG MONITORING, ED - Abnormal; Notable for the following:    Glucose-Capillary 255 (*)    All other components within normal limits  PREGNANCY, URINE  LIPASE, BLOOD  HIV ANTIBODY (ROUTINE TESTING)  RPR  GC/CHLAMYDIA PROBE AMP ([place])    Imaging Review No results found.   EKG Interpretation None      MDM   Final diagnoses:  Gallstones  Pelvic pain in female    Patient presents with upper and lower abdominal pain. Her upper abdominal pain is consistent with her past biliary colic. She seen a surgeon in the past who has not recommended surgery due to her weight. She has no change in her pain. She has no fevers. Her LFTs are mildly elevated but they have been in the past as well. She has no indications of an acute inflammation of her gallbladder. She also has pain in her suprapubic area. Her pelvic exam shows some vaginal discharge and she was treated presumptively with Zithromax and Rocephin. She has no evidence of PID clinically. She was given morphine here in the ED and is feeling better. She's had no vomiting or fevers. She was discharged home in good condition. She was given a prescription for Ultram to use at home for pain. She was encouraged to follow-up with her primary care physician if her symptoms are not improving. She was advised to return here if she has any worsening pain vomiting or fevers.   I personally performed the services described in this documentation, which was scribed in my presence.  The recorded information has been reviewed and considered.      Rolan Bucco, MD 03/07/14 2312

## 2014-03-07 NOTE — Discharge Instructions (Signed)
Cholelithiasis Cholelithiasis (also called gallstones) is a form of gallbladder disease in which gallstones form in your gallbladder. The gallbladder is an organ that stores bile made in the liver, which helps digest fats. Gallstones begin as small crystals and slowly grow into stones. Gallstone pain occurs when the gallbladder spasms and a gallstone is blocking the duct. Pain can also occur when a stone passes out of the duct.  RISK FACTORS  Being female.   Having multiple pregnancies. Health care providers sometimes advise removing diseased gallbladders before future pregnancies.   Being obese.  Eating a diet heavy in fried foods and fat.   Being older than 60 years and increasing age.   Prolonged use of medicines containing female hormones.   Having diabetes mellitus.   Rapidly losing weight.   Having a family history of gallstones (heredity).  SYMPTOMS  Nausea.   Vomiting.  Abdominal pain.   Yellowing of the skin (jaundice).   Sudden pain. It may persist from several minutes to several hours.  Fever.   Tenderness to the touch. In some cases, when gallstones do not move into the bile duct, people have no pain or symptoms. These are called "silent" gallstones.  TREATMENT Silent gallstones do not need treatment. In severe cases, emergency surgery may be required. Options for treatment include:  Surgery to remove the gallbladder. This is the most common treatment.  Medicines. These do not always work and may take 6-12 months or more to work.  Shock wave treatment (extracorporeal biliary lithotripsy). In this treatment an ultrasound machine sends shock waves to the gallbladder to break gallstones into smaller pieces that can pass into the intestines or be dissolved by medicine. HOME CARE INSTRUCTIONS   Only take over-the-counter or prescription medicines for pain, discomfort, or fever as directed by your health care provider.   Follow a low-fat diet until  seen again by your health care provider. Fat causes the gallbladder to contract, which can result in pain.   Follow up with your health care provider as directed. Attacks are almost always recurrent and surgery is usually required for permanent treatment.  SEEK IMMEDIATE MEDICAL CARE IF:   Your pain increases and is not controlled by medicines.   You have a fever or persistent symptoms for more than 2-3 days.   You have a fever and your symptoms suddenly get worse.   You have persistent nausea and vomiting.  MAKE SURE YOU:   Understand these instructions.  Will watch your condition.  Will get help right away if you are not doing well or get worse. Document Released: 12/19/2004 Document Revised: 08/25/2012 Document Reviewed: 06/16/2012 [facility] Patient Information 2015 [place], [place]. This information is not intended to replace advice given to you by your health care provider. Make sure you discuss any questions you have with your health care provider.  Pelvic Pain Female pelvic pain can be caused by many different things and start from a variety of places. Pelvic pain refers to pain that is located in the lower half of the abdomen and between your hips. The pain may occur over a short period of time (acute) or may be reoccurring (chronic). The cause of pelvic pain may be related to disorders affecting the female reproductive organs (gynecologic), but it may also be related to the bladder, kidney stones, an intestinal complication, or muscle or skeletal problems. Getting help right away for pelvic pain is important, especially if there has been severe, sharp, or a sudden onset of unusual pain. It  is also important to get help right away because some types of pelvic pain can be life threatening.  CAUSES  Below are only some of the causes of pelvic pain. The causes of pelvic pain can be in one of several categories.   Gynecologic.  Pelvic inflammatory disease.  Sexually transmitted  infection.  Ovarian cyst or a twisted ovarian ligament (ovarian torsion).  Uterine lining that grows outside the uterus (endometriosis).  Fibroids, cysts, or tumors.  Ovulation.  Pregnancy.  Pregnancy that occurs outside the uterus (ectopic pregnancy).  Miscarriage.  Labor.  Abruption of the placenta or ruptured uterus.  Infection.  Uterine infection (endometritis).  Bladder infection.  Diverticulitis.  Miscarriage related to a uterine infection (septic abortion).  Bladder.  Inflammation of the bladder (cystitis).  Kidney stone(s).  Gastrointestinal.  Constipation.  Diverticulitis.  Neurologic.  Trauma.  Feeling pelvic pain because of mental or emotional causes (psychosomatic).  Cancers of the bowel or pelvis. EVALUATION  Your caregiver will want to take a careful history of your concerns. This includes recent changes in your health, a careful gynecologic history of your periods (menses), and a sexual history. Obtaining your family history and medical history is also important. Your caregiver may suggest a pelvic exam. A pelvic exam will help identify the location and severity of the pain. It also helps in the evaluation of which organ system may be involved. In order to identify the cause of the pelvic pain and be properly treated, your caregiver may order tests. These tests may include:   A pregnancy test.  Pelvic ultrasonography.  An X-ray exam of the abdomen.  A urinalysis or evaluation of vaginal discharge.  Blood tests. HOME CARE INSTRUCTIONS   Only take over-the-counter or prescription medicines for pain, discomfort, or fever as directed by your caregiver.   Rest as directed by your caregiver.   Eat a balanced diet.   Drink enough fluids to make your urine clear or pale yellow, or as directed.   Avoid sexual intercourse if it causes pain.   Apply warm or cold compresses to the lower abdomen depending on which one helps the pain.    Avoid stressful situations.   Keep a journal of your pelvic pain. Write down when it started, where the pain is located, and if there are things that seem to be associated with the pain, such as food or your menstrual cycle.  Follow up with your caregiver as directed.  SEEK MEDICAL CARE IF:  Your medicine does not help your pain.  You have abnormal vaginal discharge. SEEK IMMEDIATE MEDICAL CARE IF:   You have heavy bleeding from the vagina.   Your pelvic pain increases.   You feel light-headed or faint.   You have chills.   You have pain with urination or blood in your urine.   You have uncontrolled diarrhea or vomiting.   You have a fever or persistent symptoms for more than 3 days.  You have a fever and your symptoms suddenly get worse.   You are being physically or sexually abused.  MAKE SURE YOU:  Understand these instructions.  Will watch your condition.  Will get help if you are not doing well or get worse. Document Released: 11/20/2003 Document Revised: 05/09/2013 Document Reviewed: 04/14/2011 [facility] Patient Information 2015 [place], [place]. This information is not intended to replace advice given to you by your health care provider. Make sure you discuss any questions you have with your health care provider.

## 2014-03-09 LAB — GC/CHLAMYDIA PROBE AMP (~~LOC~~) NOT AT ARMC
CHLAMYDIA, DNA PROBE: NEGATIVE
NEISSERIA GONORRHEA: NEGATIVE

## 2014-03-09 LAB — RPR: RPR Ser Ql: NONREACTIVE

## 2014-03-09 LAB — HIV ANTIBODY (ROUTINE TESTING W REFLEX): HIV SCREEN 4TH GENERATION: NONREACTIVE

## 2014-05-03 ENCOUNTER — Encounter (HOSPITAL_COMMUNITY): Payer: Self-pay | Admitting: Emergency Medicine

## 2014-05-03 ENCOUNTER — Emergency Department (HOSPITAL_COMMUNITY)
Admission: EM | Admit: 2014-05-03 | Discharge: 2014-05-03 | Disposition: A | Discharge status: Home / Self Care | Payer: Medicaid Other | Attending: Emergency Medicine | Admitting: Emergency Medicine

## 2014-05-03 DIAGNOSIS — E119 Type 2 diabetes mellitus without complications: Secondary | ICD-10-CM | POA: Diagnosis not present

## 2014-05-03 DIAGNOSIS — Z7951 Long term (current) use of inhaled steroids: Secondary | ICD-10-CM | POA: Insufficient documentation

## 2014-05-03 DIAGNOSIS — I10 Essential (primary) hypertension: Secondary | ICD-10-CM | POA: Diagnosis not present

## 2014-05-03 DIAGNOSIS — E669 Obesity, unspecified: Secondary | ICD-10-CM | POA: Diagnosis not present

## 2014-05-03 DIAGNOSIS — J449 Chronic obstructive pulmonary disease, unspecified: Secondary | ICD-10-CM | POA: Insufficient documentation

## 2014-05-03 DIAGNOSIS — Z79899 Other long term (current) drug therapy: Secondary | ICD-10-CM | POA: Insufficient documentation

## 2014-05-03 DIAGNOSIS — Z794 Long term (current) use of insulin: Secondary | ICD-10-CM | POA: Insufficient documentation

## 2014-05-03 DIAGNOSIS — M79641 Pain in right hand: Secondary | ICD-10-CM | POA: Diagnosis present

## 2014-05-03 DIAGNOSIS — L03011 Cellulitis of right finger: Secondary | ICD-10-CM | POA: Diagnosis not present

## 2014-05-03 MED ORDER — CEPHALEXIN 500 MG PO CAPS: 500.0000 mg | ORAL_CAPSULE | Freq: Four times a day (QID) | ORAL | Status: DC

## 2014-05-03 MED ORDER — SULFAMETHOXAZOLE-TRIMETHOPRIM 800-160 MG PO TABS: 1.0000 | ORAL_TABLET | Freq: Two times a day (BID) | ORAL | Status: DC

## 2014-05-03 NOTE — ED Provider Notes (Signed)
CSN: [id number]     Arrival date & time 05/03/14  1131 History  This chart was scribed for non-physician practitioner, Raymon Mutton, PA-C, working with Samuel Jester, DO, by Abel Presto, ED Scribe. This patient was seen in room TR11C/TR11C and the patient's care was started at 12:58 PM.    Chief Complaint  Patient presents with  . Hand Pain     Patient is a 44 y.o. female presenting with hand pain. The history is provided by the patient. No language interpreter was used.  Hand Pain Pertinent negatives include no chest pain and no shortness of breath.   HPI Comments: Sheila Gibson is a 44 y.o. female with PMHx of DM, COPD, and HTN who presents to the Emergency Department complaining of throbbing right pinky finger pain with onset 2 weeks ago with associated swelling, worsening 5 days ago. Pt states she had a hang nail that she removed at onset and notes swelling increased with associated warmth and mild tingling to tip of finger. She used a needle 5 days ago to make a puncture to area of swelling with some green drainage noted. Pt notes pain radiates from pinky, up right arm to right side of neck with onset 5 days ago, approximately 1 hour after she drained the area. Pt denies fever, chills, neck stiffness, dysphagia, numbness, blurred vision or sudden loss of vision, jaw pain, chest pain, SOB, and red streaking to finger.    PCP is Dr. Lacie Scotts.   Past Medical History  Diagnosis Date  . Diabetes mellitus without complication   . COPD (chronic obstructive pulmonary disease)   . Obesity   . Hypertension    Past Surgical History  Procedure Laterality Date  . Thyroidectomy    . Cesarean section     History reviewed. No pertinent family history. History  Substance Use Topics  . Smoking status: Never Smoker   . Smokeless tobacco: Not on file  . Alcohol Use: No   OB History    No data available     Review of Systems  Constitutional: Negative for fever and chills.   HENT: Negative for trouble swallowing.   Eyes: Negative for visual disturbance.  Respiratory: Negative for shortness of breath.   Cardiovascular: Negative for chest pain.  Musculoskeletal: Positive for myalgias and joint swelling. Negative for neck stiffness.      Allergies  Review of patient's allergies indicates no known allergies.  Home Medications   Prior to Admission medications   Medication Sig Start Date End Date Taking? Authorizing Provider  albuterol (PROVENTIL HFA;VENTOLIN HFA) 108 (90 BASE) MCG/ACT inhaler Inhale 1-2 puffs into the lungs every 6 (six) hours as needed for wheezing or shortness of breath.    Historical Provider, MD  albuterol (PROVENTIL) (2.5 MG/3ML) 0.083% nebulizer solution Take 2.5 mg by nebulization 2 (two) times daily.    Historical Provider, MD  beclomethasone (QVAR) 80 MCG/ACT inhaler Inhale 2 puffs into the lungs 2 (two) times daily.    Historical Provider, MD  Canagliflozin (INVOKANA) 100 MG TABS Take 100 mg by mouth daily.     Historical Provider, MD  cephALEXin (KEFLEX) 500 MG capsule Take 1 capsule (500 mg total) by mouth 4 (four) times daily. 05/03/14   Laine Fonner, PA-C  doxycycline (VIBRAMYCIN) 100 MG capsule Take 1 capsule (100 mg total) by mouth 2 (two) times daily. One po bid x 7 days Patient not taking: Reported on 02/10/2014 11/23/13   Mercedes Camprubi-Soms, PA-C  fluconazole (DIFLUCAN) 150 MG tablet Take 1  tablet (150 mg total) by mouth once. Patient not taking: Reported on 02/10/2014 11/23/13   Mercedes Camprubi-Soms, PA-C  ibuprofen (ADVIL,MOTRIN) 800 MG tablet Take 800 mg by mouth 3 (three) times daily as needed. 10/17/13   Historical Provider, MD  insulin glargine (LANTUS) 100 unit/mL SOPN Inject 30-60 Units into the skin 2 (two) times daily. 30 units in the morning and 60 units at night    Historical Provider, MD  metFORMIN (GLUCOPHAGE) 500 MG tablet Take 1 tablet (500 mg total) by mouth 2 (two) times daily with a meal. 08/21/13   Doug Sou, MD  naproxen (NAPROSYN) 500 MG tablet Take 1 tablet (500 mg total) by mouth 2 (two) times daily as needed for mild pain, moderate pain or headache (TAKE WITH MEALS.). 11/23/13   Mercedes Camprubi-Soms, PA-C  nitrofurantoin, macrocrystal-monohydrate, (MACROBID) 100 MG capsule Take 1 capsule (100 mg total) by mouth 2 (two) times daily. X 7 days Patient not taking: Reported on 02/10/2014 11/23/13   Mercedes Camprubi-Soms, PA-C  oxybutynin (DITROPAN) 5 MG tablet Take 5 mg by mouth every 8 (eight) hours as needed for bladder spasms.     Historical Provider, MD  oxyCODONE-acetaminophen (PERCOCET) 5-325 MG per tablet Take 1-2 tablets by mouth every 6 (six) hours as needed for severe pain. 02/11/14   Pricilla Loveless, MD  sitaGLIPtin (JANUVIA) 100 MG tablet Take 100 mg by mouth daily.    Historical Provider, MD  tiotropium (SPIRIVA) 18 MCG inhalation capsule Place 18 mcg into inhaler and inhale daily as needed (shortness of breath).     Historical Provider, MD  traMADol (ULTRAM) 50 MG tablet Take 1 tablet (50 mg total) by mouth every 6 (six) hours as needed. 03/07/14   Rolan Bucco, MD   BP 132/95 mmHg  Pulse 100  Temp(Src) 98.1 F (36.7 C) (Oral)  Resp 18  SpO2 97% Physical Exam  Constitutional: She is oriented to person, place, and time. She appears well-developed and well-nourished. No distress.  HENT:  Head: Normocephalic and atraumatic.  Mouth/Throat: Oropharynx is clear and moist. No oropharyngeal exudate.  Eyes: Conjunctivae and EOM are normal. Pupils are equal, round, and reactive to light.  Neck: Normal range of motion. Neck supple.  Cardiovascular: Normal rate, regular rhythm and normal heart sounds.   Pulses:      Radial pulses are 2+ on the right side, and 2+ on the left side.  Cap refill less than 3 seconds  Pulmonary/Chest: Effort normal and breath sounds normal. No respiratory distress. She has no wheezes. She has no rales.  Musculoskeletal: Normal range of motion.  Patient  is able to produce a cyst without difficulty. Full range of motion to the digits of the right hand, right wrist right elbow, right shoulder.  Neurological: She is alert and oriented to person, place, and time. No cranial nerve deficit. She exhibits normal muscle tone. Coordination normal.  Sensation intact with differentiation sharp and dull touch Equal grip strength bilaterally Strength intact upper extremities bilaterally, 5+/5+  Skin: Skin is warm and dry. No rash noted. She is not diaphoretic. No erythema.  Paronychia identified to the right small finger with negative active drainage or bleeding. Negative red streaks noted. Negative signs of septic joint.  Psychiatric: She has a normal mood and affect. Her behavior is normal. Thought content normal.  Nursing note and vitals reviewed.   ED Course  Procedures (including critical care time) DIAGNOSTIC STUDIES: Oxygen Saturation is 97% on room air, normal by my interpretation.    COORDINATION  OF CARE: 1:12 PM Discussed treatment plan with patient at beside, the patient agrees with the plan and has no further questions at this time.    Labs Review Labs Reviewed  WOUND CULTURE  CBG MONITORING, ED    Imaging Review No results found.   EKG Interpretation None     INCISION AND DRAINAGE PROCEDURE NOTE: Patient identification was confirmed and verbal consent was obtained. This procedure was performed by Raymon Mutton, PA-C  at 1:08 PM. Site: right pinky finger Sterile procedures observed Anesthetic used (type and amt): none Drainage: purulent drainage  Complexity: Complex Site anesthetized, incision made over site, wound drained and explored loculations, rinsed with copious amounts of normal saline, covered with dry, sterile dressing.  Pt tolerated procedure well without complications.  Instructions for care discussed verbally and pt provided with additional written instructions for homecare and f/u.   Meds ordered this  encounter  Medications  . DISCONTD: cephALEXin (KEFLEX) 500 MG capsule    Sig: Take 1 capsule (500 mg total) by mouth 4 (four) times daily.    Dispense:  28 capsule    Refill:  0    Order Specific Question:  Supervising Provider    Answer:  MILLER, BRIAN [[id number]]  . DISCONTD: sulfamethoxazole-trimethoprim (BACTRIM DS,SEPTRA DS) 800-160 MG per tablet    Sig: Take 1 tablet by mouth 2 (two) times daily.    Dispense:  14 tablet    Refill:  0    Order Specific Question:  Supervising Provider    Answer:  MILLER, BRIAN [[id number]]  . cephALEXin (KEFLEX) 500 MG capsule    Sig: Take 1 capsule (500 mg total) by mouth 4 (four) times daily.    Dispense:  28 capsule    Refill:  0    Order Specific Question:  Supervising Provider    Answer:  Eber Hong [[id number]]    MDM   Final diagnoses:  Paronychia of fifth finger of right hand    Medications - No data to display  Filed Vitals:   05/03/14 1137  BP: 132/95  Pulse: 100  Temp: 98.1 F (36.7 C)  TempSrc: Oral  Resp: 18  SpO2: 97%   I personally performed the services described in this documentation, which was scribed in my presence. The recorded information has been reviewed and is accurate.  Patient presenting to the ED with paronychia to the right small finger that occurred 5 days ago. Patient reports that she has noticed a hangnail about 2 weeks ago, used a needle with drainage of pus approximate 5 days ago. Patient is a diabetic, stated that she has not taken her medications today secondary to not eating. Patient reports that her sugar levels are between 200-300. Paronychia identified to the right small finger with negative red streaks. Full range of motion to the right hand without difficulty. Cap refill less than 3 seconds. Pulses palpable and strong. Full range of motion to the right upper extremity. Strength intact with equal distribution. Equal grip strength. Paronychia drained in ED setting with positive release of pus-wound culture  obtained. Negative signs of septic joint. Negative focal neurological deficits. Patient stable, afebrile. Patient not septic appearing. Negative signs of respiratory distress. Discharged patient. Referred patient to PCP, has an appointment on Monday, 05/07/2014-discussed with patient to keep appointment. Discharged patient with antibiotics. Discussed with patient to apply warm soaks massage. Discussed with patient to avoid any physical strenuous activity. Discussed with patient to closely monitor symptoms and if symptoms are to worsen or change to  report back to the ED - strict return instructions given.  Patient agreed to plan of care, understood, all questions answered.   Raymon MuttonMarissa Kayelynn Abdou, PA-C 05/03/14 1356  Samuel JesterKathleen McManus, DO 05/05/14 2153

## 2014-05-03 NOTE — Discharge Instructions (Signed)
Please call your doctor for a followup appointment within 24-48 hours. When you talk to your doctor please let them know that you were seen in the emergency department and have them acquire all of your records so that they can discuss the findings with you and formulate a treatment plan to fully care for your new and ongoing problems. Please follow-up to primary care provider Please take antibiotics as prescribed Please apply warm soaks to the finger and massage Please rest and stay hydrated Please continue to monitor symptoms closely and if symptoms are to worsen or change (fever greater than 101, chills, sweating, nausea, vomiting, chest pain, shortness of breathe, difficulty breathing, weakness, numbness, tingling, worsening or changes to pain pattern, or swelling, drainage, red streaks running down the arm, swelling to the hands or other extremities, neck swelling, neck stiffness, inability swallow, jaw pain) please report back to the Emergency Department immediately.    Paronychia Paronychia is an inflammatory reaction involving the folds of the skin surrounding the fingernail. This is commonly caused by an infection in the skin around a nail. The most common cause of paronychia is frequent wetting of the hands (as seen with bartenders, food servers, nurses or others who wet their hands). This makes the skin around the fingernail susceptible to infection by bacteria (germs) or fungus. Other predisposing factors are:  Aggressive manicuring.  Nail biting.  Thumb sucking. The most common cause is a staphylococcal (a type of germ) infection, or a fungal (Candida) infection. When caused by a germ, it usually comes on suddenly with redness, swelling, pus and is often painful. It may get under the nail and form an abscess (collection of pus), or form an abscess around the nail. If the nail itself is infected with a fungus, the treatment is usually prolonged and may require oral medicine for up to one  year. Your caregiver will determine the length of time treatment is required. The paronychia caused by bacteria (germs) may largely be avoided by not pulling on hangnails or picking at cuticles. When the infection occurs at the tips of the finger it is called felon. When the cause of paronychia is from the herpes simplex virus (HSV) it is called herpetic whitlow. TREATMENT  When an abscess is present treatment is often incision and drainage. This means that the abscess must be cut open so the pus can get out. When this is done, the following home care instructions should be followed. HOME CARE INSTRUCTIONS   It is important to keep the affected fingers very dry. Rubber or plastic gloves over cotton gloves should be used whenever the hand must be placed in water.  Keep wound clean, dry and dressed as suggested by your caregiver between warm soaks or warm compresses.  Soak in warm water for fifteen to twenty minutes three to four times per day for bacterial infections. Fungal infections are very difficult to treat, so often require treatment for long periods of time.  For bacterial (germ) infections take antibiotics (medicine which kill germs) as directed and finish the prescription, even if the problem appears to be solved before the medicine is gone.  Only take over-the-counter or prescription medicines for pain, discomfort, or fever as directed by your caregiver. SEEK IMMEDIATE MEDICAL CARE IF:  You have redness, swelling, or increasing pain in the wound.  You notice pus coming from the wound.  You have a fever.  You notice a bad smell coming from the wound or dressing. Document Released: 06/18/2000 Document Revised: 03/17/2011  Document Reviewed: 02/18/2008 [facility] Patient Information 2015 [employer], [place]. This information is not intended to replace advice given to you by your health care provider. Make sure you discuss any questions you have with your health care provider.

## 2014-05-03 NOTE — ED Notes (Signed)
Pt c/o right hand pain after having some purulent discharge from cuticle on pinky; mild swelling noted; pt sts pain into arm at present

## 2014-05-03 NOTE — ED Notes (Signed)
CBG 317.  PT reports CBG runs in the 200-300's normally.

## 2014-05-04 LAB — CBG MONITORING, ED: Glucose-Capillary: 317 mg/dL — ABNORMAL HIGH (ref 70–99)

## 2014-05-07 ENCOUNTER — Telehealth (HOSPITAL_BASED_OUTPATIENT_CLINIC_OR_DEPARTMENT_OTHER): Payer: Self-pay | Admitting: Emergency Medicine

## 2014-05-07 LAB — WOUND CULTURE: GRAM STAIN: NONE SEEN

## 2014-05-07 NOTE — Telephone Encounter (Signed)
Lab called + wound culture + MRSA Chart sent to EDP for review

## 2014-05-08 NOTE — Telephone Encounter (Signed)
Post ED Visit - Positive Culture Follow-up  Culture report reviewed by antimicrobial stewardship pharmacist: []  Wes Dulaney, Pharm.D., BCPS [x]  Celedonio MiyamotoJeremy Frens, Pharm.D., BCPS []  Georgina PillionElizabeth Martin, Pharm.D., BCPS []  Haiku-PauwelaMinh Pham, [address]Pharm.D., BCPS, AAHIVP []  Estella HuskMichelle Turner, Pharm.D., BCPS, AAHIVP []  Elder CyphersLorie Poole, [address]Pharm[address]D., BCPS  Positive Wound culture -> Few MRSA Treated with Cephalexin, Sulfa Trimeth & Cephalexin, organism sensitive to Trimeth Sulf.  Call and notify pt   Arvid RightClark, Kayle Correa Dorn 05/08/2014, 6:52 PM

## 2014-05-09 ENCOUNTER — Encounter (HOSPITAL_BASED_OUTPATIENT_CLINIC_OR_DEPARTMENT_OTHER): Payer: Self-pay

## 2014-05-09 ENCOUNTER — Emergency Department (HOSPITAL_BASED_OUTPATIENT_CLINIC_OR_DEPARTMENT_OTHER)
Admission: EM | Admit: 2014-05-09 | Discharge: 2014-05-10 | Disposition: A | Discharge status: Home / Self Care | Payer: Medicaid Other | Attending: Emergency Medicine | Admitting: Emergency Medicine

## 2014-05-09 ENCOUNTER — Telehealth (HOSPITAL_BASED_OUTPATIENT_CLINIC_OR_DEPARTMENT_OTHER): Payer: Self-pay | Admitting: Emergency Medicine

## 2014-05-09 DIAGNOSIS — Z794 Long term (current) use of insulin: Secondary | ICD-10-CM | POA: Insufficient documentation

## 2014-05-09 DIAGNOSIS — Z8614 Personal history of Methicillin resistant Staphylococcus aureus infection: Secondary | ICD-10-CM | POA: Diagnosis not present

## 2014-05-09 DIAGNOSIS — E119 Type 2 diabetes mellitus without complications: Secondary | ICD-10-CM | POA: Insufficient documentation

## 2014-05-09 DIAGNOSIS — I10 Essential (primary) hypertension: Secondary | ICD-10-CM | POA: Insufficient documentation

## 2014-05-09 DIAGNOSIS — Z872 Personal history of diseases of the skin and subcutaneous tissue: Secondary | ICD-10-CM | POA: Insufficient documentation

## 2014-05-09 DIAGNOSIS — K0889 Other specified disorders of teeth and supporting structures: Secondary | ICD-10-CM

## 2014-05-09 DIAGNOSIS — E669 Obesity, unspecified: Secondary | ICD-10-CM | POA: Diagnosis not present

## 2014-05-09 DIAGNOSIS — Z4801 Encounter for change or removal of surgical wound dressing: Secondary | ICD-10-CM | POA: Insufficient documentation

## 2014-05-09 DIAGNOSIS — Z79899 Other long term (current) drug therapy: Secondary | ICD-10-CM | POA: Insufficient documentation

## 2014-05-09 DIAGNOSIS — R51 Headache: Secondary | ICD-10-CM | POA: Diagnosis not present

## 2014-05-09 DIAGNOSIS — J449 Chronic obstructive pulmonary disease, unspecified: Secondary | ICD-10-CM | POA: Insufficient documentation

## 2014-05-09 DIAGNOSIS — R011 Cardiac murmur, unspecified: Secondary | ICD-10-CM | POA: Insufficient documentation

## 2014-05-09 DIAGNOSIS — R519 Headache, unspecified: Secondary | ICD-10-CM

## 2014-05-09 DIAGNOSIS — K088 Other specified disorders of teeth and supporting structures: Secondary | ICD-10-CM | POA: Insufficient documentation

## 2014-05-09 DIAGNOSIS — Z7951 Long term (current) use of inhaled steroids: Secondary | ICD-10-CM | POA: Diagnosis not present

## 2014-05-09 MED ORDER — OXYCODONE-ACETAMINOPHEN 5-325 MG PO TABS
1.0000 | ORAL_TABLET | Freq: Once | ORAL | Status: AC
  Administered 2014-05-09: 1 via ORAL
  Filled 2014-05-09: qty 1

## 2014-05-09 MED ORDER — KETOROLAC TROMETHAMINE 60 MG/2ML IM SOLN
60.0000 mg | Freq: Once | INTRAMUSCULAR | Status: AC
  Administered 2014-05-09: 60 mg via INTRAMUSCULAR
  Filled 2014-05-09: qty 2

## 2014-05-09 NOTE — ED Notes (Signed)
States urgent care called and stated that test from rt little finger drainage was pos for MRSA,  Were suppose to change antibiotics but had not called in new order,  States yesterday started having sharp rt facial pain and tonight started having frontal head pain and tooth pain

## 2014-05-09 NOTE — ED Provider Notes (Signed)
CSN: [id number]     Arrival date & time 05/09/14  2129 History  This chart was scribed for Shon Batonourtney F Payson Crumby, MD by Abel PrestoKara Demonbreun, ED Scribe. This patient was seen in room MH07/MH07 and the patient's care was started at 11:17 PM.    Chief Complaint  Patient presents with  . Headache     HPI HPI Comments: Sheila Gibson is a 44 y.o. female with PMHx of DM, COPD, and HTN who presents to the Emergency Department complaining of sharp stabbing facial pain and dental pain with onset yesterday. She notes pain radiates from right jaw to forehead with some tingling. Pt did not take any medications at home for pain.  [facility] in [facility] on 05/[facility]/14 with pain in right pinky finger. An I&D was done at that time. Specimen was sent off and pt was told she has MRSA. Pt was given Rx for Keflex. She states she was suppose to get an Rx for a new Abx but it was never called into pharmacy. Pt denies numbness, vision changes, fevers, and dysphagia.   Past Medical History  Diagnosis Date  . Diabetes mellitus without complication   . COPD (chronic obstructive pulmonary disease)   . Obesity   . Hypertension    Past Surgical History  Procedure Laterality Date  . Thyroidectomy    . Cesarean section     No family history on file. History  Substance Use Topics  . Smoking status: Never Smoker   . Smokeless tobacco: Not on file  . Alcohol Use: No   OB History    No data available     Review of Systems  Constitutional: Negative for fever.  HENT: Positive for dental problem. Negative for sore throat.   Respiratory: Negative for cough and chest tightness.   Cardiovascular: Negative for chest pain.  Gastrointestinal: Negative for abdominal pain.  Genitourinary: Negative for dysuria.  Skin: Negative for color change and wound.  Neurological: Positive for headaches. Negative for dizziness and weakness.  All other systems reviewed and are negative.     Allergies  Review of patient's allergies  indicates no known allergies.  Home Medications   Prior to Admission medications   Medication Sig Start Date End Date Taking? Authorizing Provider  albuterol (PROVENTIL HFA;VENTOLIN HFA) 108 (90 BASE) MCG/ACT inhaler Inhale 1-2 puffs into the lungs every 6 (six) hours as needed for wheezing or shortness of breath.    Historical Provider, MD  albuterol (PROVENTIL) (2.5 MG/3ML) 0.083% nebulizer solution Take 2.5 mg by nebulization 2 (two) times daily.    Historical Provider, MD  beclomethasone (QVAR) 80 MCG/ACT inhaler Inhale 2 puffs into the lungs 2 (two) times daily.    Historical Provider, MD  Canagliflozin (INVOKANA) 100 MG TABS Take 100 mg by mouth daily.     Historical Provider, MD  clindamycin (CLEOCIN) 150 MG capsule Take 1 capsule (150 mg total) by mouth every 6 (six) hours. 05/10/14   Shon Batonourtney F Sarrah Fiorenza, MD  ibuprofen (ADVIL,MOTRIN) 600 MG tablet Take 1 tablet (600 mg total) by mouth every 6 (six) hours as needed. 05/10/14   Shon Batonourtney F James Lafalce, MD  insulin glargine (LANTUS) 100 unit/mL SOPN Inject 30-60 Units into the skin 2 (two) times daily. 30 units in the morning and 60 units at night    Historical Provider, MD  metFORMIN (GLUCOPHAGE) 500 MG tablet Take 1 tablet (500 mg total) by mouth 2 (two) times daily with a meal. 08/21/13   Doug SouSam Jacubowitz, MD  oxybutynin (DITROPAN)  5 MG tablet Take 5 mg by mouth every 8 (eight) hours as needed for bladder spasms.     Historical Provider, MD  sitaGLIPtin (JANUVIA) 100 MG tablet Take 100 mg by mouth daily.    Historical Provider, MD  tiotropium (SPIRIVA) 18 MCG inhalation capsule Place 18 mcg into inhaler and inhale daily as needed (shortness of breath).     Historical Provider, MD   BP 134/69 mmHg  Pulse 98  Temp(Src) 99.7 F (37.6 C) (Oral)  Resp 20  Ht  (1.702 m)  Wt 318 lb (144.244 kg)  BMI 49.79 kg/m2  SpO2 100%  LMP 04/14/2014 Physical Exam  Constitutional: She is oriented to person, place, and time. She appears well-developed and  well-nourished.  Obese  HENT:  Head: Normocephalic and atraumatic.  Mouth/Throat: Oropharynx is clear and moist.  Tenderness to palpation over the right upper premolar, no obvious abscess, no facial swelling or overlying skin changes, no trismus, uvula midline  Neck: Neck supple.  Cardiovascular: Normal rate and regular rhythm.   Murmur heard. Pulmonary/Chest: Effort normal and breath sounds normal. No respiratory distress.  Abdominal: Soft. There is no tenderness.  Neurological: She is alert and oriented to person, place, and time.  Skin: Skin is warm and dry.  Right fifth digit exam essentially normal, no redness, erythema, fluctuance noted, no paronychia noted  Psychiatric: She has a normal mood and affect.  Nursing note and vitals reviewed.   ED Course  Procedures (including critical care time) DIAGNOSTIC STUDIES: Oxygen Saturation is 100% on room air, normal by my interpretation.    COORDINATION OF CARE: 11:24 PM Discussed treatment plan with patient at beside, the patient agrees with the plan and has no further questions at this time.   Labs Review Labs Reviewed - No data to display  Imaging Review No results found.   EKG Interpretation None      MDM   Final diagnoses:  Pain, dental  Acute nonintractable headache, unspecified headache type  History of paronychia of finger   Patient presents with dental pain and headache. Nontoxic on exam. Low suspicion for subarachnoid hemorrhage or meningitis. Suspect headache is secondary to dental pain. No obvious abscess. Patient given Toradol and Norco for pain. Regarding changes in antibiotics secondary to paronychia culture, I have reviewed the chart. Patient was supposed to receive clindamycin to cover for MRSA growing in wound culture. Patient's exam is essentially normal. I do not routinely prescribe antibiotics for simple paronychia is and there was no cellulitis described on prior exam. However, given concern for dental  pain and infection, will give patient a prescription for clindamycin which will cover for both. Discussed this with the patient. Patient stated understanding. Patient to take ibuprofen at home for further pain.  After history, exam, and medical workup I feel the patient has been appropriately medically screened and is safe for discharge home. Pertinent diagnoses were discussed with the patient. Patient was given return precautions.   I personally performed the services described in this documentation, which was scribed in my presence. The recorded information has been reviewed and is accurate.     Shon Baton, MD 05/10/14 [phone]

## 2014-05-09 NOTE — Telephone Encounter (Signed)
Post ED Visit - Positive Culture Follow-up: Successful Patient Follow-Up  Culture assessed and recommendations reviewed by: []  Wes Dulaney, Pharm.D., BCPS [x]  Celedonio MiyamotoJeremy Frens, Pharm.D., BCPS []  Georgina PillionElizabeth Martin, [address]Pharm.D., BCPS []  PeculiarMinh Pham, [address]Pharm.D., BCPS, AAHIVP []  Estella HuskMichelle Turner, Pharm.D., BCPS, AAHIVP []  Red ChristiansSamson Lee, Pharm.D. []  Tennis Mustassie Stewart, Pharm.D.  Positive wound culture MRSA  []  Patient discharged without antimicrobial prescription and treatment is now indicated [x]  Organism is resistant to prescribed ED discharge antimicrobial []  Patient with positive blood cultures  Changes discussed with ED provider:[facility] [place] New antibiotic prescription: Clindamycin 300mg  tid x 10 days as directed [facility] [place] [phone]  Contacted patient, 05/09/14 1032   Berle MullMiller, Willadean Guyton 05/09/2014, 10:40 AM

## 2014-05-09 NOTE — ED Notes (Signed)
C/o right side HA, facial pain and dental pain x 2 days

## 2014-05-10 ENCOUNTER — Encounter (HOSPITAL_COMMUNITY): Payer: Self-pay | Admitting: Emergency Medicine

## 2014-05-10 DIAGNOSIS — J449 Chronic obstructive pulmonary disease, unspecified: Secondary | ICD-10-CM | POA: Diagnosis not present

## 2014-05-10 DIAGNOSIS — H539 Unspecified visual disturbance: Secondary | ICD-10-CM | POA: Diagnosis not present

## 2014-05-10 DIAGNOSIS — I10 Essential (primary) hypertension: Secondary | ICD-10-CM | POA: Insufficient documentation

## 2014-05-10 DIAGNOSIS — R51 Headache: Secondary | ICD-10-CM | POA: Insufficient documentation

## 2014-05-10 DIAGNOSIS — E669 Obesity, unspecified: Secondary | ICD-10-CM | POA: Insufficient documentation

## 2014-05-10 DIAGNOSIS — Z79899 Other long term (current) drug therapy: Secondary | ICD-10-CM | POA: Diagnosis not present

## 2014-05-10 DIAGNOSIS — Z794 Long term (current) use of insulin: Secondary | ICD-10-CM | POA: Insufficient documentation

## 2014-05-10 DIAGNOSIS — Z7951 Long term (current) use of inhaled steroids: Secondary | ICD-10-CM | POA: Insufficient documentation

## 2014-05-10 DIAGNOSIS — E119 Type 2 diabetes mellitus without complications: Secondary | ICD-10-CM | POA: Insufficient documentation

## 2014-05-10 MED ORDER — CLINDAMYCIN HCL 150 MG PO CAPS: 150.0000 mg | ORAL_CAPSULE | Freq: Four times a day (QID) | ORAL | Status: DC

## 2014-05-10 MED ORDER — IBUPROFEN 600 MG PO TABS: 600.0000 mg | ORAL_TABLET | Freq: Four times a day (QID) | ORAL | Status: DC | PRN

## 2014-05-10 NOTE — Discharge Instructions (Signed)
You were seen today for dental pain and headache. No evidence of abscess on exam.  Your headache is likely secondary to dental pain. You can take ibuprofen at home and will be given clindamycin which will cover both dental infection and prior paronychia.  Dental Pain A tooth ache may be caused by cavities (tooth decay). Cavities expose the nerve of the tooth to air and hot or cold temperatures. It may come from an infection or abscess (also called a boil or furuncle) around your tooth. It is also often caused by dental caries (tooth decay). This causes the pain you are having. DIAGNOSIS  Your caregiver can diagnose this problem by exam. TREATMENT   If caused by an infection, it may be treated with medications which kill germs (antibiotics) and pain medications as prescribed by your caregiver. Take medications as directed.  Only take over-the-counter or prescription medicines for pain, discomfort, or fever as directed by your caregiver.  Whether the tooth ache today is caused by infection or dental disease, you should see your dentist as soon as possible for further care. SEEK MEDICAL CARE IF: The exam and treatment you received today has been provided on an emergency basis only. This is not a substitute for complete medical or dental care. If your problem worsens or new problems (symptoms) appear, and you are unable to meet with your dentist, call or return to this location. SEEK IMMEDIATE MEDICAL CARE IF:   You have a fever.  You develop redness and swelling of your face, jaw, or neck.  You are unable to open your mouth.  You have severe pain uncontrolled by pain medicine. MAKE SURE YOU:   Understand these instructions.  Will watch your condition.  Will get help right away if you are not doing well or get worse. Document Released: 12/23/2004 Document Revised: 03/17/2011 Document Reviewed: 08/11/2007 [facility] Patient Information 2015 [place]. This information is not intended  to replace advice given to you by your health care provider. Make sure you discuss any questions you have with your health care provider.

## 2014-05-10 NOTE — ED Notes (Addendum)
Pt sts she has had pain in right side of head into right side of face and jaw.  Onset 2 days ago.  [facility] and given Rx for antibiotic and Ibuprofen.  Pt st's pain is worse

## 2014-05-11 ENCOUNTER — Emergency Department (HOSPITAL_COMMUNITY)
Admission: EM | Admit: 2014-05-11 | Discharge: 2014-05-11 | Disposition: A | Discharge status: Home / Self Care | Payer: Medicaid Other | Attending: Emergency Medicine | Admitting: Emergency Medicine

## 2014-05-11 DIAGNOSIS — R519 Headache, unspecified: Secondary | ICD-10-CM

## 2014-05-11 DIAGNOSIS — R51 Headache: Secondary | ICD-10-CM

## 2014-05-11 MED ORDER — GABAPENTIN 300 MG PO CAPS: 300.0000 mg | ORAL_CAPSULE | Freq: Three times a day (TID) | ORAL | Status: DC

## 2014-05-11 MED ORDER — OXYCODONE-ACETAMINOPHEN 5-325 MG PO TABS
1.0000 | ORAL_TABLET | Freq: Once | ORAL | Status: AC
  Administered 2014-05-11: 1 via ORAL
  Filled 2014-05-11: qty 1

## 2014-05-11 NOTE — ED Provider Notes (Signed)
CSN: [id number]     Arrival date & time 05/10/14  2305 History  This chart was scribed for Sheila BilisKevin Devina Bezold, MD by Abel PrestoKara Demonbreun, ED Scribe. This patient was seen in room D36C/D36C and the patient's care was started at 12:35 AM.   Chief Complaint  Patient presents with  . Facial Pain    The history is provided by the patient. No language interpreter was used.   HPI Comments: Eileen StanfordValencia Birkland is a 44 y.o. female who presents to the Emergency Department complaining of intermittent facial pain with onset 3 days ago. Pt states pain is worse at night. Pt has taken ibuprofen for relief. Pt denies dental pain but She notes some visual disturbances when the pain is worse. Pt states she saw her PCP today but states there were no significant findings. [facility]. Pt denies fever, chills, nausea, and vomiting.    Past Medical History  Diagnosis Date  . Diabetes mellitus without complication   . COPD (chronic obstructive pulmonary disease)   . Obesity   . Hypertension    Past Surgical History  Procedure Laterality Date  . Thyroidectomy    . Cesarean section     No family history on file. History  Substance Use Topics  . Smoking status: Never Smoker   . Smokeless tobacco: Not on file  . Alcohol Use: No   OB History    No data available     Review of Systems  Constitutional: Negative for fever.  HENT: Positive for dental problem.        Facial pain  Gastrointestinal: Negative for nausea and vomiting.      Allergies  Review of patient's allergies indicates no known allergies.  Home Medications   Prior to Admission medications   Medication Sig Start Date End Date Taking? Authorizing Provider  albuterol (PROVENTIL HFA;VENTOLIN HFA) 108 (90 BASE) MCG/ACT inhaler Inhale 1-2 puffs into the lungs every 6 (six) hours as needed for wheezing or shortness of breath.    Historical Provider, MD  albuterol (PROVENTIL) (2.5 MG/3ML) 0.083% nebulizer solution  Take 2.5 mg by nebulization 2 (two) times daily.    Historical Provider, MD  beclomethasone (QVAR) 80 MCG/ACT inhaler Inhale 2 puffs into the lungs 2 (two) times daily.    Historical Provider, MD  Canagliflozin (INVOKANA) 100 MG TABS Take 100 mg by mouth daily.     Historical Provider, MD  clindamycin (CLEOCIN) 150 MG capsule Take 1 capsule (150 mg total) by mouth every 6 (six) hours. 05/10/14   Shon Batonourtney F Horton, MD  ibuprofen (ADVIL,MOTRIN) 600 MG tablet Take 1 tablet (600 mg total) by mouth every 6 (six) hours as needed. 05/10/14   Shon Batonourtney F Horton, MD  insulin glargine (LANTUS) 100 unit/mL SOPN Inject 30-60 Units into the skin 2 (two) times daily. 30 units in the morning and 60 units at night    Historical Provider, MD  metFORMIN (GLUCOPHAGE) 500 MG tablet Take 1 tablet (500 mg total) by mouth 2 (two) times daily with a meal. 08/21/13   Doug SouSam Jacubowitz, MD  oxybutynin (DITROPAN) 5 MG tablet Take 5 mg by mouth every 8 (eight) hours as needed for bladder spasms.     Historical Provider, MD  sitaGLIPtin (JANUVIA) 100 MG tablet Take 100 mg by mouth daily.    Historical Provider, MD  tiotropium (SPIRIVA) 18 MCG inhalation capsule Place 18 mcg into inhaler and inhale daily as needed (shortness of breath).     Historical  Provider, MD   BP 139/66 mmHg  Pulse 85  Temp(Src) 98.1 F (36.7 C) (Oral)  Resp 18  Ht 5\' 7"  (1.702 m)  Wt 315 lb (142.883 kg)  BMI 49.32 kg/m2  SpO2 98%  LMP 04/14/2014 Physical Exam  Constitutional: She is oriented to person, place, and time. She appears well-developed and well-nourished.  HENT:  Head: Normocephalic.  Right Ear: Tympanic membrane normal.  Left Ear: Tympanic membrane normal.  Mouth/Throat: Uvula is midline, oropharynx is clear and moist and mucous membranes are normal.  No erythema or swelling of the face No obvious acute dental abnormality  Eyes: EOM are normal.  Neck: Normal range of motion.  Pulmonary/Chest: Effort normal.  Abdominal: She exhibits no  distension.  Musculoskeletal: Normal range of motion.  Neurological: She is alert and oriented to person, place, and time.  Psychiatric: She has a normal mood and affect.  Nursing note and vitals reviewed.   ED Course  Procedures (including critical care time) DIAGNOSTIC STUDIES: Oxygen Saturation is 99% on room air, normal by my interpretation.    COORDINATION OF CARE: 12:44 AM Discussed treatment plan with patient at beside, the patient agrees with the plan and has no further questions at this time.   Labs Review Labs Reviewed - No data to display  Imaging Review No results found.   EKG Interpretation None      MDM   Final diagnoses:  Facial pain    Likely neuropathic related pain.  We'll place on a short course of Neurontin.  Primary care follow-up.  Doubt temporal arteritis.  Could represent developing trigeminal neuralgia.  Extremity movements are normal.  Dentition is without acute abnormality.  No facial swelling.  No facial weakness.        Sheila BilisKevin Lileigh Fahringer, MD 05/11/14 [phone]

## 2014-05-17 ENCOUNTER — Emergency Department (HOSPITAL_COMMUNITY)
Admission: EM | Admit: 2014-05-17 | Discharge: 2014-05-18 | Disposition: A | Discharge status: Home / Self Care | Payer: Medicaid Other | Attending: Emergency Medicine | Admitting: Emergency Medicine

## 2014-05-17 ENCOUNTER — Encounter (HOSPITAL_COMMUNITY): Payer: Self-pay | Admitting: *Deleted

## 2014-05-17 DIAGNOSIS — L03011 Cellulitis of right finger: Secondary | ICD-10-CM | POA: Diagnosis not present

## 2014-05-17 DIAGNOSIS — Z794 Long term (current) use of insulin: Secondary | ICD-10-CM | POA: Insufficient documentation

## 2014-05-17 DIAGNOSIS — R1013 Epigastric pain: Secondary | ICD-10-CM | POA: Diagnosis present

## 2014-05-17 DIAGNOSIS — Z3202 Encounter for pregnancy test, result negative: Secondary | ICD-10-CM | POA: Diagnosis not present

## 2014-05-17 DIAGNOSIS — E1165 Type 2 diabetes mellitus with hyperglycemia: Secondary | ICD-10-CM | POA: Insufficient documentation

## 2014-05-17 DIAGNOSIS — K8020 Calculus of gallbladder without cholecystitis without obstruction: Secondary | ICD-10-CM | POA: Diagnosis not present

## 2014-05-17 DIAGNOSIS — Z79899 Other long term (current) drug therapy: Secondary | ICD-10-CM | POA: Insufficient documentation

## 2014-05-17 DIAGNOSIS — R7401 Elevation of levels of liver transaminase levels: Secondary | ICD-10-CM

## 2014-05-17 DIAGNOSIS — Z7951 Long term (current) use of inhaled steroids: Secondary | ICD-10-CM | POA: Insufficient documentation

## 2014-05-17 DIAGNOSIS — J449 Chronic obstructive pulmonary disease, unspecified: Secondary | ICD-10-CM | POA: Diagnosis not present

## 2014-05-17 DIAGNOSIS — R739 Hyperglycemia, unspecified: Secondary | ICD-10-CM

## 2014-05-17 DIAGNOSIS — R740 Nonspecific elevation of levels of transaminase and lactic acid dehydrogenase [LDH]: Secondary | ICD-10-CM | POA: Diagnosis not present

## 2014-05-17 DIAGNOSIS — I10 Essential (primary) hypertension: Secondary | ICD-10-CM | POA: Insufficient documentation

## 2014-05-17 DIAGNOSIS — E669 Obesity, unspecified: Secondary | ICD-10-CM | POA: Insufficient documentation

## 2014-05-17 DIAGNOSIS — R109 Unspecified abdominal pain: Secondary | ICD-10-CM

## 2014-05-17 LAB — COMPREHENSIVE METABOLIC PANEL
ALT: 89 U/L — ABNORMAL HIGH (ref 14–54)
AST: 77 U/L — ABNORMAL HIGH (ref 15–41)
Albumin: 2.9 g/dL — ABNORMAL LOW (ref 3.5–5.0)
Alkaline Phosphatase: 172 U/L — ABNORMAL HIGH (ref 38–126)
Anion gap: 10 (ref 5–15)
BUN: 8 mg/dL (ref 6–20)
CO2: 23 mmol/L (ref 22–32)
Calcium: 8.7 mg/dL — ABNORMAL LOW (ref 8.9–10.3)
Chloride: 100 mmol/L — ABNORMAL LOW (ref 101–111)
Creatinine, Ser: 0.96 mg/dL (ref 0.44–1.00)
GFR calc non Af Amer: >60 mL/min (ref >60)
GLUCOSE: 450 mg/dL — AB (ref 70–99)
Potassium: 3.5 mmol/L (ref 3.5–5.1)
SODIUM: 133 mmol/L — AB (ref 135–145)
Total Bilirubin: 0.5 mg/dL (ref 0.3–1.2)
Total Protein: 8 g/dL (ref 6.5–8.1)

## 2014-05-17 LAB — CBC WITH DIFFERENTIAL/PLATELET
BASOS ABS: 0 10*3/uL (ref 0.0–0.1)
Basophils Relative: 0 % (ref 0–1)
EOS PCT: 1 % (ref 0–5)
Eosinophils Absolute: 0.1 10*3/uL (ref 0.0–0.7)
HCT: 37.7 % (ref 36.0–46.0)
Hemoglobin: 12.6 g/dL (ref 12.0–15.0)
LYMPHS PCT: 39 % (ref 12–46)
Lymphs Abs: 3.5 10*3/uL (ref 0.7–4.0)
MCH: 29.1 pg (ref 26.0–34.0)
MCHC: 33.4 g/dL (ref 30.0–36.0)
MCV: 87.1 fL (ref 78.0–100.0)
Monocytes Absolute: 0.7 10*3/uL (ref 0.1–1.0)
Monocytes Relative: 8 % (ref 3–12)
Neutro Abs: 4.5 10*3/uL (ref 1.7–7.7)
Neutrophils Relative %: 52 % (ref 43–77)
PLATELETS: 211 10*3/uL (ref 150–400)
RBC: 4.33 MIL/uL (ref 3.87–5.11)
RDW: 13.7 % (ref 11.5–15.5)
WBC: 8.9 10*3/uL (ref 4.0–10.5)

## 2014-05-17 LAB — URINALYSIS, ROUTINE W REFLEX MICROSCOPIC
Bilirubin Urine: NEGATIVE
Hgb urine dipstick: NEGATIVE
Ketones, ur: NEGATIVE mg/dL
LEUKOCYTES UA: NEGATIVE
Nitrite: NEGATIVE
PH: 5.5 (ref 5.0–8.0)
Protein, ur: NEGATIVE mg/dL
Specific Gravity, Urine: 1.042 — ABNORMAL HIGH (ref 1.005–1.030)
Urobilinogen, UA: 1 mg/dL (ref 0.0–1.0)

## 2014-05-17 LAB — URINE MICROSCOPIC-ADD ON

## 2014-05-17 LAB — POC URINE PREG, ED: Preg Test, Ur: NEGATIVE

## 2014-05-17 LAB — LIPASE, BLOOD: LIPASE: 24 U/L (ref 22–51)

## 2014-05-17 MED ORDER — HYDROMORPHONE HCL 1 MG/ML IJ SOLN
1.0000 mg | Freq: Once | INTRAMUSCULAR | Status: AC
  Administered 2014-05-18: 1 mg via INTRAVENOUS
  Filled 2014-05-17: qty 1

## 2014-05-17 MED ORDER — ONDANSETRON HCL 4 MG/2ML IJ SOLN
4.0000 mg | Freq: Once | INTRAMUSCULAR | Status: AC
  Administered 2014-05-18: 4 mg via INTRAVENOUS
  Filled 2014-05-17: qty 2

## 2014-05-17 MED ORDER — LIDOCAINE HCL 2 % IJ SOLN
10.0000 mL | Freq: Once | INTRAMUSCULAR | Status: AC
  Administered 2014-05-18: 200 mg
  Filled 2014-05-17: qty 20

## 2014-05-17 NOTE — ED Provider Notes (Signed)
CSN: [id number]     Arrival date & time 05/17/14  2226 History  This chart was scribed for Dione Booze, MD by Abel Presto, ED Scribe. This patient was seen in room A08C/A08C and the patient's care was started at 11:53 PM.     Chief Complaint  Patient presents with  . Abdominal Pain    Patient is a 44 y.o. female presenting with abdominal pain. The history is provided by the patient. No language interpreter was used.  Abdominal Pain Associated symptoms: diarrhea and nausea   Associated symptoms: no chills, no fever and no vomiting    HPI Comments: Sheila Gibson is a 44 y.o. female with PMHx of DM, COPD, HTN who presents to the Emergency Department complaining of 10/10 epigastric pain that radiates to periumbilical with onset 3 days ago. Pt reports h/o gallstones and . She states her PCP, Dr. Lacie Scotts, is waiting for pt to lose some weight before removing her gallbladder. Pt notes associated nausea and diarrhea but denies vomiting. She denies any aggravating or alleviating factors. She has taken ibuprofen for relief. Pt also reports worsening swelling to right pinky finger with onset last several days. Pt was seen in ED on 05/03/14 after she attempted to drain paronychia to her right pinky nail. An I&D was done at that time, with specimen taken. 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 She has one more dose to take.  Pt denies fever, chills, and diaphoresis.   Past Medical History  Diagnosis Date  . Diabetes mellitus without complication   . COPD (chronic obstructive pulmonary disease)   . Obesity   . Hypertension    Past Surgical History  Procedure Laterality Date  . Thyroidectomy    . Cesarean section     No family history on file. History  Substance Use Topics  . Smoking status: Never Smoker   . Smokeless tobacco: Not on file  . Alcohol Use: No   OB History    No data available     Review of Systems  Constitutional: Negative for fever, chills  and diaphoresis.  Gastrointestinal: Positive for nausea, abdominal pain and diarrhea. Negative for vomiting.  All other systems reviewed and are negative.     Allergies  Review of patient's allergies indicates no known allergies.  Home Medications   Prior to Admission medications   Medication Sig Start Date End Date Taking? Authorizing Provider  albuterol (PROVENTIL HFA;VENTOLIN HFA) 108 (90 BASE) MCG/ACT inhaler Inhale 1-2 puffs into the lungs every 6 (six) hours as needed for wheezing or shortness of breath.   Yes Historical Provider, MD  albuterol (PROVENTIL) (2.5 MG/3ML) 0.083% nebulizer solution Take 2.5 mg by nebulization 2 (two) times daily.   Yes Historical Provider, MD  beclomethasone (QVAR) 80 MCG/ACT inhaler Inhale 2 puffs into the lungs 2 (two) times daily.   Yes Historical Provider, MD  Canagliflozin (INVOKANA) 100 MG TABS Take 100 mg by mouth daily.    Yes Historical Provider, MD  clindamycin (CLEOCIN) 150 MG capsule Take 1 capsule (150 mg total) by mouth every 6 (six) hours. 05/10/14  Yes Shon Baton, MD  ibuprofen (ADVIL,MOTRIN) 600 MG tablet Take 1 tablet (600 mg total) by mouth every 6 (six) hours as needed. Patient taking differently: Take 600 mg by mouth every 6 (six) hours as needed for moderate pain.  05/10/14  Yes Shon Baton, MD  insulin glargine (LANTUS) 100 unit/mL SOPN Inject 59 Units into the skin 2 (two) times daily. 30  units in the morning and 60 units at night   Yes Historical Provider, MD  metFORMIN (GLUCOPHAGE) 500 MG tablet Take 1 tablet (500 mg total) by mouth 2 (two) times daily with a meal. 08/21/13  Yes Doug Sou, MD  oxybutynin (DITROPAN) 5 MG tablet Take 5 mg by mouth every 8 (eight) hours as needed for bladder spasms.    Yes Historical Provider, MD  sitaGLIPtin (JANUVIA) 100 MG tablet Take 100 mg by mouth daily.   Yes Historical Provider, MD  tiotropium (SPIRIVA) 18 MCG inhalation capsule Place 18 mcg into inhaler and inhale daily as  needed (shortness of breath).    Yes Historical Provider, MD  gabapentin (NEURONTIN) 300 MG capsule Take 1 capsule (300 mg total) by mouth 3 (three) times daily. Patient not taking: Reported on 05/17/2014 05/11/14   Azalia Bilis, MD   BP 141/85 mmHg  Pulse 93  Temp(Src) 98.6 F (37 C) (Oral)  Resp 20  Wt 315 lb (142.883 kg)  SpO2 100%  LMP 04/14/2014 Physical Exam  Constitutional: She is oriented to person, place, and time. She appears well-developed and well-nourished.  HENT:  Head: Normocephalic.  Eyes: Conjunctivae are normal. Pupils are equal, round, and reactive to light. No scleral icterus.  Neck: Normal range of motion. Neck supple. No JVD present.  Cardiovascular: Normal rate, regular rhythm and normal heart sounds.   No murmur heard. Pulmonary/Chest: Effort normal and breath sounds normal. She has no wheezes. She has no rales. She exhibits no tenderness.  Abdominal: She exhibits no distension and no mass. Bowel sounds are decreased. There is tenderness (moderate) in the right upper quadrant and epigastric area. There is no rebound and no guarding.  Musculoskeletal: Normal range of motion. She exhibits no edema.  early paronychia right 5th finger  Lymphadenopathy:    She has no cervical adenopathy.  Neurological: She is alert and oriented to person, place, and time. No cranial nerve deficit. She exhibits normal muscle tone. Coordination normal.  Skin: Skin is warm and dry. No rash noted.  Psychiatric: She has a normal mood and affect. Her behavior is normal. Judgment and thought content normal.  Nursing note and vitals reviewed.   ED Course  Procedures (including critical care time) DIAGNOSTIC STUDIES: Oxygen Saturation is 100% on room air, normal by my interpretation.    COORDINATION OF CARE: 12:04 AM Discussed treatment plan with patient at beside, the patient agrees with the plan and has no further questions at this time.   Labs Review Results for orders placed or  performed during the hospital encounter of 05/17/14  CBC with Differential  Result Value Ref Range   WBC 8.9 4.0 - 10.5 K/uL   RBC 4.33 3.87 - 5.11 MIL/uL   Hemoglobin 12.6 12.0 - 15.0 g/dL   HCT 96.0 45.4 - 09.8 %   MCV 87.1 78.0 - 100.0 fL   MCH 29.1 26.0 - 34.0 pg   MCHC 33.4 30.0 - 36.0 g/dL   RDW 11.9 14.7 - 82.9 %   Platelets 211 150 - 400 K/uL   Neutrophils Relative % 52 43 - 77 %   Neutro Abs 4.5 1.7 - 7.7 K/uL   Lymphocytes Relative 39 12 - 46 %   Lymphs Abs 3.5 0.7 - 4.0 K/uL   Monocytes Relative 8 3 - 12 %   Monocytes Absolute 0.7 0.1 - 1.0 K/uL   Eosinophils Relative 1 0 - 5 %   Eosinophils Absolute 0.1 0.0 - 0.7 K/uL   Basophils Relative  0 0 - 1 %   Basophils Absolute 0.0 0.0 - 0.1 K/uL  Comprehensive metabolic panel  Result Value Ref Range   Sodium 133 (L) 135 - 145 mmol/L   Potassium 3.5 3.5 - 5.1 mmol/L   Chloride 100 (L) 101 - 111 mmol/L   CO2 23 22 - 32 mmol/L   Glucose, Bld 450 (H) 70 - 99 mg/dL   BUN 8 6 - 20 mg/dL   Creatinine, Ser 2.130.96 0.44 - 1.00 mg/dL   Calcium 8.7 (L) 8.9 - 10.3 mg/dL   Total Protein 8.0 6.5 - 8.1 g/dL   Albumin 2.9 (L) 3.5 - 5.0 g/dL   AST 77 (H) 15 - 41 U/L   ALT 89 (H) 14 - 54 U/L   Alkaline Phosphatase 172 (H) 38 - 126 U/L   Total Bilirubin 0.5 0.3 - 1.2 mg/dL   GFR calc non Af Amer >60 >60 mL/min   GFR calc Af Amer >60 >60 mL/min   Anion gap 10 5 - 15  Lipase, blood  Result Value Ref Range   Lipase 24 22 - 51 U/L  Urinalysis, Routine w reflex microscopic  Result Value Ref Range   Color, Urine YELLOW YELLOW   APPearance CLEAR CLEAR   Specific Gravity, Urine 1.042 (H) 1.005 - 1.030   pH 5.5 5.0 - 8.0   Glucose, UA >1000 (A) NEGATIVE mg/dL   Hgb urine dipstick NEGATIVE NEGATIVE   Bilirubin Urine NEGATIVE NEGATIVE   Ketones, ur NEGATIVE NEGATIVE mg/dL   Protein, ur NEGATIVE NEGATIVE mg/dL   Urobilinogen, UA 1.0 0.0 - 1.0 mg/dL   Nitrite NEGATIVE NEGATIVE   Leukocytes, UA NEGATIVE NEGATIVE  Urine microscopic-add on   Result Value Ref Range   Squamous Epithelial / LPF RARE RARE   WBC, UA 0-2 <3 WBC/hpf   RBC / HPF 0-2 <3 RBC/hpf   Bacteria, UA RARE RARE  POC Urine Pregnancy, ED  (If Pre-menopausal female)  [facility]  Result Value Ref Range   Preg Test, Ur NEGATIVE NEGATIVE   Imaging Review [place] Abdomen Complete  05/18/2014   CLINICAL DATA:  Abdominal pain.  EXAM: ULTRASOUND ABDOMEN COMPLETE  COMPARISON:  02/11/2014  FINDINGS: Gallbladder: Cholelithiasis with multiple stones and sludge in the gallbladder. Largest stone measures about 1.1 cm diameter. No gallbladder wall thickening. Murphy's sign is negative.  Common bile duct: Diameter: 4 mm, normal  Liver: Limited visualization due to body habitus. No focal lesions identified in the visualized portions.  IVC: No abnormality visualized.  Pancreas: Visualized portion unremarkable.  Spleen: Size and appearance within normal limits.  Right Kidney: Length: 14.9 cm. Echogenicity within normal limits. No mass or hydronephrosis visualized.  Left Kidney: Length: 14.7 cm. Echogenicity within normal limits. No mass or hydronephrosis visualized.  Abdominal aorta: No aneurysm visualized.  Other findings: Examination was technically limited due to body habitus and bowel gas.  IMPRESSION: Cholelithiasis with stones and sludge in the gallbladder. No wall thickening or infiltration. Murphy's sign was negative. Kidneys appear enlarged without focal lesion or hydronephrosis. No significant change since prior study.   Electronically Signed   By: Burman NievesWilliam  Stevens M.D.   On: 05/18/2014 03:13     INCISION AND DRAINAGE PROCEDURE NOTE: Patient identification was confirmed and verbal consent was obtained. This procedure was performed by Dione Boozeavid Zainah Steven, MD at 3:14 AM. . Site: Right fifth finger Sterile procedures observed Anesthetic used (type and amt): Lidocaine 2% without epinephrine Paronychia was opened with blunt dissection with scissors Drainage: Moderate, per minute  Complexity:  Complex - abscess probed to break up loculations Packing used: none Site anesthetized with digital block, incision made over site, wound drained and explored loculations, rinsed with copious amounts of normal saline,  covered with dry, sterile dressing.  Pt tolerated procedure well without complications.  Instructions for care discussed verbally.   MDM   Final diagnoses:  Abdominal pain, unspecified abdominal location  Calculus of gallbladder without cholecystitis without obstruction  Elevated transaminase level  Hyperglycemia  Paronychia of fifth finger of right hand    Abdominal pain consistent with biliary colic. Paronychia right fifth finger. Parent may care is recurrent. Old records are reviewed and she had been seen about 2 weeks ago with incision and drainage of paronychia. There is a moderate amount of pus present when it was open. Abdominal pain was controlled with hydromorphone and ondansetron. She was sent for ultrasound to look for evidence of cholecystitis but none was present. She has elevation of her transaminases which is slightly worse than previously. At this point, I do not see any findings of acute cholecystitis. She did get good pain relief with above noted medication. It was elected to send her to general surgery office as an outpatient and she is given prescription for oxycodone-acetaminophen and ondansetron. She's to return if she develops fever, pain is not being adequately controlled, or she starts vomiting. She is advised to stay on a low fat diet. Paronychia has been adequately treated with incision and drainage and does not require antibiotics.   I personally performed the services described in this documentation, which was scribed in my presence. The recorded information has been reviewed and is accurate.       Dione Boozeavid Demarcus Thielke, MD 05/18/14 [phone]

## 2014-05-17 NOTE — ED Notes (Signed)
The pt is c/o mid abd pain foir one week. She has known gallstones but her doctor is waiting for her to loose weight.  She has had the pain this time one week.  She also wants to be seen for the infection in her rt little fingernail.  She has been here before for this.  No nv or diarrhea.  lmp march

## 2014-05-18 ENCOUNTER — Emergency Department (HOSPITAL_COMMUNITY): Payer: Medicaid Other

## 2014-05-18 MED ORDER — ONDANSETRON HCL 4 MG PO TABS: 4.0000 mg | ORAL_TABLET | Freq: Four times a day (QID) | ORAL | Status: DC | PRN

## 2014-05-18 MED ORDER — HYDROMORPHONE HCL 1 MG/ML IJ SOLN
1.0000 mg | Freq: Once | INTRAMUSCULAR | Status: AC
  Administered 2014-05-18: 1 mg via INTRAVENOUS
  Filled 2014-05-18: qty 1

## 2014-05-18 MED ORDER — OXYCODONE-ACETAMINOPHEN 5-325 MG PO TABS: 1.0000 | ORAL_TABLET | ORAL | Status: DC | PRN

## 2014-05-18 NOTE — Discharge Instructions (Signed)
Return if pain is not being adequately controlled, new start running a fever, or start vomiting. Try to avoid high-fat foods since that may make gallbladder problems worse.  Cholelithiasis Cholelithiasis (also called gallstones) is a form of gallbladder disease in which gallstones form in your gallbladder. The gallbladder is an organ that stores bile made in the liver, which helps digest fats. Gallstones begin as small crystals and slowly grow into stones. Gallstone pain occurs when the gallbladder spasms and a gallstone is blocking the duct. Pain can also occur when a stone passes out of the duct.  RISK FACTORS  Being female.   Having multiple pregnancies. Health care providers sometimes advise removing diseased gallbladders before future pregnancies.   Being obese.  Eating a diet heavy in fried foods and fat.   Being older than 60 years and increasing age.   Prolonged use of medicines containing female hormones.   Having diabetes mellitus.   Rapidly losing weight.   Having a family history of gallstones (heredity).  SYMPTOMS  Nausea.   Vomiting.  Abdominal pain.   Yellowing of the skin (jaundice).   Sudden pain. It may persist from several minutes to several hours.  Fever.   Tenderness to the touch. In some cases, when gallstones do not move into the bile duct, people have no pain or symptoms. These are called "silent" gallstones.  TREATMENT Silent gallstones do not need treatment. In severe cases, emergency surgery may be required. Options for treatment include:  Surgery to remove the gallbladder. This is the most common treatment.  Medicines. These do not always work and may take 6-12 months or more to work.  Shock wave treatment (extracorporeal biliary lithotripsy). In this treatment an ultrasound machine sends shock waves to the gallbladder to break gallstones into smaller pieces that can pass into the intestines or be dissolved by medicine. HOME CARE  INSTRUCTIONS   Only take over-the-counter or prescription medicines for pain, discomfort, or fever as directed by your health care provider.   Follow a low-fat diet until seen again by your health care provider. Fat causes the gallbladder to contract, which can result in pain.   Follow up with your health care provider as directed. Attacks are almost always recurrent and surgery is usually required for permanent treatment.  SEEK IMMEDIATE MEDICAL CARE IF:   Your pain increases and is not controlled by medicines.   You have a fever or persistent symptoms for more than 2-3 days.   You have a fever and your symptoms suddenly get worse.   You have persistent nausea and vomiting.  MAKE SURE YOU:   Understand these instructions.  Will watch your condition.  Will get help right away if you are not doing well or get worse. Document Released: 12/19/2004 Document Revised: 08/25/2012 Document Reviewed: 06/16/2012 [facility] Patient Information 2015 [place], [place]. This information is not intended to replace advice given to you by your health care provider. Make sure you discuss any questions you have with your health care provider.  Paronychia Paronychia is an inflammatory reaction involving the folds of the skin surrounding the fingernail. This is commonly caused by an infection in the skin around a nail. The most common cause of paronychia is frequent wetting of the hands (as seen with bartenders, food servers, nurses or others who wet their hands). This makes the skin around the fingernail susceptible to infection by bacteria (germs) or fungus. Other predisposing factors are:  Aggressive manicuring.  Nail biting.  Thumb sucking. The most common  cause is a staphylococcal (a type of germ) infection, or a fungal (Candida) infection. When caused by a germ, it usually comes on suddenly with redness, swelling, pus and is often painful. It may get under the nail and form an abscess  (collection of pus), or form an abscess around the nail. If the nail itself is infected with a fungus, the treatment is usually prolonged and may require oral medicine for up to one year. Your caregiver will determine the length of time treatment is required. The paronychia caused by bacteria (germs) may largely be avoided by not pulling on hangnails or picking at cuticles. When the infection occurs at the tips of the finger it is called felon. When the cause of paronychia is from the herpes simplex virus (HSV) it is called herpetic whitlow. TREATMENT  When an abscess is present treatment is often incision and drainage. This means that the abscess must be cut open so the pus can get out. When this is done, the following home care instructions should be followed. HOME CARE INSTRUCTIONS   It is important to keep the affected fingers very dry. Rubber or plastic gloves over cotton gloves should be used whenever the hand must be placed in water.  Keep wound clean, dry and dressed as suggested by your caregiver between warm soaks or warm compresses.  Soak in warm water for fifteen to twenty minutes three to four times per day for bacterial infections. Fungal infections are very difficult to treat, so often require treatment for long periods of time.  For bacterial (germ) infections take antibiotics (medicine which kill germs) as directed and finish the prescription, even if the problem appears to be solved before the medicine is gone.  Only take over-the-counter or prescription medicines for pain, discomfort, or fever as directed by your caregiver. SEEK IMMEDIATE MEDICAL CARE IF:  You have redness, swelling, or increasing pain in the wound.  You notice pus coming from the wound.  You have a fever.  You notice a bad smell coming from the wound or dressing. Document Released: 06/18/2000 Document Revised: 03/17/2011 Document Reviewed: 02/18/2008 [facility] Patient Information 2015 [place]. This  information is not intended to replace advice given to you by your health care provider. Make sure you discuss any questions you have with your health care provider.   Acetaminophen; Oxycodone tablets What is this medicine? ACETAMINOPHEN; OXYCODONE (a set a MEE noe fen; ox i KOE done) is a pain reliever. It is used to treat mild to moderate pain. This medicine may be used for other purposes; ask your health care provider or pharmacist if you have questions. COMMON BRAND NAME(S): Endocet, Magnacet, Narvox, Percocet, Perloxx, Primalev, Primlev, Roxicet, Xolox What should I tell my health care provider before I take this medicine? They need to know if you have any of these conditions: -brain tumor -Crohn's disease, inflammatory bowel disease, or ulcerative colitis -drug abuse or addiction -head injury -heart or circulation problems -if you often drink alcohol -kidney disease or problems going to the bathroom -liver disease -lung disease, asthma, or breathing problems -an unusual or allergic reaction to acetaminophen, oxycodone, other opioid analgesics, other medicines, foods, dyes, or preservatives -pregnant or trying to get pregnant -breast-feeding How should I use this medicine? Take this medicine by mouth with a full glass of water. Follow the directions on the prescription label. Take your medicine at regular intervals. Do not take your medicine more often than directed. Talk to your pediatrician regarding the use of this medicine  in children. Special care may be needed. Patients over 35 years old may have a stronger reaction and need a smaller dose. Overdosage: If you think you have taken too much of this medicine contact a poison control center or emergency room at once. NOTE: This medicine is only for you. Do not share this medicine with others. What if I miss a dose? If you miss a dose, take it as soon as you can. If it is almost time for your next dose, take only that dose. Do not  take double or extra doses. What may interact with this medicine? -alcohol -antihistamines -barbiturates like amobarbital, butalbital, butabarbital, methohexital, pentobarbital, phenobarbital, thiopental, and secobarbital -benztropine -drugs for bladder problems like solifenacin, trospium, oxybutynin, tolterodine, hyoscyamine, and methscopolamine -drugs for breathing problems like ipratropium and tiotropium -drugs for certain stomach or intestine problems like propantheline, homatropine methylbromide, glycopyrrolate, atropine, belladonna, and dicyclomine -general anesthetics like etomidate, ketamine, nitrous oxide, propofol, desflurane, enflurane, halothane, isoflurane, and sevoflurane -medicines for depression, anxiety, or psychotic disturbances -medicines for sleep -muscle relaxants -naltrexone -narcotic medicines (opiates) for pain -phenothiazines like perphenazine, thioridazine, chlorpromazine, mesoridazine, fluphenazine, prochlorperazine, promazine, and trifluoperazine -scopolamine -tramadol -trihexyphenidyl This list may not describe all possible interactions. Give your health care provider a list of all the medicines, herbs, non-prescription drugs, or dietary supplements you use. Also tell them if you smoke, drink alcohol, or use illegal drugs. Some items may interact with your medicine. What should I watch for while using this medicine? Tell your doctor or health care professional if your pain does not go away, if it gets worse, or if you have new or a different type of pain. You may develop tolerance to the medicine. Tolerance means that you will need a higher dose of the medication for pain relief. Tolerance is normal and is expected if you take this medicine for a long time. Do not suddenly stop taking your medicine because you may develop a severe reaction. Your body becomes used to the medicine. This does NOT mean you are addicted. Addiction is a behavior related to getting and  using a drug for a non-medical reason. If you have pain, you have a medical reason to take pain medicine. Your doctor will tell you how much medicine to take. If your doctor wants you to stop the medicine, the dose will be slowly lowered over time to avoid any side effects. You may get drowsy or dizzy. Do not drive, use machinery, or do anything that needs mental alertness until you know how this medicine affects you. Do not stand or sit up quickly, especially if you are an older patient. This reduces the risk of dizzy or fainting spells. Alcohol may interfere with the effect of this medicine. Avoid alcoholic drinks. There are different types of narcotic medicines (opiates) for pain. If you take more than one type at the same time, you may have more side effects. Give your health care provider a list of all medicines you use. Your doctor will tell you how much medicine to take. Do not take more medicine than directed. Call emergency for help if you have problems breathing. The medicine will cause constipation. Try to have a bowel movement at least every 2 to 3 days. If you do not have a bowel movement for 3 days, call your doctor or health care professional. Do not take Tylenol (acetaminophen) or medicines that have acetaminophen with this medicine. Too much acetaminophen can be very dangerous. Many nonprescription medicines contain acetaminophen. Always read the labels  carefully to avoid taking more acetaminophen. What side effects may I notice from receiving this medicine? Side effects that you should report to your doctor or health care professional as soon as possible: -allergic reactions like skin rash, itching or hives, swelling of the face, lips, or tongue -breathing difficulties, wheezing -confusion -light headedness or fainting spells -severe stomach pain -unusually weak or tired -yellowing of the skin or the whites of the eyes Side effects that usually do not require medical attention (report  to your doctor or health care professional if they continue or are bothersome): -dizziness -drowsiness -nausea -vomiting This list may not describe all possible side effects. Call your doctor for medical advice about side effects. You may report side effects to FDA at 1-800-FDA-1088. Where should I keep my medicine? Keep out of the reach of children. This medicine can be abused. Keep your medicine in a safe place to protect it from theft. Do not share this medicine with anyone. Selling or giving away this medicine is dangerous and against the law. Store at room temperature between 20 and 25 degrees C (68 and 77 degrees F). Keep container tightly closed. Protect from light. This medicine may cause accidental overdose and death if it is taken by other adults, children, or pets. Flush any unused medicine down the toilet to reduce the chance of harm. Do not use the medicine after the expiration date. NOTE: This sheet is a summary. It may not cover all possible information. If you have questions about this medicine, talk to your doctor, pharmacist, or health care provider.  2015, [employer]vier/Gold Standard. (2012-08-16 13:17:35)  Ondansetron tablets What is this medicine? ONDANSETRON (on DAN se tron) is used to treat nausea and vomiting caused by chemotherapy. It is also used to prevent or treat nausea and vomiting after surgery. This medicine may be used for other purposes; ask your health care provider or pharmacist if you have questions. COMMON BRAND NAME(S): Zofran What should I tell my health care provider before I take this medicine? They need to know if you have any of these conditions: -heart disease -history of irregular heartbeat -liver disease -low levels of magnesium or potassium in the blood -an unusual or allergic reaction to ondansetron, granisetron, other medicines, foods, dyes, or preservatives -pregnant or trying to get pregnant -breast-feeding How should I use this  medicine? Take this medicine by mouth with a glass of water. Follow the directions on your prescription label. Take your doses at regular intervals. Do not take your medicine more often than directed. Talk to your pediatrician regarding the use of this medicine in children. Special care may be needed. Overdosage: If you think you have taken too much of this medicine contact a poison control center or emergency room at once. NOTE: This medicine is only for you. Do not share this medicine with others. What if I miss a dose? If you miss a dose, take it as soon as you can. If it is almost time for your next dose, take only that dose. Do not take double or extra doses. What may interact with this medicine? Do not take this medicine with any of the following medications: -apomorphine -certain medicines for fungal infections like fluconazole, itraconazole, ketoconazole, posaconazole, voriconazole -cisapride -dofetilide -dronedarone -pimozide -thioridazine -ziprasidone This medicine may also interact with the following medications: -carbamazepine -certain medicines for depression, anxiety, or psychotic disturbances -fentanyl -linezolid -MAOIs like Carbex, Eldepryl, Marplan, Nardil, and Parnate -methylene blue (injected into a vein) -other medicines that prolong the QT  interval (cause an abnormal heart rhythm) -phenytoin -rifampicin -tramadol This list may not describe all possible interactions. Give your health care provider a list of all the medicines, herbs, non-prescription drugs, or dietary supplements you use. Also tell them if you smoke, drink alcohol, or use illegal drugs. Some items may interact with your medicine. What should I watch for while using this medicine? Check with your doctor or health care professional right away if you have any sign of an allergic reaction. What side effects may I notice from receiving this medicine? Side effects that you should report to your doctor or  health care professional as soon as possible: -allergic reactions like skin rash, itching or hives, swelling of the face, lips or tongue -breathing problems -confusion -dizziness -fast or irregular heartbeat -feeling faint or lightheaded, falls -fever and chills -loss of balance or coordination -seizures -sweating -swelling of the hands or feet -tightness in the chest -tremors -unusually weak or tired Side effects that usually do not require medical attention (report to your doctor or health care professional if they continue or are bothersome): -constipation or diarrhea -headache This list may not describe all possible side effects. Call your doctor for medical advice about side effects. You may report side effects to FDA at 1-800-FDA-1088. Where should I keep my medicine? Keep out of the reach of children. Store between 2 and 30 degrees C (36 and 86 degrees F). Throw away any unused medicine after the expiration date. NOTE: This sheet is a summary. It may not cover all possible information. If you have questions about this medicine, talk to your doctor, pharmacist, or health care provider.  2015, Elsevier/Gold Standard. (2012-09-29 16:27:45)

## 2014-05-18 NOTE — ED Notes (Signed)
Patient transported to Ultrasound 

## 2014-05-25 ENCOUNTER — Encounter (HOSPITAL_BASED_OUTPATIENT_CLINIC_OR_DEPARTMENT_OTHER): Payer: Self-pay | Admitting: *Deleted

## 2014-05-25 ENCOUNTER — Emergency Department (HOSPITAL_BASED_OUTPATIENT_CLINIC_OR_DEPARTMENT_OTHER)
Admission: EM | Admit: 2014-05-25 | Discharge: 2014-05-25 | Disposition: A | Discharge status: Home / Self Care | Payer: Medicaid Other | Attending: Emergency Medicine | Admitting: Emergency Medicine

## 2014-05-25 DIAGNOSIS — Z7951 Long term (current) use of inhaled steroids: Secondary | ICD-10-CM | POA: Insufficient documentation

## 2014-05-25 DIAGNOSIS — I10 Essential (primary) hypertension: Secondary | ICD-10-CM | POA: Insufficient documentation

## 2014-05-25 DIAGNOSIS — R739 Hyperglycemia, unspecified: Secondary | ICD-10-CM

## 2014-05-25 DIAGNOSIS — J45909 Unspecified asthma, uncomplicated: Secondary | ICD-10-CM | POA: Diagnosis not present

## 2014-05-25 DIAGNOSIS — E1165 Type 2 diabetes mellitus with hyperglycemia: Secondary | ICD-10-CM | POA: Insufficient documentation

## 2014-05-25 DIAGNOSIS — Z3202 Encounter for pregnancy test, result negative: Secondary | ICD-10-CM | POA: Diagnosis not present

## 2014-05-25 DIAGNOSIS — E669 Obesity, unspecified: Secondary | ICD-10-CM | POA: Insufficient documentation

## 2014-05-25 DIAGNOSIS — Z79899 Other long term (current) drug therapy: Secondary | ICD-10-CM | POA: Insufficient documentation

## 2014-05-25 DIAGNOSIS — Z794 Long term (current) use of insulin: Secondary | ICD-10-CM | POA: Diagnosis not present

## 2014-05-25 DIAGNOSIS — K8020 Calculus of gallbladder without cholecystitis without obstruction: Secondary | ICD-10-CM

## 2014-05-25 LAB — CBC WITH DIFFERENTIAL/PLATELET
Basophils Absolute: 0 10*3/uL (ref 0.0–0.1)
Basophils Relative: 0 % (ref 0–1)
EOS PCT: 1 % (ref 0–5)
Eosinophils Absolute: 0.1 10*3/uL (ref 0.0–0.7)
HCT: 38.8 % (ref 36.0–46.0)
Hemoglobin: 12.9 g/dL (ref 12.0–15.0)
Lymphocytes Relative: 37 % (ref 12–46)
Lymphs Abs: 3.5 10*3/uL (ref 0.7–4.0)
MCH: 29.3 pg (ref 26.0–34.0)
MCHC: 33.2 g/dL (ref 30.0–36.0)
MCV: 88.2 fL (ref 78.0–100.0)
Monocytes Absolute: 0.8 10*3/uL (ref 0.1–1.0)
Monocytes Relative: 9 % (ref 3–12)
NEUTROS ABS: 5 10*3/uL (ref 1.7–7.7)
Neutrophils Relative %: 53 % (ref 43–77)
Platelets: 218 10*3/uL (ref 150–400)
RBC: 4.4 MIL/uL (ref 3.87–5.11)
RDW: 13.3 % (ref 11.5–15.5)
WBC: 9.4 10*3/uL (ref 4.0–10.5)

## 2014-05-25 LAB — URINALYSIS, ROUTINE W REFLEX MICROSCOPIC
Glucose, UA: >1000 mg/dL — AB
Hgb urine dipstick: NEGATIVE
KETONES UR: NEGATIVE mg/dL
Leukocytes, UA: NEGATIVE
NITRITE: NEGATIVE
PROTEIN: NEGATIVE mg/dL
Specific Gravity, Urine: >1.046 — ABNORMAL HIGH (ref 1.005–1.030)
UROBILINOGEN UA: 1 mg/dL (ref 0.0–1.0)
pH: 5.5 (ref 5.0–8.0)

## 2014-05-25 LAB — URINE MICROSCOPIC-ADD ON

## 2014-05-25 LAB — CBG MONITORING, ED
GLUCOSE-CAPILLARY: 333 mg/dL — AB (ref 65–99)
Glucose-Capillary: 221 mg/dL — ABNORMAL HIGH (ref 65–99)

## 2014-05-25 LAB — COMPREHENSIVE METABOLIC PANEL
ALK PHOS: 209 U/L — AB (ref 38–126)
ALT: 96 U/L — ABNORMAL HIGH (ref 14–54)
AST: 69 U/L — ABNORMAL HIGH (ref 15–41)
Albumin: 3.1 g/dL — ABNORMAL LOW (ref 3.5–5.0)
Anion gap: 6 (ref 5–15)
BUN: 11 mg/dL (ref 6–20)
CHLORIDE: 98 mmol/L — AB (ref 101–111)
CO2: 29 mmol/L (ref 22–32)
Calcium: 9.1 mg/dL (ref 8.9–10.3)
Creatinine, Ser: 0.73 mg/dL (ref 0.44–1.00)
GFR calc non Af Amer: >60 mL/min (ref >60)
GLUCOSE: 345 mg/dL — AB (ref 65–99)
POTASSIUM: 4.2 mmol/L (ref 3.5–5.1)
Sodium: 133 mmol/L — ABNORMAL LOW (ref 135–145)
Total Bilirubin: 0.4 mg/dL (ref 0.3–1.2)
Total Protein: 7.9 g/dL (ref 6.5–8.1)

## 2014-05-25 LAB — PREGNANCY, URINE: PREG TEST UR: NEGATIVE

## 2014-05-25 LAB — LIPASE, BLOOD: LIPASE: 39 U/L (ref 22–51)

## 2014-05-25 MED ORDER — HYDROCODONE-ACETAMINOPHEN 5-325 MG PO TABS: 1.0000 | ORAL_TABLET | Freq: Four times a day (QID) | ORAL | Status: DC | PRN

## 2014-05-25 MED ORDER — MORPHINE SULFATE 4 MG/ML IJ SOLN
4.0000 mg | Freq: Once | INTRAMUSCULAR | Status: AC
Start: 2014-05-25 — End: 2014-05-25
  Administered 2014-05-25: 4 mg via INTRAVENOUS
  Filled 2014-05-25: qty 1

## 2014-05-25 MED ORDER — INSULIN REGULAR HUMAN 100 UNIT/ML IJ SOLN
10.0000 [IU] | Freq: Once | INTRAMUSCULAR | Status: AC
Start: 2014-05-25 — End: 2014-05-25
  Administered 2014-05-25: 10 [IU] via INTRAVENOUS
  Filled 2014-05-25: qty 1

## 2014-05-25 MED ORDER — SODIUM CHLORIDE 0.9 % IV BOLUS (SEPSIS)
1000.0000 mL | Freq: Once | INTRAVENOUS | Status: AC
  Administered 2014-05-25: 1000 mL via INTRAVENOUS

## 2014-05-25 MED ORDER — ONDANSETRON HCL 4 MG/2ML IJ SOLN
4.0000 mg | Freq: Once | INTRAMUSCULAR | Status: AC
  Administered 2014-05-25: 4 mg via INTRAVENOUS
  Filled 2014-05-25: qty 2

## 2014-05-25 NOTE — ED Provider Notes (Signed)
CSN: [id number]     Arrival date & time 05/25/14  1152 History   First MD Initiated Contact with Patient 05/25/14 1157     Chief Complaint  Patient presents with  . Hyperglycemia     (Consider location/radiation/quality/duration/timing/severity/associated sxs/prior Treatment) HPI Comments: Pt comes in with c/o ruq abdominal pain that started yesterday. She states that she has known gallstones and she saw the surgeon and they asked her to loose wt. Pt had ultrasound last week for similar symptoms. Pt states that she also hasn't been taking her diabetes medication for the last month because she can't afford time. Denies fever, vomiting, diarrhea, cough or cp.  The history is provided by the patient. No language interpreter was used.    Past Medical History  Diagnosis Date  . Diabetes mellitus without complication   . COPD (chronic obstructive pulmonary disease)   . Obesity   . Hypertension    Past Surgical History  Procedure Laterality Date  . Thyroidectomy    . Cesarean section     No family history on file. History  Substance Use Topics  . Smoking status: Never Smoker   . Smokeless tobacco: Not on file  . Alcohol Use: No   OB History    No data available     Review of Systems  All other systems reviewed and are negative.     Allergies  Review of patient's allergies indicates no known allergies.  Home Medications   Prior to Admission medications   Medication Sig Start Date End Date Taking? Authorizing Provider  albuterol (PROVENTIL HFA;VENTOLIN HFA) 108 (90 BASE) MCG/ACT inhaler Inhale 1-2 puffs into the lungs every 6 (six) hours as needed for wheezing or shortness of breath.    Historical Provider, MD  albuterol (PROVENTIL) (2.5 MG/3ML) 0.083% nebulizer solution Take 2.5 mg by nebulization 2 (two) times daily.    Historical Provider, MD  beclomethasone (QVAR) 80 MCG/ACT inhaler Inhale 2 puffs into the lungs 2 (two) times daily.    Historical Provider, MD   Canagliflozin (INVOKANA) 100 MG TABS Take 100 mg by mouth daily.     Historical Provider, MD  clindamycin (CLEOCIN) 150 MG capsule Take 1 capsule (150 mg total) by mouth every 6 (six) hours. 05/10/14   Shon Batonourtney F Horton, MD  gabapentin (NEURONTIN) 300 MG capsule Take 1 capsule (300 mg total) by mouth 3 (three) times daily. Patient not taking: Reported on 05/17/2014 05/11/14   Azalia BilisKevin Campos, MD  ibuprofen (ADVIL,MOTRIN) 600 MG tablet Take 1 tablet (600 mg total) by mouth every 6 (six) hours as needed. Patient taking differently: Take 600 mg by mouth every 6 (six) hours as needed for moderate pain.  05/10/14   Shon Batonourtney F Horton, MD  insulin glargine (LANTUS) 100 unit/mL SOPN Inject 59 Units into the skin 2 (two) times daily. 30 units in the morning and 60 units at night    Historical Provider, MD  metFORMIN (GLUCOPHAGE) 500 MG tablet Take 1 tablet (500 mg total) by mouth 2 (two) times daily with a meal. 08/21/13   Doug SouSam Jacubowitz, MD  ondansetron (ZOFRAN) 4 MG tablet Take 1 tablet (4 mg total) by mouth every 6 (six) hours as needed for nausea or vomiting. 05/18/14   Dione Boozeavid Glick, MD  oxybutynin (DITROPAN) 5 MG tablet Take 5 mg by mouth every 8 (eight) hours as needed for bladder spasms.     Historical Provider, MD  oxyCODONE-acetaminophen (PERCOCET) 5-325 MG per tablet Take 1 tablet by mouth every 4 (four) hours as  needed for moderate pain. 05/18/14   Dione Boozeavid Glick, MD  sitaGLIPtin (JANUVIA) 100 MG tablet Take 100 mg by mouth daily.    Historical Provider, MD  tiotropium (SPIRIVA) 18 MCG inhalation capsule Place 18 mcg into inhaler and inhale daily as needed (shortness of breath).     Historical Provider, MD   BP 159/94 mmHg  Pulse 75  Temp(Src) 98.4 F (36.9 C) (Oral)  Resp 20  Ht 5\' 7"  (1.702 m)  Wt 312 lb (141.522 kg)  BMI 48.85 kg/m2  SpO2 100%  LMP 04/14/2014 Physical Exam  Constitutional: She appears well-developed and well-nourished.  HENT:  Head: Normocephalic.  Eyes: Conjunctivae and EOM are  normal. Pupils are equal, round, and reactive to light.  Cardiovascular: Normal rate and regular rhythm.   Pulmonary/Chest: Effort normal and breath sounds normal.  Abdominal: Soft. Bowel sounds are normal. There is tenderness in the right lower quadrant.  Musculoskeletal: Normal range of motion.  Nursing note and vitals reviewed.   ED Course  Procedures (including critical care time) Labs Review Labs Reviewed  URINALYSIS, ROUTINE W REFLEX MICROSCOPIC - Abnormal; Notable for the following:    Specific Gravity, Urine >1.046 (*)    Glucose, UA >1000 (*)    Bilirubin Urine SMALL (*)    All other components within normal limits  COMPREHENSIVE METABOLIC PANEL - Abnormal; Notable for the following:    Sodium 133 (*)    Chloride 98 (*)    Glucose, Bld 345 (*)    Albumin 3.1 (*)    AST 69 (*)    ALT 96 (*)    Alkaline Phosphatase 209 (*)    All other components within normal limits  URINE MICROSCOPIC-ADD ON - Abnormal; Notable for the following:    Squamous Epithelial / LPF FEW (*)    All other components within normal limits  CBG MONITORING, ED - Abnormal; Notable for the following:    Glucose-Capillary 333 (*)    All other components within normal limits  CBG MONITORING, ED - Abnormal; Notable for the following:    Glucose-Capillary 221 (*)    All other components within normal limits  PREGNANCY, URINE  LIPASE, BLOOD  CBC WITH DIFFERENTIAL/PLATELET    Imaging Review No results found.   EKG Interpretation None      MDM   Final diagnoses:  Gallstone  Hyperglycemia    Pt had us in the last week that showed stone. No fever pain controlled with one dose of morphine. Don't think another us is needed at this time. Discussed with pt the importance of taking diabetes medication and appropriate foods for diabetic and to prevent gallbladder flare. Pt given referral to [facility]    Teressa LowerVrinda Rondall Radigan, NP 05/25/14 1419  Elwin MochaBlair Walden, MD 05/25/14 1524

## 2014-05-25 NOTE — Discharge Instructions (Signed)
Cholelithiasis  Cholelithiasis (also called gallstones) is a form of gallbladder disease. The gallbladder is a small organ that helps you digest fats. Symptoms of gallstones are:  · Feeling sick to your stomach (nausea).  · Throwing up (vomiting).  · Belly pain.  · Yellowing of the skin (jaundice).  · Sudden pain. You may feel the pain for minutes to hours.  · Fever.  · Pain to the touch.  HOME CARE  · Only take medicines as told by your doctor.  · Eat a low-fat diet until you see your doctor again. Eating fat can result in pain.  · Follow up with your doctor as told. Attacks usually happen time after time. Surgery is usually needed for permanent treatment.  GET HELP RIGHT AWAY IF:   · Your pain gets worse.  · Your pain is not helped by medicines.  · You have a fever and lasting symptoms for more than 2-3 days.  · You have a fever and your symptoms suddenly get worse.  · You keep feeling sick to your stomach and throwing up.  MAKE SURE YOU:   · Understand these instructions.  · Will watch your condition.  · Will get help right away if you are not doing well or get worse.  Document Released: 06/11/2007 Document Revised: 08/25/2012 Document Reviewed: 06/16/2012  [employer]® Patient Information ©2015 [employer]. This information is not intended to replace advice given to you by your health care provider. Make sure you discuss any questions you have with your health care provider.

## 2014-05-25 NOTE — ED Notes (Signed)
States she stopped taking her Diabetes medications a month ago because she was unable to afford her medications. Here today with elevated BS and abdominal pain.

## 2014-05-25 NOTE — ED Notes (Signed)
CBG 221. Ross LudwigLori Lovett, RN notified.

## 2014-06-15 ENCOUNTER — Encounter (HOSPITAL_COMMUNITY): Payer: Self-pay

## 2014-06-15 ENCOUNTER — Emergency Department (HOSPITAL_COMMUNITY)
Admission: EM | Admit: 2014-06-15 | Discharge: 2014-06-16 | Disposition: A | Discharge status: Home / Self Care | Payer: Medicaid Other | Attending: Emergency Medicine | Admitting: Emergency Medicine

## 2014-06-15 ENCOUNTER — Emergency Department (HOSPITAL_COMMUNITY): Payer: Medicaid Other

## 2014-06-15 DIAGNOSIS — E669 Obesity, unspecified: Secondary | ICD-10-CM | POA: Insufficient documentation

## 2014-06-15 DIAGNOSIS — E119 Type 2 diabetes mellitus without complications: Secondary | ICD-10-CM | POA: Insufficient documentation

## 2014-06-15 DIAGNOSIS — Z794 Long term (current) use of insulin: Secondary | ICD-10-CM | POA: Diagnosis not present

## 2014-06-15 DIAGNOSIS — R1011 Right upper quadrant pain: Secondary | ICD-10-CM | POA: Diagnosis present

## 2014-06-15 DIAGNOSIS — Z79899 Other long term (current) drug therapy: Secondary | ICD-10-CM | POA: Diagnosis not present

## 2014-06-15 DIAGNOSIS — I10 Essential (primary) hypertension: Secondary | ICD-10-CM | POA: Diagnosis not present

## 2014-06-15 DIAGNOSIS — Z7951 Long term (current) use of inhaled steroids: Secondary | ICD-10-CM | POA: Insufficient documentation

## 2014-06-15 DIAGNOSIS — J449 Chronic obstructive pulmonary disease, unspecified: Secondary | ICD-10-CM | POA: Diagnosis not present

## 2014-06-15 DIAGNOSIS — K8050 Calculus of bile duct without cholangitis or cholecystitis without obstruction: Secondary | ICD-10-CM | POA: Insufficient documentation

## 2014-06-15 LAB — COMPREHENSIVE METABOLIC PANEL
ALBUMIN: 3.4 g/dL — AB (ref 3.5–5.0)
ALK PHOS: 190 U/L — AB (ref 38–126)
ALT: 85 U/L — AB (ref 14–54)
ANION GAP: 9 (ref 5–15)
AST: 75 U/L — ABNORMAL HIGH (ref 15–41)
BUN: 10 mg/dL (ref 6–20)
CALCIUM: 8.7 mg/dL — AB (ref 8.9–10.3)
CHLORIDE: 98 mmol/L — AB (ref 101–111)
CO2: 26 mmol/L (ref 22–32)
Creatinine, Ser: 0.73 mg/dL (ref 0.44–1.00)
GFR calc Af Amer: >60 mL/min (ref >60)
GFR calc non Af Amer: >60 mL/min (ref >60)
Glucose, Bld: 464 mg/dL — ABNORMAL HIGH (ref 65–99)
Potassium: 4.6 mmol/L (ref 3.5–5.1)
Sodium: 133 mmol/L — ABNORMAL LOW (ref 135–145)
TOTAL PROTEIN: 8.1 g/dL (ref 6.5–8.1)
Total Bilirubin: 0.5 mg/dL (ref 0.3–1.2)

## 2014-06-15 LAB — CBC WITH DIFFERENTIAL/PLATELET
Basophils Absolute: 0 10*3/uL (ref 0.0–0.1)
Basophils Relative: 0 % (ref 0–1)
EOS PCT: 1 % (ref 0–5)
Eosinophils Absolute: 0.1 10*3/uL (ref 0.0–0.7)
HCT: 42.3 % (ref 36.0–46.0)
HEMOGLOBIN: 13.9 g/dL (ref 12.0–15.0)
LYMPHS ABS: 3.5 10*3/uL (ref 0.7–4.0)
Lymphocytes Relative: 37 % (ref 12–46)
MCH: 29.6 pg (ref 26.0–34.0)
MCHC: 32.9 g/dL (ref 30.0–36.0)
MCV: 90 fL (ref 78.0–100.0)
MONOS PCT: 9 % (ref 3–12)
Monocytes Absolute: 0.8 10*3/uL (ref 0.1–1.0)
NEUTROS PCT: 53 % (ref 43–77)
Neutro Abs: 5.1 10*3/uL (ref 1.7–7.7)
Platelets: 222 10*3/uL (ref 150–400)
RBC: 4.7 MIL/uL (ref 3.87–5.11)
RDW: 13.4 % (ref 11.5–15.5)
WBC: 9.5 10*3/uL (ref 4.0–10.5)

## 2014-06-15 LAB — CBG MONITORING, ED: Glucose-Capillary: 352 mg/dL — ABNORMAL HIGH (ref 65–99)

## 2014-06-15 LAB — LIPASE, BLOOD: Lipase: 26 U/L (ref 22–51)

## 2014-06-15 MED ORDER — SODIUM CHLORIDE 0.9 % IV BOLUS (SEPSIS)
1000.0000 mL | Freq: Once | INTRAVENOUS | Status: AC
  Administered 2014-06-15: 1000 mL via INTRAVENOUS

## 2014-06-15 MED ORDER — ONDANSETRON 8 MG PO TBDP: 8.0000 mg | ORAL_TABLET | Freq: Three times a day (TID) | ORAL | Status: DC | PRN

## 2014-06-15 MED ORDER — ONDANSETRON HCL 4 MG/2ML IJ SOLN
4.0000 mg | Freq: Once | INTRAMUSCULAR | Status: AC
  Administered 2014-06-15: 4 mg via INTRAVENOUS
  Filled 2014-06-15: qty 2

## 2014-06-15 MED ORDER — INSULIN ASPART 100 UNIT/ML ~~LOC~~ SOLN
10.0000 [IU] | Freq: Once | SUBCUTANEOUS | Status: AC
  Administered 2014-06-15: 10 [IU] via INTRAVENOUS
  Filled 2014-06-15: qty 1

## 2014-06-15 MED ORDER — MORPHINE SULFATE 4 MG/ML IJ SOLN
4.0000 mg | Freq: Once | INTRAMUSCULAR | Status: AC
  Administered 2014-06-15: 4 mg via INTRAVENOUS
  Filled 2014-06-15: qty 1

## 2014-06-15 MED ORDER — HYDROCODONE-ACETAMINOPHEN 5-325 MG PO TABS: 1.0000 | ORAL_TABLET | Freq: Four times a day (QID) | ORAL | Status: DC | PRN

## 2014-06-15 MED ORDER — HYDROCODONE-ACETAMINOPHEN 5-325 MG PO TABS
2.0000 | ORAL_TABLET | Freq: Once | ORAL | Status: AC
Start: 2014-06-15 — End: 2014-06-16
  Administered 2014-06-16: 2 via ORAL
  Filled 2014-06-15: qty 2

## 2014-06-15 NOTE — ED Provider Notes (Signed)
CSN: [id number]     Arrival date & time 06/15/14  2042 History   First MD Initiated Contact with Patient 06/15/14 2113     Chief Complaint  Patient presents with  . Cholelithiasis     (Consider location/radiation/quality/duration/timing/severity/associated sxs/prior Treatment) HPI Comments: Pt comes in with cc of abd pain. Pt has hx of cholelithiasis. Pt reports that she has been having increased pain since Feb, and the current pain has been present for about a week. Pain is fairly constant. She has no n/v/f/c.   The history is provided by the patient.    Past Medical History  Diagnosis Date  . Diabetes mellitus without complication   . COPD (chronic obstructive pulmonary disease)   . Obesity   . Hypertension    Past Surgical History  Procedure Laterality Date  . Thyroidectomy    . Cesarean section     History reviewed. No pertinent family history. History  Substance Use Topics  . Smoking status: Never Smoker   . Smokeless tobacco: Not on file  . Alcohol Use: No   OB History    No data available     Review of Systems  Constitutional: Negative for activity change.  Respiratory: Negative for shortness of breath.   Cardiovascular: Negative for chest pain.  Gastrointestinal: Positive for abdominal pain. Negative for nausea and vomiting.  Genitourinary: Negative for dysuria.  Musculoskeletal: Negative for neck pain.  Neurological: Negative for headaches.  All other systems reviewed and are negative.     Allergies  Review of patient's allergies indicates no known allergies.  Home Medications   Prior to Admission medications   Medication Sig Start Date End Date Taking? Authorizing Provider  albuterol (PROVENTIL HFA;VENTOLIN HFA) 108 (90 BASE) MCG/ACT inhaler Inhale 1-2 puffs into the lungs every 6 (six) hours as needed for wheezing or shortness of breath.   Yes Historical Provider, MD  albuterol (PROVENTIL) (2.5 MG/3ML) 0.083% nebulizer solution Take 2.5 mg by  nebulization 2 (two) times daily.   Yes Historical Provider, MD  beclomethasone (QVAR) 80 MCG/ACT inhaler Inhale 2 puffs into the lungs 2 (two) times daily.   Yes Historical Provider, MD  Canagliflozin (INVOKANA) 100 MG TABS Take 100 mg by mouth daily.    Yes Historical Provider, MD  ibuprofen (ADVIL,MOTRIN) 600 MG tablet Take 1 tablet (600 mg total) by mouth every 6 (six) hours as needed. Patient taking differently: Take 600 mg by mouth every 6 (six) hours as needed for moderate pain.  05/10/14  Yes Shon Baton, MD  insulin glargine (LANTUS) 100 unit/mL SOPN Inject 60 Units into the skin 2 (two) times daily. 60 units in the morning and 60 units at night   Yes Historical Provider, MD  metFORMIN (GLUCOPHAGE) 500 MG tablet Take 1 tablet (500 mg total) by mouth 2 (two) times daily with a meal. 08/21/13  Yes Doug Sou, MD  ondansetron (ZOFRAN) 4 MG tablet Take 1 tablet (4 mg total) by mouth every 6 (six) hours as needed for nausea or vomiting. 05/18/14  Yes Dione Booze, MD  sitaGLIPtin (JANUVIA) 100 MG tablet Take 100 mg by mouth daily.   Yes Historical Provider, MD  solifenacin (VESICARE) 5 MG tablet Take 5 mg by mouth daily.   Yes Historical Provider, MD  tiotropium (SPIRIVA) 18 MCG inhalation capsule Place 18 mcg into inhaler and inhale daily as needed (shortness of breath).    Yes Historical Provider, MD  clindamycin (CLEOCIN) 150 MG capsule Take 1 capsule (150 mg total) by mouth  every 6 (six) hours. Patient not taking: Reported on 06/15/2014 05/10/14   Shon Baton, MD  gabapentin (NEURONTIN) 300 MG capsule Take 1 capsule (300 mg total) by mouth 3 (three) times daily. Patient not taking: Reported on 05/17/2014 05/11/14   Azalia Bilis, MD  HYDROcodone-acetaminophen (NORCO/VICODIN) 5-325 MG per tablet Take 1 tablet by mouth every 6 (six) hours as needed. 06/15/14   Derwood Kaplan, MD  ondansetron (ZOFRAN ODT) 8 MG disintegrating tablet Take 1 tablet (8 mg total) by mouth every 8 (eight) hours as  needed for nausea. 06/15/14   Derwood Kaplan, MD  oxyCODONE-acetaminophen (PERCOCET) 5-325 MG per tablet Take 1 tablet by mouth every 4 (four) hours as needed for moderate pain. Patient not taking: Reported on 06/15/2014 05/18/14   Dione Booze, MD   BP 125/45 mmHg  Pulse 99  Temp(Src) 98.5 F (36.9 C) (Oral)  Resp 20  SpO2 99%  LMP 06/15/2014 Physical Exam  Constitutional: She is oriented to person, place, and time. She appears well-developed.  HENT:  Head: Normocephalic and atraumatic.  Eyes: Conjunctivae and EOM are normal. Pupils are equal, round, and reactive to light.  Neck: Normal range of motion. Neck supple.  Cardiovascular: Normal rate, regular rhythm, normal heart sounds and intact distal pulses.   Pulmonary/Chest: Effort normal. No respiratory distress. She has no wheezes.  Abdominal: Soft. Bowel sounds are normal. She exhibits no distension. There is tenderness. There is guarding. There is no rebound.  RUQ tenderness, with no rebound or guarding.  Neurological: She is alert and oriented to person, place, and time.  Skin: Skin is warm and dry.    ED Course  Procedures (including critical care time) Labs Review Labs Reviewed  COMPREHENSIVE METABOLIC PANEL - Abnormal; Notable for the following:    Sodium 133 (*)    Chloride 98 (*)    Glucose, Bld 464 (*)    Calcium 8.7 (*)    Albumin 3.4 (*)    AST 75 (*)    ALT 85 (*)    Alkaline Phosphatase 190 (*)    All other components within normal limits  CBG MONITORING, ED - Abnormal; Notable for the following:    Glucose-Capillary 352 (*)    All other components within normal limits  CBC WITH DIFFERENTIAL/PLATELET  LIPASE, BLOOD    Imaging Review US Abdomen Limited Ruq  06/15/2014   CLINICAL DATA:  44 year old female with a history of abdominal pain.  EXAM: US ABDOMEN LIMITED - RIGHT UPPER QUADRANT  COMPARISON:  05/18/2014  FINDINGS: Gallbladder:  Multiple dependently layered focal reflectors with posterior acoustic  shadowing within the gallbladder lumen. Negative sonographic Murphy's sign.  Common bile duct:  Diameter: 6 mm -7 mm.  Liver:  Heterogeneous echotexture/ echo contour of the liver.  IMPRESSION: Ultrasound survey demonstrates cholelithiasis without sonographic evidence of acute cholecystitis. If there is ongoing concern, correlation with HIDA study may be useful.  Steatosis.  Signed,  Yvone Neu. Loreta Ave, DO  [facility]  [facility]   Electronically Signed   By: Gilmer Mor D.O.   On: 06/15/2014 22:58     EKG Interpretation None      MDM   Final diagnoses:  Biliary colic    Pt with RUQ abd pain. LFTs are slightly elevated. Pt got RUQ [place] - and there is evidence of cholelithiasis.  Pt was reassessed, and she feels better, and her pain is a lot tolerable. Strict return precautions discussed, and pt advised to see Surgery as soon  as possible.  Derwood Kaplan, MD 06/15/14 [phone]

## 2014-06-15 NOTE — Discharge Instructions (Signed)
Please return to the ER if your symptoms worsen; you have increased pain, fevers, chills, inability to keep any medications down. Otherwise see the outpatient doctor as requested.   Biliary Colic  Biliary colic is a steady or irregular pain in the upper abdomen. It is usually under the right side of the rib cage. It happens when gallstones interfere with the normal flow of bile from the gallbladder. Bile is a liquid that helps to digest fats. Bile is made in the liver and stored in the gallbladder. When you eat a meal, bile passes from the gallbladder through the cystic duct and the common bile duct into the small intestine. There, it mixes with partially digested food. If a gallstone blocks either of these ducts, the normal flow of bile is blocked. The muscle cells in the bile duct contract forcefully to try to move the stone. This causes the pain of biliary colic.  SYMPTOMS   A person with biliary colic usually complains of pain in the upper abdomen. This pain can be:  In the center of the upper abdomen just below the breastbone.  In the upper-right part of the abdomen, near the gallbladder and liver.  Spread back toward the right shoulder blade.  Nausea and vomiting.  The pain usually occurs after eating.  Biliary colic is usually triggered by the digestive system's demand for bile. The demand for bile is high after fatty meals. Symptoms can also occur when a person who has been fasting suddenly eats a very large meal. Most episodes of biliary colic pass after 1 to 5 hours. After the most intense pain passes, your abdomen may continue to ache mildly for about 24 hours. DIAGNOSIS  After you describe your symptoms, your caregiver will perform a physical exam. He or she will pay attention to the upper right portion of your belly (abdomen). This is the area of your liver and gallbladder. An ultrasound will help your caregiver look for gallstones. Specialized scans of the gallbladder may also be  done. Blood tests may be done, especially if you have fever or if your pain persists. PREVENTION  Biliary colic can be prevented by controlling the risk factors for gallstones. Some of these risk factors, such as heredity, increasing age, and pregnancy are a normal part of life. Obesity and a high-fat diet are risk factors you can change through a healthy lifestyle. Women going through menopause who take hormone replacement therapy (estrogen) are also more likely to develop biliary colic. TREATMENT   Pain medication may be prescribed.  You may be encouraged to eat a fat-free diet.  If the first episode of biliary colic is severe, or episodes of colic keep retuning, surgery to remove the gallbladder (cholecystectomy) is usually recommended. This procedure can be done through small incisions using an instrument called a laparoscope. The procedure often requires a brief stay in the hospital. Some people can leave the hospital the same day. It is the most widely used treatment in people troubled by painful gallstones. It is effective and safe, with no complications in more than 90% of cases.  If surgery cannot be done, medication that dissolves gallstones may be used. This medication is expensive and can take months or years to work. Only small stones will dissolve.  Rarely, medication to dissolve gallstones is combined with a procedure called shock-wave lithotripsy. This procedure uses carefully aimed shock waves to break up gallstones. In many people treated with this procedure, gallstones form again within a few years. PROGNOSIS  If gallstones block your cystic duct or common bile duct, you are at risk for repeated episodes of biliary colic. There is also a 25% chance that you will develop a gallbladder infection(acute cholecystitis), or some other complication of gallstones within 10 to 20 years. If you have surgery, schedule it at a time that is convenient for you and at a time when you are not  sick. HOME CARE INSTRUCTIONS   Drink plenty of clear fluids.  Avoid fatty, greasy or fried foods, or any foods that make your pain worse.  Take medications as directed. SEEK MEDICAL CARE IF:   You develop a fever over 100.5 F (38.1 C).  Your pain gets worse over time.  You develop nausea that prevents you from eating and drinking.  You develop vomiting. SEEK IMMEDIATE MEDICAL CARE IF:   You have continuous or severe belly (abdominal) pain which is not relieved with medications.  You develop nausea and vomiting which is not relieved with medications.  You have symptoms of biliary colic and you suddenly develop a fever and shaking chills. This may signal cholecystitis. Call your caregiver immediately.  You develop a yellow color to your skin or the white part of your eyes (jaundice). Document Released: 05/26/2005 Document Revised: 03/17/2011 Document Reviewed: 08/05/2007 [facility] Patient Information 2015 [place], [place]. This information is not intended to replace advice given to you by your health care provider. Make sure you discuss any questions you have with your health care provider.

## 2014-06-15 NOTE — ED Notes (Signed)
Pt was dx with gallstones in 2012, the Dr will not do surgery until she loses weight, pt states she started having pain again one week ago

## 2014-09-29 DIAGNOSIS — E119 Type 2 diabetes mellitus without complications: Secondary | ICD-10-CM | POA: Insufficient documentation

## 2014-09-29 DIAGNOSIS — J449 Chronic obstructive pulmonary disease, unspecified: Secondary | ICD-10-CM | POA: Diagnosis not present

## 2014-09-29 DIAGNOSIS — L02211 Cutaneous abscess of abdominal wall: Secondary | ICD-10-CM | POA: Diagnosis not present

## 2014-09-29 DIAGNOSIS — Z792 Long term (current) use of antibiotics: Secondary | ICD-10-CM | POA: Diagnosis not present

## 2014-09-29 DIAGNOSIS — Z7951 Long term (current) use of inhaled steroids: Secondary | ICD-10-CM | POA: Insufficient documentation

## 2014-09-29 DIAGNOSIS — R011 Cardiac murmur, unspecified: Secondary | ICD-10-CM | POA: Insufficient documentation

## 2014-09-29 DIAGNOSIS — Y998 Other external cause status: Secondary | ICD-10-CM | POA: Insufficient documentation

## 2014-09-29 DIAGNOSIS — I10 Essential (primary) hypertension: Secondary | ICD-10-CM | POA: Diagnosis not present

## 2014-09-29 DIAGNOSIS — Y9389 Activity, other specified: Secondary | ICD-10-CM | POA: Diagnosis not present

## 2014-09-29 DIAGNOSIS — Y92410 Unspecified street and highway as the place of occurrence of the external cause: Secondary | ICD-10-CM | POA: Diagnosis not present

## 2014-09-29 DIAGNOSIS — Z3202 Encounter for pregnancy test, result negative: Secondary | ICD-10-CM | POA: Insufficient documentation

## 2014-09-29 DIAGNOSIS — Z794 Long term (current) use of insulin: Secondary | ICD-10-CM | POA: Diagnosis not present

## 2014-09-29 DIAGNOSIS — S301XXA Contusion of abdominal wall, initial encounter: Secondary | ICD-10-CM | POA: Insufficient documentation

## 2014-09-29 DIAGNOSIS — S3991XA Unspecified injury of abdomen, initial encounter: Secondary | ICD-10-CM | POA: Diagnosis present

## 2014-09-29 DIAGNOSIS — E669 Obesity, unspecified: Secondary | ICD-10-CM | POA: Diagnosis not present

## 2014-09-29 DIAGNOSIS — Z79899 Other long term (current) drug therapy: Secondary | ICD-10-CM | POA: Insufficient documentation

## 2014-09-30 ENCOUNTER — Encounter (HOSPITAL_BASED_OUTPATIENT_CLINIC_OR_DEPARTMENT_OTHER): Payer: Self-pay | Admitting: Emergency Medicine

## 2014-09-30 ENCOUNTER — Emergency Department (HOSPITAL_BASED_OUTPATIENT_CLINIC_OR_DEPARTMENT_OTHER)
Admission: EM | Admit: 2014-09-30 | Discharge: 2014-09-30 | Disposition: A | Discharge status: Home / Self Care | Payer: No Typology Code available for payment source | Attending: Emergency Medicine | Admitting: Emergency Medicine

## 2014-09-30 DIAGNOSIS — S301XXA Contusion of abdominal wall, initial encounter: Secondary | ICD-10-CM | POA: Diagnosis not present

## 2014-09-30 DIAGNOSIS — L02211 Cutaneous abscess of abdominal wall: Secondary | ICD-10-CM

## 2014-09-30 HISTORY — DX: Cardiac murmur, unspecified: R01.1

## 2014-09-30 LAB — URINALYSIS, ROUTINE W REFLEX MICROSCOPIC
Bilirubin Urine: NEGATIVE
Glucose, UA: >1000 mg/dL — AB
Hgb urine dipstick: NEGATIVE
KETONES UR: NEGATIVE mg/dL
LEUKOCYTES UA: NEGATIVE
NITRITE: NEGATIVE
PH: 6.5 (ref 5.0–8.0)
Protein, ur: NEGATIVE mg/dL
SPECIFIC GRAVITY, URINE: 1.044 — AB (ref 1.005–1.030)
Urobilinogen, UA: 1 mg/dL (ref 0.0–1.0)

## 2014-09-30 LAB — URINE MICROSCOPIC-ADD ON

## 2014-09-30 LAB — PREGNANCY, URINE: PREG TEST UR: NEGATIVE

## 2014-09-30 MED ORDER — FLUCONAZOLE 100 MG PO TABS
200.0000 mg | ORAL_TABLET | Freq: Once | ORAL | Status: AC
  Administered 2014-09-30: 200 mg via ORAL
  Filled 2014-09-30: qty 2

## 2014-09-30 MED ORDER — HYDROCODONE-ACETAMINOPHEN 5-325 MG PO TABS: 1.0000 | ORAL_TABLET | Freq: Four times a day (QID) | ORAL | Status: DC | PRN

## 2014-09-30 MED ORDER — MUPIROCIN CALCIUM 2 % EX CREA
TOPICAL_CREAM | Freq: Three times a day (TID) | CUTANEOUS | Status: DC
  Administered 2014-09-30: 02:00:00 via TOPICAL
  Filled 2014-09-30: qty 15
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
## 2014-12-24 ENCOUNTER — Emergency Department (HOSPITAL_BASED_OUTPATIENT_CLINIC_OR_DEPARTMENT_OTHER): Payer: Medicaid Other

## 2014-12-24 ENCOUNTER — Encounter (HOSPITAL_BASED_OUTPATIENT_CLINIC_OR_DEPARTMENT_OTHER): Payer: Self-pay | Admitting: Emergency Medicine

## 2014-12-24 ENCOUNTER — Emergency Department (HOSPITAL_BASED_OUTPATIENT_CLINIC_OR_DEPARTMENT_OTHER)
Admission: EM | Admit: 2014-12-24 | Discharge: 2014-12-24 | Disposition: A | Discharge status: Home / Self Care | Payer: Medicaid Other | Attending: Emergency Medicine | Admitting: Emergency Medicine

## 2014-12-24 DIAGNOSIS — J449 Chronic obstructive pulmonary disease, unspecified: Secondary | ICD-10-CM | POA: Insufficient documentation

## 2014-12-24 DIAGNOSIS — R739 Hyperglycemia, unspecified: Secondary | ICD-10-CM

## 2014-12-24 DIAGNOSIS — K5900 Constipation, unspecified: Secondary | ICD-10-CM | POA: Diagnosis not present

## 2014-12-24 DIAGNOSIS — R1032 Left lower quadrant pain: Secondary | ICD-10-CM

## 2014-12-24 DIAGNOSIS — R011 Cardiac murmur, unspecified: Secondary | ICD-10-CM | POA: Insufficient documentation

## 2014-12-24 DIAGNOSIS — K7460 Unspecified cirrhosis of liver: Secondary | ICD-10-CM | POA: Diagnosis not present

## 2014-12-24 DIAGNOSIS — Z7984 Long term (current) use of oral hypoglycemic drugs: Secondary | ICD-10-CM | POA: Insufficient documentation

## 2014-12-24 DIAGNOSIS — E1165 Type 2 diabetes mellitus with hyperglycemia: Secondary | ICD-10-CM | POA: Diagnosis not present

## 2014-12-24 DIAGNOSIS — E669 Obesity, unspecified: Secondary | ICD-10-CM | POA: Diagnosis not present

## 2014-12-24 DIAGNOSIS — Z79899 Other long term (current) drug therapy: Secondary | ICD-10-CM | POA: Insufficient documentation

## 2014-12-24 DIAGNOSIS — I10 Essential (primary) hypertension: Secondary | ICD-10-CM | POA: Diagnosis not present

## 2014-12-24 DIAGNOSIS — Z3202 Encounter for pregnancy test, result negative: Secondary | ICD-10-CM | POA: Insufficient documentation

## 2014-12-24 DIAGNOSIS — Z794 Long term (current) use of insulin: Secondary | ICD-10-CM | POA: Insufficient documentation

## 2014-12-24 DIAGNOSIS — R112 Nausea with vomiting, unspecified: Secondary | ICD-10-CM

## 2014-12-24 DIAGNOSIS — Z7951 Long term (current) use of inhaled steroids: Secondary | ICD-10-CM | POA: Insufficient documentation

## 2014-12-24 HISTORY — DX: Disorder of thyroid, unspecified: E07.9

## 2014-12-24 HISTORY — DX: Unspecified cirrhosis of liver: K74.60

## 2014-12-24 LAB — URINALYSIS, ROUTINE W REFLEX MICROSCOPIC
BILIRUBIN URINE: NEGATIVE
HGB URINE DIPSTICK: NEGATIVE
KETONES UR: NEGATIVE mg/dL
Leukocytes, UA: NEGATIVE
Nitrite: NEGATIVE
PH: 6 (ref 5.0–8.0)
PROTEIN: NEGATIVE mg/dL
Specific Gravity, Urine: 1.043 — ABNORMAL HIGH (ref 1.005–1.030)

## 2014-12-24 LAB — URINE MICROSCOPIC-ADD ON

## 2014-12-24 LAB — COMPREHENSIVE METABOLIC PANEL
ALK PHOS: 242 U/L — AB (ref 38–126)
ALT: 162 U/L — ABNORMAL HIGH (ref 14–54)
ANION GAP: 8 (ref 5–15)
AST: 125 U/L — ABNORMAL HIGH (ref 15–41)
Albumin: 3.3 g/dL — ABNORMAL LOW (ref 3.5–5.0)
BUN: 12 mg/dL (ref 6–20)
CALCIUM: 9 mg/dL (ref 8.9–10.3)
CO2: 26 mmol/L (ref 22–32)
Chloride: 98 mmol/L — ABNORMAL LOW (ref 101–111)
Creatinine, Ser: 0.79 mg/dL (ref 0.44–1.00)
GFR calc non Af Amer: >60 mL/min (ref >60)
Glucose, Bld: 519 mg/dL — ABNORMAL HIGH (ref 65–99)
Potassium: 3.8 mmol/L (ref 3.5–5.1)
SODIUM: 132 mmol/L — AB (ref 135–145)
Total Bilirubin: 0.7 mg/dL (ref 0.3–1.2)
Total Protein: 8 g/dL (ref 6.5–8.1)

## 2014-12-24 LAB — LIPASE, BLOOD: Lipase: 48 U/L (ref 11–51)

## 2014-12-24 LAB — CBC WITH DIFFERENTIAL/PLATELET
Basophils Absolute: 0.1 10*3/uL (ref 0.0–0.1)
Basophils Relative: 1 %
EOS PCT: 1 %
Eosinophils Absolute: 0.1 10*3/uL (ref 0.0–0.7)
HEMATOCRIT: 39.9 % (ref 36.0–46.0)
Hemoglobin: 13.2 g/dL (ref 12.0–15.0)
LYMPHS PCT: 39 %
Lymphs Abs: 3.7 10*3/uL (ref 0.7–4.0)
MCH: 29.1 pg (ref 26.0–34.0)
MCHC: 33.1 g/dL (ref 30.0–36.0)
MCV: 88.1 fL (ref 78.0–100.0)
MONO ABS: 0.8 10*3/uL (ref 0.1–1.0)
Monocytes Relative: 9 %
NEUTROS ABS: 5 10*3/uL (ref 1.7–7.7)
NEUTROS PCT: 50 %
PLATELETS: 230 10*3/uL (ref 150–400)
RBC: 4.53 MIL/uL (ref 3.87–5.11)
RDW: 12.8 % (ref 11.5–15.5)
WBC: 9.7 10*3/uL (ref 4.0–10.5)

## 2014-12-24 LAB — PREGNANCY, URINE: Preg Test, Ur: NEGATIVE

## 2014-12-24 LAB — CBG MONITORING, ED
GLUCOSE-CAPILLARY: 363 mg/dL — AB (ref 65–99)
GLUCOSE-CAPILLARY: 354 mg/dL — AB (ref 65–99)
GLUCOSE-CAPILLARY: 287 mg/dL — AB (ref 65–99)

## 2014-12-24 MED ORDER — SODIUM CHLORIDE 0.9 % IV SOLN
INTRAVENOUS | Status: DC
  Administered 2014-12-24: 4.6 [IU]/h via INTRAVENOUS
  Filled 2014-12-24: qty 2.5

## 2014-12-24 MED ORDER — SODIUM CHLORIDE 0.9 % IV BOLUS (SEPSIS)
1000.0000 mL | Freq: Once | INTRAVENOUS | Status: AC
  Administered 2014-12-24: 1000 mL via INTRAVENOUS

## 2014-12-24 MED ORDER — FENTANYL CITRATE (PF) 100 MCG/2ML IJ SOLN
100.0000 ug | Freq: Once | INTRAMUSCULAR | Status: AC
  Administered 2014-12-24: 100 ug via INTRAVENOUS
  Filled 2014-12-24: qty 2

## 2014-12-24 MED ORDER — ONDANSETRON 8 MG PO TBDP: 8.0000 mg | ORAL_TABLET | Freq: Three times a day (TID) | ORAL | Status: DC | PRN

## 2014-12-24 MED ORDER — OXYCODONE HCL 5 MG PO TABS: 5.0000 mg | ORAL_TABLET | ORAL | Status: DC | PRN

## 2014-12-24 MED ORDER — IOHEXOL 300 MG/ML  SOLN
25.0000 mL | Freq: Once | INTRAMUSCULAR | Status: AC | PRN
  Administered 2014-12-24: 25 mL via ORAL

## 2014-12-24 MED ORDER — PANTOPRAZOLE SODIUM 40 MG IV SOLR
40.0000 mg | Freq: Once | INTRAVENOUS | Status: AC
  Administered 2014-12-24: 40 mg via INTRAVENOUS
  Filled 2014-12-24: qty 40

## 2014-12-24 MED ORDER — IOHEXOL 300 MG/ML  SOLN
100.0000 mL | Freq: Once | INTRAMUSCULAR | Status: AC | PRN
  Administered 2014-12-24: 100 mL via INTRAVENOUS

## 2014-12-24 MED ORDER — ONDANSETRON HCL 4 MG/2ML IJ SOLN
4.0000 mg | Freq: Once | INTRAMUSCULAR | Status: AC
  Administered 2014-12-24: 4 mg via INTRAVENOUS
  Filled 2014-12-24: qty 2

## 2014-12-24 NOTE — ED Provider Notes (Signed)
CSN: [id number]     Arrival date & time 12/24/14  0034 History  By signing my name below, I, Terrance Branch, attest that this documentation has been prepared under the direction and in the presence of Paula Libra, MD. Electronically Signed: Evon Slack, ED Scribe. 12/24/2014. 3:28 AM.     Chief Complaint  Patient presents with  . Abdominal Pain   The history is provided by the patient. No language interpreter was used.   HPI Comments: Sheila Gibson is a 44 y.o. female with a history of diabetes and unspecified liver disorder who presents to the Emergency Department complaining of abdominal pain onset 6 days ago with associated nausea, vomiting, heartburn and constipation. Her pain is primarily in the left lower quadrant and she rates it a 10 out of 10. Pt states that she has tried Pepto bismol and Alka-Seltzer with no relief, partly because she is not been able to keep them down. Pt also reports intermittent tinnitus in her bilateral ears that has been present for a long time and has not acutely changed; she denies aspirin use. She denies diarrhea.   Past Medical History  Diagnosis Date  . Diabetes mellitus without complication (HCC)   . COPD (chronic obstructive pulmonary disease) (HCC)   . Obesity   . Hypertension   . Heart murmur   . Thyroid disease    Past Surgical History  Procedure Laterality Date  . Thyroidectomy    . Cesarean section     History reviewed. No pertinent family history. Social History  Substance Use Topics  . Smoking status: Never Smoker   . Smokeless tobacco: None  . Alcohol Use: No   OB History    No data available     Review of Systems  Gastrointestinal: Positive for nausea, vomiting, abdominal pain and constipation.   A complete 10 system review of systems was obtained and all systems are negative except as noted in the HPI and PMH.     Allergies  Review of patient's allergies indicates no known allergies.  Home Medications   Prior  to Admission medications   Medication Sig Start Date End Date Taking? Authorizing Provider  albuterol (PROVENTIL HFA;VENTOLIN HFA) 108 (90 BASE) MCG/ACT inhaler Inhale 1-2 puffs into the lungs every 6 (six) hours as needed for wheezing or shortness of breath.    Historical Provider, MD  albuterol (PROVENTIL) (2.5 MG/3ML) 0.083% nebulizer solution Take 2.5 mg by nebulization 2 (two) times daily.    Historical Provider, MD  beclomethasone (QVAR) 80 MCG/ACT inhaler Inhale 2 puffs into the lungs 2 (two) times daily.    Historical Provider, MD  Canagliflozin (INVOKANA) 100 MG TABS Take 100 mg by mouth daily.     Historical Provider, MD  HYDROcodone-acetaminophen (NORCO/VICODIN) 5-325 MG per tablet Take 1-2 tablets by mouth every 6 (six) hours as needed (for pain). 09/30/14   Jenai Scaletta, MD  insulin glargine (LANTUS) 100 unit/mL SOPN Inject 60 Units into the skin 2 (two) times daily. 60 units in the morning and 60 units at night    Historical Provider, MD  metFORMIN (GLUCOPHAGE) 500 MG tablet Take 1 tablet (500 mg total) by mouth 2 (two) times daily with a meal. 08/21/13   Doug Sou, MD  ondansetron (ZOFRAN ODT) 8 MG disintegrating tablet Take 1 tablet (8 mg total) by mouth every 8 (eight) hours as needed for nausea. 06/15/14   Derwood Kaplan, MD  ondansetron (ZOFRAN) 4 MG tablet Take 1 tablet (4 mg total) by mouth every 6 (  six) hours as needed for nausea or vomiting. 05/18/14   Dione Booze, MD  sitaGLIPtin (JANUVIA) 100 MG tablet Take 100 mg by mouth daily.    Historical Provider, MD  solifenacin (VESICARE) 5 MG tablet Take 5 mg by mouth daily.    Historical Provider, MD  tiotropium (SPIRIVA) 18 MCG inhalation capsule Place 18 mcg into inhaler and inhale daily as needed (shortness of breath).     Historical Provider, MD   BP 150/93 mmHg  Pulse 84  Temp(Src) 98.7 F (37.1 C) (Oral)  Resp 18  Ht  (1.702 m)  Wt 300 lb (136.079 kg)  BMI 46.98 kg/m2  SpO2 99%  LMP 11/21/2014   Physical  Exam General: Well-developed, well-nourished female in no acute distress or apparent discomfort; appearance consistent with age of record HENT: normocephalic; atraumatic; TM's normal Eyes: pupils equal, round and reactive to light; extraocular muscles intact Neck: supple Heart: regular rate and rhythm; 4/6 systolic murmur heard at right upper sternal border Lungs: clear to auscultation bilaterally Abdomen: soft; nondistended; diffuse tenderness most prominent in LLQ; no masses or hepatosplenomegaly; bowel sounds present Extremities: No deformity; full range of motion; pulses normal Neurologic: Awake, alert and oriented; motor function intact in all extremities and symmetric; no facial droop Skin: Warm and dry Psychiatric: Normal mood and affect  ED Course  Procedures (including critical care time) DIAGNOSTIC STUDIES: Oxygen Saturation is 100% on RA, normal by my interpretation.    COORDINATION OF CARE: 12:44 AM-Discussed treatment plan with pt at bedside and pt agreed to plan.     MDM   Nursing notes and vitals signs, including pulse oximetry, reviewed.  Summary of this visit's results, reviewed by myself:  Labs:  Results for orders placed or performed during the hospital encounter of 12/24/14 (from the past 24 hour(s))  Comprehensive metabolic panel     Status: Abnormal   Collection Time: 12/24/14  1:00 AM  Result Value Ref Range   Sodium 132 (L) 135 - 145 mmol/L   Potassium 3.8 3.5 - 5.1 mmol/L   Chloride 98 (L) 101 - 111 mmol/L   CO2 26 22 - 32 mmol/L   Glucose, Bld 519 (H) 65 - 99 mg/dL   BUN 12 6 - 20 mg/dL   Creatinine, Ser 1.61 0.44 - 1.00 mg/dL   Calcium 9.0 8.9 - 09.6 mg/dL   Total Protein 8.0 6.5 - 8.1 g/dL   Albumin 3.3 (L) 3.5 - 5.0 g/dL   AST 045 (H) 15 - 41 U/L   ALT 162 (H) 14 - 54 U/L   Alkaline Phosphatase 242 (H) 38 - 126 U/L   Total Bilirubin 0.7 0.3 - 1.2 mg/dL   GFR calc non Af Amer >60 >60 mL/min   GFR calc Af Amer >60 >60 mL/min   Anion gap 8 5  - 15  Lipase, blood     Status: None   Collection Time: 12/24/14  1:00 AM  Result Value Ref Range   Lipase 48 11 - 51 U/L  CBC with Differential/Platelet     Status: None   Collection Time: 12/24/14  1:00 AM  Result Value Ref Range   WBC 9.7 4.0 - 10.5 K/uL   RBC 4.53 3.87 - 5.11 MIL/uL   Hemoglobin 13.2 12.0 - 15.0 g/dL   HCT 40.9 81.1 - 91.4 %   MCV 88.1 78.0 - 100.0 fL   MCH 29.1 26.0 - 34.0 pg   MCHC 33.1 30.0 - 36.0 g/dL   RDW 78.2 95.6 -  15.5 %   Platelets 230 150 - 400 K/uL   Neutrophils Relative % 50 %   Neutro Abs 5.0 1.7 - 7.7 K/uL   Lymphocytes Relative 39 %   Lymphs Abs 3.7 0.7 - 4.0 K/uL   Monocytes Relative 9 %   Monocytes Absolute 0.8 0.1 - 1.0 K/uL   Eosinophils Relative 1 %   Eosinophils Absolute 0.1 0.0 - 0.7 K/uL   Basophils Relative 1 %   Basophils Absolute 0.1 0.0 - 0.1 K/uL  Urinalysis, Routine w reflex microscopic (not at [facility])     Status: Abnormal   Collection Time: 12/24/14  1:00 AM  Result Value Ref Range   Color, Urine YELLOW YELLOW   APPearance CLEAR CLEAR   Specific Gravity, Urine 1.043 (H) 1.005 - 1.030   pH 6.0 5.0 - 8.0   Glucose, UA >1000 (A) NEGATIVE mg/dL   Hgb urine dipstick NEGATIVE NEGATIVE   Bilirubin Urine NEGATIVE NEGATIVE   Ketones, ur NEGATIVE NEGATIVE mg/dL   Protein, ur NEGATIVE NEGATIVE mg/dL   Nitrite NEGATIVE NEGATIVE   Leukocytes, UA NEGATIVE NEGATIVE  Pregnancy, urine     Status: None   Collection Time: 12/24/14  1:00 AM  Result Value Ref Range   Preg Test, Ur NEGATIVE NEGATIVE  Urine microscopic-add on     Status: Abnormal   Collection Time: 12/24/14  1:00 AM  Result Value Ref Range   Squamous Epithelial / LPF 0-5 (A) NONE SEEN   WBC, UA 0-5 0 - 5 WBC/hpf   RBC / HPF 0-5 0 - 5 RBC/hpf   Bacteria, UA RARE (A) NONE SEEN   Urine-Other MUCOUS PRESENT   CBG monitoring, ED     Status: Abnormal   Collection Time: 12/24/14  2:50 AM  Result Value Ref Range   Glucose-Capillary 363 (H) 65 - 99 mg/dL  CBG monitoring, ED      Status: Abnormal   Collection Time: 12/24/14  3:42 AM  Result Value Ref Range   Glucose-Capillary 354 (H) 65 - 99 mg/dL  CBG monitoring, ED     Status: Abnormal   Collection Time: 12/24/14  4:56 AM  Result Value Ref Range   Glucose-Capillary 287 (H) 65 - 99 mg/dL    Imaging Studies: Ct Abdomen Pelvis W Contrast  12/24/2014  CLINICAL DATA:  Left lower quadrant abdominal pain for 6 days. Nausea, vomiting, heartburn, and constipation. EXAM: CT ABDOMEN AND PELVIS WITH CONTRAST TECHNIQUE: Multidetector CT imaging of the abdomen and pelvis was performed using the standard protocol following bolus administration of intravenous contrast. CONTRAST:  25mL OMNIPAQUE IOHEXOL 300 MG/ML SOLN, OMNIPAQUE IOHEXOL 300 MG/ML SOLN COMPARISON:  10/05/2014 FINDINGS: The lung bases are clear. Are enlarged lateral segment left lobe of liver and prominent caudate lobe suggests hepatic cirrhosis. Spleen size is normal. Cholelithiasis without inflammatory changes in the gallbladder. No bile duct dilatation. Celiac axis and mesenteric lymph nodes are not pathologically enlarged and are likely reactive and may be associated with cirrhosis. Adrenal glands, kidneys, abdominal aorta, inferior vena cava, and retroperitoneal lymph nodes are unremarkable. Stomach, small bowel, and colon are not abnormally distended. No free air or free fluid in the abdomen. Small umbilical hernia containing fat. Pelvis: Appendix is normal. Uterus and ovaries are not enlarged. No free or loculated pelvic fluid collections. No pelvic mass or lymphadenopathy. The no destructive bone lesions. IMPRESSION: Changes of hepatic cirrhosis. Cholelithiasis without inflammatory changes in the gallbladder. No evidence of bowel obstruction or inflammation. Electronically Signed   By: Chrissie Noa  Andria MeuseStevens M.D.   On: 12/24/2014 05:03   5:16 AM Patient tolerating fluids without emesis. Advised to CT findings of cirrhosis consistent with her history of liver  disease.    I personally performed the services described in this documentation, which was scribed in my presence. The recorded information has been reviewed and is accurate.   Paula LibraJohn Emerson Barretto, MD 12/24/14 [phone]

## 2014-12-24 NOTE — ED Notes (Signed)
Pt alert, NAD, calm, interactive, skin W&D, resps e/u, speaking in clear complete sentences, V[facility] re-checked.

## 2014-12-24 NOTE — ED Notes (Signed)
Admits to continued improved abd pain denies other sx, tolerating PO fluids.

## 2014-12-24 NOTE — ED Notes (Signed)
Patient states that she has been throwing up and abdominal pain x all week. The patient reports that she has tried pepto Pharmacist, communitybismal and alka seltzer with no assistance.

## 2014-12-24 NOTE — ED Notes (Signed)
Pt to CT, alert, NAD, calm, interactive.  

## 2015-03-25 ENCOUNTER — Encounter (HOSPITAL_BASED_OUTPATIENT_CLINIC_OR_DEPARTMENT_OTHER): Payer: Self-pay | Admitting: *Deleted

## 2015-03-25 ENCOUNTER — Emergency Department (HOSPITAL_BASED_OUTPATIENT_CLINIC_OR_DEPARTMENT_OTHER): Payer: Medicaid Other

## 2015-03-25 ENCOUNTER — Emergency Department (HOSPITAL_BASED_OUTPATIENT_CLINIC_OR_DEPARTMENT_OTHER)
Admission: EM | Admit: 2015-03-25 | Discharge: 2015-03-25 | Disposition: A | Discharge status: Home / Self Care | Payer: Medicaid Other | Attending: Emergency Medicine | Admitting: Emergency Medicine

## 2015-03-25 DIAGNOSIS — E119 Type 2 diabetes mellitus without complications: Secondary | ICD-10-CM | POA: Diagnosis not present

## 2015-03-25 DIAGNOSIS — R55 Syncope and collapse: Secondary | ICD-10-CM | POA: Diagnosis not present

## 2015-03-25 DIAGNOSIS — R69 Illness, unspecified: Secondary | ICD-10-CM

## 2015-03-25 DIAGNOSIS — Z7951 Long term (current) use of inhaled steroids: Secondary | ICD-10-CM | POA: Insufficient documentation

## 2015-03-25 DIAGNOSIS — Z8719 Personal history of other diseases of the digestive system: Secondary | ICD-10-CM | POA: Insufficient documentation

## 2015-03-25 DIAGNOSIS — E669 Obesity, unspecified: Secondary | ICD-10-CM | POA: Insufficient documentation

## 2015-03-25 DIAGNOSIS — J111 Influenza due to unidentified influenza virus with other respiratory manifestations: Secondary | ICD-10-CM | POA: Insufficient documentation

## 2015-03-25 DIAGNOSIS — Z7984 Long term (current) use of oral hypoglycemic drugs: Secondary | ICD-10-CM | POA: Insufficient documentation

## 2015-03-25 DIAGNOSIS — M545 Low back pain: Secondary | ICD-10-CM | POA: Diagnosis not present

## 2015-03-25 DIAGNOSIS — R112 Nausea with vomiting, unspecified: Secondary | ICD-10-CM | POA: Insufficient documentation

## 2015-03-25 DIAGNOSIS — R51 Headache: Secondary | ICD-10-CM | POA: Diagnosis not present

## 2015-03-25 DIAGNOSIS — Z794 Long term (current) use of insulin: Secondary | ICD-10-CM | POA: Insufficient documentation

## 2015-03-25 DIAGNOSIS — R011 Cardiac murmur, unspecified: Secondary | ICD-10-CM | POA: Insufficient documentation

## 2015-03-25 DIAGNOSIS — Z79899 Other long term (current) drug therapy: Secondary | ICD-10-CM | POA: Diagnosis not present

## 2015-03-25 DIAGNOSIS — J441 Chronic obstructive pulmonary disease with (acute) exacerbation: Secondary | ICD-10-CM | POA: Diagnosis not present

## 2015-03-25 DIAGNOSIS — R000 Tachycardia, unspecified: Secondary | ICD-10-CM | POA: Insufficient documentation

## 2015-03-25 DIAGNOSIS — R509 Fever, unspecified: Secondary | ICD-10-CM | POA: Diagnosis present

## 2015-03-25 LAB — CBC WITH DIFFERENTIAL/PLATELET
BAND NEUTROPHILS: 23 %
BASOS PCT: 0 %
Basophils Absolute: 0 10*3/uL (ref 0.0–0.1)
Eosinophils Absolute: 0 10*3/uL (ref 0.0–0.7)
Eosinophils Relative: 0 %
HCT: 38.2 % (ref 36.0–46.0)
HEMOGLOBIN: 13 g/dL (ref 12.0–15.0)
LYMPHS PCT: 8 %
Lymphs Abs: 0.6 10*3/uL — ABNORMAL LOW (ref 0.7–4.0)
MCH: 29.9 pg (ref 26.0–34.0)
MCHC: 34 g/dL (ref 30.0–36.0)
MCV: 87.8 fL (ref 78.0–100.0)
MONO ABS: 0.6 10*3/uL (ref 0.1–1.0)
MONOS PCT: 8 %
NEUTROS ABS: 6.3 10*3/uL (ref 1.7–7.7)
Neutrophils Relative %: 61 %
Platelets: 179 10*3/uL (ref 150–400)
RBC: 4.35 MIL/uL (ref 3.87–5.11)
RDW: 12.8 % (ref 11.5–15.5)
WBC: 7.5 10*3/uL (ref 4.0–10.5)

## 2015-03-25 LAB — URINALYSIS, ROUTINE W REFLEX MICROSCOPIC
Bilirubin Urine: NEGATIVE
HGB URINE DIPSTICK: NEGATIVE
KETONES UR: NEGATIVE mg/dL
LEUKOCYTES UA: NEGATIVE
Nitrite: NEGATIVE
PH: 6.5 (ref 5.0–8.0)
Protein, ur: 30 mg/dL — AB
Specific Gravity, Urine: >1.046 — ABNORMAL HIGH (ref 1.005–1.030)

## 2015-03-25 LAB — URINE MICROSCOPIC-ADD ON

## 2015-03-25 LAB — COMPREHENSIVE METABOLIC PANEL
ALK PHOS: 144 U/L — AB (ref 38–126)
ALT: 100 U/L — AB (ref 14–54)
AST: 111 U/L — ABNORMAL HIGH (ref 15–41)
Albumin: 3.3 g/dL — ABNORMAL LOW (ref 3.5–5.0)
Anion gap: 8 (ref 5–15)
BUN: 7 mg/dL (ref 6–20)
CALCIUM: 8 mg/dL — AB (ref 8.9–10.3)
CO2: 21 mmol/L — ABNORMAL LOW (ref 22–32)
CREATININE: 0.79 mg/dL (ref 0.44–1.00)
Chloride: 103 mmol/L (ref 101–111)
Glucose, Bld: 334 mg/dL — ABNORMAL HIGH (ref 65–99)
Potassium: 3.9 mmol/L (ref 3.5–5.1)
Sodium: 132 mmol/L — ABNORMAL LOW (ref 135–145)
TOTAL PROTEIN: 7.9 g/dL (ref 6.5–8.1)
Total Bilirubin: 0.6 mg/dL (ref 0.3–1.2)

## 2015-03-25 LAB — CBG MONITORING, ED: Glucose-Capillary: 305 mg/dL — ABNORMAL HIGH (ref 65–99)

## 2015-03-25 MED ORDER — ACETAMINOPHEN 325 MG PO TABS
650.0000 mg | ORAL_TABLET | Freq: Once | ORAL | Status: AC | PRN
  Administered 2015-03-25: 650 mg via ORAL
  Filled 2015-03-25: qty 2

## 2015-03-25 MED ORDER — SODIUM CHLORIDE 0.9 % IV BOLUS (SEPSIS)
1000.0000 mL | Freq: Once | INTRAVENOUS | Status: AC
  Administered 2015-03-25: 1000 mL via INTRAVENOUS

## 2015-03-25 MED ORDER — HYDROCODONE-ACETAMINOPHEN 5-325 MG PO TABS
1.0000 | ORAL_TABLET | Freq: Once | ORAL | Status: AC
  Administered 2015-03-25: 1 via ORAL
  Filled 2015-03-25: qty 1

## 2015-03-25 MED ORDER — KETOROLAC TROMETHAMINE 60 MG/2ML IM SOLN
60.0000 mg | Freq: Once | INTRAMUSCULAR | Status: AC
  Administered 2015-03-25: 60 mg via INTRAMUSCULAR
  Filled 2015-03-25: qty 2

## 2015-03-25 MED ORDER — IBUPROFEN 400 MG PO TABS: 400.0000 mg | ORAL_TABLET | Freq: Three times a day (TID) | ORAL | Status: DC

## 2015-03-25 MED ORDER — ONDANSETRON 4 MG PO TBDP: ORAL_TABLET | ORAL | Status: DC

## 2015-03-25 NOTE — ED Notes (Signed)
Pt reports she passed out in shower approx one hour ago. C/o back and leg pain- temp 103 in triage but pt states she did not realize she had a fever

## 2015-03-25 NOTE — ED Provider Notes (Signed)
CSN: [id number]     Arrival date & time 03/25/15  1929 History  By signing my name below, I, Budd Palmer, attest that this documentation has been prepared under the direction and in the presence of Marily Memos, MD. Electronically Signed: Budd Palmer, ED Scribe. 03/25/2015. 9:11 PM.     Chief Complaint  Patient presents with  . Loss of Consciousness  . Fever   The history is provided by the patient, the spouse and a relative. No language interpreter was used.   HPI Comments: Sheila Gibson is a 45 y.o. female who presents to the Emergency Department complaining of subjective fever and LOC that occurred 2 hours ago. Pt states she was taking a shower when she felt hot and passed out, regaining consciousness when she was being lifted out of the shower. She reports associated headache, nausea, vomiting (1 episode yesterday), cough, SOB, bilateral leg pain, as well as pain radiating form the back down the posterior thighs and into the knees and ankles. She notes she has been drinking ginger ale. She has taken ibuprofen at 2 PM today for the headache without relief. She notes she has received a flu shot this year, but that she had several sick contacts with the flu at home. She reports her blood sugar is typically between 250 and 300, with her most recent measurement at 305 in the ED today.   Past Medical History  Diagnosis Date  . Diabetes mellitus without complication (HCC)   . COPD (chronic obstructive pulmonary disease) (HCC)   . Obesity   . Hypertension   . Heart murmur   . Thyroid disease   . Hepatic cirrhosis [facility])    Past Surgical History  Procedure Laterality Date  . Thyroidectomy    . Cesarean section     No family history on file. Social History  Substance Use Topics  . Smoking status: Never Smoker   . Smokeless tobacco: None  . Alcohol Use: No   OB History    No data available     Review of Systems  Constitutional: Positive for fever.  Respiratory: Positive  for cough and shortness of breath.   Gastrointestinal: Positive for nausea and vomiting.  Musculoskeletal: Positive for myalgias and back pain.  Neurological: Positive for syncope and headaches.  All other systems reviewed and are negative.   Allergies  Review of patient's allergies indicates no known allergies.  Home Medications   Prior to Admission medications   Medication Sig Start Date End Date Taking? Authorizing Provider  albuterol (PROVENTIL HFA;VENTOLIN HFA) 108 (90 BASE) MCG/ACT inhaler Inhale 1-2 puffs into the lungs every 6 (six) hours as needed for wheezing or shortness of breath.   Yes Historical Provider, MD  albuterol (PROVENTIL) (2.5 MG/3ML) 0.083% nebulizer solution Take 2.5 mg by nebulization 2 (two) times daily.   Yes Historical Provider, MD  beclomethasone (QVAR) 80 MCG/ACT inhaler Inhale 2 puffs into the lungs 2 (two) times daily.   Yes Historical Provider, MD  Canagliflozin (INVOKANA) 100 MG TABS Take 100 mg by mouth daily.    Yes Historical Provider, MD  insulin glargine (LANTUS) 100 unit/mL SOPN Inject 60 Units into the skin 2 (two) times daily. 60 units in the morning and 60 units at night   Yes Historical Provider, MD  oxyCODONE-acetaminophen (PERCOCET/ROXICET) 5-325 MG tablet Take by mouth every 4 (four) hours as needed for severe pain.   Yes Historical Provider, MD  sitaGLIPtin (JANUVIA) 100 MG tablet Take 100 mg by mouth daily.  Yes Historical Provider, MD  tiotropium (SPIRIVA) 18 MCG inhalation capsule Place 18 mcg into inhaler and inhale daily as needed (shortness of breath).    Yes Historical Provider, MD  ibuprofen (ADVIL,MOTRIN) 400 MG tablet Take 1 tablet (400 mg total) by mouth 3 (three) times daily. 03/25/15   Marily Memos, MD  metFORMIN (GLUCOPHAGE) 500 MG tablet Take 1 tablet (500 mg total) by mouth 2 (two) times daily with a meal. 08/21/13   Doug Sou, MD  ondansetron (ZOFRAN ODT) 4 MG disintegrating tablet  ODT q4 hours prn nausea/vomit 03/25/15    Marily Memos, MD  oxyCODONE (OXY IR/ROXICODONE) 5 MG immediate release tablet Take 1 tablet (5 mg total) by mouth every 4 (four) hours as needed for severe pain. 12/24/14   John Molpus, MD  solifenacin (VESICARE) 5 MG tablet Take 5 mg by mouth daily.    Historical Provider, MD   BP 126/65 mmHg  Pulse 96  Temp(Src) 99.5 F (37.5 C) (Oral)  Resp 20  Ht  (1.702 m)  Wt 290 lb (131.543 kg)  BMI 45.41 kg/m2  SpO2 87%  LMP 02/21/2015 Physical Exam  Constitutional: She is oriented to person, place, and time. She appears well-developed and well-nourished.  HENT:  Head: Normocephalic and atraumatic.  Dry mucous membranes   Eyes: Conjunctivae are normal. Right eye exhibits no discharge. Left eye exhibits no discharge.  Cardiovascular: Regular rhythm.   Murmur heard. Soft systolic murmur, mildly tachycardic  Pulmonary/Chest: Effort normal and breath sounds normal. No respiratory distress.  Abdominal: Soft. There is no tenderness. There is no rebound and no guarding.  Musculoskeletal:  Bilateral lumbar TTP, no midline TTP, proximal thigh TTP on both sides  Neurological: She is alert and oriented to person, place, and time. Coordination normal.  Skin: Skin is warm and dry. No rash noted. She is not diaphoretic. No erythema.  Psychiatric: She has a normal mood and affect.  Nursing note and vitals reviewed.   ED Course  Procedures  DIAGNOSTIC STUDIES: Oxygen Saturation is 96% on RA, adequate by my interpretation.    COORDINATION OF CARE: 9:06 PM - Discussed plans to order diagnostic studies and imaging. Pt advised of plan for treatment and pt agrees.  Labs Review Labs Reviewed  URINALYSIS, ROUTINE W REFLEX MICROSCOPIC (NOT AT [facility]) - Abnormal; Notable for the following:    Specific Gravity, Urine >1.046 (*)    Glucose, UA >1000 (*)    Protein, ur 30 (*)    All other components within normal limits  CBC WITH DIFFERENTIAL/PLATELET - Abnormal; Notable for the following:    Lymphs  Abs 0.6 (*)    All other components within normal limits  COMPREHENSIVE METABOLIC PANEL - Abnormal; Notable for the following:    Sodium 132 (*)    CO2 21 (*)    Glucose, Bld 334 (*)    Calcium 8.0 (*)    Albumin 3.3 (*)    AST 111 (*)    ALT 100 (*)    Alkaline Phosphatase 144 (*)    All other components within normal limits  INFLUENZA PANEL BY PCR (TYPE A & B, H1N1) - Abnormal; Notable for the following:    Influenza B By PCR POSITIVE (*)    All other components within normal limits  URINE MICROSCOPIC-ADD ON - Abnormal; Notable for the following:    Squamous Epithelial / LPF 0-5 (*)    Bacteria, UA RARE (*)    All other components within normal limits  CBG MONITORING, ED -  Abnormal; Notable for the following:    Glucose-Capillary 305 (*)    All other components within normal limits    Imaging Review No results found. I have personally reviewed and evaluated these images and lab results as part of my medical decision-making.   EKG Interpretation None      MDM   Final diagnoses:  Influenza-like illness    ILI. Considered meningitis but headache was not a main complaint, no rash and no nuchal rigidity. Had myalgias and arthralgias as well. Recently started on amox for sinusitis, so swithced to cefdinir, After antipyretics, fever and HR both improved. This along with not having chest pain all make myocarditis or pericarditis unlikely as cause of fever. Will dc with symptomatic treatment and close PCP follow up.    New Prescriptions: Discharge Medication List as of 03/25/2015 11:18 PM    START taking these medications   Details  ibuprofen (ADVIL,MOTRIN) 400 MG tablet Take 1 tablet (400 mg total) by mouth 3 (three) times daily., Starting 03/25/2015, Until Discontinued, Print         I have personally and contemperaneously reviewed labs and imaging and used in my decision making as above.   A medical screening exam was performed and I feel the patient has had an  appropriate workup for their chief complaint at this time and likelihood of emergent condition existing is low. Their vital signs are stable. They have been counseled on decision, discharge, follow up and which symptoms necessitate immediate return to the emergency department.  They verbally stated understanding and agreement with plan and discharged in stable condition.    I personally performed the services described in this documentation, which was scribed in my presence. The recorded information has been reviewed and is accurate.    Marily MemosJason Marvie Calender, MD 03/28/15 [phone]

## 2015-03-26 LAB — INFLUENZA PANEL BY PCR (TYPE A & B)
H1N1FLUPCR: NOT DETECTED
INFLBPCR: POSITIVE — AB
Influenza A By PCR: NEGATIVE

## 2015-04-19 ENCOUNTER — Encounter (HOSPITAL_BASED_OUTPATIENT_CLINIC_OR_DEPARTMENT_OTHER): Payer: Self-pay

## 2015-04-19 ENCOUNTER — Emergency Department (HOSPITAL_BASED_OUTPATIENT_CLINIC_OR_DEPARTMENT_OTHER)
Admission: EM | Admit: 2015-04-19 | Discharge: 2015-04-19 | Disposition: A | Discharge status: Home / Self Care | Payer: Medicaid Other | Attending: Emergency Medicine | Admitting: Emergency Medicine

## 2015-04-19 ENCOUNTER — Emergency Department (HOSPITAL_BASED_OUTPATIENT_CLINIC_OR_DEPARTMENT_OTHER): Payer: Medicaid Other

## 2015-04-19 DIAGNOSIS — I10 Essential (primary) hypertension: Secondary | ICD-10-CM | POA: Insufficient documentation

## 2015-04-19 DIAGNOSIS — Z7984 Long term (current) use of oral hypoglycemic drugs: Secondary | ICD-10-CM | POA: Insufficient documentation

## 2015-04-19 DIAGNOSIS — Z79899 Other long term (current) drug therapy: Secondary | ICD-10-CM | POA: Insufficient documentation

## 2015-04-19 DIAGNOSIS — Z794 Long term (current) use of insulin: Secondary | ICD-10-CM | POA: Diagnosis not present

## 2015-04-19 DIAGNOSIS — E669 Obesity, unspecified: Secondary | ICD-10-CM | POA: Diagnosis not present

## 2015-04-19 DIAGNOSIS — R1011 Right upper quadrant pain: Secondary | ICD-10-CM | POA: Diagnosis present

## 2015-04-19 DIAGNOSIS — J449 Chronic obstructive pulmonary disease, unspecified: Secondary | ICD-10-CM | POA: Diagnosis not present

## 2015-04-19 DIAGNOSIS — Z3202 Encounter for pregnancy test, result negative: Secondary | ICD-10-CM | POA: Diagnosis not present

## 2015-04-19 DIAGNOSIS — Z7951 Long term (current) use of inhaled steroids: Secondary | ICD-10-CM | POA: Insufficient documentation

## 2015-04-19 DIAGNOSIS — K8020 Calculus of gallbladder without cholecystitis without obstruction: Secondary | ICD-10-CM

## 2015-04-19 DIAGNOSIS — R011 Cardiac murmur, unspecified: Secondary | ICD-10-CM | POA: Insufficient documentation

## 2015-04-19 DIAGNOSIS — E119 Type 2 diabetes mellitus without complications: Secondary | ICD-10-CM | POA: Insufficient documentation

## 2015-04-19 LAB — COMPREHENSIVE METABOLIC PANEL
ALK PHOS: 130 U/L — AB (ref 38–126)
ALT: 57 U/L — AB (ref 14–54)
AST: 49 U/L — ABNORMAL HIGH (ref 15–41)
Albumin: 3.3 g/dL — ABNORMAL LOW (ref 3.5–5.0)
Anion gap: 7 (ref 5–15)
BILIRUBIN TOTAL: 0.5 mg/dL (ref 0.3–1.2)
BUN: 9 mg/dL (ref 6–20)
CALCIUM: 8.4 mg/dL — AB (ref 8.9–10.3)
CO2: 26 mmol/L (ref 22–32)
CREATININE: 0.7 mg/dL (ref 0.44–1.00)
Chloride: 100 mmol/L — ABNORMAL LOW (ref 101–111)
Glucose, Bld: 438 mg/dL — ABNORMAL HIGH (ref 65–99)
Potassium: 4.1 mmol/L (ref 3.5–5.1)
Sodium: 133 mmol/L — ABNORMAL LOW (ref 135–145)
Total Protein: 8 g/dL (ref 6.5–8.1)

## 2015-04-19 LAB — URINE MICROSCOPIC-ADD ON: WBC, UA: NONE SEEN WBC/hpf (ref 0–5)

## 2015-04-19 LAB — URINALYSIS, ROUTINE W REFLEX MICROSCOPIC
BILIRUBIN URINE: NEGATIVE
Glucose, UA: >1000 mg/dL — AB
Hgb urine dipstick: NEGATIVE
Ketones, ur: NEGATIVE mg/dL
Leukocytes, UA: NEGATIVE
NITRITE: NEGATIVE
PH: 6 (ref 5.0–8.0)
Protein, ur: NEGATIVE mg/dL

## 2015-04-19 LAB — CBC WITH DIFFERENTIAL/PLATELET
Basophils Absolute: 0 10*3/uL (ref 0.0–0.1)
Basophils Relative: 0 %
EOS PCT: 1 %
Eosinophils Absolute: 0.1 10*3/uL (ref 0.0–0.7)
HEMATOCRIT: 38.2 % (ref 36.0–46.0)
HEMOGLOBIN: 12.9 g/dL (ref 12.0–15.0)
LYMPHS ABS: 3.3 10*3/uL (ref 0.7–4.0)
LYMPHS PCT: 37 %
MCH: 29.7 pg (ref 26.0–34.0)
MCHC: 33.8 g/dL (ref 30.0–36.0)
MCV: 88 fL (ref 78.0–100.0)
Monocytes Absolute: 0.8 10*3/uL (ref 0.1–1.0)
Monocytes Relative: 9 %
NEUTROS ABS: 4.6 10*3/uL (ref 1.7–7.7)
NEUTROS PCT: 53 %
Platelets: 238 10*3/uL (ref 150–400)
RBC: 4.34 MIL/uL (ref 3.87–5.11)
RDW: 12.8 % (ref 11.5–15.5)
WBC: 8.8 10*3/uL (ref 4.0–10.5)

## 2015-04-19 LAB — PREGNANCY, URINE: Preg Test, Ur: NEGATIVE

## 2015-04-19 LAB — LIPASE, BLOOD: LIPASE: 19 U/L (ref 11–51)

## 2015-04-19 MED ORDER — MORPHINE SULFATE (PF) 4 MG/ML IV SOLN
4.0000 mg | Freq: Once | INTRAVENOUS | Status: AC
  Administered 2015-04-19: 4 mg via INTRAVENOUS
  Filled 2015-04-19: qty 1

## 2015-04-19 MED ORDER — OXYCODONE-ACETAMINOPHEN 5-325 MG PO TABS: 1.0000 | ORAL_TABLET | Freq: Four times a day (QID) | ORAL | Status: DC | PRN

## 2015-04-19 MED ORDER — INSULIN REGULAR HUMAN 100 UNIT/ML IJ SOLN
10.0000 [IU] | Freq: Once | INTRAMUSCULAR | Status: AC
  Administered 2015-04-19: 10 [IU] via SUBCUTANEOUS
  Filled 2015-04-19: qty 1

## 2015-04-19 MED ORDER — SODIUM CHLORIDE 0.9 % IV BOLUS (SEPSIS)
1000.0000 mL | Freq: Once | INTRAVENOUS | Status: AC
  Administered 2015-04-19: 1000 mL via INTRAVENOUS

## 2015-04-19 MED ORDER — ONDANSETRON HCL 4 MG/2ML IJ SOLN
4.0000 mg | Freq: Once | INTRAMUSCULAR | Status: AC
  Administered 2015-04-19: 4 mg via INTRAVENOUS
  Filled 2015-04-19: qty 2

## 2015-04-19 NOTE — ED Notes (Signed)
C/o right side abd pain-started last night-denies n/v/d-NAD-steady gait

## 2015-04-19 NOTE — ED Provider Notes (Signed)
CSN: [id number]     Arrival date & time 04/19/15  1659 History   First MD Initiated Contact with Patient 04/19/15 1812     Chief Complaint  Patient presents with  . Abdominal Pain     (Consider location/radiation/quality/duration/timing/severity/associated sxs/prior Treatment) HPI Comments: Patient with a history of Gallstones presents today with a chief complaint of RUQ abdominal pain.  She states that the pain has been constant since last evening.  Pain radiates to the right mid back.  Pain is worse with eating.  She has not taken anything for her pain prior to arrival.  She reports that the pain feels similar to the pain that she has had with Gallstones in the past.  She has not been previously evaluated by a Gastroenterologist.  She reports associated nausea, but denies vomiting.  She denies fever, chills, cough, chest pain, SOB, or any other symptoms.    The history is provided by the patient.    Past Medical History  Diagnosis Date  . Diabetes mellitus without complication (HCC)   . COPD (chronic obstructive pulmonary disease) (HCC)   . Obesity   . Hypertension   . Heart murmur   . Thyroid disease   . Hepatic cirrhosis [facility])    Past Surgical History  Procedure Laterality Date  . Thyroidectomy    . Cesarean section     No family history on file. Social History  Substance Use Topics  . Smoking status: Never Smoker   . Smokeless tobacco: None  . Alcohol Use: No   OB History    No data available     Review of Systems  All other systems reviewed and are negative.     Allergies  Review of patient's allergies indicates no known allergies.  Home Medications   Prior to Admission medications   Medication Sig Start Date End Date Taking? Authorizing Provider  albuterol (PROVENTIL HFA;VENTOLIN HFA) 108 (90 BASE) MCG/ACT inhaler Inhale 1-2 puffs into the lungs every 6 (six) hours as needed for wheezing or shortness of breath.    Historical Provider, MD  albuterol  (PROVENTIL) (2.5 MG/3ML) 0.083% nebulizer solution Take 2.5 mg by nebulization 2 (two) times daily.    Historical Provider, MD  beclomethasone (QVAR) 80 MCG/ACT inhaler Inhale 2 puffs into the lungs 2 (two) times daily.    Historical Provider, MD  Canagliflozin (INVOKANA) 100 MG TABS Take 100 mg by mouth daily.     Historical Provider, MD  ibuprofen (ADVIL,MOTRIN) 400 MG tablet Take 1 tablet (400 mg total) by mouth 3 (three) times daily. 03/25/15   Marily Memos, MD  insulin glargine (LANTUS) 100 unit/mL SOPN Inject 60 Units into the skin 2 (two) times daily. 60 units in the morning and 60 units at night    Historical Provider, MD  metFORMIN (GLUCOPHAGE) 500 MG tablet Take 1 tablet (500 mg total) by mouth 2 (two) times daily with a meal. 08/21/13   Doug Sou, MD  ondansetron (ZOFRAN ODT) 4 MG disintegrating tablet  ODT q4 hours prn nausea/vomit 03/25/15   Marily Memos, MD  oxyCODONE (OXY IR/ROXICODONE) 5 MG immediate release tablet Take 1 tablet (5 mg total) by mouth every 4 (four) hours as needed for severe pain. 12/24/14   John Molpus, MD  oxyCODONE-acetaminophen (PERCOCET/ROXICET) 5-325 MG tablet Take by mouth every 4 (four) hours as needed for severe pain.    Historical Provider, MD  sitaGLIPtin (JANUVIA) 100 MG tablet Take 100 mg by mouth daily.    Historical Provider, MD  solifenacin (VESICARE) 5 MG tablet Take 5 mg by mouth daily.    Historical Provider, MD  tiotropium (SPIRIVA) 18 MCG inhalation capsule Place 18 mcg into inhaler and inhale daily as needed (shortness of breath).     Historical Provider, MD   BP 125/88 mmHg  Pulse 99  Temp(Src) 98.8 F (37.1 C) (Oral)  Resp 22  Ht 5\' 7"  (1.702 m)  Wt 135.172 kg  BMI 46.66 kg/m2  SpO2 100%  LMP 04/01/2015 Physical Exam  Constitutional: She appears well-developed and well-nourished.  HENT:  Head: Normocephalic and atraumatic.  Mouth/Throat: Oropharynx is clear and moist.  Neck: Normal range of motion. Neck supple.   Cardiovascular: Normal rate, regular rhythm and normal heart sounds.   Pulmonary/Chest: Effort normal and breath sounds normal.  Abdominal: Soft. Bowel sounds are normal. She exhibits no distension and no mass. There is tenderness in the right upper quadrant. There is positive Murphy's sign. There is no rebound and no guarding.  Obese abdomen  Musculoskeletal: Normal range of motion.  Neurological: She is alert.  Skin: Skin is warm and dry.  Psychiatric: She has a normal mood and affect.  Nursing note and vitals reviewed.   ED Course  Procedures (including critical care time) Labs Review Labs Reviewed  URINALYSIS, ROUTINE W REFLEX MICROSCOPIC (NOT AT [facility])  PREGNANCY, URINE  CBC WITH DIFFERENTIAL/PLATELET  COMPREHENSIVE METABOLIC PANEL  LIPASE, BLOOD    Imaging Review [place] Abdomen Limited Ruq  04/19/2015  CLINICAL DATA:  Acute onset of right upper quadrant abdominal pain. Initial encounter. EXAM: US ABDOMEN LIMITED - RIGHT UPPER QUADRANT COMPARISON:  CT of the abdomen and pelvis from 12/24/2014, and right upper quadrant ultrasound performed 08/19/2014 FINDINGS: Gallbladder: Multiple stones are seen dependently within the gallbladder. There is mild gallbladder wall thickening, and trace pericholecystic fluid is suggested. No ultrasonographic Murphy's sign is elicited. Common bile duct: Diameter: 0.4 cm, within normal limits in caliber. Liver: No focal lesion identified. Mildly nodular contour to the liver raises concern for hepatic cirrhosis. Mildly increased parenchymal echogenicity noted. IMPRESSION: 1. Cholelithiasis. Mild gallbladder wall thickening and suggestion of trace pericholecystic fluid, raising question for mild acute cholecystitis, though no ultrasonographic Murphy's sign is elicited. 2. Findings of hepatic cirrhosis. Electronically Signed   By: Roanna RaiderJeffery  Chang M.D.   On: 04/19/2015 21:05   I have personally reviewed and evaluated these images and lab results as part of my  medical decision-making.   EKG Interpretation None     9:33 PM Discussed with Dr. Janee Mornhompson with General Surgery.  He states that if the patients pain is controlled, she can be discharged and follow up in the office. 9:51 PM Patient texting on her cell phone.  She reports pain is controlled at this time. MDM   Final diagnoses:  None   Patient with a history of Gallstones presents today with RUQ abdominal pain that has been present since last evening.   Labs today showing mildly elevated LFT's, but appears to be baseline.  Lipase WNL.  No leukocytosis.  Ultrasound results as above showing Cholelithiasis, but questionable mild cholecystitis.  Patient discussed with Dr. Janee Mornhompson with General Surgery.  He reviewed patient's imaging and labs.  He states that if the pain is controlled she can be discharged home and follow up in the office for outpatient cholecystectomy.  Patient's pain is controlled at the time of discharge.  Stable for discharge.  Strict return precautions given.      Santiago GladHeather Rees Santistevan, PA-C 04/20/15 1800  Melene Planan Floyd, DO  04/23/15 0744 

## 2015-04-19 NOTE — ED Notes (Signed)
PA at bedside.

## 2015-04-27 ENCOUNTER — Emergency Department (HOSPITAL_BASED_OUTPATIENT_CLINIC_OR_DEPARTMENT_OTHER): Payer: Medicaid Other

## 2015-04-27 ENCOUNTER — Emergency Department (HOSPITAL_BASED_OUTPATIENT_CLINIC_OR_DEPARTMENT_OTHER)
Admission: EM | Admit: 2015-04-27 | Discharge: 2015-04-27 | Disposition: A | Discharge status: Home / Self Care | Payer: Medicaid Other | Attending: Emergency Medicine | Admitting: Emergency Medicine

## 2015-04-27 ENCOUNTER — Encounter (HOSPITAL_BASED_OUTPATIENT_CLINIC_OR_DEPARTMENT_OTHER): Payer: Self-pay | Admitting: Emergency Medicine

## 2015-04-27 DIAGNOSIS — E669 Obesity, unspecified: Secondary | ICD-10-CM | POA: Diagnosis not present

## 2015-04-27 DIAGNOSIS — J449 Chronic obstructive pulmonary disease, unspecified: Secondary | ICD-10-CM | POA: Diagnosis not present

## 2015-04-27 DIAGNOSIS — R51 Headache: Secondary | ICD-10-CM | POA: Insufficient documentation

## 2015-04-27 DIAGNOSIS — E119 Type 2 diabetes mellitus without complications: Secondary | ICD-10-CM | POA: Diagnosis not present

## 2015-04-27 DIAGNOSIS — I10 Essential (primary) hypertension: Secondary | ICD-10-CM | POA: Diagnosis not present

## 2015-04-27 DIAGNOSIS — G43109 Migraine with aura, not intractable, without status migrainosus: Secondary | ICD-10-CM

## 2015-04-27 LAB — PREGNANCY, URINE: Preg Test, Ur: NEGATIVE

## 2015-04-27 MED ORDER — METOCLOPRAMIDE HCL 10 MG PO TABS
10.0000 mg | ORAL_TABLET | Freq: Once | ORAL | Status: AC
  Administered 2015-04-27: 10 mg via ORAL
  Filled 2015-04-27: qty 1

## 2015-04-27 MED ORDER — DIVALPROEX SODIUM 250 MG PO DR TAB
500.0000 mg | DELAYED_RELEASE_TABLET | Freq: Two times a day (BID) | ORAL | Status: DC
  Administered 2015-04-27: 500 mg via ORAL
  Filled 2015-04-27: qty 2

## 2015-04-27 MED ORDER — DEXAMETHASONE 6 MG PO TABS
6.0000 mg | ORAL_TABLET | Freq: Once | ORAL | Status: AC
  Administered 2015-04-27: 6 mg via ORAL
  Filled 2015-04-27: qty 1

## 2015-04-27 MED ORDER — DIPHENHYDRAMINE HCL 25 MG PO CAPS
50.0000 mg | ORAL_CAPSULE | Freq: Once | ORAL | Status: AC
  Administered 2015-04-27: 50 mg via ORAL
  Filled 2015-04-27: qty 2

## 2015-04-27 MED ORDER — ONDANSETRON 8 MG PO TBDP
8.0000 mg | ORAL_TABLET | Freq: Once | ORAL | Status: AC
  Administered 2015-04-27: 8 mg via ORAL
  Filled 2015-04-27: qty 1

## 2015-04-27 MED ORDER — GI COCKTAIL ~~LOC~~
30.0000 mL | Freq: Once | ORAL | Status: AC
  Administered 2015-04-27: 30 mL via ORAL
  Filled 2015-04-27: qty 30

## 2015-04-27 NOTE — Discharge Instructions (Signed)

## 2015-04-27 NOTE — ED Notes (Signed)
Patient states that she has a HA that started at about 6 pm. The patient has attempted several medication at home. Patient is laughing and talking with family in room

## 2015-04-27 NOTE — ED Notes (Signed)
C/o ha onset thursday am denies n/v,  Pt laughing and on phone

## 2015-04-27 NOTE — ED Provider Notes (Signed)
CSN: [id number]     Arrival date & time 04/27/15  0017 History   First MD Initiated Contact with Patient 04/27/15 0101     Chief Complaint  Patient presents with  . Headache     (Consider location/radiation/quality/duration/timing/severity/associated sxs/prior Treatment) Patient is a 45 y.o. female presenting with headaches. The history is provided by the patient.  Headache Pain location:  Frontal Quality:  Dull Radiates to:  Does not radiate Onset quality:  Gradual Duration:  1 day Timing:  Constant Progression:  Unchanged Chronicity:  Recurrent Similar to prior headaches: yes   Context: not exposure to cold air   Relieved by:  Nothing Worsened by:  Nothing Ineffective treatments: fiorecet. Associated symptoms: no abdominal pain, no cough, no fever, no focal weakness, no loss of balance, no neck pain, no neck stiffness, no numbness, no seizures, no syncope and no vomiting   Risk factors: no anger     Past Medical History  Diagnosis Date  . Diabetes mellitus without complication (HCC)   . COPD (chronic obstructive pulmonary disease) (HCC)   . Obesity   . Hypertension   . Heart murmur   . Thyroid disease   . Hepatic cirrhosis [facility])    Past Surgical History  Procedure Laterality Date  . Thyroidectomy    . Cesarean section     History reviewed. No pertinent family history. Social History  Substance Use Topics  . Smoking status: Never Smoker   . Smokeless tobacco: None  . Alcohol Use: No   OB History    No data available     Review of Systems  Constitutional: Negative for fever.  Respiratory: Negative for cough and shortness of breath.   Cardiovascular: Negative for chest pain and syncope.  Gastrointestinal: Negative for vomiting and abdominal pain.  Musculoskeletal: Negative for neck pain and neck stiffness.  Neurological: Positive for headaches. Negative for focal weakness, seizures, syncope, facial asymmetry, numbness and loss of balance.  All other  systems reviewed and are negative.     Allergies  Review of patient's allergies indicates no known allergies.  Home Medications   Prior to Admission medications   Medication Sig Start Date End Date Taking? Authorizing Provider  albuterol (PROVENTIL HFA;VENTOLIN HFA) 108 (90 BASE) MCG/ACT inhaler Inhale 1-2 puffs into the lungs every 6 (six) hours as needed for wheezing or shortness of breath.    Historical Provider, MD  albuterol (PROVENTIL) (2.5 MG/3ML) 0.083% nebulizer solution Take 2.5 mg by nebulization 2 (two) times daily.    Historical Provider, MD  beclomethasone (QVAR) 80 MCG/ACT inhaler Inhale 2 puffs into the lungs 2 (two) times daily.    Historical Provider, MD  Canagliflozin (INVOKANA) 100 MG TABS Take 100 mg by mouth daily.     Historical Provider, MD  ibuprofen (ADVIL,MOTRIN) 400 MG tablet Take 1 tablet (400 mg total) by mouth 3 (three) times daily. 03/25/15   Marily Memos, MD  insulin glargine (LANTUS) 100 unit/mL SOPN Inject 60 Units into the skin 2 (two) times daily. 60 units in the morning and 60 units at night    Historical Provider, MD  metFORMIN (GLUCOPHAGE) 500 MG tablet Take 1 tablet (500 mg total) by mouth 2 (two) times daily with a meal. 08/21/13   Doug Sou, MD  ondansetron (ZOFRAN ODT) 4 MG disintegrating tablet  ODT q4 hours prn nausea/vomit 03/25/15   Marily Memos, MD  oxyCODONE (OXY IR/ROXICODONE) 5 MG immediate release tablet Take 1 tablet (5 mg total) by mouth every 4 (four) hours as  needed for severe pain. 12/24/14   John Molpus, MD  oxyCODONE-acetaminophen (PERCOCET/ROXICET) 5-325 MG tablet Take 1-2 tablets by mouth every 6 (six) hours as needed for severe pain. 04/19/15   Heather Laisure, PA-C  sitaGLIPtin (JANUVIA) 100 MG tablet Take 100 mg by mouth daily.    Historical Provider, MD  solifenacin (VESICARE) 5 MG tablet Take 5 mg by mouth daily.    Historical Provider, MD  tiotropium (SPIRIVA) 18 MCG inhalation capsule Place 18 mcg into inhaler and inhale  daily as needed (shortness of breath).     Historical Provider, MD   BP 125/49 mmHg  Pulse 93  Temp(Src) 98.5 F (36.9 C) (Oral)  Resp 18  Ht 5\' 7"  (1.702 m)  Wt 298 lb (135.172 kg)  BMI 46.66 kg/m2  SpO2 100%  LMP 04/01/2015 Physical Exam  Constitutional: She is oriented to person, place, and time. She appears well-developed and well-nourished. No distress.  HENT:  Head: Normocephalic and atraumatic.  Mouth/Throat: Oropharynx is clear and moist. No oropharyngeal exudate.  Eyes: Conjunctivae and EOM are normal. Pupils are equal, round, and reactive to light.  Neck: Normal range of motion. Neck supple.  Cardiovascular: Normal rate, regular rhythm and intact distal pulses.   Pulmonary/Chest: Effort normal and breath sounds normal. No stridor. No respiratory distress. She has no wheezes. She has no rales.  Abdominal: Soft. Bowel sounds are normal. There is no tenderness. There is no rebound.  Musculoskeletal: Normal range of motion.  Lymphadenopathy:    She has no cervical adenopathy.  Neurological: She is alert and oriented to person, place, and time. She has normal reflexes. No cranial nerve deficit.  Skin: Skin is warm and dry.  Psychiatric: She has a normal mood and affect.    ED Course  Procedures (including critical care time) Labs Review Labs Reviewed  PREGNANCY, URINE    Imaging Review Ct Head Wo Contrast  04/27/2015  CLINICAL DATA:  Bifrontal and left retro-orbital pain for 20 hours. No head injury. EXAM: CT HEAD WITHOUT CONTRAST TECHNIQUE: Contiguous axial images were obtained from the base of the skull through the vertex without intravenous contrast. COMPARISON:  None. FINDINGS: Ventricles and sulci appear symmetrical. No ventricular dilatation. No mass effect or midline shift. No abnormal extra-axial fluid collections. Gray-white matter junctions are distinct. Basal cisterns are not effaced. No evidence of acute intracranial hemorrhage. No depressed skull fractures.  Mucosal thickening in the sphenoid sinuses with retention cysts in the left maxillary antrum. Mastoid air cells are not opacified. IMPRESSION: No acute intracranial abnormalities. Electronically Signed   By: Burman NievesWilliam  Stevens M.D.   On: 04/27/2015 00:55   I have personally reviewed and evaluated these images and lab results as part of my medical decision-making.   EKG Interpretation None      MDM   Final diagnoses:  None   Filed Vitals:   04/27/15 0028  BP: 125/49  Pulse: 93  Temp: 98.5 F (36.9 C)  Resp: 18   Results for orders placed or performed during the hospital encounter of 04/27/15  Pregnancy, urine  Result Value Ref Range   Preg Test, Ur NEGATIVE NEGATIVE   Ct Head Wo Contrast  04/27/2015  CLINICAL DATA:  Bifrontal and left retro-orbital pain for 20 hours. No head injury. EXAM: CT HEAD WITHOUT CONTRAST TECHNIQUE: Contiguous axial images were obtained from the base of the skull through the vertex without intravenous contrast. COMPARISON:  None. FINDINGS: Ventricles and sulci appear symmetrical. No ventricular dilatation. No mass effect or midline shift.  No abnormal extra-axial fluid collections. Gray-white matter junctions are distinct. Basal cisterns are not effaced. No evidence of acute intracranial hemorrhage. No depressed skull fractures. Mucosal thickening in the sphenoid sinuses with retention cysts in the left maxillary antrum. Mastoid air cells are not opacified. IMPRESSION: No acute intracranial abnormalities. Electronically Signed   By: Burman Nieves M.D.   On: 04/27/2015 00:55   US Abdomen Limited Ruq  04/19/2015  CLINICAL DATA:  Acute onset of right upper quadrant abdominal pain. Initial encounter. EXAM: US ABDOMEN LIMITED - RIGHT UPPER QUADRANT COMPARISON:  CT of the abdomen and pelvis from 12/24/2014, and right upper quadrant ultrasound performed 08/19/2014 FINDINGS: Gallbladder: Multiple stones are seen dependently within the gallbladder. There is mild  gallbladder wall thickening, and trace pericholecystic fluid is suggested. No ultrasonographic Murphy's sign is elicited. Common bile duct: Diameter: 0.4 cm, within normal limits in caliber. Liver: No focal lesion identified. Mildly nodular contour to the liver raises concern for hepatic cirrhosis. Mildly increased parenchymal echogenicity noted. IMPRESSION: 1. Cholelithiasis. Mild gallbladder wall thickening and suggestion of trace pericholecystic fluid, raising question for mild acute cholecystitis, though no ultrasonographic Murphy's sign is elicited. 2. Findings of hepatic cirrhosis. Electronically Signed   By: Roanna Raider M.D.   On: 04/19/2015 21:05    Medications  metoCLOPramide (REGLAN) tablet 10 mg (10 mg Oral Given 04/27/15 0145)  diphenhydrAMINE (BENADRYL) capsule 50 mg (50 mg Oral Given 04/27/15 0143)  ondansetron (ZOFRAN-ODT) disintegrating tablet 8 mg (8 mg Oral Given 04/27/15 0143)  gi cocktail (Maalox,Lidocaine,Donnatal) (30 mLs Oral Given 04/27/15 0146)  dexamethasone (DECADRON) tablet 6 mg (6 mg Oral Given 04/27/15 0144)    Well appearing.  Consistent with her headaches.  Have treated with migraine cocktail in pill form as she does not want injections.  No signs or symptoms of meningitis or ICH.  Stable for discharge.  Strict return precautions given    Canisha Issac, MD 04/27/15 1610

## 2015-07-21 ENCOUNTER — Emergency Department (HOSPITAL_COMMUNITY)
Admission: EM | Admit: 2015-07-21 | Discharge: 2015-07-21 | Disposition: A | Discharge status: Home / Self Care | Payer: Medicaid Other | Attending: Emergency Medicine | Admitting: Emergency Medicine

## 2015-07-21 ENCOUNTER — Encounter (HOSPITAL_COMMUNITY): Payer: Self-pay

## 2015-07-21 DIAGNOSIS — I10 Essential (primary) hypertension: Secondary | ICD-10-CM | POA: Diagnosis not present

## 2015-07-21 DIAGNOSIS — J449 Chronic obstructive pulmonary disease, unspecified: Secondary | ICD-10-CM | POA: Diagnosis not present

## 2015-07-21 DIAGNOSIS — R011 Cardiac murmur, unspecified: Secondary | ICD-10-CM | POA: Diagnosis not present

## 2015-07-21 DIAGNOSIS — M5440 Lumbago with sciatica, unspecified side: Secondary | ICD-10-CM | POA: Diagnosis not present

## 2015-07-21 DIAGNOSIS — Z7984 Long term (current) use of oral hypoglycemic drugs: Secondary | ICD-10-CM | POA: Insufficient documentation

## 2015-07-21 DIAGNOSIS — Z794 Long term (current) use of insulin: Secondary | ICD-10-CM | POA: Diagnosis not present

## 2015-07-21 DIAGNOSIS — M545 Low back pain: Secondary | ICD-10-CM | POA: Diagnosis present

## 2015-07-21 DIAGNOSIS — E119 Type 2 diabetes mellitus without complications: Secondary | ICD-10-CM | POA: Insufficient documentation

## 2015-07-21 DIAGNOSIS — M5117 Intervertebral disc disorders with radiculopathy, lumbosacral region: Secondary | ICD-10-CM

## 2015-07-21 DIAGNOSIS — M79661 Pain in right lower leg: Secondary | ICD-10-CM | POA: Insufficient documentation

## 2015-07-21 LAB — CBG MONITORING, ED
GLUCOSE-CAPILLARY: 532 mg/dL — AB (ref 65–99)
GLUCOSE-CAPILLARY: 355 mg/dL — AB (ref 65–99)

## 2015-07-21 LAB — COMPREHENSIVE METABOLIC PANEL
ALT: 70 U/L — AB (ref 14–54)
AST: 50 U/L — ABNORMAL HIGH (ref 15–41)
Albumin: 3.1 g/dL — ABNORMAL LOW (ref 3.5–5.0)
Alkaline Phosphatase: 119 U/L (ref 38–126)
Anion gap: 7 (ref 5–15)
BUN: <5 mg/dL — ABNORMAL LOW (ref 6–20)
CALCIUM: 8.6 mg/dL — AB (ref 8.9–10.3)
CHLORIDE: 102 mmol/L (ref 101–111)
CO2: 24 mmol/L (ref 22–32)
CREATININE: 0.72 mg/dL (ref 0.44–1.00)
Glucose, Bld: 464 mg/dL — ABNORMAL HIGH (ref 65–99)
Potassium: 4.1 mmol/L (ref 3.5–5.1)
Sodium: 133 mmol/L — ABNORMAL LOW (ref 135–145)
Total Bilirubin: 0.5 mg/dL (ref 0.3–1.2)
Total Protein: 7.4 g/dL (ref 6.5–8.1)

## 2015-07-21 LAB — CBC
HCT: 39.4 % (ref 36.0–46.0)
Hemoglobin: 12.8 g/dL (ref 12.0–15.0)
MCH: 29 pg (ref 26.0–34.0)
MCHC: 32.5 g/dL (ref 30.0–36.0)
MCV: 89.3 fL (ref 78.0–100.0)
PLATELETS: 221 10*3/uL (ref 150–400)
RBC: 4.41 MIL/uL (ref 3.87–5.11)
RDW: 13 % (ref 11.5–15.5)
WBC: 9 10*3/uL (ref 4.0–10.5)

## 2015-07-21 LAB — URINALYSIS, ROUTINE W REFLEX MICROSCOPIC
BILIRUBIN URINE: NEGATIVE
Glucose, UA: >1000 mg/dL — AB
Hgb urine dipstick: NEGATIVE
KETONES UR: NEGATIVE mg/dL
Leukocytes, UA: NEGATIVE
NITRITE: NEGATIVE
PH: 6.5 (ref 5.0–8.0)
PROTEIN: NEGATIVE mg/dL
Specific Gravity, Urine: 1.034 — ABNORMAL HIGH (ref 1.005–1.030)

## 2015-07-21 LAB — POC URINE PREG, ED: PREG TEST UR: NEGATIVE

## 2015-07-21 LAB — LIPASE, BLOOD: LIPASE: 23 U/L (ref 11–51)

## 2015-07-21 LAB — URINE MICROSCOPIC-ADD ON
RBC / HPF: NONE SEEN RBC/hpf (ref 0–5)
WBC, UA: NONE SEEN WBC/hpf (ref 0–5)

## 2015-07-21 MED ORDER — CYCLOBENZAPRINE HCL 10 MG PO TABS: 10.0000 mg | ORAL_TABLET | Freq: Two times a day (BID) | ORAL | Status: DC | PRN

## 2015-07-21 MED ORDER — HYDROCODONE-ACETAMINOPHEN 5-325 MG PO TABS
2.0000 | ORAL_TABLET | Freq: Once | ORAL | Status: AC
  Administered 2015-07-21: 2 via ORAL
  Filled 2015-07-21: qty 2

## 2015-07-21 MED ORDER — SODIUM CHLORIDE 0.9 % IV BOLUS (SEPSIS)
1000.0000 mL | Freq: Once | INTRAVENOUS | Status: AC
  Administered 2015-07-21: 1000 mL via INTRAVENOUS

## 2015-07-21 MED ORDER — MORPHINE SULFATE (PF) 4 MG/ML IV SOLN
4.0000 mg | Freq: Once | INTRAVENOUS | Status: AC
  Administered 2015-07-21: 4 mg via INTRAVENOUS
  Filled 2015-07-21: qty 1

## 2015-07-21 MED ORDER — DIAZEPAM 5 MG PO TABS
10.0000 mg | ORAL_TABLET | Freq: Once | ORAL | Status: AC
  Administered 2015-07-21: 10 mg via ORAL
  Filled 2015-07-21: qty 2

## 2015-07-21 NOTE — ED Notes (Signed)
Flank pain started about an hour ago with radiating pain down the leg that locked up the leg. Pt. CBG is 544 and pt. Has taken metformin and and invacona this AM and insulin around 1200

## 2015-07-21 NOTE — ED Notes (Signed)
D/C instructions reviewed with patient and her significant other. Pt. Denies any questions and verbalizes understanding of follow up instructions

## 2015-07-21 NOTE — Discharge Instructions (Signed)
You were seen for back pain that had believe is being caused by a pinched nerve in your back. The Flexeril should help relieve tension around her back and improve the pain. Do not drive within 3 hours of taking the Flexeril as it makes people sleepy. See your doctor about your high sugars. It is important to try to keep this under control.  Back Exercises The following exercises strengthen the muscles that help to support the back. They also help to keep the lower back flexible. Doing these exercises can help to prevent back pain or lessen existing pain. If you have back pain or discomfort, try doing these exercises 2-3 times each day or as told by your health care provider. When the pain goes away, do them once each day, but increase the number of times that you repeat the steps for each exercise (do more repetitions). If you do not have back pain or discomfort, do these exercises once each day or as told by your health care provider. EXERCISES Single Knee to Chest Repeat these steps 3-5 times for each leg:  Lie on your back on a firm bed or the floor with your legs extended.  Bring one knee to your chest. Your other leg should stay extended and in contact with the floor.  Hold your knee in place by grabbing your knee or thigh.  Pull on your knee until you feel a gentle stretch in your lower back.  Hold the stretch for 10-30 seconds.  Slowly release and straighten your leg. Pelvic Tilt Repeat these steps 5-10 times:  Lie on your back on a firm bed or the floor with your legs extended.  Bend your knees so they are pointing toward the ceiling and your feet are flat on the floor.  Tighten your lower abdominal muscles to press your lower back against the floor. This motion will tilt your pelvis so your tailbone points up toward the ceiling instead of pointing to your feet or the floor.  With gentle tension and even breathing, hold this position for 5-10 seconds. Cat-Cow Repeat these steps  until your lower back becomes more flexible:  Get into a hands-and-knees position on a firm surface. Keep your hands under your shoulders, and keep your knees under your hips. You may place padding under your knees for comfort.  Let your head hang down, and point your tailbone toward the floor so your lower back becomes rounded like the back of a cat.  Hold this position for 5 seconds.  Slowly lift your head and point your tailbone up toward the ceiling so your back forms a sagging arch like the back of a cow.  Hold this position for 5 seconds. Press-Ups Repeat these steps 5-10 times:  Lie on your abdomen (face-down) on the floor.  Place your palms near your head, about shoulder-width apart.  While you keep your back as relaxed as possible and keep your hips on the floor, slowly straighten your arms to raise the top half of your body and lift your shoulders. Do not use your back muscles to raise your upper torso. You may adjust the placement of your hands to make yourself more comfortable.  Hold this position for 5 seconds while you keep your back relaxed.  Slowly return to lying flat on the floor. Bridges Repeat these steps 10 times:  Lie on your back on a firm surface.  Bend your knees so they are pointing toward the ceiling and your feet are flat on  the floor.  Tighten your buttocks muscles and lift your buttocks off of the floor until your waist is at almost the same height as your knees. You should feel the muscles working in your buttocks and the back of your thighs. If you do not feel these muscles, slide your feet 1-2 inches farther away from your buttocks.  Hold this position for 3-5 seconds.  Slowly lower your hips to the starting position, and allow your buttocks muscles to relax completely. If this exercise is too easy, try doing it with your arms crossed over your chest. Abdominal Crunches Repeat these steps 5-10 times:  Lie on your back on a firm bed or the floor  with your legs extended.  Bend your knees so they are pointing toward the ceiling and your feet are flat on the floor.  Cross your arms over your chest.  Tip your chin slightly toward your chest without bending your neck.  Tighten your abdominal muscles and slowly raise your trunk (torso) high enough to lift your shoulder blades a tiny bit off of the floor. Avoid raising your torso higher than that, because it can put too much stress on your low back and it does not help to strengthen your abdominal muscles.  Slowly return to your starting position. Back Lifts Repeat these steps 5-10 times:  Lie on your abdomen (face-down) with your arms at your sides, and rest your forehead on the floor.  Tighten the muscles in your legs and your buttocks.  Slowly lift your chest off of the floor while you keep your hips pressed to the floor. Keep the back of your head in line with the curve in your back. Your eyes should be looking at the floor.  Hold this position for 3-5 seconds.  Slowly return to your starting position. SEEK MEDICAL CARE IF:  Your back pain or discomfort gets much worse when you do an exercise.  Your back pain or discomfort does not lessen within 2 hours after you exercise. If you have any of these problems, stop doing these exercises right away. Do not do them again unless your health care provider says that you can. SEEK IMMEDIATE MEDICAL CARE IF:  You develop sudden, severe back pain. If this happens, stop doing the exercises right away. Do not do them again unless your health care provider says that you can.   This information is not intended to replace advice given to you by your health care provider. Make sure you discuss any questions you have with your health care provider.   Document Released: 01/31/2004 Document Revised: 09/13/2014 Document Reviewed: 02/16/2014 Elsevier Interactive Patient Education 2016 [employer]  Herniated Disk A herniated disk occurs when  a disk in your spine bulges out too far. This condition is also called a ruptured disk or slipped disk. Your spine (backbone) is made up of bones called vertebrae. Between each pair of vertebrae is an oval disk with a soft, spongy center that acts as a shock absorber when you move. The spongy center is surrounded by a tough outer ring. When you have a herniated disk, the spongy center of the disk bulges out or ruptures through the outer ring. A herniated disk can press on a nerve between your vertebrae and cause pain. A herniated disk can occur anywhere in your back or neck area, but the lower back is the most common spot. CAUSES  In many cases, a herniated disk occurs just from getting older. As you age, the spongy  insides of your disks tend to shrink and dry out. A herniated disk can result from gradual wear and tear. Injury or sudden strain can also cause a herniated disk.  RISK FACTORS Aging is the main risk factor for a herniated disk. Other risk factors include:  Being a man between the ages of 45 and 28 years.  Having a job that requires heavy lifting, bending, or twisting.  Having a job that requires long hours of driving.  Not getting enough exercise.  Being overweight.  Smoking. SIGNS AND SYMPTOMS  Signs and symptoms depend on which disk is herniated.  For a herniated disk in the lower back, you may have sharp pain in:  One part of your leg, hip, or buttocks.  The back of your calf.  The top or sole of your foot (sciatica).   For a herniated disk in the neck, you may feel pain:  When you move your neck.  Near or over your shoulder blade.  That moves to your upper arm, forearm, or fingers.   You may also have muscle weakness. It may be hard to:  Lift your leg or arm.  Stand on your toes.  Squeeze tightly with one of your hands.  Other symptoms can include:  Numbness or tingling in the affected areas of your body.  Loss of bladder or bowel control. This is a  rare but serious sign of a severe herniated disk in the lower back. DIAGNOSIS  Your health care provider will do a physical exam. During this exam, you may have to move certain body parts or assume various positions. For example, your health care provider may do the straight-leg test. This is a good way to test for a herniated disk in your lower back. In this test, the health care provider lifts your leg while you lie on your back. This is to see if you feel pain down your leg. Your health care provider will also check for numbness or loss of feeling.  Your health care provider will also check your:  Reflexes.  Muscle strength.  Posture.  Other tests may be done to help in making a diagnosis. These may include:  An X-ray of the spine to rule out other causes of back pain.   Other imaging studies, such as an MRI or CT scan. This is to check whether the herniated disk is pressing on your spinal canal.  Electromyography (EMG). This test checks the nerves that control muscles. It is sometimes used to identify the specific area of nerve involvement.  TREATMENT  In many cases, herniated disk symptoms go away over a period of days or weeks. You will most likely be free of symptoms in 3-4 months. Treatment may include the following:  The initial treatment for a herniated disk is ashort period of rest.  Bed rest is often limited to 1 or 2 days. Resting for too long delays recovery.  If you have a herniated disk in your lower back, you should avoid sitting as much as possible because sitting increases pressure on the disk.  Medicines. These may include:   Nonsteroidal anti-inflammatory drugs (NSAIDs).  Muscle relaxants for back spasms.  Narcotic pain medicine if your pain is very bad.   Steroid injections. You may need these along the involved nerve root to help control pain. The steroid is injected in the area of the herniated disk. It helps by reducing swelling around the  disk.  Physical therapy. This may include exercises to strengthen the  muscles that help support your spine.   You may need surgery if other treatments do not work.  HOME CARE INSTRUCTIONS Follow all your health care provider's instructions. These may include:  Take all medicines as directed by your health care provider.  Rest for 2 days and then start moving.  Do not sit or stand for long periods of time.  Maintain good posture when sitting and standing.  Avoid movements that cause pain, such as bending or lifting.  When you are able to start lifting things again:  [place] with your knees.  Keep your back straight.  Hold heavy objects close to your body.  If you are overweight, ask your health care provider to help you start a weight-loss program.  When you are able to start exercising, ask your health care provider how much and what type of exercise is best for you.  Work with a physical therapist on stretching and strengthening exercises for your back.  Do not wear high-heeled shoes.  Do not sleep on your belly.  Do not smoke.  Keep all follow-up visits as directed by your health care provider. SEEK MEDICAL CARE IF:  You have back or neck pain that is not getting better after 4 weeks.  You have very bad pain in your back or neck.  You develop numbness, tingling, or weakness along with pain. SEEK IMMEDIATE MEDICAL CARE IF:   You have numbness, tingling, or weakness that makes you unable to use your arms or legs.  You lose control of your bladder or bowels.  You have dizziness or fainting.  You have shortness of breath.  MAKE SURE YOU:   Understand these instructions.  Will watch your condition.  Will get help right away if you are not doing well or get worse.   This information is not intended to replace advice given to you by your health care provider. Make sure you discuss any questions you have with your health care provider.   Document Released:  12/21/1999 Document Revised: 01/13/2014 Document Reviewed: 11/26/2012 Elsevier Interactive Patient Education [employer]

## 2015-07-21 NOTE — ED Provider Notes (Signed)
CSN: [id number]     Arrival date & time    History   First MD Initiated Contact with Patient 07/21/15 1459     Chief Complaint  Patient presents with  . Flank Pain  . Hyperglycemia     (Consider location/radiation/quality/duration/timing/severity/associated sxs/prior Treatment) Patient is a 45 y.o. female presenting with back pain. The history is provided by the patient.  Back Pain Location:  Lumbar spine Quality:  Stabbing, stiffness and shooting Radiates to:  R foot Pain severity:  Severe Onset quality:  Sudden Timing:  Constant Progression:  Unchanged Chronicity:  Recurrent ("never this bad") Context: not recent illness, not recent injury and not twisting   Relieved by:  Being still and bed rest Worsened by:  Ambulation, palpation and twisting Ineffective treatments:  None tried Associated symptoms: abdominal pain, leg pain and tingling (in all extremities)   Associated symptoms: no bladder incontinence, no bowel incontinence, no chest pain, no dysuria, no fever, no headaches, no numbness, no paresthesias, no pelvic pain, no perianal numbness and no weakness   patient states she had a blood sugar over 500 at home this morning. She took 20 units of insulin just prior to the onset of back pain. The pain started just after she laid down earlier this afternoon.  Past Medical History  Diagnosis Date  . Diabetes mellitus without complication (HCC)   . COPD (chronic obstructive pulmonary disease) (HCC)   . Obesity   . Hypertension   . Heart murmur   . Thyroid disease   . Hepatic cirrhosis [facility](HCC)    Past Surgical History  Procedure Laterality Date  . Thyroidectomy    . Cesarean section     No family history on file. Social History  Substance Use Topics  . Smoking status: Never Smoker   . Smokeless tobacco: None  . Alcohol Use: Yes   OB History    No data available     Review of Systems  Constitutional: Negative for fever.  HENT: Negative for congestion.   Eyes:  Negative for visual disturbance.  Respiratory: Negative for cough and shortness of breath.   Cardiovascular: Negative for chest pain and leg swelling.  Gastrointestinal: Positive for abdominal pain. Negative for nausea, vomiting, diarrhea, blood in stool and bowel incontinence.  Genitourinary: Positive for flank pain. Negative for bladder incontinence, dysuria, hematuria and pelvic pain.  Musculoskeletal: Positive for back pain. Negative for neck pain and neck stiffness.  Skin: Negative for rash.  Neurological: Positive for tingling (in all extremities). Negative for weakness, numbness, headaches and paresthesias.  Psychiatric/Behavioral: Negative for confusion and agitation.      Allergies  Review of patient's allergies indicates no known allergies.  Home Medications   Prior to Admission medications   Medication Sig Start Date End Date Taking? Authorizing Provider  albuterol (PROVENTIL HFA;VENTOLIN HFA) 108 (90 BASE) MCG/ACT inhaler Inhale 1-2 puffs into the lungs every 6 (six) hours as needed for wheezing or shortness of breath.    Historical Provider, MD  albuterol (PROVENTIL) (2.5 MG/3ML) 0.083% nebulizer solution Take 2.5 mg by nebulization 2 (two) times daily.    Historical Provider, MD  beclomethasone (QVAR) 80 MCG/ACT inhaler Inhale 2 puffs into the lungs 2 (two) times daily.    Historical Provider, MD  Canagliflozin (INVOKANA) 100 MG TABS Take 100 mg by mouth daily.     Historical Provider, MD  cyclobenzaprine (FLEXERIL) 10 MG tablet Take 1 tablet (10 mg total) by mouth 2 (two) times daily as needed for muscle spasms. 07/21/15  Levora Angel, MD  ibuprofen (ADVIL,MOTRIN) 400 MG tablet Take 1 tablet (400 mg total) by mouth 3 (three) times daily. 03/25/15   Marily Memos, MD  insulin glargine (LANTUS) 100 unit/mL SOPN Inject 60 Units into the skin 2 (two) times daily. 60 units in the morning and 60 units at night    Historical Provider, MD  metFORMIN (GLUCOPHAGE) 500 MG tablet Take 1  tablet (500 mg total) by mouth 2 (two) times daily with a meal. 08/21/13   Doug Sou, MD  ondansetron (ZOFRAN ODT) 4 MG disintegrating tablet 4mg  ODT q4 hours prn nausea/vomit 03/25/15   Marily Memos, MD  oxyCODONE (OXY IR/ROXICODONE) 5 MG immediate release tablet Take 1 tablet (5 mg total) by mouth every 4 (four) hours as needed for severe pain. 12/24/14   John Molpus, MD  oxyCODONE-acetaminophen (PERCOCET/ROXICET) 5-325 MG tablet Take 1-2 tablets by mouth every 6 (six) hours as needed for severe pain. 04/19/15   Heather Laisure, PA-C  sitaGLIPtin (JANUVIA) 100 MG tablet Take 100 mg by mouth daily.    Historical Provider, MD  solifenacin (VESICARE) 5 MG tablet Take 5 mg by mouth daily.    Historical Provider, MD  tiotropium (SPIRIVA) 18 MCG inhalation capsule Place 18 mcg into inhaler and inhale daily as needed (shortness of breath).     Historical Provider, MD   BP 165/107 mmHg  Pulse 95  Temp(Src) 98.8 F (37.1 C) (Oral)  Resp 23  Ht 5\' 7"  (1.702 m)  Wt 135.172 kg  BMI 46.66 kg/m2  SpO2 95%  LMP 07/17/2015 Physical Exam  Constitutional: She is oriented to person, place, and time. She appears well-developed and well-nourished. No distress.  HENT:  Head: Normocephalic and atraumatic.  Eyes: Conjunctivae are normal.  Cardiovascular: Normal rate.   Murmur (3/6 systolic murmur) heard. Pulmonary/Chest: Effort normal and breath sounds normal. She has no wheezes. She has no rales.  Abdominal: Soft. There is no tenderness.  Musculoskeletal: She exhibits no edema.  Lumbar and right paraspinal tenderness. Tenderness to palpation of the right calf. Compartments are soft. No pain with passive range of motion.   Neurological: She is alert and oriented to person, place, and time.  Normal strength in all extremities. Normal sensation in the right leg and foot.  Skin: Skin is warm. No rash noted. She is not diaphoretic.  Psychiatric: She has a normal mood and affect. Her behavior is normal.   Nursing note and vitals reviewed.   ED Course  Procedures (including critical care time) Labs Review Labs Reviewed  URINALYSIS, ROUTINE W REFLEX MICROSCOPIC (NOT AT [facility]) - Abnormal; Notable for the following:    Specific Gravity, Urine 1.034 (*)    Glucose, UA >1000 (*)    All other components within normal limits  COMPREHENSIVE METABOLIC PANEL - Abnormal; Notable for the following:    Sodium 133 (*)    Glucose, Bld 464 (*)    BUN <5 (*)    Calcium 8.6 (*)    Albumin 3.1 (*)    AST 50 (*)    ALT 70 (*)    All other components within normal limits  URINE MICROSCOPIC-ADD ON - Abnormal; Notable for the following:    Squamous Epithelial / LPF 0-5 (*)    Bacteria, UA RARE (*)    All other components within normal limits  CBG MONITORING, ED - Abnormal; Notable for the following:    Glucose-Capillary 532 (*)    All other components within normal limits  CBG MONITORING, ED - Abnormal;  Notable for the following:    Glucose-Capillary 355 (*)    All other components within normal limits  CBC  LIPASE, BLOOD  POC URINE PREG, ED    Imaging Review No results found. I have personally reviewed and evaluated these images and lab results as part of my medical decision-making.   EKG Interpretation   Date/Time:  Saturday July 21 2015 14:55:30 EDT Ventricular Rate:  99 PR Interval:    QRS Duration: 106 QT Interval:  388 QTC Calculation: 498 R Axis:   102 Text Interpretation:  Sinus rhythm Right axis deviation No significant  change since last tracing Confirmed by Denton Lank  MD, Caryn Bee ([id number]) on  07/21/2015 3:24:23 PM      MDM   Final diagnoses:  Sciatic radiculitis   Patient's pain improved with Valium and morphine. Pain likely secondary to radicular pain as it has been waxing and waning over the last month and is exacerbated by specific movements. She is neurovascularly intact on exam. Patient able to ambulate in the emergency department prior to discharge.  Low suspicion for  nephrolithiasis. Exam and laboratory evaluation cause low suspicion for infection.   She was encouraged to follow up with PCP regarding glucose control. Her urinalysis showed high glucose but was otherwise unremarkable. Glucose improved from over 500 on arrival to 355 prior to discharge. Patient was given strict return precautions and was discharged home in good condition.    Levora Angel, MD 07/21/15 1806  Cathren Laine, MD 07/22/15 [phone]

## 2015-07-21 NOTE — ED Notes (Signed)
Placed the patient into a gown and on the monitor

## 2015-11-17 ENCOUNTER — Encounter (HOSPITAL_COMMUNITY): Payer: Self-pay | Admitting: Emergency Medicine

## 2015-11-17 ENCOUNTER — Emergency Department (HOSPITAL_COMMUNITY): Payer: Medicaid Other

## 2015-11-17 ENCOUNTER — Emergency Department (HOSPITAL_COMMUNITY)
Admission: EM | Admit: 2015-11-17 | Discharge: 2015-11-17 | Disposition: A | Discharge status: Home / Self Care | Payer: Medicaid Other | Attending: Emergency Medicine | Admitting: Emergency Medicine

## 2015-11-17 DIAGNOSIS — S0990XA Unspecified injury of head, initial encounter: Secondary | ICD-10-CM | POA: Insufficient documentation

## 2015-11-17 DIAGNOSIS — Y939 Activity, unspecified: Secondary | ICD-10-CM | POA: Diagnosis not present

## 2015-11-17 DIAGNOSIS — J449 Chronic obstructive pulmonary disease, unspecified: Secondary | ICD-10-CM | POA: Diagnosis not present

## 2015-11-17 DIAGNOSIS — Y999 Unspecified external cause status: Secondary | ICD-10-CM | POA: Diagnosis not present

## 2015-11-17 DIAGNOSIS — K8020 Calculus of gallbladder without cholecystitis without obstruction: Secondary | ICD-10-CM | POA: Insufficient documentation

## 2015-11-17 DIAGNOSIS — Y929 Unspecified place or not applicable: Secondary | ICD-10-CM | POA: Diagnosis not present

## 2015-11-17 DIAGNOSIS — W19XXXA Unspecified fall, initial encounter: Secondary | ICD-10-CM

## 2015-11-17 DIAGNOSIS — Z794 Long term (current) use of insulin: Secondary | ICD-10-CM | POA: Diagnosis not present

## 2015-11-17 DIAGNOSIS — R1011 Right upper quadrant pain: Secondary | ICD-10-CM

## 2015-11-17 DIAGNOSIS — I10 Essential (primary) hypertension: Secondary | ICD-10-CM | POA: Diagnosis not present

## 2015-11-17 DIAGNOSIS — W182XXA Fall in (into) shower or empty bathtub, initial encounter: Secondary | ICD-10-CM | POA: Diagnosis not present

## 2015-11-17 DIAGNOSIS — E119 Type 2 diabetes mellitus without complications: Secondary | ICD-10-CM | POA: Insufficient documentation

## 2015-11-17 DIAGNOSIS — Z7984 Long term (current) use of oral hypoglycemic drugs: Secondary | ICD-10-CM | POA: Insufficient documentation

## 2015-11-17 LAB — URINALYSIS, ROUTINE W REFLEX MICROSCOPIC
BILIRUBIN URINE: NEGATIVE
Glucose, UA: >1000 mg/dL — AB
KETONES UR: NEGATIVE mg/dL
Leukocytes, UA: NEGATIVE
Nitrite: NEGATIVE
Protein, ur: NEGATIVE mg/dL
Specific Gravity, Urine: >1.046 — ABNORMAL HIGH (ref 1.005–1.030)
pH: 6 (ref 5.0–8.0)

## 2015-11-17 LAB — CBC WITH DIFFERENTIAL/PLATELET
Basophils Absolute: 0 10*3/uL (ref 0.0–0.1)
Basophils Relative: 0 %
EOS PCT: 1 %
Eosinophils Absolute: 0.1 10*3/uL (ref 0.0–0.7)
HCT: 36.7 % (ref 36.0–46.0)
HEMOGLOBIN: 12.4 g/dL (ref 12.0–15.0)
LYMPHS ABS: 3.7 10*3/uL (ref 0.7–4.0)
LYMPHS PCT: 42 %
MCH: 29.7 pg (ref 26.0–34.0)
MCHC: 33.8 g/dL (ref 30.0–36.0)
MCV: 87.8 fL (ref 78.0–100.0)
MONOS PCT: 7 %
Monocytes Absolute: 0.6 10*3/uL (ref 0.1–1.0)
Neutro Abs: 4.5 10*3/uL (ref 1.7–7.7)
Neutrophils Relative %: 50 %
PLATELETS: 212 10*3/uL (ref 150–400)
RBC: 4.18 MIL/uL (ref 3.87–5.11)
RDW: 13 % (ref 11.5–15.5)
WBC: 8.9 10*3/uL (ref 4.0–10.5)

## 2015-11-17 LAB — COMPREHENSIVE METABOLIC PANEL
ALK PHOS: 145 U/L — AB (ref 38–126)
ALT: 78 U/L — AB (ref 14–54)
AST: 68 U/L — ABNORMAL HIGH (ref 15–41)
Albumin: 3 g/dL — ABNORMAL LOW (ref 3.5–5.0)
Anion gap: 10 (ref 5–15)
BUN: <5 mg/dL — ABNORMAL LOW (ref 6–20)
CALCIUM: 8.8 mg/dL — AB (ref 8.9–10.3)
CO2: 22 mmol/L (ref 22–32)
CREATININE: 0.8 mg/dL (ref 0.44–1.00)
Chloride: 102 mmol/L (ref 101–111)
Glucose, Bld: 414 mg/dL — ABNORMAL HIGH (ref 65–99)
Potassium: 3.6 mmol/L (ref 3.5–5.1)
Sodium: 134 mmol/L — ABNORMAL LOW (ref 135–145)
Total Bilirubin: 0.5 mg/dL (ref 0.3–1.2)
Total Protein: 7 g/dL (ref 6.5–8.1)

## 2015-11-17 LAB — URINE MICROSCOPIC-ADD ON

## 2015-11-17 LAB — LIPASE, BLOOD: LIPASE: 28 U/L (ref 11–51)

## 2015-11-17 LAB — CBG MONITORING, ED: GLUCOSE-CAPILLARY: 312 mg/dL — AB (ref 65–99)

## 2015-11-17 LAB — POC URINE PREG, ED: PREG TEST UR: NEGATIVE

## 2015-11-17 MED ORDER — INSULIN ASPART 100 UNIT/ML ~~LOC~~ SOLN
10.0000 [IU] | Freq: Once | SUBCUTANEOUS | Status: AC
  Administered 2015-11-17: 10 [IU] via SUBCUTANEOUS
  Filled 2015-11-17: qty 1

## 2015-11-17 MED ORDER — FENTANYL CITRATE (PF) 100 MCG/2ML IJ SOLN
100.0000 ug | Freq: Once | INTRAMUSCULAR | Status: AC
  Administered 2015-11-17: 100 ug via INTRAVENOUS
  Filled 2015-11-17: qty 2

## 2015-11-17 MED ORDER — SODIUM CHLORIDE 0.9 % IV BOLUS (SEPSIS)
1000.0000 mL | Freq: Once | INTRAVENOUS | Status: AC
  Administered 2015-11-17: 1000 mL via INTRAVENOUS

## 2015-11-17 MED ORDER — ONDANSETRON HCL 4 MG/2ML IJ SOLN
4.0000 mg | Freq: Once | INTRAMUSCULAR | Status: AC
  Administered 2015-11-17: 4 mg via INTRAVENOUS
  Filled 2015-11-17: qty 2

## 2015-11-17 NOTE — ED Notes (Signed)
c collar removed per Dr. Ladona RidgelGaddy.

## 2015-11-17 NOTE — ED Notes (Signed)
Patient transported to X-ray 

## 2015-11-17 NOTE — ED Triage Notes (Signed)
[facility] Reports pt. Stated she fell in shower about an hour ago and hit her head on tub and is having neck and back pain.  Pain is 10/10. No loss of consciousness. No meds taken PTA. Pt. Started having pain from gallstones this morning. Pt. Is type 2 diabetic. CBG 515; pt. Has not taken night insulin yet. BP 180/96, RR 20, P100.

## 2015-11-17 NOTE — ED Provider Notes (Signed)
[facility]-EMERGENCY DEPT Provider Note   CSN: [id number] Arrival date & time: 11/17/15  1941  History   Chief Complaint Chief Complaint  Patient presents with  . Fall    HPI Sheila Gibson is a 45 y.o. female.   Fall  This is a new problem. The current episode started less than 1 hour ago. The problem occurs constantly. The problem has not changed since onset.Associated symptoms include abdominal pain. Pertinent negatives include no chest pain, no headaches and no shortness of breath.  Abdominal Pain   This is a new problem. The current episode started 6 to 12 hours ago. The problem occurs constantly. The problem has not changed since onset.The pain is associated with suspicious food intake. The pain is located in the RUQ. The pain is at a severity of 6/10. The pain is moderate. Associated symptoms include nausea. Pertinent negatives include diarrhea, vomiting, dysuria, frequency and headaches.    Past Medical History:  Diagnosis Date  . COPD (chronic obstructive pulmonary disease) (HCC)   . Diabetes mellitus without complication (HCC)   . Heart murmur   . Hepatic cirrhosis (HCC)   . Hypertension   . Obesity   . Thyroid disease     There are no active problems to display for this patient.   Past Surgical History:  Procedure Laterality Date  . CESAREAN SECTION    . THYROIDECTOMY      OB History    No data available       Home Medications    Prior to Admission medications   Medication Sig Start Date End Date Taking? Authorizing Provider  albuterol (PROVENTIL HFA;VENTOLIN HFA) 108 (90 BASE) MCG/ACT inhaler Inhale 1-2 puffs into the lungs every 6 (six) hours as needed for wheezing or shortness of breath.    Historical Provider, MD  albuterol (PROVENTIL) (2.5 MG/3ML) 0.083% nebulizer solution Take 2.5 mg by nebulization 2 (two) times daily.    Historical Provider, MD  beclomethasone (QVAR) 80 MCG/ACT inhaler Inhale 2 puffs into the lungs 2 (two) times daily.     Historical Provider, MD  Canagliflozin (INVOKANA) 100 MG TABS Take 100 mg by mouth daily.     Historical Provider, MD  cyclobenzaprine (FLEXERIL) 10 MG tablet Take 1 tablet (10 mg total) by mouth 2 (two) times daily as needed for muscle spasms. 07/21/15   Levora Angel, MD  ibuprofen (ADVIL,MOTRIN) 400 MG tablet Take 1 tablet (400 mg total) by mouth 3 (three) times daily. 03/25/15   Marily Memos, MD  insulin glargine (LANTUS) 100 unit/mL SOPN Inject 60 Units into the skin 2 (two) times daily. 60 units in the morning and 60 units at night    Historical Provider, MD  metFORMIN (GLUCOPHAGE) 500 MG tablet Take 1 tablet (500 mg total) by mouth 2 (two) times daily with a meal. 08/21/13   Doug Sou, MD  ondansetron (ZOFRAN ODT) 4 MG disintegrating tablet 4mg  ODT q4 hours prn nausea/vomit 03/25/15   Marily Memos, MD  oxyCODONE (OXY IR/ROXICODONE) 5 MG immediate release tablet Take 1 tablet (5 mg total) by mouth every 4 (four) hours as needed for severe pain. 12/24/14   John Molpus, MD  oxyCODONE-acetaminophen (PERCOCET/ROXICET) 5-325 MG tablet Take 1-2 tablets by mouth every 6 (six) hours as needed for severe pain. 04/19/15   Heather Laisure, PA-C  sitaGLIPtin (JANUVIA) 100 MG tablet Take 100 mg by mouth daily.    Historical Provider, MD  solifenacin (VESICARE) 5 MG tablet Take 5 mg by mouth daily.    Historical  Provider, MD  tiotropium (SPIRIVA) 18 MCG inhalation capsule Place 18 mcg into inhaler and inhale daily as needed (shortness of breath).     Historical Provider, MD    Family History No family history on file.  Social History Social History  Substance Use Topics  . Smoking status: Never Smoker  . Smokeless tobacco: Not on file  . Alcohol use Yes     Allergies   Patient has no known allergies.   Review of Systems Review of Systems  Respiratory: Negative for shortness of breath and wheezing.   Cardiovascular: Negative for chest pain and palpitations.  Gastrointestinal: Positive for  abdominal pain and nausea. Negative for blood in stool, diarrhea and vomiting.  Genitourinary: Negative for dysuria, flank pain, frequency, vaginal bleeding and vaginal discharge.  Neurological: Negative for seizures, syncope, speech difficulty, weakness, light-headedness and headaches.  All other systems reviewed and are negative.  Physical Exam Updated Vital Signs Ht 5\' 7"  (1.702 m)   Wt 131.5 kg   BMI 45.42 kg/m   Physical Exam  Constitutional: She is oriented to person, place, and time. She appears well-developed and well-nourished. No distress.  HENT:  Head: Normocephalic.  Eyes: Pupils are equal, round, and reactive to light.  Neck: Normal range of motion.  Cardiovascular: Normal rate.   Pulmonary/Chest: Effort normal and breath sounds normal. No respiratory distress. She has no wheezes.  Abdominal: Soft. She exhibits no distension and no mass. There is tenderness. There is no guarding.  RUQ tenderness with positive Murphy's  Musculoskeletal: Normal range of motion. She exhibits no edema.  Neurological: She is alert and oriented to person, place, and time. She displays normal reflexes. No cranial nerve deficit. Coordination normal.  Skin: Skin is warm. Capillary refill takes less than 2 seconds. She is not diaphoretic.  Psychiatric: She has a normal mood and affect. Her behavior is normal. Thought content normal.  Nursing note and vitals reviewed.  ED Treatments / Results  Labs (all labs ordered are listed, but only abnormal results are displayed) Labs Reviewed - No data to display  EKG  EKG Interpretation None       Radiology No results found.  Procedures Procedures (including critical care time)  Medications Ordered in ED Medications - No data to display   Initial Impression / Assessment and Plan / ED Course  I have reviewed the triage vital signs and the nursing notes.  Pertinent labs & imaging results that were available during my care of the patient  were reviewed by me and considered in my medical decision making (see chart for details).  Clinical Course    Patient is a pleasant 45 year old female with past medical history of cholelithiasis and diabetes who presents to emergency department today with fall in her shower secondary to bending down and sleeping.   Patient also states that she was taking a shower to come to the emergency department secondary to right upper quadrant abdominal pain is similar to her previous cholelithiasis. Patient states that she ate some tacos yesterday and since then has been having right upper quadrant abdominal pain, associated with nausea. No other additional GI or GU symptoms.  She is well-appearing. She does have tenderness to right upper quadrant but without rebound or guarding.   Right upper quadrant ultrasound was obtained which showed cholelithiasis but without evidence of cholecystitis. Patient has no desire for surgical intervention at this time. Believe her pain symptomaticcholelithiasis. Advised patient to improve her diet and follow up PCP.   To note,  patient has hyperglycemia without DKA due to not taking her medications. Insulin given here and POCT glucose improved.   Patient has no head or neck trauma. Patient discharged home in good health and with PCP follow up.    Final Clinical Impressions(s) / ED Diagnoses   Final diagnoses:  RUQ pain  Calculus of gallbladder without cholecystitis without obstruction  Fall, initial encounter     Deirdre PeerJeremiah Shakedra Beam, MD 11/17/15 2310    Charlynne Panderavid Hsienta Yao, MD 11/18/15 1601

## 2015-11-17 NOTE — ED Notes (Signed)
Patient transported to CT 

## 2015-11-17 NOTE — ED Notes (Signed)
Pt. Returned from CT.

## 2015-12-04 ENCOUNTER — Emergency Department (HOSPITAL_COMMUNITY)
Admission: EM | Admit: 2015-12-04 | Discharge: 2015-12-05 | Disposition: A | Discharge status: Home / Self Care | Payer: Medicaid Other | Attending: Emergency Medicine | Admitting: Emergency Medicine

## 2015-12-04 ENCOUNTER — Encounter (HOSPITAL_COMMUNITY): Payer: Self-pay | Admitting: Emergency Medicine

## 2015-12-04 DIAGNOSIS — J449 Chronic obstructive pulmonary disease, unspecified: Secondary | ICD-10-CM | POA: Insufficient documentation

## 2015-12-04 DIAGNOSIS — I10 Essential (primary) hypertension: Secondary | ICD-10-CM | POA: Insufficient documentation

## 2015-12-04 DIAGNOSIS — M5431 Sciatica, right side: Secondary | ICD-10-CM | POA: Insufficient documentation

## 2015-12-04 DIAGNOSIS — E119 Type 2 diabetes mellitus without complications: Secondary | ICD-10-CM | POA: Insufficient documentation

## 2015-12-04 DIAGNOSIS — M549 Dorsalgia, unspecified: Secondary | ICD-10-CM | POA: Diagnosis present

## 2015-12-04 DIAGNOSIS — Z794 Long term (current) use of insulin: Secondary | ICD-10-CM | POA: Insufficient documentation

## 2015-12-04 NOTE — ED Provider Notes (Signed)
[facility]-EMERGENCY DEPT Provider Note   CSN: [id number] Arrival date & time: 12/04/15  2142   By signing my name below, I, Nelwyn Salisbury, attest that this documentation has been prepared under the direction and in the presence of Tomasita Crumble, MD . Electronically Signed: Nelwyn Salisbury, Scribe. 12/04/2015. 11:28 PM.  History   Chief Complaint Chief Complaint  Patient presents with  . Back Pain   The history is provided by the patient. No language interpreter was used.    HPI Comments:  Sheila Gibson is a 45 y.o. female with pmhx of DM who presents to the Emergency Department complaining of sudden-onset right sided back pain that began 2 hours ago. She describes her pain as radiating down her right leg exacerbated by certain positions. Pt states that she has had this pain before but it has never radiated down her leg. Pt has tried ibuprofen 6 hours ago and oxycodone 8 hours ago with no relief.  She reports associated tingling in her right leg. Pt denies any dysuria or incontinence.  She denies any neuro symptoms.  Past Medical History:  Diagnosis Date  . COPD (chronic obstructive pulmonary disease) (HCC)   . Diabetes mellitus without complication (HCC)   . Heart murmur   . Hepatic cirrhosis (HCC)   . Hypertension   . Obesity   . Thyroid disease     There are no active problems to display for this patient.   Past Surgical History:  Procedure Laterality Date  . CESAREAN SECTION    . THYROIDECTOMY      OB History    No data available       Home Medications    Prior to Admission medications   Medication Sig Start Date End Date Taking? Authorizing Provider  albuterol (PROVENTIL HFA;VENTOLIN HFA) 108 (90 BASE) MCG/ACT inhaler Inhale 1-2 puffs into the lungs every 6 (six) hours as needed for wheezing or shortness of breath.    Historical Provider, MD  albuterol (PROVENTIL) (2.5 MG/3ML) 0.083% nebulizer solution Take 2.5 mg by nebulization 2 (two) times daily.    Historical  Provider, MD  beclomethasone (QVAR) 80 MCG/ACT inhaler Inhale 2 puffs into the lungs 2 (two) times daily.    Historical Provider, MD  Canagliflozin (INVOKANA) 100 MG TABS Take 100 mg by mouth daily.     Historical Provider, MD  cyclobenzaprine (FLEXERIL) 10 MG tablet Take 1 tablet (10 mg total) by mouth 2 (two) times daily as needed for muscle spasms. 07/21/15   Levora Angel, MD  ibuprofen (ADVIL,MOTRIN) 400 MG tablet Take 1 tablet (400 mg total) by mouth 3 (three) times daily. 03/25/15   Marily Memos, MD  insulin glargine (LANTUS) 100 unit/mL SOPN Inject 60 Units into the skin 2 (two) times daily. 60 units in the morning and 60 units at night    Historical Provider, MD  metFORMIN (GLUCOPHAGE) 500 MG tablet Take 1 tablet (500 mg total) by mouth 2 (two) times daily with a meal. 08/21/13   Doug Sou, MD  ondansetron (ZOFRAN ODT) 4 MG disintegrating tablet 4mg  ODT q4 hours prn nausea/vomit 03/25/15   Marily Memos, MD  oxyCODONE (OXY IR/ROXICODONE) 5 MG immediate release tablet Take 1 tablet (5 mg total) by mouth every 4 (four) hours as needed for severe pain. 12/24/14   John Molpus, MD  oxyCODONE-acetaminophen (PERCOCET/ROXICET) 5-325 MG tablet Take 1-2 tablets by mouth every 6 (six) hours as needed for severe pain. 04/19/15   Heather Laisure, PA-C  sitaGLIPtin (JANUVIA) 100 MG tablet Take 100  mg by mouth daily.    Historical Provider, MD  solifenacin (VESICARE) 5 MG tablet Take 5 mg by mouth daily.    Historical Provider, MD  tiotropium (SPIRIVA) 18 MCG inhalation capsule Place 18 mcg into inhaler and inhale daily as needed (shortness of breath).     Historical Provider, MD    Family History No family history on file.  Social History Social History  Substance Use Topics  . Smoking status: Never Smoker  . Smokeless tobacco: Never Used  . Alcohol use Yes     Comment: occasional drink     Allergies   Patient has no known allergies.   Review of Systems Review of Systems 10 Systems  reviewed and are negative for acute change except as noted in the HPI.   Physical Exam Updated Vital Signs BP 129/99 (BP Location: Left Arm)   Pulse 99   Temp 98.3 F (36.8 C) (Oral)   Resp 20   LMP 11/12/2015 (Exact Date)   SpO2 98%   Physical Exam  Constitutional: She is oriented to person, place, and time. She appears well-developed and well-nourished. No distress.  HENT:  Head: Normocephalic and atraumatic.  Nose: Nose normal.  Mouth/Throat: Oropharynx is clear and moist. No oropharyngeal exudate.  Eyes: Conjunctivae and EOM are normal. Pupils are equal, round, and reactive to light. No scleral icterus.  Neck: Normal range of motion. Neck supple. No JVD present. No tracheal deviation present. No thyromegaly present.  Cardiovascular: Normal rate, regular rhythm and normal heart sounds.  Exam reveals no gallop and no friction rub.   No murmur heard. Pulmonary/Chest: Effort normal and breath sounds normal. No respiratory distress. She has no wheezes. She exhibits no tenderness.  Abdominal: Soft. Bowel sounds are normal. She exhibits no distension and no mass. There is no tenderness. There is no rebound and no guarding.  Musculoskeletal: Normal range of motion. She exhibits no edema or tenderness.  Positive straight leg raise on the R, neg on the L  Lymphadenopathy:    She has no cervical adenopathy.  Neurological: She is alert and oriented to person, place, and time. She has normal strength. No cranial nerve deficit or sensory deficit. She exhibits normal muscle tone.   Normal strength and sensation in all extremities. Normal cerebellar.   Skin: Skin is warm and dry. No rash noted. No erythema. No pallor.  Nursing note and vitals reviewed.    ED Treatments / Results  DIAGNOSTIC STUDIES:  Oxygen Saturation is 98% on Ra, normal by my interpretation.    COORDINATION OF CARE:  11:58 PM Discussed treatment plan with pt at bedside which includes pain medication  and pt agreed to  plan.  Labs (all labs ordered are listed, but only abnormal results are displayed) Labs Reviewed - No data to display  EKG  EKG Interpretation None       Radiology No results found.  Procedures Procedures (including critical care time)  Medications Ordered in ED Medications - No data to display   Initial Impression / Assessment and Plan / ED Course  I have reviewed the triage vital signs and the nursing notes.  Pertinent labs & imaging results that were available during my care of the patient were reviewed by me and considered in my medical decision making (see chart for details).  Clinical Course     Patient presents to the ED for back pain radiating down her leg.  Her history and physical exam is consistent with sciatica.  She was given  lidocaine patch, toradol and oxycodone for relief.  She was educated on sciatica and need for follow up.  She will be given short course of oxycodone to take at home as needed.  She demonstrates good understanding of the plan and need for out pt MRI for further evaluation.  She appears well and in NAD. Vs remain within her normal limits and she is safe for DC.    Final Clinical Impressions(s) / ED Diagnoses   Final diagnoses:  None    New Prescriptions New Prescriptions   No medications on file    I personally performed the services described in this documentation, which was scribed in my presence. The recorded information has been reviewed and is accurate.       Tomasita CrumbleAdeleke Trevion Hoben, MD 12/05/15 [phone]

## 2015-12-04 NOTE — ED Triage Notes (Signed)
Pt from home via EMS with complaints of back pain extending into her right leg. Pt also has complaints of pain in lower right leg secondary to varicose vein. Pt is ambulatory with assistance per ems. Pt states that pain began about 2 hours ago. Pain is sharp in nature. Pt states she took ibuprofen with no relief   Pt has cbg per ems of 488. Pt states she just age.

## 2015-12-05 MED ORDER — KETOROLAC TROMETHAMINE 60 MG/2ML IM SOLN
60.0000 mg | Freq: Once | INTRAMUSCULAR | Status: AC
  Administered 2015-12-05: 60 mg via INTRAMUSCULAR
  Filled 2015-12-05: qty 2

## 2015-12-05 MED ORDER — LIDOCAINE 5 % EX PTCH
1.0000 | MEDICATED_PATCH | Freq: Once | CUTANEOUS | Status: DC
  Administered 2015-12-05: 1 via TRANSDERMAL
  Filled 2015-12-05: qty 1

## 2015-12-05 MED ORDER — OXYCODONE HCL 5 MG PO TABS
10.0000 mg | ORAL_TABLET | Freq: Once | ORAL | Status: AC
Start: 2015-12-05 — End: 2015-12-05
  Administered 2015-12-05: 10 mg via ORAL
  Filled 2015-12-05: qty 2

## 2016-04-13 ENCOUNTER — Encounter (HOSPITAL_COMMUNITY): Payer: Self-pay

## 2016-04-13 DIAGNOSIS — S199XXA Unspecified injury of neck, initial encounter: Secondary | ICD-10-CM | POA: Diagnosis present

## 2016-04-13 DIAGNOSIS — I10 Essential (primary) hypertension: Secondary | ICD-10-CM | POA: Diagnosis not present

## 2016-04-13 DIAGNOSIS — Y929 Unspecified place or not applicable: Secondary | ICD-10-CM | POA: Insufficient documentation

## 2016-04-13 DIAGNOSIS — X58XXXA Exposure to other specified factors, initial encounter: Secondary | ICD-10-CM | POA: Diagnosis not present

## 2016-04-13 DIAGNOSIS — E119 Type 2 diabetes mellitus without complications: Secondary | ICD-10-CM | POA: Insufficient documentation

## 2016-04-13 DIAGNOSIS — J449 Chronic obstructive pulmonary disease, unspecified: Secondary | ICD-10-CM | POA: Diagnosis not present

## 2016-04-13 DIAGNOSIS — S161XXA Strain of muscle, fascia and tendon at neck level, initial encounter: Secondary | ICD-10-CM | POA: Insufficient documentation

## 2016-04-13 DIAGNOSIS — Y939 Activity, unspecified: Secondary | ICD-10-CM | POA: Diagnosis not present

## 2016-04-13 DIAGNOSIS — M5441 Lumbago with sciatica, right side: Secondary | ICD-10-CM | POA: Insufficient documentation

## 2016-04-13 DIAGNOSIS — M79601 Pain in right arm: Secondary | ICD-10-CM | POA: Insufficient documentation

## 2016-04-13 DIAGNOSIS — Y999 Unspecified external cause status: Secondary | ICD-10-CM | POA: Insufficient documentation

## 2016-04-13 DIAGNOSIS — Z79899 Other long term (current) drug therapy: Secondary | ICD-10-CM | POA: Insufficient documentation

## 2016-04-13 DIAGNOSIS — Z794 Long term (current) use of insulin: Secondary | ICD-10-CM | POA: Insufficient documentation

## 2016-04-13 NOTE — ED Triage Notes (Signed)
States pain from right side of her neck down her body to right foot for 2 days had same pain in November 2017, taking ibuprofen for the pain steady gait noted moves all extremities clear speech noted.

## 2016-04-14 ENCOUNTER — Emergency Department (HOSPITAL_COMMUNITY)
Admission: EM | Admit: 2016-04-14 | Discharge: 2016-04-14 | Disposition: A | Discharge status: Home / Self Care | Payer: Medicaid Other | Attending: Emergency Medicine | Admitting: Emergency Medicine

## 2016-04-14 DIAGNOSIS — S46811A Strain of other muscles, fascia and tendons at shoulder and upper arm level, right arm, initial encounter: Secondary | ICD-10-CM

## 2016-04-14 DIAGNOSIS — M792 Neuralgia and neuritis, unspecified: Secondary | ICD-10-CM

## 2016-04-14 DIAGNOSIS — M5441 Lumbago with sciatica, right side: Secondary | ICD-10-CM

## 2016-04-14 MED ORDER — NAPROXEN 500 MG PO TABS: 500.0000 mg | ORAL_TABLET | Freq: Two times a day (BID) | ORAL | 0 refills | Status: DC

## 2016-04-14 MED ORDER — METHOCARBAMOL 500 MG PO TABS: 500.0000 mg | ORAL_TABLET | Freq: Two times a day (BID) | ORAL | 0 refills | Status: DC

## 2016-04-14 MED ORDER — METHOCARBAMOL 500 MG PO TABS
500.0000 mg | ORAL_TABLET | Freq: Once | ORAL | Status: AC
  Administered 2016-04-14: 500 mg via ORAL
  Filled 2016-04-14: qty 1

## 2016-04-14 MED ORDER — OXYCODONE-ACETAMINOPHEN 5-325 MG PO TABS
2.0000 | ORAL_TABLET | Freq: Once | ORAL | Status: AC
  Administered 2016-04-14: 2 via ORAL
  Filled 2016-04-14: qty 2

## 2016-04-14 NOTE — ED Provider Notes (Signed)
[facility]-EMERGENCY DEPT Provider Note   CSN: [id number] Arrival date & time: 04/13/16  2020   By signing my name below, I, Clarisse Gouge, attest that this documentation has been prepared under the direction and in the presence of [facility], PA-C. Electronically Signed: Clarisse Gouge, Scribe. 04/14/16. 1:30 AM.   History   Chief Complaint Chief Complaint  Patient presents with  . Neck Pain   The history is provided by the patient and medical records. No language interpreter was used.    HPI Comments: Sheila Gibson is a 46 y.o. female with Hx of sciatica, thyroidectomy, thyroid disease, DM  and HTN who presents to the Emergency Department complaining of R sided neck pain that started "a couple of days ago". Pt describes 10/10, constant, shooting R neck pain that radiates to the R shoulder, R arm, with additional R lower back pain that radiates into the R buttock and R knee that is worse with certain movements and contact with no reported improving factors. She notes recent increase in repetitive motion. Pt reports 1 episode of similar symptoms treated in 11/2015. Records indicate the pt was seen on 12/04/2015 and diagnosed with sciatica. Pt has reportedly taken Ibuprofen 800 without relief. Pt reportedly right handed. 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 Pt denies recent fall or injury to the neck, abdominal pain, weakness, numbness, fever, IV drug use, saddle anesthesia, loss of bowel or bladder control and Hx of cancer diagnosis.  Past Medical History:  Diagnosis Date  . COPD (chronic obstructive pulmonary disease) (HCC)   . Diabetes mellitus without complication (HCC)   . Heart murmur   . Hepatic cirrhosis (HCC)   . Hypertension   . Obesity   . Thyroid disease     There are no active problems to display for this patient.   Past Surgical History:  Procedure Laterality Date  . CESAREAN SECTION    .  THYROIDECTOMY      OB History    No data available       Home Medications    Prior to Admission medications   Medication Sig Start Date End Date Taking? Authorizing Provider  albuterol (PROVENTIL HFA;VENTOLIN HFA) 108 (90 BASE) MCG/ACT inhaler Inhale 1-2 puffs into the lungs every 6 (six) hours as needed for wheezing or shortness of breath.    Historical Provider, MD  albuterol (PROVENTIL) (2.5 MG/3ML) 0.083% nebulizer solution Take 2.5 mg by nebulization 2 (two) times daily.    Historical Provider, MD  beclomethasone (QVAR) 80 MCG/ACT inhaler Inhale 2 puffs into the lungs 2 (two) times daily.    Historical Provider, MD  Canagliflozin (INVOKANA) 100 MG TABS Take 100 mg by mouth daily.     Historical Provider, MD  cyclobenzaprine (FLEXERIL) 10 MG tablet Take 1 tablet (10 mg total) by mouth 2 (two) times daily as needed for muscle spasms. 07/21/15   Levora Angel, MD  ibuprofen (ADVIL,MOTRIN) 400 MG tablet Take 1 tablet (400 mg total) by mouth 3 (three) times daily. 03/25/15   Marily Memos, MD  insulin glargine (LANTUS) 100 unit/mL SOPN Inject 60 Units into the skin 2 (two) times daily. 60 units in the morning and 60 units at night    Historical Provider, MD  metFORMIN (GLUCOPHAGE) 500 MG tablet Take 1 tablet (500 mg total) by mouth 2 (two) times daily with a meal. 08/21/13   Doug Sou, MD  methocarbamol (ROBAXIN) 500 MG tablet Take 1 tablet (500 mg total) by  mouth 2 (two) times daily. 04/14/16   Junaid Wurzer, PA-C  naproxen (NAPROSYN) 500 MG tablet Take 1 tablet (500 mg total) by mouth 2 (two) times daily with a meal. 04/14/16   Donivin Wirt, PA-C  ondansetron (ZOFRAN ODT) 4 MG disintegrating tablet  ODT q4 hours prn nausea/vomit 03/25/15   Marily Memos, MD  oxyCODONE (OXY IR/ROXICODONE) 5 MG immediate release tablet Take 1 tablet (5 mg total) by mouth every 4 (four) hours as needed for severe pain. 12/24/14   John Molpus, MD  oxyCODONE-acetaminophen (PERCOCET/ROXICET) 5-325 MG  tablet Take 1-2 tablets by mouth every 6 (six) hours as needed for severe pain. 04/19/15   Heather Laisure, PA-C  sitaGLIPtin (JANUVIA) 100 MG tablet Take 100 mg by mouth daily.    Historical Provider, MD  solifenacin (VESICARE) 5 MG tablet Take 5 mg by mouth daily.    Historical Provider, MD  tiotropium (SPIRIVA) 18 MCG inhalation capsule Place 18 mcg into inhaler and inhale daily as needed (shortness of breath).     Historical Provider, MD    Family History History reviewed. No pertinent family history.  Social History Social History  Substance Use Topics  . Smoking status: Never Smoker  . Smokeless tobacco: Never Used  . Alcohol use Yes     Comment: occasional drink     Allergies   Patient has no known allergies.   Review of Systems Review of Systems  Constitutional: Negative for chills and fever.  Gastrointestinal: Negative for abdominal pain.  Musculoskeletal: Positive for arthralgias, back pain, myalgias and neck pain. Negative for gait problem and joint swelling.  Skin: Negative for wound.  Neurological: Negative for weakness and numbness.  All other systems reviewed and are negative.    Physical Exam Updated Vital Signs BP (!) 154/97 (BP Location: Left Arm)   Pulse 90   Temp 98.7 F (37.1 C) (Oral)   Resp 20   SpO2 98%   Physical Exam  Constitutional: She appears well-developed and well-nourished. No distress.  HENT:  Head: Normocephalic and atraumatic.  Mouth/Throat: Oropharynx is clear and moist. No oropharyngeal exudate.  Eyes: Conjunctivae are normal.  Neck: Normal range of motion. Neck supple. Muscular tenderness present. No spinous process tenderness present.  Full ROM without pain Right paraspinal and trapezius pain  Cardiovascular: Normal rate, regular rhythm and intact distal pulses.   Pulmonary/Chest: Effort normal and breath sounds normal. No respiratory distress. She has no wheezes.  Abdominal: Soft. She exhibits no distension. There is no  tenderness.  Musculoskeletal:       Right shoulder: She exhibits pain. She exhibits normal range of motion, no tenderness, no bony tenderness, no swelling, no crepitus, no deformity, no laceration, no spasm and normal strength.       Right elbow: She exhibits normal range of motion, no swelling, no effusion, no deformity and no laceration. No tenderness found.       Right wrist: She exhibits normal range of motion, no tenderness, no bony tenderness, no swelling, no effusion, no deformity and no laceration.       Right hip: She exhibits normal range of motion, normal strength, no tenderness, no bony tenderness, no swelling, no deformity and no laceration.       Right knee: She exhibits normal range of motion, no swelling, no effusion, no ecchymosis, no deformity, no laceration, no erythema and normal patellar mobility. No tenderness found.       Right ankle: She exhibits normal range of motion, no swelling, no ecchymosis, no  deformity and no laceration. No tenderness.  Full range of motion of the T-spine and L-spine No midline tenderness to the  T-spine or L-spine Tenderness to palpation of the right paraspinous muscles of the L-spine and over the SI joint with reproducible pain and radiation of pain FROM of all joints of the RUE and RLE TTP of the right trapezius with reproducible pain in the right arm  Lymphadenopathy:    She has no cervical adenopathy.  Neurological: She is alert.  Speech is clear and goal oriented, follows commands Normal 5/5 strength in upper and lower extremities bilaterally including dorsiflexion and plantar flexion, strong and equal grip strength Sensation normal to light and sharp touch Moves extremities without ataxia, coordination intact Normal gait Normal balance No Clonus  Skin: Skin is warm and dry. No rash noted. She is not diaphoretic. No erythema.  Psychiatric: She has a normal mood and affect. Her behavior is normal.  Nursing note and vitals  reviewed.    ED Treatments / Results  DIAGNOSTIC STUDIES: Oxygen Saturation is 98% on RA, normal by my interpretation.    COORDINATION OF CARE: 12:18 AM Discussed treatment plan with pt at bedside and pt agreed to plan.  Labs (all labs ordered are listed, but only abnormal results are displayed) Labs Reviewed - No data to display  EKG  EKG Interpretation None       Radiology No results found.  Procedures Procedures (including critical care time)  Medications Ordered in ED Medications  methocarbamol (ROBAXIN) tablet 500 mg (500 mg Oral Given 04/14/16 0218)  oxyCODONE-acetaminophen (PERCOCET/ROXICET) 5-325 MG per tablet 2 tablet (2 tablets Oral Given 04/14/16 0218)     Initial Impression / Assessment and Plan / ED Course  I have reviewed the triage vital signs and the nursing notes.  Pertinent labs & imaging results that were available during my care of the patient were reviewed by me and considered in my medical decision making (see chart for details).     Patient with Right trapezius and back pain.  Radicular pain noted on exam. No neurological deficits and normal neuro exam.  Patient can walk but states is painful.  No loss of bowel or bladder control.  No concern for cauda equina.  No fever, night sweats, weight loss, h/o cancer, IVDU.  RICE protocol, also relaxer and anti-inflammatory indicated and discussed with patient. Also discussed importance of close follow-up for further evaluation of her symptoms. No evidence of stroke today. No midline tenderness, doubt cord compression.  She given referral to orthopedics for further evaluation.   Final Clinical Impressions(s) / ED Diagnoses   Final diagnoses:  Acute right-sided low back pain with right-sided sciatica  Trapezius strain, right, initial encounter  Radicular pain in right arm    New Prescriptions Discharge Medication List as of 04/14/2016  2:41 AM    START taking these medications   Details  methocarbamol  (ROBAXIN) 500 MG tablet Take 1 tablet (500 mg total) by mouth 2 (two) times daily., Starting Mon 04/14/2016, Print    naproxen (NAPROSYN) 500 MG tablet Take 1 tablet (500 mg total) by mouth 2 (two) times daily with a meal., Starting Mon 04/14/2016, Print       I personally performed the services described in this documentation, which was scribed in my presence. The recorded information has been reviewed and is accurate.    Dierdre Forth, PA-C 04/14/16 1610    April Palumbo, MD 04/14/16 [phone]

## 2016-04-14 NOTE — Discharge Instructions (Signed)

## 2016-04-28 ENCOUNTER — Emergency Department (HOSPITAL_COMMUNITY): Payer: Medicaid Other

## 2016-04-28 ENCOUNTER — Encounter (HOSPITAL_COMMUNITY): Payer: Self-pay

## 2016-04-28 ENCOUNTER — Emergency Department (HOSPITAL_COMMUNITY)
Admission: EM | Admit: 2016-04-28 | Discharge: 2016-04-29 | Disposition: A | Discharge status: Home / Self Care | Payer: Medicaid Other | Attending: Emergency Medicine | Admitting: Emergency Medicine

## 2016-04-28 DIAGNOSIS — L02416 Cutaneous abscess of left lower limb: Secondary | ICD-10-CM | POA: Diagnosis present

## 2016-04-28 DIAGNOSIS — E1165 Type 2 diabetes mellitus with hyperglycemia: Secondary | ICD-10-CM | POA: Diagnosis not present

## 2016-04-28 DIAGNOSIS — L03317 Cellulitis of buttock: Secondary | ICD-10-CM

## 2016-04-28 DIAGNOSIS — Z794 Long term (current) use of insulin: Secondary | ICD-10-CM | POA: Diagnosis not present

## 2016-04-28 DIAGNOSIS — R739 Hyperglycemia, unspecified: Secondary | ICD-10-CM

## 2016-04-28 DIAGNOSIS — I10 Essential (primary) hypertension: Secondary | ICD-10-CM | POA: Diagnosis not present

## 2016-04-28 DIAGNOSIS — Z79899 Other long term (current) drug therapy: Secondary | ICD-10-CM | POA: Diagnosis not present

## 2016-04-28 DIAGNOSIS — L0231 Cutaneous abscess of buttock: Secondary | ICD-10-CM

## 2016-04-28 DIAGNOSIS — R1010 Upper abdominal pain, unspecified: Secondary | ICD-10-CM

## 2016-04-28 DIAGNOSIS — J449 Chronic obstructive pulmonary disease, unspecified: Secondary | ICD-10-CM | POA: Diagnosis not present

## 2016-04-28 DIAGNOSIS — R1013 Epigastric pain: Secondary | ICD-10-CM | POA: Insufficient documentation

## 2016-04-28 LAB — URINALYSIS, ROUTINE W REFLEX MICROSCOPIC
Bacteria, UA: NONE SEEN
Bilirubin Urine: NEGATIVE
Glucose, UA: >=500 mg/dL — AB
Hgb urine dipstick: NEGATIVE
KETONES UR: NEGATIVE mg/dL
Leukocytes, UA: NEGATIVE
Nitrite: NEGATIVE
PH: 5 (ref 5.0–8.0)
Protein, ur: NEGATIVE mg/dL
RBC / HPF: NONE SEEN RBC/hpf (ref 0–5)
Specific Gravity, Urine: 1.043 — ABNORMAL HIGH (ref 1.005–1.030)

## 2016-04-28 LAB — CBC
HCT: 37.2 % (ref 36.0–46.0)
Hemoglobin: 12.6 g/dL (ref 12.0–15.0)
MCH: 30.7 pg (ref 26.0–34.0)
MCHC: 33.9 g/dL (ref 30.0–36.0)
MCV: 90.7 fL (ref 78.0–100.0)
PLATELETS: 207 10*3/uL (ref 150–400)
RBC: 4.1 MIL/uL (ref 3.87–5.11)
RDW: 13.1 % (ref 11.5–15.5)
WBC: 10.7 10*3/uL — ABNORMAL HIGH (ref 4.0–10.5)

## 2016-04-28 LAB — COMPREHENSIVE METABOLIC PANEL
ALT: 75 U/L — AB (ref 14–54)
AST: 64 U/L — AB (ref 15–41)
Albumin: 2.8 g/dL — ABNORMAL LOW (ref 3.5–5.0)
Alkaline Phosphatase: 166 U/L — ABNORMAL HIGH (ref 38–126)
Anion gap: 6 (ref 5–15)
BUN: 9 mg/dL (ref 6–20)
CHLORIDE: 103 mmol/L (ref 101–111)
CO2: 26 mmol/L (ref 22–32)
CREATININE: 0.73 mg/dL (ref 0.44–1.00)
Calcium: 8.7 mg/dL — ABNORMAL LOW (ref 8.9–10.3)
GFR calc Af Amer: >60 mL/min (ref >60)
GFR calc non Af Amer: >60 mL/min (ref >60)
GLUCOSE: 416 mg/dL — AB (ref 65–99)
Potassium: 4.1 mmol/L (ref 3.5–5.1)
SODIUM: 135 mmol/L (ref 135–145)
Total Bilirubin: 0.7 mg/dL (ref 0.3–1.2)
Total Protein: 8.2 g/dL — ABNORMAL HIGH (ref 6.5–8.1)

## 2016-04-28 LAB — LIPASE, BLOOD: LIPASE: 33 U/L (ref 11–51)

## 2016-04-28 LAB — I-STAT BETA HCG BLOOD, ED (MC, WL, AP ONLY): I-stat hCG, quantitative: <5 m[IU]/mL (ref <5)

## 2016-04-28 MED ORDER — CLINDAMYCIN PHOSPHATE 600 MG/50ML IV SOLN
600.0000 mg | Freq: Once | INTRAVENOUS | Status: AC
  Administered 2016-04-28: 600 mg via INTRAVENOUS
  Filled 2016-04-28: qty 50

## 2016-04-28 MED ORDER — INSULIN ASPART 100 UNIT/ML IV SOLN
10.0000 [IU] | Freq: Once | INTRAVENOUS | Status: AC
  Administered 2016-04-28: 10 [IU] via INTRAVENOUS
  Filled 2016-04-28: qty 0.1

## 2016-04-28 MED ORDER — IOPAMIDOL (ISOVUE-300) INJECTION 61%
INTRAVENOUS | Status: AC
  Filled 2016-04-28: qty 100

## 2016-04-28 MED ORDER — ONDANSETRON 4 MG PO TBDP
4.0000 mg | ORAL_TABLET | Freq: Once | ORAL | Status: AC
  Administered 2016-04-28: 4 mg via ORAL
  Filled 2016-04-28: qty 1

## 2016-04-28 MED ORDER — SODIUM CHLORIDE 0.9 % IV BOLUS (SEPSIS)
1000.0000 mL | Freq: Once | INTRAVENOUS | Status: AC
  Administered 2016-04-28: 1000 mL via INTRAVENOUS

## 2016-04-28 MED ORDER — LIDOCAINE HCL (PF) 1 % IJ SOLN
5.0000 mL | Freq: Once | INTRAMUSCULAR | Status: AC
  Administered 2016-04-28: 5 mL via INTRADERMAL
  Filled 2016-04-28: qty 30

## 2016-04-28 MED ORDER — IOPAMIDOL (ISOVUE-300) INJECTION 61%
100.0000 mL | Freq: Once | INTRAVENOUS | Status: AC | PRN
  Administered 2016-04-28: 100 mL via INTRAVENOUS

## 2016-04-28 MED ORDER — PANTOPRAZOLE SODIUM 40 MG PO TBEC
40.0000 mg | DELAYED_RELEASE_TABLET | Freq: Once | ORAL | Status: AC
  Administered 2016-04-28: 40 mg via ORAL
  Filled 2016-04-28: qty 1

## 2016-04-28 MED ORDER — GI COCKTAIL ~~LOC~~
30.0000 mL | Freq: Once | ORAL | Status: AC
  Administered 2016-04-28: 30 mL via ORAL
  Filled 2016-04-28: qty 30

## 2016-04-28 NOTE — ED Provider Notes (Signed)
[facility]-EMERGENCY DEPT Provider Note   CSN: [id number] Arrival date & time: 04/28/16  1552     History   Chief Complaint Chief Complaint  Patient presents with  . Abdominal Pain  . Back Pain  . Abscess    HPI Sheila Gibson is a 46 y.o. female.  HPI Patient presents with 2 days of redness and swelling to the skin overlying the left hip. Tenderness is been tender to palpation. States it is normally a nodule there that is nontender. She also complains of left lower quadrant abdominal pain over that same period associated with nausea but no fever or chills. States she's been having regular bowel movements. Admits to taking multiple doses of ibuprofen daily. Denies any urinary symptoms, vaginal bleeding or discharge. Past Medical History:  Diagnosis Date  . COPD (chronic obstructive pulmonary disease) (HCC)   . Diabetes mellitus without complication (HCC)   . Heart murmur   . Hepatic cirrhosis (HCC)   . Hypertension   . Obesity   . Thyroid disease     There are no active problems to display for this patient.   Past Surgical History:  Procedure Laterality Date  . CESAREAN SECTION    . THYROIDECTOMY      OB History    No data available       Home Medications    Prior to Admission medications   Medication Sig Start Date End Date Taking? Authorizing Provider  albuterol (PROVENTIL HFA;VENTOLIN HFA) 108 (90 BASE) MCG/ACT inhaler Inhale 1-2 puffs into the lungs every 6 (six) hours as needed for wheezing or shortness of breath.   Yes Historical Provider, MD  beclomethasone (QVAR) 80 MCG/ACT inhaler Inhale 2 puffs into the lungs 2 (two) times daily.   Yes Historical Provider, MD  gabapentin (NEURONTIN) 300 MG capsule Take 600 mg by mouth 3 (three) times daily.    Yes Historical Provider, MD  insulin glargine (LANTUS) 100 unit/mL SOPN Inject 60 Units into the skin 2 (two) times daily. 60 units in the morning and 60 units at night   Yes Historical Provider, MD  tiotropium  (SPIRIVA) 18 MCG inhalation capsule Place 18 mcg into inhaler and inhale daily.    Yes Historical Provider, MD  clindamycin (CLEOCIN) 300 MG capsule Take 1 capsule (300 mg total) by mouth 4 (four) times daily. X 7 days 04/28/16   Loren Racer, MD  cyclobenzaprine (FLEXERIL) 10 MG tablet Take 1 tablet (10 mg total) by mouth 2 (two) times daily as needed for muscle spasms. Patient not taking: Reported on 04/28/2016 07/21/15   Levora Angel, MD  HYDROcodone-acetaminophen [facility]) 5-325 MG tablet Take 1 tablet by mouth every 6 (six) hours as needed for severe pain. 04/28/16   Loren Racer, MD  metFORMIN (GLUCOPHAGE) 500 MG tablet Take 1 tablet (500 mg total) by mouth 2 (two) times daily with a meal. Patient not taking: Reported on 04/28/2016 08/21/13   Doug Sou, MD  methocarbamol (ROBAXIN) 500 MG tablet Take 1 tablet (500 mg total) by mouth 2 (two) times daily. Patient not taking: Reported on 04/28/2016 04/14/16   Dahlia Client Muthersbaugh, PA-C  naproxen (NAPROSYN) 500 MG tablet Take 1 tablet (500 mg total) by mouth 2 (two) times daily with a meal. Patient not taking: Reported on 04/28/2016 04/14/16   Dahlia Client Muthersbaugh, PA-C  ondansetron (ZOFRAN ODT) 4 MG disintegrating tablet  ODT q4 hours prn nausea/vomit Patient not taking: Reported on 04/28/2016 03/25/15   Marily Memos, MD  ondansetron (ZOFRAN) 4 MG tablet Take 1 tablet (  4 mg total) by mouth every 6 (six) hours as needed for nausea or vomiting. 04/28/16   Loren Racer, MD  oxyCODONE (OXY IR/ROXICODONE) 5 MG immediate release tablet Take 1 tablet (5 mg total) by mouth every 4 (four) hours as needed for severe pain. Patient not taking: Reported on 04/28/2016 12/24/14   Paula Libra, MD  oxyCODONE-acetaminophen (PERCOCET/ROXICET) 5-325 MG tablet Take 1-2 tablets by mouth every 6 (six) hours as needed for severe pain. Patient not taking: Reported on 04/28/2016 04/19/15   Santiago Glad, PA-C  pantoprazole (PROTONIX) 20 MG tablet Take 1 tablet (20 mg total)  by mouth 2 (two) times daily. 04/28/16   Loren Racer, MD    Family History History reviewed. No pertinent family history.  Social History Social History  Substance Use Topics  . Smoking status: Never Smoker  . Smokeless tobacco: Never Used  . Alcohol use Yes     Comment: occasional drink     Allergies   Patient has no known allergies.   Review of Systems Review of Systems  Constitutional: Negative for chills, fatigue and fever.  Respiratory: Negative for cough and shortness of breath.   Cardiovascular: Negative for chest pain.  Gastrointestinal: Positive for abdominal pain and nausea. Negative for constipation, diarrhea and vomiting.  Genitourinary: Negative for dysuria, flank pain, frequency, hematuria, vaginal bleeding and vaginal discharge.  Musculoskeletal: Positive for back pain and myalgias. Negative for neck pain and neck stiffness.  Skin: Positive for color change and rash.  Neurological: Negative for dizziness, weakness, light-headedness, numbness and headaches.  All other systems reviewed and are negative.    Physical Exam Updated Vital Signs BP (!) 129/91 (BP Location: Right Arm)   Pulse 67   Temp 98.5 F (36.9 C) (Oral)   Resp 18   LMP 04/13/2016 Comment: hcg-neg  SpO2 98%   Physical Exam  Constitutional: She is oriented to person, place, and time. She appears well-developed and well-nourished. No distress.  HENT:  Head: Normocephalic and atraumatic.  Mouth/Throat: Oropharynx is clear and moist.  Eyes: EOM are normal. Pupils are equal, round, and reactive to light.  Neck: Normal range of motion. Neck supple.  Cardiovascular: Normal rate and regular rhythm.  Exam reveals no gallop and no friction rub.   No murmur heard. Pulmonary/Chest: Effort normal and breath sounds normal. No respiratory distress. She has no wheezes. She has no rales. She exhibits no tenderness.  Abdominal: Soft. Bowel sounds are normal. She exhibits no mass. There is tenderness  (patient is diffusely tender but mostly in the epigastric and left lower quadrants. There is no rebound or guarding.). There is no rebound and no guarding. No hernia.  Musculoskeletal: Normal range of motion. She exhibits tenderness. She exhibits no edema.  Midline lumbar tenderness to palpation. No CVA tenderness bilaterally. No lower extremity swelling or asymmetry. Distal pulses are 2+.  Neurological: She is alert and oriented to person, place, and time.  Skin: Skin is warm and dry. No rash noted. There is erythema.  Patient has small erythematous nodule overlying the surface of the lateral left hip. Tender to palpation. No spontaneous drainage. No fluctuance.  Psychiatric: She has a normal mood and affect. Her behavior is normal.  Nursing note and vitals reviewed.    ED Treatments / Results  Labs (all labs ordered are listed, but only abnormal results are displayed) Labs Reviewed  COMPREHENSIVE METABOLIC PANEL - Abnormal; Notable for the following:       Result Value   Glucose, Bld 416 (*)  Calcium 8.7 (*)    Total Protein 8.2 (*)    Albumin 2.8 (*)    AST 64 (*)    ALT 75 (*)    Alkaline Phosphatase 166 (*)    All other components within normal limits  CBC - Abnormal; Notable for the following:    WBC 10.7 (*)    All other components within normal limits  URINALYSIS, ROUTINE W REFLEX MICROSCOPIC - Abnormal; Notable for the following:    Specific Gravity, Urine 1.043 (*)    Glucose, UA >=500 (*)    Squamous Epithelial / LPF 0-5 (*)    All other components within normal limits  LIPASE, BLOOD  I-STAT BETA HCG BLOOD, ED ([facility], [facility], [facility] ONLY)    EKG  EKG Interpretation None       Radiology Ct Abdomen Pelvis W Contrast  Result Date: 04/28/2016 CLINICAL DATA:  46 y/o F; lower back pain radiating into the lower abdomen with nausea. Possible left hip abscess. EXAM: CT ABDOMEN AND PELVIS WITH CONTRAST TECHNIQUE: Multidetector CT imaging of the abdomen and pelvis was performed  using the standard protocol following bolus administration of intravenous contrast. CONTRAST:  ISOVUE-300 IOPAMIDOL (ISOVUE-300) INJECTION 61% COMPARISON:  12/24/2014 CT of the abdomen and pelvis. FINDINGS: Lower chest: Small clusters of nodules within the lower lobes bilaterally compatible with bronchiolitis. Hepatobiliary: Enlarged left lobe of liver with mild nodularity to contour compatible with cirrhosis. No focal liver lesion identified. Cholelithiasis. No intra or extrahepatic biliary ductal dilatation. Pancreas: Unremarkable. No pancreatic ductal dilatation or surrounding inflammatory changes. Spleen: Normal in size without focal abnormality. Adrenals/Urinary Tract: Adrenal glands are unremarkable. Kidneys are normal, without renal calculi, focal lesion, or hydronephrosis. Bladder is unremarkable. Stomach/Bowel: Stomach is within normal limits. Appendix appears normal. No evidence of bowel wall thickening, distention, or inflammatory changes. Vascular/Lymphatic: No significant vascular findings are present. Prominent portal and gastrohepatic lymph nodes are probably related to cirrhosis. Reproductive: Uterus and bilateral adnexa are unremarkable. Other: No abdominal wall hernia or abnormality. No abdominopelvic ascites. Musculoskeletal: Partially visualized within the left lateral hip subcutaneous fat is an area of inflammatory change. No acute osseous abnormality is evident. Mild lumbar spondylosis. IMPRESSION: 1. Small clusters of nodules within the lower lobes compatible bronchiolitis. 2. Partially visualized left lateral hip subcutaneous fat inflammatory change. This area is near the skin surface and likely amenable to ultrasound evaluation to assess for abscess. 3. Cirrhotic configuration of liver. 4. Cholelithiasis. 5. No additional acute process identified as explanation for abdominal pain. Electronically Signed   By: Mitzi Hansen M.D.   On: 04/28/2016 22:25     Procedures .Marland KitchenIncision and Drainage Date/Time: 04/28/2016 11:54 PM Performed by: Loren Racer Authorized by: Ranae Palms, Dhalia Zingaro   Consent:    Consent obtained:  Verbal Location:    Type:  Abscess   Location:  Lower extremity   Lower extremity location:  Hip   Hip location:  L hip Pre-procedure details:    Skin preparation:  Chloraprep Anesthesia (see MAR for exact dosages):    Anesthesia method:  Local infiltration   Local anesthetic:  Lidocaine 1% w/o epi Procedure type:    Complexity:  Complex Procedure details:    Needle aspiration: no     Incision types:  Stab incision   Incision depth:  Dermal   Scalpel blade:  11   Wound management:  Probed and deloculated   Drainage:  Bloody and purulent   Drainage amount:  Moderate   Wound treatment:  Wound left open  Packing materials:  None Post-procedure details:    Patient tolerance of procedure:  Tolerated well, no immediate complications   (including critical care time)  Medications Ordered in ED Medications  iopamidol (ISOVUE-300) 61 % injection (not administered)  clindamycin (CLEOCIN) IVPB 600 mg (600 mg Intravenous New Bag/Given 04/28/16 2346)  sodium chloride 0.9 % bolus 1,000 mL (1,000 mLs Intravenous New Bag/Given 04/28/16 2346)  gi cocktail (Maalox,Lidocaine,Donnatal) (30 mLs Oral Given 04/28/16 2058)  ondansetron (ZOFRAN-ODT) disintegrating tablet 4 mg (4 mg Oral Given 04/28/16 2058)  pantoprazole (PROTONIX) EC tablet 40 mg (40 mg Oral Given 04/28/16 2058)  iopamidol (ISOVUE-300) 61 % injection 100 mL (100 mLs Intravenous Contrast Given 04/28/16 2132)  insulin aspart (novoLOG) injection 10 Units (10 Units Intravenous Given 04/28/16 2347)  lidocaine (PF) (XYLOCAINE) 1 % injection 5 mL (5 mLs Intradermal Given by Other 04/28/16 2347)     Initial Impression / Assessment and Plan / ED Course  I have reviewed the triage vital signs and the nursing notes.  Pertinent labs & imaging results that were available during  my care of the patient were reviewed by me and considered in my medical decision making (see chart for details).     I&D in the emergency department. Given IV clindamycin and will discharge with the same. We'll need to follow-up with her primary physician. Patient also given IV fluids and insulin for her hyperglycemia. Likely related to abscess/cellulitis. Patient is advised to avoid ibuprofen and other NSAIDs. We'll start on PPI and given medication for nausea. Patient will need follow-up with gastroenterology should her symptoms persist.  Final Clinical Impressions(s) / ED Diagnoses   Final diagnoses:  Abscess and cellulitis of gluteal region  Hyperglycemia  Pain of upper abdomen    New Prescriptions New Prescriptions   CLINDAMYCIN (CLEOCIN) 300 MG CAPSULE    Take 1 capsule (300 mg total) by mouth 4 (four) times daily. X 7 days   HYDROCODONE-ACETAMINOPHEN (NORCO) 5-325 MG TABLET    Take 1 tablet by mouth every 6 (six) hours as needed for severe pain.   ONDANSETRON (ZOFRAN) 4 MG TABLET    Take 1 tablet (4 mg total) by mouth every 6 (six) hours as needed for nausea or vomiting.   PANTOPRAZOLE (PROTONIX) 20 MG TABLET    Take 1 tablet (20 mg total) by mouth 2 (two) times daily.     Loren Racer, MD 04/29/16 Marlyne Beards

## 2016-04-28 NOTE — ED Triage Notes (Signed)
Pt c/o lower back pain radiating into lower abdomen, nausea, and possible L hip abscess x 2 days.  Pain score 10/10.  Pt reports taking ibuprofen and Neurontin w/o relief.  Denies dysuria and vaginal discharge.

## 2016-04-29 MED ORDER — CLINDAMYCIN HCL 300 MG PO CAPS: 300.0000 mg | ORAL_CAPSULE | Freq: Four times a day (QID) | ORAL | 0 refills | Status: DC

## 2016-04-29 MED ORDER — PANTOPRAZOLE SODIUM 20 MG PO TBEC: 20.0000 mg | DELAYED_RELEASE_TABLET | Freq: Two times a day (BID) | ORAL | 0 refills | Status: DC

## 2016-04-29 MED ORDER — ONDANSETRON HCL 4 MG PO TABS
4.0000 mg | ORAL_TABLET | Freq: Four times a day (QID) | ORAL | 0 refills | Status: DC | PRN
Start: 2016-04-28 — End: 2016-11-25

## 2016-04-29 MED ORDER — HYDROCODONE-ACETAMINOPHEN 5-325 MG PO TABS: 1.0000 | ORAL_TABLET | Freq: Four times a day (QID) | ORAL | 0 refills | Status: DC | PRN

## 2016-06-29 ENCOUNTER — Encounter (HOSPITAL_COMMUNITY): Payer: Self-pay | Admitting: Emergency Medicine

## 2016-06-29 ENCOUNTER — Emergency Department (HOSPITAL_COMMUNITY)
Admission: EM | Admit: 2016-06-29 | Discharge: 2016-06-29 | Disposition: A | Discharge status: Home / Self Care | Payer: Medicaid Other | Attending: Emergency Medicine | Admitting: Emergency Medicine

## 2016-06-29 DIAGNOSIS — Z794 Long term (current) use of insulin: Secondary | ICD-10-CM | POA: Diagnosis not present

## 2016-06-29 DIAGNOSIS — H9202 Otalgia, left ear: Secondary | ICD-10-CM | POA: Diagnosis present

## 2016-06-29 DIAGNOSIS — J449 Chronic obstructive pulmonary disease, unspecified: Secondary | ICD-10-CM | POA: Insufficient documentation

## 2016-06-29 DIAGNOSIS — I10 Essential (primary) hypertension: Secondary | ICD-10-CM | POA: Insufficient documentation

## 2016-06-29 DIAGNOSIS — H6092 Unspecified otitis externa, left ear: Secondary | ICD-10-CM | POA: Insufficient documentation

## 2016-06-29 DIAGNOSIS — H60502 Unspecified acute noninfective otitis externa, left ear: Secondary | ICD-10-CM

## 2016-06-29 DIAGNOSIS — E119 Type 2 diabetes mellitus without complications: Secondary | ICD-10-CM | POA: Diagnosis not present

## 2016-06-29 MED ORDER — AMOXICILLIN 500 MG PO CAPS: 500.0000 mg | ORAL_CAPSULE | Freq: Three times a day (TID) | ORAL | 0 refills | Status: DC

## 2016-06-29 MED ORDER — NEOMYCIN-POLYMYXIN-HC 3.5-10000-1 OT SUSP: 4.0000 [drp] | Freq: Four times a day (QID) | OTIC | 0 refills | Status: DC

## 2016-06-29 NOTE — ED Triage Notes (Signed)
Pt reports she began to have slight L ear pain this am. Took some OTC pain medication then took a nap. Once she woke up, the pain became worse and has spread across L side of face from ear. No difficulty hearing.

## 2016-06-29 NOTE — Discharge Instructions (Signed)
Read the information below.  Use the prescribed medication as directed.  Please discuss all new medications with your pharmacist.  You may return to the Emergency Department at any time for worsening condition or any new symptoms that concern you.     If you develop fevers, uncontrolled ear pain, bleeding or discharge from your ear, see your doctor or return for a recheck.    °

## 2016-06-29 NOTE — ED Provider Notes (Signed)
[facility]-EMERGENCY DEPT Provider Note   CSN: [id number] Arrival date & time: 06/29/16  1617  By signing my name below, I, Vista Minkobert Ross, attest that this documentation has been prepared under the direction and in the presence of [facility] LL[facility]ily Yuvraj Pfeifer PA-C.  Electronically Signed: Vista Minkobert Ross, ED Scribe. 06/29/16. 7:48 PM.  History   Chief Complaint Chief Complaint  Patient presents with  . Otalgia    HPI HPI Comments: Sheila Gibson is a 46 y.o. female who presents to the Emergency Department complaining of constant, dull, stabbing left ear pain that started this morning. Pt took OTC Tylenol with no significant relief. She further reports one episode of vomiting today which she states was due to the pain. She has not gone swimming recently. Pt denies any drainage from the ear. She denies any associated URI symptoms including cough, rhinorrhea, congestion, fever, chills.   The history is provided by the patient. No language interpreter was used.    Past Medical History:  Diagnosis Date  . COPD (chronic obstructive pulmonary disease) (HCC)   . Diabetes mellitus without complication (HCC)   . Heart murmur   . Hepatic cirrhosis (HCC)   . Hypertension   . Obesity   . Thyroid disease    There are no active problems to display for this patient.  Past Surgical History:  Procedure Laterality Date  . CESAREAN SECTION    . THYROIDECTOMY     OB History    No data available     Home Medications    Prior to Admission medications   Medication Sig Start Date End Date Taking? Authorizing Provider  albuterol (PROVENTIL HFA;VENTOLIN HFA) 108 (90 BASE) MCG/ACT inhaler Inhale 1-2 puffs into the lungs every 6 (six) hours as needed for wheezing or shortness of breath.    [provider]  amoxicillin (AMOXIL) 500 MG capsule Take 1 capsule (500 mg total) by mouth 3 (three) times daily. 06/29/16   Trixie DredgeWest, Kaili Castille, PA-C  beclomethasone (QVAR) 80 MCG/ACT inhaler Inhale 2 puffs into the lungs 2 (two)  times daily.    [provider]  clindamycin (CLEOCIN) 300 MG capsule Take 1 capsule (300 mg total) by mouth 4 (four) times daily. X 7 days 04/28/16   Loren RacerYelverton, David, MD  cyclobenzaprine (FLEXERIL) 10 MG tablet Take 1 tablet (10 mg total) by mouth 2 (two) times daily as needed for muscle spasms. Patient not taking: Reported on 04/28/2016 07/21/15   Levora AngelEngstrom, Eric, MD  gabapentin (NEURONTIN) 300 MG capsule Take 600 mg by mouth 3 (three) times daily.     [provider]  HYDROcodone-acetaminophen (NORCO) 5-325 MG tablet Take 1 tablet by mouth every 6 (six) hours as needed for severe pain. 04/28/16   Loren RacerYelverton, David, MD  insulin glargine (LANTUS) 100 unit/mL SOPN Inject 60 Units into the skin 2 (two) times daily. 60 units in the morning and 60 units at night    [provider]  metFORMIN (GLUCOPHAGE) 500 MG tablet Take 1 tablet (500 mg total) by mouth 2 (two) times daily with a meal. Patient not taking: Reported on 04/28/2016 08/21/13   Doug SouJacubowitz, Sam, MD  methocarbamol (ROBAXIN) 500 MG tablet Take 1 tablet (500 mg total) by mouth 2 (two) times daily. Patient not taking: Reported on 04/28/2016 04/14/16   Muthersbaugh, Dahlia ClientHannah, PA-C  naproxen (NAPROSYN) 500 MG tablet Take 1 tablet (500 mg total) by mouth 2 (two) times daily with a meal. Patient not taking: Reported on 04/28/2016 04/14/16   Muthersbaugh, Dahlia ClientHannah, PA-C  neomycin-polymyxin-hydrocortisone (CORTISPORIN) 3.5-10000-1  OTIC suspension Place 4 drops into the left ear 4 (four) times daily. 06/29/16   Trixie Dredge, PA-C  ondansetron (ZOFRAN ODT) 4 MG disintegrating tablet 4mg  ODT q4 hours prn nausea/vomit Patient not taking: Reported on 04/28/2016 03/25/15   Mesner, Barbara Cower, MD  ondansetron (ZOFRAN) 4 MG tablet Take 1 tablet (4 mg total) by mouth every 6 (six) hours as needed for nausea or vomiting. 04/28/16   Loren Racer, MD  oxyCODONE (OXY IR/ROXICODONE) 5 MG immediate release tablet Take 1 tablet (5 mg total) by mouth every 4  (four) hours as needed for severe pain. Patient not taking: Reported on 04/28/2016 12/24/14   Molpus, Jonny Ruiz, MD  oxyCODONE-acetaminophen (PERCOCET/ROXICET) 5-325 MG tablet Take 1-2 tablets by mouth every 6 (six) hours as needed for severe pain. Patient not taking: Reported on 04/28/2016 04/19/15   Santiago Glad, PA-C  pantoprazole (PROTONIX) 20 MG tablet Take 1 tablet (20 mg total) by mouth 2 (two) times daily. 04/28/16   Loren Racer, MD  tiotropium (SPIRIVA) 18 MCG inhalation capsule Place 18 mcg into inhaler and inhale daily.     [provider]    Family History History reviewed. No pertinent family history.  Social History Social History  Substance Use Topics  . Smoking status: Never Smoker  . Smokeless tobacco: Never Used  . Alcohol use Yes     Comment: occasional drink     Allergies   Patient has no known allergies.   Review of Systems Review of Systems  Constitutional: Negative for chills and fever.  HENT: Positive for ear pain (left). Negative for congestion, dental problem, ear discharge, hearing loss, mouth sores, rhinorrhea, sore throat and trouble swallowing.   Eyes: Negative for discharge.  Respiratory: Negative for cough, shortness of breath, wheezing and stridor.   Cardiovascular: Negative for chest pain.  Musculoskeletal: Negative for neck pain and neck stiffness.     Physical Exam Updated Vital Signs BP 130/81 (BP Location: Left Arm)   Pulse (!) 101   Temp 98.2 F (36.8 C) (Oral)   Resp 18   SpO2 100%   Physical Exam  Constitutional: She appears well-developed and well-nourished. No distress.  HENT:  Head: Normocephalic and atraumatic.  Mouth/Throat: Oropharynx is clear and moist. No oropharyngeal exudate.  Erythema of L canal and overlying the left TM, no effusion noted. No focal tenderness or bogginess of the mastoid. Pain with pressure on tragus and with traction of Pinna.   Eyes: Conjunctivae are normal.  Neck: Neck supple.    Cardiovascular: Normal rate and regular rhythm.   Pulmonary/Chest: Effort normal and breath sounds normal. No respiratory distress. She has no wheezes. She has no rales.  Neurological: She is alert.  Skin: She is not diaphoretic.  Nursing note and vitals reviewed.    ED Treatments / Results  DIAGNOSTIC STUDIES: Oxygen Saturation is 100% on RA, normal by my interpretation.  COORDINATION OF CARE: 5:48 PM-Discussed treatment plan with pt at bedside and pt agreed to plan.   Labs (all labs ordered are listed, but only abnormal results are displayed) Labs Reviewed - No data to display  EKG  EKG Interpretation None       Radiology No results found.  Procedures Procedures (including critical care time)  Medications Ordered in ED Medications - No data to display   Initial Impression / Assessment and Plan / ED Course  I have reviewed the triage vital signs and the nursing notes.  Pertinent labs & imaging results that were available during my care  of the patient were reviewed by me and considered in my medical decision making (see chart for details).     Afebrile, nontoxic patient with left ear pain.  There is definite otitis externa with erythema only, no discharge.  The TM is also erythematous, doubt effusion but this is difficult to visualize.  Doubt mastoiditis, doubt malignant otitis media.  Given diabetes mellitus, will cover thoroughly.   D/C home with cortisporin otic, amoxicillin, PCP follow up.  Discussed result, findings, treatment, and follow up  with patient.  Pt given return precautions.  Pt verbalizes understanding and agrees with plan.       Final Clinical Impressions(s) / ED Diagnoses   Final diagnoses:  Acute otitis externa of left ear, unspecified type  Left ear pain    New Prescriptions Discharge Medication List as of 06/29/2016  5:49 PM    START taking these medications   Details  amoxicillin (AMOXIL) 500 MG capsule Take 1 capsule (500 mg total) by  mouth 3 (three) times daily., Starting Sun 06/29/2016, Print    neomycin-polymyxin-hydrocortisone (CORTISPORIN) 3.5-10000-1 OTIC suspension Place 4 drops into the left ear 4 (four) times daily., Starting Sun 06/29/2016, Print       I personally performed the services described in this documentation, which was scribed in my presence. The recorded information has been reviewed and is accurate.     Trixie Dredge, [place] 06/29/16 1950    Shaune Pollack, MD 07/01/16 [phone]

## 2016-09-26 ENCOUNTER — Emergency Department (HOSPITAL_COMMUNITY)
Admission: EM | Admit: 2016-09-26 | Discharge: 2016-09-27 | Disposition: A | Discharge status: Home / Self Care | Payer: Medicaid Other | Attending: Emergency Medicine | Admitting: Emergency Medicine

## 2016-09-26 ENCOUNTER — Encounter (HOSPITAL_COMMUNITY): Payer: Self-pay

## 2016-09-26 DIAGNOSIS — E1165 Type 2 diabetes mellitus with hyperglycemia: Secondary | ICD-10-CM | POA: Insufficient documentation

## 2016-09-26 DIAGNOSIS — Z794 Long term (current) use of insulin: Secondary | ICD-10-CM | POA: Insufficient documentation

## 2016-09-26 DIAGNOSIS — J449 Chronic obstructive pulmonary disease, unspecified: Secondary | ICD-10-CM | POA: Insufficient documentation

## 2016-09-26 DIAGNOSIS — R739 Hyperglycemia, unspecified: Secondary | ICD-10-CM

## 2016-09-26 DIAGNOSIS — R197 Diarrhea, unspecified: Secondary | ICD-10-CM | POA: Diagnosis not present

## 2016-09-26 DIAGNOSIS — N3001 Acute cystitis with hematuria: Secondary | ICD-10-CM | POA: Insufficient documentation

## 2016-09-26 DIAGNOSIS — R1030 Lower abdominal pain, unspecified: Secondary | ICD-10-CM | POA: Diagnosis present

## 2016-09-26 LAB — URINALYSIS, ROUTINE W REFLEX MICROSCOPIC
BILIRUBIN URINE: NEGATIVE
KETONES UR: NEGATIVE mg/dL
NITRITE: NEGATIVE
PH: 6 (ref 5.0–8.0)
PROTEIN: NEGATIVE mg/dL
Specific Gravity, Urine: 1.036 — ABNORMAL HIGH (ref 1.005–1.030)

## 2016-09-26 LAB — COMPREHENSIVE METABOLIC PANEL
ALK PHOS: 173 U/L — AB (ref 38–126)
ALT: 58 U/L — ABNORMAL HIGH (ref 14–54)
ANION GAP: 8 (ref 5–15)
AST: 53 U/L — ABNORMAL HIGH (ref 15–41)
Albumin: 3 g/dL — ABNORMAL LOW (ref 3.5–5.0)
BILIRUBIN TOTAL: 1 mg/dL (ref 0.3–1.2)
BUN: 9 mg/dL (ref 6–20)
CALCIUM: 8.4 mg/dL — AB (ref 8.9–10.3)
CO2: 26 mmol/L (ref 22–32)
Chloride: 99 mmol/L — ABNORMAL LOW (ref 101–111)
Creatinine, Ser: 0.81 mg/dL (ref 0.44–1.00)
GFR calc non Af Amer: >60 mL/min (ref >60)
Glucose, Bld: 452 mg/dL — ABNORMAL HIGH (ref 65–99)
Potassium: 3.8 mmol/L (ref 3.5–5.1)
SODIUM: 133 mmol/L — AB (ref 135–145)
TOTAL PROTEIN: 8.9 g/dL — AB (ref 6.5–8.1)

## 2016-09-26 LAB — CBC
HCT: 34.4 % — ABNORMAL LOW (ref 36.0–46.0)
HEMOGLOBIN: 11.5 g/dL — AB (ref 12.0–15.0)
MCH: 30.4 pg (ref 26.0–34.0)
MCHC: 33.4 g/dL (ref 30.0–36.0)
MCV: 91 fL (ref 78.0–100.0)
Platelets: 179 10*3/uL (ref 150–400)
RBC: 3.78 MIL/uL — AB (ref 3.87–5.11)
RDW: 13.5 % (ref 11.5–15.5)
WBC: 9.7 10*3/uL (ref 4.0–10.5)

## 2016-09-26 LAB — I-STAT BETA HCG BLOOD, ED (MC, WL, AP ONLY): I-stat hCG, quantitative: <5 m[IU]/mL (ref <5)

## 2016-09-26 LAB — CBG MONITORING, ED: Glucose-Capillary: 294 mg/dL — ABNORMAL HIGH (ref 65–99)

## 2016-09-26 LAB — LIPASE, BLOOD: Lipase: 32 U/L (ref 11–51)

## 2016-09-26 MED ORDER — SODIUM CHLORIDE 0.9 % IV BOLUS (SEPSIS)
1000.0000 mL | Freq: Once | INTRAVENOUS | Status: AC
  Administered 2016-09-26: 1000 mL via INTRAVENOUS

## 2016-09-26 MED ORDER — INSULIN ASPART 100 UNIT/ML ~~LOC~~ SOLN
8.0000 [IU] | Freq: Once | SUBCUTANEOUS | Status: AC
  Administered 2016-09-26: 8 [IU] via SUBCUTANEOUS
  Filled 2016-09-26: qty 1

## 2016-09-26 MED ORDER — CEPHALEXIN 500 MG PO CAPS
500.0000 mg | ORAL_CAPSULE | Freq: Once | ORAL | Status: AC
  Administered 2016-09-26: 500 mg via ORAL
  Filled 2016-09-26: qty 1

## 2016-09-26 NOTE — ED Provider Notes (Signed)
[facility]-EMERGENCY DEPT Provider Note   CSN: [id number] Arrival date & time: 09/26/16  1823     History   Chief Complaint Chief Complaint  Patient presents with  . Abdominal Pain  . Diarrhea    HPI Sheila Gibson is a 46 y.o. female.  HPI   46 year old female presents today with complaints of abdominal pain.  Patient notes a significant past medical history of upper abdominal pain diagnosed with cholelithiasis and pancreatitis.  She notes the symptoms are similar to her lower abdominal pain presenting today.  Patient notes over the last month she has had intermittent lower abdominal pain along the midline over her bladder.  She notes this is sharp in nature that is coming and going.  She notes no change in her frequency or characteristics of her pain, but does note she recently quit doing cocaine and started having diarrhea.  She notes quitting cocaine approximately 1 week ago with the onset of nonbloody non-profuse diarrhea.  She notes several episodes per day.  She denies any recent antibiotic exposure, abnormal food or drink.  She denies any fever, nausea, vomiting, upper abdominal pain.  No medications prior to arrival.,  Denies any dysuria or odor. No vaginal discharge or bleeding.  She notes she is a diabetic, and has not taken her insulin today as she did not have any food to eat today.   Past Medical History:  Diagnosis Date  . COPD (chronic obstructive pulmonary disease) (HCC)   . Diabetes mellitus without complication (HCC)   . Heart murmur   . Hepatic cirrhosis (HCC)   . Obesity   . Thyroid disease     There are no active problems to display for this patient.   Past Surgical History:  Procedure Laterality Date  . CESAREAN SECTION    . THYROIDECTOMY      OB History    No data available       Home Medications    Prior to Admission medications   Medication Sig Start Date End Date Taking? Authorizing Provider  albuterol (PROVENTIL HFA;VENTOLIN HFA) 108 (90  BASE) MCG/ACT inhaler Inhale 1-2 puffs into the lungs every 6 (six) hours as needed for wheezing or shortness of breath.   Yes [provider]  beclomethasone (QVAR) 80 MCG/ACT inhaler Inhale 2 puffs into the lungs 2 (two) times daily.   Yes [provider]  gabapentin (NEURONTIN) 300 MG capsule Take 600 mg by mouth 3 (three) times daily.    Yes [provider]  HYDROcodone-acetaminophen (NORCO) 5-325 MG tablet Take 1 tablet by mouth every 6 (six) hours as needed for severe pain. 04/28/16  Yes Loren Racer, MD  ibuprofen (ADVIL,MOTRIN) 800 MG tablet Take 800 mg by mouth every 8 (eight) hours as needed for moderate pain.   Yes [provider]  insulin glargine (LANTUS) 100 unit/mL SOPN Inject 60 Units into the skin 2 (two) times daily. 60 units in the morning and 60 units at night   Yes [provider]  tiotropium (SPIRIVA) 18 MCG inhalation capsule Place 18 mcg into inhaler and inhale daily.    Yes [provider]  tiZANidine (ZANAFLEX) 4 MG tablet Take 2-4 mg by mouth every 6 (six) hours as needed for muscle spasms.   Yes [provider]  amoxicillin (AMOXIL) 500 MG capsule Take 1 capsule (500 mg total) by mouth 3 (three) times daily. Patient not taking: Reported on 09/26/2016 06/29/16   Trixie Dredge, PA-C  cephALEXin (KEFLEX) 500 MG capsule Take 1  capsule (500 mg total) by mouth 2 (two) times daily. 09/27/16   Nichelle Renwick, Tinnie Gens, PA-C  clindamycin (CLEOCIN) 300 MG capsule Take 1 capsule (300 mg total) by mouth 4 (four) times daily. X 7 days Patient not taking: Reported on 09/26/2016 04/28/16   Loren Racer, MD  cyclobenzaprine (FLEXERIL) 10 MG tablet Take 1 tablet (10 mg total) by mouth 2 (two) times daily as needed for muscle spasms. Patient not taking: Reported on 04/28/2016 07/21/15   Levora Angel, MD  metFORMIN (GLUCOPHAGE) 500 MG tablet Take 1 tablet (500 mg total) by mouth 2 (two) times daily with a meal. Patient not taking:  Reported on 04/28/2016 08/21/13   Doug Sou, MD  methocarbamol (ROBAXIN) 500 MG tablet Take 1 tablet (500 mg total) by mouth 2 (two) times daily. Patient not taking: Reported on 04/28/2016 04/14/16   Muthersbaugh, Dahlia Client, PA-C  naproxen (NAPROSYN) 500 MG tablet Take 1 tablet (500 mg total) by mouth 2 (two) times daily with a meal. Patient not taking: Reported on 04/28/2016 04/14/16   Muthersbaugh, Dahlia Client, PA-C  neomycin-polymyxin-hydrocortisone (CORTISPORIN) 3.5-10000-1 OTIC suspension Place 4 drops into the left ear 4 (four) times daily. Patient not taking: Reported on 09/26/2016 06/29/16   Trixie Dredge, PA-C  ondansetron (ZOFRAN ODT) 4 MG disintegrating tablet  ODT q4 hours prn nausea/vomit Patient not taking: Reported on 04/28/2016 03/25/15   Mesner, Barbara Cower, MD  ondansetron (ZOFRAN) 4 MG tablet Take 1 tablet (4 mg total) by mouth every 6 (six) hours as needed for nausea or vomiting. Patient not taking: Reported on 09/26/2016 04/28/16   Loren Racer, MD  oxyCODONE (OXY IR/ROXICODONE) 5 MG immediate release tablet Take 1 tablet (5 mg total) by mouth every 4 (four) hours as needed for severe pain. Patient not taking: Reported on 04/28/2016 12/24/14   Molpus, Jonny Ruiz, MD  oxyCODONE-acetaminophen (PERCOCET/ROXICET) 5-325 MG tablet Take 1-2 tablets by mouth every 6 (six) hours as needed for severe pain. Patient not taking: Reported on 04/28/2016 04/19/15   Santiago Glad, PA-C  pantoprazole (PROTONIX) 20 MG tablet Take 1 tablet (20 mg total) by mouth 2 (two) times daily. Patient not taking: Reported on 09/26/2016 04/28/16   Loren Racer, MD    Family History Family History  Problem Relation Age of Onset  . Cancer Mother     Social History Social History  Substance Use Topics  . Smoking status: Never Smoker  . Smokeless tobacco: Never Used  . Alcohol use Yes     Comment: occasional drink     Allergies   Patient has no known allergies.   Review of Systems Review of Systems  All other  systems reviewed and are negative.   Physical Exam Updated Vital Signs BP 137/87 (BP Location: Left Arm)   Pulse 94   Temp 98.8 F (37.1 C) (Oral)   Resp 20   Ht  (1.702 m)   Wt 127 kg (280 lb)   LMP 09/12/2016   SpO2 99%   BMI 43.85 kg/m   Physical Exam  Constitutional: She is oriented to person, place, and time. She appears well-developed and well-nourished.  HENT:  Head: Normocephalic and atraumatic.  Eyes: Pupils are equal, round, and reactive to light. Conjunctivae are normal. Right eye exhibits no discharge. Left eye exhibits no discharge. No scleral icterus.  Neck: Normal range of motion. No JVD present. No tracheal deviation present.  Pulmonary/Chest: Effort normal. No stridor.  Abdominal: Soft. Bowel sounds are normal. She exhibits no distension and no mass. There is tenderness. There is  no rebound and no guarding. No hernia.  TTP of bladder, no lateral or upper abdominal pain   Neurological: She is alert and oriented to person, place, and time. Coordination normal.  Skin: Skin is warm.  Psychiatric: She has a normal mood and affect. Her behavior is normal. Judgment and thought content normal.  Nursing note and vitals reviewed.   ED Treatments / Results  Labs (all labs ordered are listed, but only abnormal results are displayed) Labs Reviewed  COMPREHENSIVE METABOLIC PANEL - Abnormal; Notable for the following:       Result Value   Sodium 133 (*)    Chloride 99 (*)    Glucose, Bld 452 (*)    Calcium 8.4 (*)    Total Protein 8.9 (*)    Albumin 3.0 (*)    AST 53 (*)    ALT 58 (*)    Alkaline Phosphatase 173 (*)    All other components within normal limits  CBC - Abnormal; Notable for the following:    RBC 3.78 (*)    Hemoglobin 11.5 (*)    HCT 34.4 (*)    All other components within normal limits  URINALYSIS, ROUTINE W REFLEX MICROSCOPIC - Abnormal; Notable for the following:    APPearance CLOUDY (*)    Specific Gravity, Urine 1.036 (*)    Glucose,  UA >=500 (*)    Hgb urine dipstick LARGE (*)    Leukocytes, UA LARGE (*)    Bacteria, UA RARE (*)    Squamous Epithelial / LPF 0-5 (*)    All other components within normal limits  CBG MONITORING, ED - Abnormal; Notable for the following:    Glucose-Capillary 294 (*)    All other components within normal limits  LIPASE, BLOOD  I-STAT BETA HCG BLOOD, ED ([facility], [facility], [facility] ONLY)    EKG  EKG Interpretation None       Radiology No results found.  Procedures Procedures (including critical care time)  Medications Ordered in ED Medications  sodium chloride 0.9 % bolus 1,000 mL (1,000 mLs Intravenous New Bag/Given 09/26/16 2307)  insulin aspart (novoLOG) injection 8 Units (8 Units Subcutaneous Given 09/26/16 2306)  cephALEXin (KEFLEX) capsule 500 mg (500 mg Oral Given 09/26/16 2347)     Initial Impression / Assessment and Plan / ED Course  I have reviewed the triage vital signs and the nursing notes.  Pertinent labs & imaging results that were available during my care of the patient were reviewed by me and considered in my medical decision making (see chart for details).       Labs: I-STAT beta-hCG, lipase, CMP, CBC, urinalysis  Imaging:  Consults:  Therapeutics: Insulin, normal saline, Keflex  Discharge Meds: Keflex  Assessment/Plan: 45 year old female presents today with complaints of abdominal pain.  This is over her bladder, she has a urine that is concerning for urinary tract infection.  Likely cystitis.  Patient is afebrile nontoxic with no nausea or vomiting.  Patient does have some minor diarrhea, doubt infectious in nature.  Patient also noted be hyperglycemic here, this is pretty typical for this patient.  She has no signs of DKA or hyperosmolar state.  She will be given a liter of fluid with an dose of insulin.  Patient has insulin at home, she will be counseled on use of insulin and monitoring her diabetes.   Patient blood sugar improved with insulin and saline.  She  given dose of antibiotics for UTI.  Discharged home on antibiotics, primary care follow-up  and strict return precautions.  She verbalized understanding and agreement to today's plan and had no further questions or concerns at time discharge.   Final Clinical Impressions(s) / ED Diagnoses   Final diagnoses:  Acute cystitis with hematuria  Hyperglycemia  Diarrhea, unspecified type    New Prescriptions New Prescriptions   CEPHALEXIN (KEFLEX) 500 MG CAPSULE    Take 1 capsule (500 mg total) by mouth 2 (two) times daily.     Eyvonne Mechanic, PA-C 09/27/16 0012    Derwood Kaplan, MD 09/27/16 [phone]

## 2016-09-26 NOTE — ED Triage Notes (Addendum)
Intermittent lower abdominal pain x 1 month and intermittent abdominal pain x 1 week. Patient denies vaginal discharge or dysuria.

## 2016-09-27 MED ORDER — CEPHALEXIN 500 MG PO CAPS: 500.0000 mg | ORAL_CAPSULE | Freq: Two times a day (BID) | ORAL | 0 refills | Status: DC

## 2016-09-27 NOTE — Discharge Instructions (Signed)
Please read attached information. If you experience any new or worsening signs or symptoms please return to the emergency room for evaluation. Please follow-up with your primary care provider or specialist as discussed. Please use medication prescribed only as directed and discontinue taking if you have any concerning signs or symptoms.   °

## 2016-10-14 ENCOUNTER — Emergency Department (HOSPITAL_COMMUNITY): Payer: Medicaid Other

## 2016-10-14 ENCOUNTER — Encounter (HOSPITAL_COMMUNITY): Payer: Self-pay | Admitting: Emergency Medicine

## 2016-10-14 ENCOUNTER — Emergency Department (HOSPITAL_COMMUNITY)
Admission: EM | Admit: 2016-10-14 | Discharge: 2016-10-15 | Disposition: A | Discharge status: Home / Self Care | Payer: Medicaid Other | Attending: Emergency Medicine | Admitting: Emergency Medicine

## 2016-10-14 DIAGNOSIS — Z79899 Other long term (current) drug therapy: Secondary | ICD-10-CM | POA: Diagnosis not present

## 2016-10-14 DIAGNOSIS — J449 Chronic obstructive pulmonary disease, unspecified: Secondary | ICD-10-CM | POA: Diagnosis not present

## 2016-10-14 DIAGNOSIS — R509 Fever, unspecified: Secondary | ICD-10-CM | POA: Diagnosis present

## 2016-10-14 DIAGNOSIS — Z794 Long term (current) use of insulin: Secondary | ICD-10-CM | POA: Insufficient documentation

## 2016-10-14 DIAGNOSIS — E119 Type 2 diabetes mellitus without complications: Secondary | ICD-10-CM | POA: Insufficient documentation

## 2016-10-14 DIAGNOSIS — J020 Streptococcal pharyngitis: Secondary | ICD-10-CM | POA: Insufficient documentation

## 2016-10-14 LAB — COMPREHENSIVE METABOLIC PANEL
ALBUMIN: 2.9 g/dL — AB (ref 3.5–5.0)
ALT: 55 U/L — ABNORMAL HIGH (ref 14–54)
ANION GAP: 5 (ref 5–15)
AST: 55 U/L — ABNORMAL HIGH (ref 15–41)
Alkaline Phosphatase: 153 U/L — ABNORMAL HIGH (ref 38–126)
BILIRUBIN TOTAL: 0.9 mg/dL (ref 0.3–1.2)
BUN: 14 mg/dL (ref 6–20)
CO2: 23 mmol/L (ref 22–32)
Calcium: 8.3 mg/dL — ABNORMAL LOW (ref 8.9–10.3)
Chloride: 108 mmol/L (ref 101–111)
Creatinine, Ser: 1.16 mg/dL — ABNORMAL HIGH (ref 0.44–1.00)
GFR calc non Af Amer: 56 mL/min — ABNORMAL LOW (ref >60)
GLUCOSE: 327 mg/dL — AB (ref 65–99)
Potassium: 4.2 mmol/L (ref 3.5–5.1)
SODIUM: 136 mmol/L (ref 135–145)
TOTAL PROTEIN: 8.5 g/dL — AB (ref 6.5–8.1)

## 2016-10-14 LAB — CBC WITH DIFFERENTIAL/PLATELET
BASOS ABS: 0 10*3/uL (ref 0.0–0.1)
BASOS PCT: 0 %
Eosinophils Absolute: 0.1 10*3/uL (ref 0.0–0.7)
Eosinophils Relative: 0 %
HEMATOCRIT: 34.9 % — AB (ref 36.0–46.0)
HEMOGLOBIN: 11.8 g/dL — AB (ref 12.0–15.0)
LYMPHS PCT: 16 %
Lymphs Abs: 2.3 10*3/uL (ref 0.7–4.0)
MCH: 31.1 pg (ref 26.0–34.0)
MCHC: 33.8 g/dL (ref 30.0–36.0)
MCV: 91.8 fL (ref 78.0–100.0)
MONO ABS: 0.9 10*3/uL (ref 0.1–1.0)
Monocytes Relative: 6 %
NEUTROS ABS: 10.8 10*3/uL — AB (ref 1.7–7.7)
NEUTROS PCT: 78 %
Platelets: 140 10*3/uL — ABNORMAL LOW (ref 150–400)
RBC: 3.8 MIL/uL — AB (ref 3.87–5.11)
RDW: 13.4 % (ref 11.5–15.5)
WBC: 14 10*3/uL — ABNORMAL HIGH (ref 4.0–10.5)

## 2016-10-14 LAB — CG4 I-STAT (LACTIC ACID): LACTIC ACID, VENOUS: 1.47 mmol/L (ref 0.5–1.9)

## 2016-10-14 LAB — RAPID STREP SCREEN (MED CTR MEBANE ONLY): Streptococcus, Group A Screen (Direct): POSITIVE — AB

## 2016-10-14 MED ORDER — ACETAMINOPHEN 325 MG PO TABS
650.0000 mg | ORAL_TABLET | Freq: Once | ORAL | Status: AC | PRN
  Administered 2016-10-14: 650 mg via ORAL
  Filled 2016-10-14: qty 2

## 2016-10-14 MED ORDER — PENICILLIN G BENZATHINE 1200000 UNIT/2ML IM SUSP
1.2000 10*6.[IU] | Freq: Once | INTRAMUSCULAR | Status: AC
  Administered 2016-10-15: 1.2 10*6.[IU] via INTRAMUSCULAR
  Filled 2016-10-14: qty 2

## 2016-10-14 NOTE — ED Triage Notes (Signed)
Patient is complaining of fever, body aches, sore throat x 2 days. Patient has not taking any medication.

## 2016-10-14 NOTE — ED Notes (Signed)
Bed: WTR6 Expected date:  Expected time:  Means of arrival:  Comments: 

## 2016-10-15 ENCOUNTER — Other Ambulatory Visit: Payer: Self-pay | Admitting: Sports Medicine

## 2016-10-15 DIAGNOSIS — M545 Low back pain: Secondary | ICD-10-CM

## 2016-10-15 MED ORDER — IBUPROFEN 800 MG PO TABS
800.0000 mg | ORAL_TABLET | Freq: Once | ORAL | Status: AC
  Administered 2016-10-15: 800 mg via ORAL
  Filled 2016-10-15: qty 1

## 2016-10-15 NOTE — ED Provider Notes (Signed)
TIME SEEN: 12:04 AM  CHIEF COMPLAINT: fever, sore throat, cough  HPI: Patient is a 46 year old female with history of diabetes, COPD, cirrhosis who presents to the emergency department with complaints of fever, sore throat, productive cough, body aches that started today. Her major complaint is her sore throat. No abdominal pain, nausea, vomiting or diarrhea. No dysuria, hematuria, vaginal bleeding or discharge. No rash. No known sick contacts or recent travel.  ROS: See HPI Constitutional: no fever  Eyes: no drainage  ENT: no runny nose   Cardiovascular:  no chest pain  Resp: no SOB  GI: no vomiting GU: no dysuria Integumentary: no rash  Allergy: no hives  Musculoskeletal: no leg swelling  Neurological: no slurred speech ROS otherwise negative  PAST MEDICAL HISTORY/PAST SURGICAL HISTORY:  Past Medical History:  Diagnosis Date  . COPD (chronic obstructive pulmonary disease) (HCC)   . Diabetes mellitus without complication (HCC)   . Heart murmur   . Hepatic cirrhosis (HCC)   . Obesity   . Thyroid disease     MEDICATIONS:  Prior to Admission medications   Medication Sig Start Date End Date Taking? Authorizing Provider  albuterol (PROVENTIL HFA;VENTOLIN HFA) 108 (90 BASE) MCG/ACT inhaler Inhale 1-2 puffs into the lungs every 6 (six) hours as needed for wheezing or shortness of breath.    [provider]  amoxicillin (AMOXIL) 500 MG capsule Take 1 capsule (500 mg total) by mouth 3 (three) times daily. Patient not taking: Reported on 09/26/2016 06/29/16   Trixie Dredge, PA-C  beclomethasone (QVAR) 80 MCG/ACT inhaler Inhale 2 puffs into the lungs 2 (two) times daily.    [provider]  cephALEXin (KEFLEX) 500 MG capsule Take 1 capsule (500 mg total) by mouth 2 (two) times daily. 09/27/16   Hedges, Tinnie Gens, PA-C  clindamycin (CLEOCIN) 300 MG capsule Take 1 capsule (300 mg total) by mouth 4 (four) times daily. X 7 days Patient not taking: Reported on 09/26/2016 04/28/16    Loren Racer, MD  cyclobenzaprine (FLEXERIL) 10 MG tablet Take 1 tablet (10 mg total) by mouth 2 (two) times daily as needed for muscle spasms. Patient not taking: Reported on 04/28/2016 07/21/15   Levora Angel, MD  gabapentin (NEURONTIN) 300 MG capsule Take 600 mg by mouth 3 (three) times daily.     [provider]  HYDROcodone-acetaminophen (NORCO) 5-325 MG tablet Take 1 tablet by mouth every 6 (six) hours as needed for severe pain. 04/28/16   Loren Racer, MD  ibuprofen (ADVIL,MOTRIN) 800 MG tablet Take 800 mg by mouth every 8 (eight) hours as needed for moderate pain.    [provider]  insulin glargine (LANTUS) 100 unit/mL SOPN Inject 60 Units into the skin 2 (two) times daily. 60 units in the morning and 60 units at night    [provider]  metFORMIN (GLUCOPHAGE) 500 MG tablet Take 1 tablet (500 mg total) by mouth 2 (two) times daily with a meal. Patient not taking: Reported on 04/28/2016 08/21/13   Doug Sou, MD  methocarbamol (ROBAXIN) 500 MG tablet Take 1 tablet (500 mg total) by mouth 2 (two) times daily. Patient not taking: Reported on 04/28/2016 04/14/16   Muthersbaugh, Dahlia Client, PA-C  naproxen (NAPROSYN) 500 MG tablet Take 1 tablet (500 mg total) by mouth 2 (two) times daily with a meal. Patient not taking: Reported on 04/28/2016 04/14/16   Muthersbaugh, Dahlia Client, PA-C  neomycin-polymyxin-hydrocortisone (CORTISPORIN) 3.5-10000-1 OTIC suspension Place 4 drops into the left ear 4 (four) times daily. Patient not taking: Reported  on 09/26/2016 06/29/16   Trixie Dredge, PA-C  ondansetron (ZOFRAN ODT) 4 MG disintegrating tablet  ODT q4 hours prn nausea/vomit Patient not taking: Reported on 04/28/2016 03/25/15   Mesner, Barbara Cower, MD  ondansetron (ZOFRAN) 4 MG tablet Take 1 tablet (4 mg total) by mouth every 6 (six) hours as needed for nausea or vomiting. Patient not taking: Reported on 09/26/2016 04/28/16   Loren Racer, MD  oxyCODONE (OXY IR/ROXICODONE) 5 MG  immediate release tablet Take 1 tablet (5 mg total) by mouth every 4 (four) hours as needed for severe pain. Patient not taking: Reported on 04/28/2016 12/24/14   Molpus, Jonny Ruiz, MD  oxyCODONE-acetaminophen (PERCOCET/ROXICET) 5-325 MG tablet Take 1-2 tablets by mouth every 6 (six) hours as needed for severe pain. Patient not taking: Reported on 04/28/2016 04/19/15   Santiago Glad, PA-C  pantoprazole (PROTONIX) 20 MG tablet Take 1 tablet (20 mg total) by mouth 2 (two) times daily. Patient not taking: Reported on 09/26/2016 04/28/16   Loren Racer, MD  tiotropium (SPIRIVA) 18 MCG inhalation capsule Place 18 mcg into inhaler and inhale daily.     [provider]  tiZANidine (ZANAFLEX) 4 MG tablet Take 2-4 mg by mouth every 6 (six) hours as needed for muscle spasms.    [provider]    ALLERGIES:  No Known Allergies  SOCIAL HISTORY:  Social History  Substance Use Topics  . Smoking status: Never Smoker  . Smokeless tobacco: Never Used  . Alcohol use Yes     Comment: occasional drink    FAMILY HISTORY: Family History  Problem Relation Age of Onset  . Cancer Mother     EXAM: BP (!) 161/102 (BP Location: Left Arm)   Pulse (!) 115   Temp (!) 101.3 F (38.5 C) (Oral)   Resp (!) 24   Ht  (1.702 m)   Wt 132.3 kg (291 lb 9.6 oz)   LMP 10/01/2016   SpO2 95%   BMI 45.67 kg/m  CONSTITUTIONAL: Alert and oriented and responds appropriately to questions. Well-appearing; well-nourished, obese, febrile but nontoxic appearing, appears well-hydrated HEAD: Normocephalic EYES: Conjunctivae clear, pupils appear equal, EOMI ENT: normal nose; moist mucous membranes;patient has pharyngeal erythema without petechiae, status post tonsillectomy; no uvular deviation, no unilateral swelling, no trismus or drooling, no muffled voice, normal phonation, no stridor, no dental caries present, no drainable dental abscess noted, no Ludwig's angina, tongue sits flat in the bottom of the  mouth, no angioedema, no facial erythema or warmth, no facial swelling; no pain with movement of the neck. NECK: Supple, no meningismus, no nuchal rigidity, no LAD  CARD: RRR; S1 and S2 appreciated; no murmurs, no clicks, no rubs, no gallops RESP: Normal chest excursion without splinting or tachypnea; breath sounds clear and equal bilaterally; no wheezes, no rhonchi, no rales, no hypoxia or respiratory distress, speaking full sentences ABD/GI: Normal bowel sounds; non-distended; soft, non-tender, no rebound, no guarding, no peritoneal signs, no hepatosplenomegaly BACK:  The back appears normal and is non-tender to palpation, there is no CVA tenderness EXT: Normal ROM in all joints; non-tender to palpation; no edema; normal capillary refill; no cyanosis, no calf tenderness or swelling    SKIN: Normal color for age and race; warm; no rash NEURO: Moves all extremities equally PSYCH: The patient's mood and manner are appropriate. Grooming and personal hygiene are appropriate.  MEDICAL DECISION MAKING: Patient here with strep pharyngitis. Labs show leukocytosis with left shift. She is febrile and was initially tachycardic but this has improved. Otherwise  labs appear at her baseline. LFTs mildly elevated from her history of cirrhosis. Lactate normal. She does not appear septic. Hypertension. Chest x-ray clear. No urinary symptoms. Given dose of IM penicillin here and treated fever and pain with ibuprofen. Patient comfortable with plan for discharge home. No sign of any airway compromise. Swalling secretions without difficulty. No sign of PTA, deep space neck infection, meningitis on exam.  At this time, I do not feel there is any life-threatening condition present. I have reviewed and discussed all results (EKG, imaging, lab, urine as appropriate) and exam findings with patient/family. I have reviewed nursing notes and appropriate previous records.  I feel the patient is safe to be discharged home without  further emergent workup and can continue workup as an outpatient as needed. Discussed usual and customary return precautions. Patient/family verbalize understanding and are comfortable with this plan.  Outpatient follow-up has been provided if needed. All questions have been answered.      Suzanne Kho, Layla Maw, DO 10/15/16 [phone]

## 2016-10-15 NOTE — Discharge Instructions (Signed)
You may alternate Tylenol 1000 mg every 6 hours as needed for pain and Ibuprofen 800 mg every 8 hours as needed for pain.  Please take Ibuprofen with food.   You have been given a dose of antibiotics here for your strep throat. You do not need to go home with prescription for any antibiotics. You may gargle with warm salt water several times a day which may help with your pain. You may also use over-the-counter Chloraseptic spray. Your chest x-ray did not show any pneumonia.

## 2016-10-20 ENCOUNTER — Other Ambulatory Visit: Payer: Medicaid Other

## 2016-10-21 ENCOUNTER — Ambulatory Visit
Admission: RE | Admit: 2016-10-21 | Discharge: 2016-10-21 | Disposition: A | Discharge status: Home / Self Care | Payer: Medicaid Other | Source: Ambulatory Visit | Attending: Sports Medicine | Admitting: Sports Medicine

## 2016-10-21 DIAGNOSIS — M545 Low back pain: Secondary | ICD-10-CM

## 2016-10-22 DIAGNOSIS — Y929 Unspecified place or not applicable: Secondary | ICD-10-CM | POA: Insufficient documentation

## 2016-10-22 DIAGNOSIS — J449 Chronic obstructive pulmonary disease, unspecified: Secondary | ICD-10-CM | POA: Diagnosis not present

## 2016-10-22 DIAGNOSIS — S27329A Contusion of lung, unspecified, initial encounter: Secondary | ICD-10-CM | POA: Insufficient documentation

## 2016-10-22 DIAGNOSIS — Z79899 Other long term (current) drug therapy: Secondary | ICD-10-CM | POA: Insufficient documentation

## 2016-10-22 DIAGNOSIS — Y939 Activity, unspecified: Secondary | ICD-10-CM | POA: Diagnosis not present

## 2016-10-22 DIAGNOSIS — Z794 Long term (current) use of insulin: Secondary | ICD-10-CM | POA: Diagnosis not present

## 2016-10-22 DIAGNOSIS — Y999 Unspecified external cause status: Secondary | ICD-10-CM | POA: Insufficient documentation

## 2016-10-22 DIAGNOSIS — R0789 Other chest pain: Secondary | ICD-10-CM | POA: Diagnosis present

## 2016-10-22 DIAGNOSIS — E119 Type 2 diabetes mellitus without complications: Secondary | ICD-10-CM | POA: Diagnosis not present

## 2016-10-23 ENCOUNTER — Emergency Department (HOSPITAL_COMMUNITY): Payer: Medicaid Other

## 2016-10-23 ENCOUNTER — Emergency Department (HOSPITAL_COMMUNITY)
Admission: EM | Admit: 2016-10-23 | Discharge: 2016-10-23 | Disposition: A | Discharge status: Home / Self Care | Payer: Medicaid Other | Attending: Emergency Medicine | Admitting: Emergency Medicine

## 2016-10-23 ENCOUNTER — Encounter (HOSPITAL_COMMUNITY): Payer: Self-pay | Admitting: Emergency Medicine

## 2016-10-23 DIAGNOSIS — S27329A Contusion of lung, unspecified, initial encounter: Secondary | ICD-10-CM

## 2016-10-23 LAB — PREGNANCY, URINE: Preg Test, Ur: NEGATIVE

## 2016-10-23 MED ORDER — AZITHROMYCIN 250 MG PO TABS: 250.0000 mg | ORAL_TABLET | Freq: Every day | ORAL | 0 refills | Status: DC

## 2016-10-23 MED ORDER — HYDROCODONE-ACETAMINOPHEN 5-325 MG PO TABS: 1.0000 | ORAL_TABLET | Freq: Four times a day (QID) | ORAL | 0 refills | Status: DC | PRN
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
## 2016-10-23 NOTE — ED Notes (Signed)
Pt. Ambulated in hallways without s/s of distress , O2 sat % sustained >98% on RA.

## 2016-10-23 NOTE — ED Triage Notes (Signed)
Pt states she would like a pregnancy test while she is here  Pt states urine tests are not accurate with her
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
## 2016-10-27 ENCOUNTER — Emergency Department (HOSPITAL_COMMUNITY)
Admission: EM | Admit: 2016-10-27 | Discharge: 2016-10-27 | Disposition: A | Discharge status: Home / Self Care | Payer: Medicaid Other | Attending: Emergency Medicine | Admitting: Emergency Medicine

## 2016-10-27 ENCOUNTER — Emergency Department (HOSPITAL_COMMUNITY): Payer: Medicaid Other

## 2016-10-27 ENCOUNTER — Other Ambulatory Visit: Payer: Self-pay

## 2016-10-27 ENCOUNTER — Encounter (HOSPITAL_COMMUNITY): Payer: Self-pay | Admitting: Emergency Medicine

## 2016-10-27 DIAGNOSIS — J449 Chronic obstructive pulmonary disease, unspecified: Secondary | ICD-10-CM | POA: Diagnosis not present

## 2016-10-27 DIAGNOSIS — Z794 Long term (current) use of insulin: Secondary | ICD-10-CM | POA: Insufficient documentation

## 2016-10-27 DIAGNOSIS — E119 Type 2 diabetes mellitus without complications: Secondary | ICD-10-CM | POA: Diagnosis not present

## 2016-10-27 DIAGNOSIS — I10 Essential (primary) hypertension: Secondary | ICD-10-CM | POA: Diagnosis not present

## 2016-10-27 DIAGNOSIS — R072 Precordial pain: Secondary | ICD-10-CM | POA: Diagnosis not present

## 2016-10-27 DIAGNOSIS — Z79899 Other long term (current) drug therapy: Secondary | ICD-10-CM | POA: Diagnosis not present

## 2016-10-27 DIAGNOSIS — R0789 Other chest pain: Secondary | ICD-10-CM | POA: Diagnosis present

## 2016-10-27 LAB — I-STAT CHEM 8, ED
BUN: 11 mg/dL (ref 6–20)
Calcium, Ion: 1.1 mmol/L — ABNORMAL LOW (ref 1.15–1.40)
Chloride: 99 mmol/L — ABNORMAL LOW (ref 101–111)
Creatinine, Ser: 0.7 mg/dL (ref 0.44–1.00)
GLUCOSE: 167 mg/dL — AB (ref 65–99)
HEMATOCRIT: 36 % (ref 36.0–46.0)
HEMOGLOBIN: 12.2 g/dL (ref 12.0–15.0)
POTASSIUM: 4.2 mmol/L (ref 3.5–5.1)
Sodium: 136 mmol/L (ref 135–145)
TCO2: 30 mmol/L (ref 22–32)

## 2016-10-27 LAB — I-STAT BETA HCG BLOOD, ED (MC, WL, AP ONLY)

## 2016-10-27 LAB — I-STAT TROPONIN, ED: Troponin i, poc: 0 ng/mL (ref 0.00–0.08)

## 2016-10-27 MED ORDER — KETOROLAC TROMETHAMINE 30 MG/ML IJ SOLN
30.0000 mg | Freq: Once | INTRAMUSCULAR | Status: AC
  Administered 2016-10-27: 30 mg via INTRAMUSCULAR

## 2016-10-27 MED ORDER — KETOROLAC TROMETHAMINE 30 MG/ML IJ SOLN
30.0000 mg | Freq: Once | INTRAMUSCULAR | Status: DC
  Filled 2016-10-27: qty 1

## 2016-10-27 MED ORDER — DOXYCYCLINE HYCLATE 100 MG PO CAPS: 100.0000 mg | ORAL_CAPSULE | Freq: Two times a day (BID) | ORAL | 0 refills | Status: AC

## 2016-10-27 MED ORDER — OXYCODONE HCL 5 MG PO TABS
5.0000 mg | ORAL_TABLET | Freq: Once | ORAL | Status: AC
  Administered 2016-10-27: 5 mg via ORAL
  Filled 2016-10-27: qty 1

## 2016-10-27 MED ORDER — IOPAMIDOL (ISOVUE-370) INJECTION 76%
100.0000 mL | Freq: Once | INTRAVENOUS | Status: AC | PRN
  Administered 2016-10-27: 100 mL via INTRAVENOUS

## 2016-10-27 MED ORDER — IOPAMIDOL (ISOVUE-370) INJECTION 76%
INTRAVENOUS | Status: AC
  Filled 2016-10-27: qty 100

## 2016-10-27 NOTE — Discharge Instructions (Signed)
You presented to ED for left sided chest wall pain.  Below is your CT scan results. Please take doxycyline as prescribed.  For pain take 600 mg ibuprofen + 1000 mg tylenol every 8 hours. See your primary care provider in 1 week for re-evaluation. You need a repeat CT scan or x-ray to ensure clearance.   IMPRESSION: 1. No evidence of pulmonary embolism, with mild study limitations detailed above. 2. Focal consolidation within the inferior aspects of the left upper lobe, measuring 5 x 1.7 cm. Additional smaller patchy consolidations in the right upper lobe, largest component measuring approximately 2 cm. These consolidations are nonspecific in appearance. Findings are suspicious for multifocal pneumonia if febrile. Lung contusions are considered less likely. Neoplastic process is also considered less likely. Recommend follow-up chest CT in 2-3 months to ensure resolution. 3. Right thyroid lobe mass/enlargement, measuring 3.6 cm. Recommend nonemergent thyroid ultrasound for further characterization.
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
## 2016-10-27 NOTE — ED Notes (Signed)
Pulse oximetry remained at 98% while walking.  No complaints.

## 2016-10-27 NOTE — ED Notes (Signed)
Bed: WTR7 Expected date:  Expected time:  Means of arrival:  Comments: 
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
## 2016-11-25 ENCOUNTER — Ambulatory Visit (HOSPITAL_COMMUNITY)
Admission: EM | Admit: 2016-11-25 | Discharge: 2016-11-25 | Disposition: A | Discharge status: Home / Self Care | Payer: Medicaid Other

## 2016-11-25 ENCOUNTER — Encounter (HOSPITAL_COMMUNITY): Payer: Self-pay

## 2016-11-25 ENCOUNTER — Encounter (HOSPITAL_COMMUNITY): Payer: Self-pay | Admitting: Emergency Medicine

## 2016-11-25 ENCOUNTER — Emergency Department (HOSPITAL_COMMUNITY)
Admission: EM | Admit: 2016-11-25 | Discharge: 2016-11-26 | Disposition: A | Discharge status: Home / Self Care | Payer: Medicaid Other | Attending: Emergency Medicine | Admitting: Emergency Medicine

## 2016-11-25 ENCOUNTER — Emergency Department (HOSPITAL_COMMUNITY): Payer: Medicaid Other

## 2016-11-25 DIAGNOSIS — E119 Type 2 diabetes mellitus without complications: Secondary | ICD-10-CM | POA: Insufficient documentation

## 2016-11-25 DIAGNOSIS — Z79899 Other long term (current) drug therapy: Secondary | ICD-10-CM | POA: Diagnosis not present

## 2016-11-25 DIAGNOSIS — H547 Unspecified visual loss: Secondary | ICD-10-CM

## 2016-11-25 DIAGNOSIS — I160 Hypertensive urgency: Secondary | ICD-10-CM

## 2016-11-25 DIAGNOSIS — R51 Headache: Secondary | ICD-10-CM | POA: Diagnosis not present

## 2016-11-25 DIAGNOSIS — Z794 Long term (current) use of insulin: Secondary | ICD-10-CM | POA: Insufficient documentation

## 2016-11-25 DIAGNOSIS — I10 Essential (primary) hypertension: Secondary | ICD-10-CM | POA: Diagnosis not present

## 2016-11-25 DIAGNOSIS — G44209 Tension-type headache, unspecified, not intractable: Secondary | ICD-10-CM

## 2016-11-25 DIAGNOSIS — E079 Disorder of thyroid, unspecified: Secondary | ICD-10-CM | POA: Insufficient documentation

## 2016-11-25 DIAGNOSIS — R519 Headache, unspecified: Secondary | ICD-10-CM

## 2016-11-25 DIAGNOSIS — J449 Chronic obstructive pulmonary disease, unspecified: Secondary | ICD-10-CM | POA: Diagnosis not present

## 2016-11-25 MED ORDER — METOCLOPRAMIDE HCL 5 MG/ML IJ SOLN
10.0000 mg | Freq: Once | INTRAMUSCULAR | Status: AC
  Administered 2016-11-25: 10 mg via INTRAVENOUS
  Filled 2016-11-25: qty 2

## 2016-11-25 MED ORDER — DIPHENHYDRAMINE HCL 50 MG/ML IJ SOLN
12.5000 mg | Freq: Once | INTRAMUSCULAR | Status: AC
  Administered 2016-11-25: 12.5 mg via INTRAVENOUS
  Filled 2016-11-25: qty 1

## 2016-11-25 NOTE — Discharge Instructions (Signed)
Go straight to the ED

## 2016-11-25 NOTE — ED Notes (Signed)
Patient transported to CT 

## 2016-11-25 NOTE — ED Triage Notes (Signed)
Pt sts body aches x 3 days and HA
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
## 2016-11-25 NOTE — ED Triage Notes (Signed)
Pt states that she has a headache since Friday, with nausea and photosensitivity. Pt states that yesterday her vision began to go blurry on and off. [facility] was seen at [facility] at told to come here for a CT scan.

## 2016-11-26 ENCOUNTER — Encounter (HOSPITAL_COMMUNITY): Payer: Self-pay | Admitting: Emergency Medicine

## 2016-11-26 LAB — I-STAT CHEM 8, ED
BUN: 9 mg/dL (ref 6–20)
CALCIUM ION: 1.11 mmol/L — AB (ref 1.15–1.40)
CHLORIDE: 104 mmol/L (ref 101–111)
Creatinine, Ser: 0.6 mg/dL (ref 0.44–1.00)
Glucose, Bld: 125 mg/dL — ABNORMAL HIGH (ref 65–99)
HEMATOCRIT: 37 % (ref 36.0–46.0)
Hemoglobin: 12.6 g/dL (ref 12.0–15.0)
POTASSIUM: 3.7 mmol/L (ref 3.5–5.1)
SODIUM: 140 mmol/L (ref 135–145)
TCO2: 26 mmol/L (ref 22–32)

## 2016-11-26 LAB — CBC WITH DIFFERENTIAL/PLATELET
BASOS ABS: 0 10*3/uL (ref 0.0–0.1)
BASOS PCT: 0 %
EOS ABS: 0.1 10*3/uL (ref 0.0–0.7)
EOS PCT: 1 %
HCT: 35.1 % — ABNORMAL LOW (ref 36.0–46.0)
Hemoglobin: 11.8 g/dL — ABNORMAL LOW (ref 12.0–15.0)
Lymphocytes Relative: 32 %
Lymphs Abs: 3.2 10*3/uL (ref 0.7–4.0)
MCH: 30.5 pg (ref 26.0–34.0)
MCHC: 33.6 g/dL (ref 30.0–36.0)
MCV: 90.7 fL (ref 78.0–100.0)
Monocytes Absolute: 0.8 10*3/uL (ref 0.1–1.0)
Monocytes Relative: 8 %
NEUTROS PCT: 59 %
Neutro Abs: 6 10*3/uL (ref 1.7–7.7)
PLATELETS: 151 10*3/uL (ref 150–400)
RBC: 3.87 MIL/uL (ref 3.87–5.11)
RDW: 13.8 % (ref 11.5–15.5)
WBC: 10.1 10*3/uL (ref 4.0–10.5)

## 2016-11-26 LAB — I-STAT BETA HCG BLOOD, ED (MC, WL, AP ONLY)

## 2016-11-26 MED ORDER — METOCLOPRAMIDE HCL 10 MG PO TABS: 10.0000 mg | ORAL_TABLET | Freq: Four times a day (QID) | ORAL | 0 refills | Status: DC | PRN

## 2016-11-26 MED ORDER — KETOROLAC TROMETHAMINE 30 MG/ML IJ SOLN
15.0000 mg | Freq: Once | INTRAMUSCULAR | Status: AC
  Administered 2016-11-26: 15 mg via INTRAVENOUS
  Filled 2016-11-26: qty 1
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
## 2016-11-26 NOTE — ED Notes (Signed)
Visual Acuity completed without Pt having glasses on. Pt stated she is suppose to wear corrective lenses.

## 2016-12-17 ENCOUNTER — Emergency Department (HOSPITAL_COMMUNITY)
Admission: EM | Admit: 2016-12-17 | Discharge: 2016-12-17 | Disposition: A | Discharge status: Home / Self Care | Payer: Medicaid Other | Attending: Emergency Medicine | Admitting: Emergency Medicine

## 2016-12-17 ENCOUNTER — Encounter (HOSPITAL_COMMUNITY): Payer: Self-pay | Admitting: Emergency Medicine

## 2016-12-17 ENCOUNTER — Other Ambulatory Visit: Payer: Self-pay

## 2016-12-17 DIAGNOSIS — Y939 Activity, unspecified: Secondary | ICD-10-CM | POA: Diagnosis not present

## 2016-12-17 DIAGNOSIS — J449 Chronic obstructive pulmonary disease, unspecified: Secondary | ICD-10-CM | POA: Insufficient documentation

## 2016-12-17 DIAGNOSIS — Y92481 Parking lot as the place of occurrence of the external cause: Secondary | ICD-10-CM | POA: Diagnosis not present

## 2016-12-17 DIAGNOSIS — Z79899 Other long term (current) drug therapy: Secondary | ICD-10-CM | POA: Diagnosis not present

## 2016-12-17 DIAGNOSIS — M542 Cervicalgia: Secondary | ICD-10-CM | POA: Insufficient documentation

## 2016-12-17 DIAGNOSIS — Z794 Long term (current) use of insulin: Secondary | ICD-10-CM | POA: Insufficient documentation

## 2016-12-17 DIAGNOSIS — Y999 Unspecified external cause status: Secondary | ICD-10-CM | POA: Insufficient documentation

## 2016-12-17 DIAGNOSIS — E119 Type 2 diabetes mellitus without complications: Secondary | ICD-10-CM | POA: Insufficient documentation

## 2016-12-17 MED ORDER — CYCLOBENZAPRINE HCL 10 MG PO TABS: 10.0000 mg | ORAL_TABLET | Freq: Two times a day (BID) | ORAL | 0 refills | Status: DC | PRN

## 2016-12-17 MED ORDER — HYDROCODONE-ACETAMINOPHEN 5-325 MG PO TABS: 1.0000 | ORAL_TABLET | Freq: Four times a day (QID) | ORAL | 0 refills | Status: DC | PRN
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
## 2017-01-23 ENCOUNTER — Other Ambulatory Visit: Payer: Self-pay | Admitting: Family Medicine

## 2017-01-23 DIAGNOSIS — N6452 Nipple discharge: Secondary | ICD-10-CM

## 2017-01-28 ENCOUNTER — Other Ambulatory Visit: Payer: Medicaid Other

## 2017-02-06 ENCOUNTER — Encounter (HOSPITAL_COMMUNITY): Payer: Self-pay

## 2017-02-06 ENCOUNTER — Emergency Department (HOSPITAL_COMMUNITY)
Admission: EM | Admit: 2017-02-06 | Discharge: 2017-02-06 | Disposition: A | Discharge status: Home / Self Care | Payer: Medicaid Other | Attending: Emergency Medicine | Admitting: Emergency Medicine

## 2017-02-06 ENCOUNTER — Other Ambulatory Visit: Payer: Self-pay

## 2017-02-06 ENCOUNTER — Emergency Department (HOSPITAL_COMMUNITY): Payer: Medicaid Other

## 2017-02-06 DIAGNOSIS — E119 Type 2 diabetes mellitus without complications: Secondary | ICD-10-CM | POA: Insufficient documentation

## 2017-02-06 DIAGNOSIS — T07XXXA Unspecified multiple injuries, initial encounter: Secondary | ICD-10-CM | POA: Diagnosis present

## 2017-02-06 DIAGNOSIS — S3992XA Unspecified injury of lower back, initial encounter: Secondary | ICD-10-CM | POA: Diagnosis not present

## 2017-02-06 DIAGNOSIS — J449 Chronic obstructive pulmonary disease, unspecified: Secondary | ICD-10-CM | POA: Diagnosis not present

## 2017-02-06 DIAGNOSIS — S0083XA Contusion of other part of head, initial encounter: Secondary | ICD-10-CM

## 2017-02-06 DIAGNOSIS — Z79899 Other long term (current) drug therapy: Secondary | ICD-10-CM | POA: Diagnosis not present

## 2017-02-06 DIAGNOSIS — S63502A Unspecified sprain of left wrist, initial encounter: Secondary | ICD-10-CM | POA: Diagnosis not present

## 2017-02-06 DIAGNOSIS — R300 Dysuria: Secondary | ICD-10-CM | POA: Diagnosis not present

## 2017-02-06 DIAGNOSIS — Y999 Unspecified external cause status: Secondary | ICD-10-CM | POA: Diagnosis not present

## 2017-02-06 DIAGNOSIS — Z794 Long term (current) use of insulin: Secondary | ICD-10-CM | POA: Insufficient documentation

## 2017-02-06 DIAGNOSIS — W1830XA Fall on same level, unspecified, initial encounter: Secondary | ICD-10-CM | POA: Diagnosis not present

## 2017-02-06 DIAGNOSIS — I10 Essential (primary) hypertension: Secondary | ICD-10-CM | POA: Diagnosis not present

## 2017-02-06 DIAGNOSIS — M25511 Pain in right shoulder: Secondary | ICD-10-CM | POA: Diagnosis not present

## 2017-02-06 DIAGNOSIS — Y92511 Restaurant or cafe as the place of occurrence of the external cause: Secondary | ICD-10-CM | POA: Insufficient documentation

## 2017-02-06 DIAGNOSIS — Y939 Activity, unspecified: Secondary | ICD-10-CM | POA: Diagnosis not present

## 2017-02-06 DIAGNOSIS — W19XXXA Unspecified fall, initial encounter: Secondary | ICD-10-CM

## 2017-02-06 HISTORY — DX: Depression, unspecified: F32.A

## 2017-02-06 HISTORY — DX: Major depressive disorder, single episode, unspecified: F32.9

## 2017-02-06 LAB — URINALYSIS, ROUTINE W REFLEX MICROSCOPIC
Bilirubin Urine: NEGATIVE
Glucose, UA: >=500 mg/dL — AB
Hgb urine dipstick: NEGATIVE
Ketones, ur: NEGATIVE mg/dL
Leukocytes, UA: NEGATIVE
Nitrite: POSITIVE — AB
PH: 6 (ref 5.0–8.0)
Protein, ur: 30 mg/dL — AB
SPECIFIC GRAVITY, URINE: 1.027 (ref 1.005–1.030)

## 2017-02-06 MED ORDER — CYCLOBENZAPRINE HCL 10 MG PO TABS
10.0000 mg | ORAL_TABLET | Freq: Once | ORAL | Status: AC
  Administered 2017-02-06: 10 mg via ORAL
  Filled 2017-02-06: qty 1

## 2017-02-06 MED ORDER — HYDROCODONE-ACETAMINOPHEN 5-325 MG PO TABS
1.0000 | ORAL_TABLET | Freq: Once | ORAL | Status: AC
  Administered 2017-02-06: 1 via ORAL
  Filled 2017-02-06: qty 1

## 2017-02-06 MED ORDER — CYCLOBENZAPRINE HCL 10 MG PO TABS: 10.0000 mg | ORAL_TABLET | Freq: Two times a day (BID) | ORAL | 0 refills | Status: DC | PRN
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
## 2017-02-06 NOTE — ED Provider Notes (Signed)
[facility]-EMERGENCY DEPT Provider Note   CSN: [id number] Arrival date & time: 02/06/17  1347     History   Chief Complaint Chief Complaint  Patient presents with  . Fall  . Back Pain  . Arm Pain  . Facial Pain    HPI Sheila Gibson is a 47 y.o. female who presents to the ED s/p fall with complaint of multiple body aches. Patient states she went into the bathroom at [facility] and tripped on the rug at the door causing her to fall. Patient states she fell forward causing her face to hit the metal part of the bathroom door. EMS was called. Patient reports that the most of the pain is located in her lower back but also pain to the left wrist and right upper arm. Patient denies LOC.   The history is provided by the patient. No language interpreter was used.  Fall  This is a new problem. The current episode started 1 to 2 hours ago. Associated symptoms include abdominal pain and headaches. Pertinent negatives include no chest pain and no shortness of breath. The symptoms are aggravated by walking, twisting and bending. Nothing relieves the symptoms. She has tried nothing for the symptoms.  Back Pain   Associated symptoms include headaches, abdominal pain and dysuria. Pertinent negatives include no chest pain and no fever.  Arm Pain  Associated symptoms include abdominal pain and headaches. Pertinent negatives include no chest pain and no shortness of breath.    Past Medical History:  Diagnosis Date  . COPD (chronic obstructive pulmonary disease) (HCC)   . Depression   . Diabetes mellitus without complication (HCC)   . Heart murmur   . Hepatic cirrhosis (HCC)   . Hypertension   . Obesity   . Thyroid disease     There are no active problems to display for this patient.   Past Surgical History:  Procedure Laterality Date  . CESAREAN SECTION    . THYROIDECTOMY      OB History    No data available       Home Medications    Prior to Admission  medications   Medication Sig Start Date End Date Taking? Authorizing Provider  albuterol (PROVENTIL HFA;VENTOLIN HFA) 108 (90 BASE) MCG/ACT inhaler Inhale 1-2 puffs into the lungs every 6 (six) hours as needed for wheezing or shortness of breath.   Yes [provider]  beclomethasone (QVAR) 80 MCG/ACT inhaler Inhale 2 puffs into the lungs 2 (two) times daily.   Yes [provider]  gabapentin (NEURONTIN) 300 MG capsule Take 300 mg by mouth 3 (three) times daily.    Yes [provider]  ibuprofen (ADVIL,MOTRIN) 800 MG tablet Take 800 mg by mouth every 8 (eight) hours as needed for moderate pain.   Yes [provider]  insulin glargine (LANTUS) 100 unit/mL SOPN Inject 60 Units into the skin 2 (two) times daily. 60 units in the morning and 60 units at night   Yes [provider]  promethazine (PHENERGAN) 25 MG tablet Take 25 mg by mouth every 12 (twelve) hours as needed for nausea/vomiting. 01/23/17  Yes [provider]  tiotropium (SPIRIVA) 18 MCG inhalation capsule Place 18 mcg into inhaler and inhale daily.    Yes [provider]  tiZANidine (ZANAFLEX) 4 MG tablet Take 2-4 mg by mouth every 6 (six) hours as needed for muscle spasms.   Yes [provider]  zolpidem (AMBIEN) 5 MG tablet Take 5 mg by  mouth at bedtime as needed for sleep. 01/23/17  Yes [provider]  cyclobenzaprine (FLEXERIL) 10 MG tablet Take 1 tablet (10 mg total) by mouth 2 (two) times daily as needed for muscle spasms. 02/06/17   Janne NapoleonNeese, Pressley Tadesse M, NP    Family History Family History  Problem Relation Age of Onset  . Cancer Mother   . Hypertension Other     Social History Social History   Tobacco Use  . Smoking status: Never Smoker  . Smokeless tobacco: Never Used  Substance Use Topics  . Alcohol use: Yes    Comment: occasional drink  . Drug use: No     Allergies   Patient has no known allergies.   Review of Systems Review of Systems    Constitutional: Negative for chills and fever.  HENT: Negative.   Eyes: Negative for visual disturbance.  Respiratory: Negative for cough and shortness of breath.   Cardiovascular: Negative for chest pain.  Gastrointestinal: Positive for abdominal pain. Negative for nausea and vomiting.  Genitourinary: Positive for dysuria, frequency and urgency.       Given Rx by PCP today for UTI  Musculoskeletal: Positive for arthralgias and back pain. Negative for neck pain.  Skin: Negative for wound.  Neurological: Positive for headaches. Negative for dizziness and syncope.  Psychiatric/Behavioral: Negative for confusion.     Physical Exam Updated Vital Signs BP (!) 192/96 (BP Location: Right Arm)   Pulse 86   Temp 97.8 F (36.6 C) (Oral)   Resp 20   LMP 11/10/2016 Comment: signed pregnancy waiver for xray  SpO2 99%   Physical Exam  Constitutional: She is oriented to person, place, and time. No distress.  Morbidly obese  HENT:  Right Ear: Tympanic membrane normal.  Left Ear: Tympanic membrane normal.  Nose: Nose normal.  Mouth/Throat: Uvula is midline, oropharynx is clear and moist and mucous membranes are normal. Normal dentition.  Contusion face  Eyes: Conjunctivae and EOM are normal. Pupils are equal, round, and reactive to light.  Neck: Trachea normal and normal range of motion. Neck supple. No spinous process tenderness and no muscular tenderness present.  Cardiovascular: Normal rate and regular rhythm.  Pulmonary/Chest: Effort normal and breath sounds normal.  Abdominal: Soft. There is no tenderness.  Musculoskeletal:       Left wrist: She exhibits tenderness and swelling. She exhibits normal range of motion, no deformity and no laceration.       Lumbar back: She exhibits tenderness and spasm. Decreased range of motion: due to pain.       Right upper arm: She exhibits tenderness. She exhibits no deformity and no laceration.  Radial pulses 2+, adequate circulation, grips are  equal.   Neurological: She is alert and oriented to person, place, and time. No cranial nerve deficit.  Skin: Skin is warm and dry.  Psychiatric: She has a normal mood and affect. Her behavior is normal.  Nursing note and vitals reviewed.    ED Treatments / Results  Labs (all labs ordered are listed, but only abnormal results are displayed) Labs Reviewed  URINALYSIS, ROUTINE W REFLEX MICROSCOPIC - Abnormal; Notable for the following components:      Result Value   Color, Urine AMBER (*)    APPearance HAZY (*)    Glucose, UA >=500 (*)    Protein, ur 30 (*)    Nitrite POSITIVE (*)    Bacteria, UA MANY (*)    Squamous Epithelial / LPF 0-5 (*)    All other  components within normal limits  URINE CULTURE  POC URINE PREG, ED   DISCUSSED WITH PATIENT URINE RESULTS. PATIENT STATES SHE HAS RX FOR UTI AND WILL FILL IT TODAY AND START IT. THE RX IS FOR CIPRO.  Discussed elevated BP and need for follow up.  Radiology Dg Lumbar Spine Complete  Result Date: 02/06/2017 CLINICAL DATA:  Pt fell at [employer] today causing lower back pain, right humerus pain, and left wrist pain EXAM: LUMBAR SPINE - COMPLETE 4+ VIEW COMPARISON:  10/21/2016 MRI, 04/28/2016 CT, 03/25/2015 lumbar spine films FINDINGS: There is no evidence of lumbar spine fracture. Alignment is normal. Intervertebral disc spaces are maintained. IMPRESSION: Negative. Electronically Signed   By: Norva Pavlov M.D.   On: 02/06/2017 16:18   Dg Shoulder Right  Result Date: 02/06/2017 CLINICAL DATA:  Fall, right shoulder pain EXAM: RIGHT SHOULDER - 2+ VIEW COMPARISON:  None. FINDINGS: Degenerative changes in the [facility] joint with joint space narrowing and spurring. Glenohumeral joint is maintained. No acute bony abnormality. Specifically, no fracture, subluxation, or dislocation. Soft tissues are intact. IMPRESSION: Degenerative changes in the right AC joint. No acute bony abnormality. Electronically Signed   By: Charlett Nose M.D.   On: 02/06/2017  18:05   Dg Wrist Complete Left  Result Date: 02/06/2017 CLINICAL DATA:  Fall, wrist pain EXAM: LEFT WRIST - COMPLETE 3+ VIEW COMPARISON:  None. FINDINGS: There is no evidence of fracture or dislocation. There is no evidence of arthropathy or other focal bone abnormality. Soft tissues are unremarkable. IMPRESSION: Negative. Electronically Signed   By: Charlett Nose M.D.   On: 02/06/2017 16:15   Dg Humerus Right  Result Date: 02/06/2017 CLINICAL DATA:  Fall today.  Right upper extremity pain. EXAM: RIGHT HUMERUS - 2+ VIEW COMPARISON:  None. FINDINGS: No right humerus fracture. Questionable mild lucency and cortical irregularity in the acromion near the acromioclavicular joint. No suspicious focal osseous lesions. No evidence of dislocation at the right shoulder or right elbow on the provided views. No radiopaque foreign body. IMPRESSION: 1. No right humerus fracture. 2. Mild lucency and cortical irregularity in the acromion near the [facility] joint. This may be projectional/degenerative; dedicated right shoulder radiographs should be considered for further evaluation to exclude a nondisplaced right acromion fracture. Electronically Signed   By: Delbert Phenix M.D.   On: 02/06/2017 16:17    Procedures Procedures (including critical care time)  Medications Ordered in ED Medications  cyclobenzaprine (FLEXERIL) tablet 10 mg (10 mg Oral Given 02/06/17 1450)  HYDROcodone-acetaminophen (NORCO/VICODIN) 5-325 MG per tablet 1 tablet (1 tablet Oral Given 02/06/17 1450)     Initial Impression / Assessment and Plan / ED Course  I have reviewed the triage vital signs and the nursing notes.  Radiology without acute abnormality.  Patient is able to ambulate without difficulty in the ED.  Pt is hemodynamically stable, in NAD.   Pain has been managed & pt has no complaints prior to dc.  Patient counseled on typical course of muscle stiffness and soreness post-Fall. Discussed s/s that should cause them to return. Instructed that  prescribed medicine can cause drowsiness and they should not work, drink alcohol, or drive while taking this medicine. Encouraged PCP follow-up for recheck if symptoms are not improved in one week.. Patient verbalized understanding and agreed with the plan. D/c to home Wrist splint given in the ED.  Discussed elevated BP and need for f/u. Patient agrees.   Final Clinical Impressions(s) / ED Diagnoses   Final diagnoses:  Facial  contusion, initial encounter  Fall, initial encounter  Left wrist sprain, initial encounter  Injury of low back, initial encounter  Acute pain of right shoulder    ED Discharge Orders        Ordered    cyclobenzaprine (FLEXERIL) 10 MG tablet  2 times daily PRN     02/06/17 1820       Kerrie [place], NP 02/06/17 2142    Azalia Bilis, MD 02/08/17 [id number]

## 2017-02-06 NOTE — ED Notes (Signed)
Patient transported to X-ray 

## 2017-02-06 NOTE — ED Triage Notes (Signed)
Pt c/p right side of face pain, right shoulder and upper arm pain, left wrist pain, mid to lower back pain and pains and under breast after falling when tri[facility] [employer]

## 2017-02-06 NOTE — Discharge Instructions (Signed)
Do not drive while taking the medication as it will make you sleepy. Follow up with your primary care doctor. Return here as needed.

## 2017-02-09 LAB — URINE CULTURE: Culture: >=100000 — AB

## 2017-02-10 ENCOUNTER — Telehealth: Payer: Self-pay | Admitting: *Deleted

## 2017-02-10 NOTE — Telephone Encounter (Signed)
Post ED Visit - Positive Culture Follow-up  Culture report reviewed by antimicrobial stewardship pharmacist:  []  Enzo BiNathan Batchelder, Pharm.D. []  Celedonio MiyamotoJeremy Frens, Pharm.D., BCPS AQ-ID [x]  Garvin FilaMike Maccia, Pharm.D., BCPS []  Georgina PillionElizabeth Martin, Pharm.D., BCPS []  CarrsvilleMinh Pham, VermontPharm.D., BCPS, AAHIVP []  Estella HuskMichelle Turner, Pharm.D., BCPS, AAHIVP []  Lysle Pearlachel Rumbarger, PharmD, BCPS []  Blake DivineShannon Parkey, PharmD []  Pollyann SamplesAndy Johnston, PharmD, BCPS  Positive urine culture Treated with Cipro, organism sensitive to the same and no further patient follow-up is required at this time.  Virl AxeRobertson, Frederich Montilla [facility]astic [facility] Reconstruct[facility] Surgeons[facility]ley 02/10/2017, 1:15 PM

## 2017-02-13 ENCOUNTER — Other Ambulatory Visit: Payer: Self-pay | Admitting: Family Medicine

## 2017-02-13 DIAGNOSIS — N6452 Nipple discharge: Secondary | ICD-10-CM

## 2017-06-06 ENCOUNTER — Emergency Department (HOSPITAL_COMMUNITY): Payer: Medicaid Other

## 2017-06-06 ENCOUNTER — Encounter (HOSPITAL_COMMUNITY): Payer: Self-pay

## 2017-06-06 ENCOUNTER — Emergency Department (HOSPITAL_COMMUNITY)
Admission: EM | Admit: 2017-06-06 | Discharge: 2017-06-07 | Disposition: A | Discharge status: Home / Self Care | Payer: Medicaid Other | Attending: Emergency Medicine | Admitting: Emergency Medicine

## 2017-06-06 ENCOUNTER — Other Ambulatory Visit: Payer: Self-pay

## 2017-06-06 DIAGNOSIS — I10 Essential (primary) hypertension: Secondary | ICD-10-CM | POA: Insufficient documentation

## 2017-06-06 DIAGNOSIS — Z794 Long term (current) use of insulin: Secondary | ICD-10-CM | POA: Diagnosis not present

## 2017-06-06 DIAGNOSIS — J449 Chronic obstructive pulmonary disease, unspecified: Secondary | ICD-10-CM | POA: Insufficient documentation

## 2017-06-06 DIAGNOSIS — E119 Type 2 diabetes mellitus without complications: Secondary | ICD-10-CM | POA: Diagnosis not present

## 2017-06-06 DIAGNOSIS — N939 Abnormal uterine and vaginal bleeding, unspecified: Secondary | ICD-10-CM | POA: Insufficient documentation

## 2017-06-06 DIAGNOSIS — Z79899 Other long term (current) drug therapy: Secondary | ICD-10-CM | POA: Insufficient documentation

## 2017-06-06 DIAGNOSIS — L03011 Cellulitis of right finger: Secondary | ICD-10-CM | POA: Diagnosis not present

## 2017-06-06 LAB — I-STAT BETA HCG BLOOD, ED (MC, WL, AP ONLY): I-stat hCG, quantitative: <5 m[IU]/mL (ref <5)

## 2017-06-06 LAB — CBC WITH DIFFERENTIAL/PLATELET
ABS IMMATURE GRANULOCYTES: 0.1 10*3/uL (ref 0.0–0.1)
BASOS ABS: 0.1 10*3/uL (ref 0.0–0.1)
BASOS PCT: 1 %
EOS ABS: 0.1 10*3/uL (ref 0.0–0.7)
Eosinophils Relative: 2 %
HCT: 33.7 % — ABNORMAL LOW (ref 36.0–46.0)
Hemoglobin: 11.1 g/dL — ABNORMAL LOW (ref 12.0–15.0)
IMMATURE GRANULOCYTES: 1 %
Lymphocytes Relative: 31 %
Lymphs Abs: 2.4 10*3/uL (ref 0.7–4.0)
MCH: 29.9 pg (ref 26.0–34.0)
MCHC: 32.9 g/dL (ref 30.0–36.0)
MCV: 90.8 fL (ref 78.0–100.0)
Monocytes Absolute: 0.8 10*3/uL (ref 0.1–1.0)
Monocytes Relative: 10 %
NEUTROS ABS: 4.3 10*3/uL (ref 1.7–7.7)
Neutrophils Relative %: 55 %
PLATELETS: 122 10*3/uL — AB (ref 150–400)
RBC: 3.71 MIL/uL — ABNORMAL LOW (ref 3.87–5.11)
RDW: 13.1 % (ref 11.5–15.5)
WBC: 7.7 10*3/uL (ref 4.0–10.5)

## 2017-06-06 LAB — URINALYSIS, ROUTINE W REFLEX MICROSCOPIC
BACTERIA UA: NONE SEEN
BILIRUBIN URINE: NEGATIVE
Glucose, UA: >=500 mg/dL — AB
Hgb urine dipstick: NEGATIVE
KETONES UR: NEGATIVE mg/dL
LEUKOCYTES UA: NEGATIVE
NITRITE: NEGATIVE
PH: 7 (ref 5.0–8.0)
Protein, ur: NEGATIVE mg/dL
Specific Gravity, Urine: 1.02 (ref 1.005–1.030)

## 2017-06-06 LAB — COMPREHENSIVE METABOLIC PANEL
ALK PHOS: 193 U/L — AB (ref 38–126)
ALT: 58 U/L — ABNORMAL HIGH (ref 14–54)
ANION GAP: 8 (ref 5–15)
AST: 64 U/L — ABNORMAL HIGH (ref 15–41)
Albumin: 2.5 g/dL — ABNORMAL LOW (ref 3.5–5.0)
BUN: 6 mg/dL (ref 6–20)
CALCIUM: 8.3 mg/dL — AB (ref 8.9–10.3)
CO2: 24 mmol/L (ref 22–32)
CREATININE: 0.89 mg/dL (ref 0.44–1.00)
Chloride: 102 mmol/L (ref 101–111)
GFR calc Af Amer: >60 mL/min (ref >60)
Glucose, Bld: 423 mg/dL — ABNORMAL HIGH (ref 65–99)
Potassium: 3.4 mmol/L — ABNORMAL LOW (ref 3.5–5.1)
SODIUM: 134 mmol/L — AB (ref 135–145)
TOTAL PROTEIN: 8.4 g/dL — AB (ref 6.5–8.1)
Total Bilirubin: 0.8 mg/dL (ref 0.3–1.2)

## 2017-06-06 LAB — WET PREP, GENITAL
Sperm: NONE SEEN
Trich, Wet Prep: NONE SEEN
YEAST WET PREP: NONE SEEN

## 2017-06-06 LAB — CBG MONITORING, ED: GLUCOSE-CAPILLARY: 268 mg/dL — AB (ref 65–99)

## 2017-06-06 MED ORDER — CEPHALEXIN 500 MG PO CAPS: 500.0000 mg | ORAL_CAPSULE | Freq: Four times a day (QID) | ORAL | 0 refills | Status: AC

## 2017-06-06 MED ORDER — LIDOCAINE HCL (PF) 1 % IJ SOLN
30.0000 mL | Freq: Once | INTRAMUSCULAR | Status: AC
  Administered 2017-06-06: 30 mL
  Filled 2017-06-06: qty 30

## 2017-06-06 MED ORDER — OXYCODONE HCL 5 MG PO TABS
10.0000 mg | ORAL_TABLET | Freq: Once | ORAL | Status: AC
  Administered 2017-06-06: 10 mg via ORAL
  Filled 2017-06-06: qty 2

## 2017-06-06 NOTE — ED Triage Notes (Signed)
Pt states that her last menstrual was 2 weeks ago and reports that the whole 7 days she had blood clots. Pt also reports that since she has been having bleeding off and on typically after sex. She is also concerned that she is having a herpes outbreak. Pt also c/o right hand pain, reports she bit off a hangnail and now her finger is swollen.

## 2017-06-06 NOTE — ED Provider Notes (Signed)
[facility] EMERGENCY DEPARTMENT Provider Note   CSN: [id number] Arrival date & time: 06/06/17  1421     History   Chief Complaint Chief Complaint  Patient presents with  . Vaginal Bleeding    HPI Sheila Gibson is a 47 y.o. female with a hx of COPD, depression, IDDM, cirrhosis, pancreatitis, gallstones, hepatitis, HTN, thyroid disease presents to the Emergency Department complaining of gradual, persistent, progressively worsening lower and pain onset 4-6 weeks ago. Associated symptoms include vaginal bleeding.  Nothing makes her pain better and intercourse makes it worse.  Pt reports she has bleeding with every sexual intercourse for 1 year.  She reports the bleeding usually lasts approximately 2 hours and consists of clots and then resolves.  She reports her last episode of bleeding was 1 week ago.  She has had no bleeding since.  She reports her partner is "large" and she had never had bleeding until she started having intercourse with this partner.  She reports this happens every time.  Pt reports she also has pain with intercourse.  Patient reports she does not have a gynecologist and does not know when her last Pap smear was.  Patient reports a history of C-section x2 but no additional abdominal surgeries.  Additionally, patient complains of swelling and pain to the tip of her right finger.  She reports she bites her nails regularly and has often had infections at her fingernail.  No treatments prior to arrival.  Palpation and movement make her symptoms worse.  Nothing makes them better.  She denies fever, chills, nausea, vomiting, diarrhea, weakness, dizziness, syncope, dysuria.  Pt reports she has been taking all of her medication as directed.  Pt reports CBG normally runs 115-200 and her last insulin shot was 6am.  Pt reports she has been stressed recently and is hurting.  She reports she is not taking any HTN medications as it is only high when she is upset.  She  also reports eating soup this afternoon with 2 bottles of soy sauce.     The history is provided by the patient and medical records. No language interpreter was used.    Past Medical History:  Diagnosis Date  . COPD (chronic obstructive pulmonary disease) (HCC)   . Depression   . Diabetes mellitus without complication (HCC)   . Heart murmur   . Hepatic cirrhosis (HCC)   . Hypertension   . Obesity   . Thyroid disease     There are no active problems to display for this patient.   Past Surgical History:  Procedure Laterality Date  . CESAREAN SECTION    . THYROIDECTOMY       OB History   None      Home Medications    Prior to Admission medications   Medication Sig Start Date End Date Taking? Authorizing Provider  albuterol (PROVENTIL HFA;VENTOLIN HFA) 108 (90 BASE) MCG/ACT inhaler Inhale 1-2 puffs into the lungs every 6 (six) hours as needed for wheezing or shortness of breath.   Yes [provider]  gabapentin (NEURONTIN) 300 MG capsule Take 300 mg by mouth 3 (three) times daily.    Yes [provider]  ibuprofen (ADVIL,MOTRIN) 800 MG tablet Take 800 mg by mouth every 8 (eight) hours as needed for moderate pain.   Yes [provider]  insulin glargine (LANTUS) 100 unit/mL SOPN Inject 60 Units into the skin 2 (two) times daily.    Yes [provider]  tiZANidine (ZANAFLEX) 4 MG  tablet Take 4 mg by mouth 3 (three) times daily as needed for muscle spasms.    Yes [provider]  zolpidem (AMBIEN) 10 MG tablet Take 10 mg by mouth at bedtime as needed for sleep.  01/23/17  Yes [provider]  cephALEXin (KEFLEX) 500 MG capsule Take 1 capsule (500 mg total) by mouth 4 (four) times daily for 5 days. 06/06/17 06/11/17  Jayin Derousse, Dahlia ClientHannah, PA-C    Family History Family History  Problem Relation Age of Onset  . Cancer Mother   . Hypertension Other     Social History Social History   Tobacco Use  . Smoking status: Never  Smoker  . Smokeless tobacco: Never Used  Substance Use Topics  . Alcohol use: Yes    Comment: occasional drink  . Drug use: No     Allergies   Patient has no known allergies.   Review of Systems Review of Systems  Constitutional: Negative for appetite change, diaphoresis, fatigue, fever and unexpected weight change.  HENT: Negative for mouth sores.   Eyes: Negative for visual disturbance.  Respiratory: Negative for cough, chest tightness, shortness of breath and wheezing.   Cardiovascular: Negative for chest pain.  Gastrointestinal: Positive for abdominal pain. Negative for constipation, diarrhea, nausea and vomiting.  Endocrine: Negative for polydipsia, polyphagia and polyuria.  Genitourinary: Positive for pelvic pain, vaginal bleeding and vaginal pain. Negative for dysuria, frequency, hematuria and urgency.  Musculoskeletal: Positive for arthralgias ( finger). Negative for back pain and neck stiffness.  Skin: Positive for color change and wound. Negative for rash.  Allergic/Immunologic: Negative for immunocompromised state.  Neurological: Negative for syncope, light-headedness and headaches.  Hematological: Does not bruise/bleed easily.  Psychiatric/Behavioral: Negative for sleep disturbance. The patient is not nervous/anxious.      Physical Exam Updated Vital Signs BP (!) 166/75   Pulse 83   Temp 98.3 F (36.8 C) (Oral)   Resp 16   Ht 5\' 7"  (1.702 m)   Wt 117.9 kg (260 lb)   LMP 05/25/2017   SpO2 100%   BMI 40.72 kg/m   Physical Exam  Constitutional: She appears well-developed and well-nourished. No distress.  HENT:  Head: Normocephalic and atraumatic.  Eyes: Conjunctivae are normal.  Neck: Normal range of motion.  Cardiovascular: Normal rate, regular rhythm, normal heart sounds and intact distal pulses.  No murmur heard. Pulmonary/Chest: Effort normal and breath sounds normal. No respiratory distress. She has no wheezes.  Abdominal: Soft. Bowel sounds are  normal. There is no tenderness. There is no rebound and no guarding. Hernia confirmed negative in the right inguinal area and confirmed negative in the left inguinal area.  Genitourinary: Uterus normal. No labial fusion. There is no rash, tenderness or lesion on the right labia. There is no rash, tenderness or lesion on the left labia. Uterus is not deviated, not enlarged, not fixed and not tender. Cervix exhibits no motion tenderness, no discharge and no friability. Right adnexum displays no mass, no tenderness and no fullness. Left adnexum displays no mass, no tenderness and no fullness. No erythema, tenderness or bleeding in the vagina. No foreign body in the vagina. No signs of injury around the vagina. Vaginal discharge (scant, green at the cervical os) found.  Genitourinary Comments: No lesions to the vaginal walls or cervical loss. Small genital wart noted at the 3 o'clock position on the labia majora.  No evidence of herpetic lesions.  Musculoskeletal: Normal range of motion. She exhibits no edema.  Lymphadenopathy:  Right: No inguinal adenopathy present.       Left: No inguinal adenopathy present.  Neurological: She is alert.  Skin: Skin is warm and dry. She is not diaphoretic. No erythema.  Right pointer finger with swelling, erythema and purulence under the skin around the nailbed.  Pad of the finger is soft and nontender  Psychiatric: She has a normal mood and affect.  Nursing note and vitals reviewed.    ED Treatments / Results  Labs (all labs ordered are listed, but only abnormal results are displayed) Labs Reviewed  WET PREP, GENITAL - Abnormal; Notable for the following components:      Result Value   Clue Cells Wet Prep HPF POC PRESENT (*)    WBC, Wet Prep HPF POC MANY (*)    All other components within normal limits  COMPREHENSIVE METABOLIC PANEL - Abnormal; Notable for the following components:   Sodium 134 (*)    Potassium 3.4 (*)    Glucose, Bld 423 (*)     Calcium 8.3 (*)    Total Protein 8.4 (*)    Albumin 2.5 (*)    AST 64 (*)    ALT 58 (*)    Alkaline Phosphatase 193 (*)    All other components within normal limits  CBC WITH DIFFERENTIAL/PLATELET - Abnormal; Notable for the following components:   RBC 3.71 (*)    Hemoglobin 11.1 (*)    HCT 33.7 (*)    Platelets 122 (*)    All other components within normal limits  URINALYSIS, ROUTINE W REFLEX MICROSCOPIC - Abnormal; Notable for the following components:   APPearance HAZY (*)    Glucose, UA >=500 (*)    All other components within normal limits  CBG MONITORING, ED - Abnormal; Notable for the following components:   Glucose-Capillary 268 (*)    All other components within normal limits  I-STAT BETA HCG BLOOD, ED ([facility], [facility], [facility] ONLY)  GC/CHLAMYDIA PROBE AMP ([place]) NOT AT [facility]     Radiology US Transvaginal Non-ob  Result Date: 06/06/2017 CLINICAL DATA:  Vaginal bleeding for 2 weeks, pelvic pain. Last menstrual period May 21, 2017. EXAM: TRANSABDOMINAL AND TRANSVAGINAL ULTRASOUND OF PELVIS DOPPLER ULTRASOUND OF OVARIES TECHNIQUE: Both transabdominal and transvaginal ultrasound examinations of the pelvis were performed. Transabdominal technique was performed for global imaging of the pelvis including uterus, ovaries, adnexal regions, and pelvic cul-de-sac. It was necessary to proceed with endovaginal exam following the transabdominal exam to visualize the adnexa and endometrium. Color and duplex Doppler ultrasound was utilized to evaluate blood flow to the ovaries. COMPARISON:  None. FINDINGS: Uterus Measurements: 9 x 5.6 x 5.7 cm. No fibroids or other mass visualized. Endometrium Thickness: 12 mm.  No focal abnormality visualized. Right ovary Measurements: 2.6 x 1.9 x 2.4 cm. Normal appearance/no adnexal mass. Left ovary Measurements: 3.2 x 2.5 x 2.2 cm. Normal appearance/no adnexal mass. Pulsed Doppler evaluation of both ovaries demonstrates relatively normal low-resistance arterial and  venous waveforms though limited evaluation due to habitus and location of ovaries. Other findings Trace free fluid may be physiologic. IMPRESSION: 1. Normal pelvic ultrasound. 2. If bleeding remains unresponsive to hormonal or medical therapy, sonohysterogram should be considered for focal lesion work-up. (Ref: Radiological Reasoning: Algorithmic Workup of Abnormal Vaginal Bleeding with Endovaginal Sonography and Sonohysterography. AJR 2008; 161:W96-04) Electronically Signed   By: Awilda Metro M.D.   On: 06/06/2017 22:13   US Pelvis Complete  Result Date: 06/06/2017 CLINICAL DATA:  Vaginal bleeding for 2 weeks, pelvic pain.  Last menstrual period May 21, 2017. EXAM: TRANSABDOMINAL AND TRANSVAGINAL ULTRASOUND OF PELVIS DOPPLER ULTRASOUND OF OVARIES TECHNIQUE: Both transabdominal and transvaginal ultrasound examinations of the pelvis were performed. Transabdominal technique was performed for global imaging of the pelvis including uterus, ovaries, adnexal regions, and pelvic cul-de-sac. It was necessary to proceed with endovaginal exam following the transabdominal exam to visualize the adnexa and endometrium. Color and duplex Doppler ultrasound was utilized to evaluate blood flow to the ovaries. COMPARISON:  None. FINDINGS: Uterus Measurements: 9 x 5.6 x 5.7 cm. No fibroids or other mass visualized. Endometrium Thickness: 12 mm.  No focal abnormality visualized. Right ovary Measurements: 2.6 x 1.9 x 2.4 cm. Normal appearance/no adnexal mass. Left ovary Measurements: 3.2 x 2.5 x 2.2 cm. Normal appearance/no adnexal mass. Pulsed Doppler evaluation of both ovaries demonstrates relatively normal low-resistance arterial and venous waveforms though limited evaluation due to habitus and location of ovaries. Other findings Trace free fluid may be physiologic. IMPRESSION: 1. Normal pelvic ultrasound. 2. If bleeding remains unresponsive to hormonal or medical therapy, sonohysterogram should be considered for focal lesion  work-up. (Ref: Radiological Reasoning: Algorithmic Workup of Abnormal Vaginal Bleeding with Endovaginal Sonography and Sonohysterography. AJR 2008; 161:W96-04) Electronically Signed   By: Awilda Metro M.D.   On: 06/06/2017 22:13   [place] Art/ven Flow Abd Pelv Doppler  Result Date: 06/06/2017 CLINICAL DATA:  Vaginal bleeding for 2 weeks, pelvic pain. Last menstrual period May 21, 2017. EXAM: TRANSABDOMINAL AND TRANSVAGINAL ULTRASOUND OF PELVIS DOPPLER ULTRASOUND OF OVARIES TECHNIQUE: Both transabdominal and transvaginal ultrasound examinations of the pelvis were performed. Transabdominal technique was performed for global imaging of the pelvis including uterus, ovaries, adnexal regions, and pelvic cul-de-sac. It was necessary to proceed with endovaginal exam following the transabdominal exam to visualize the adnexa and endometrium. Color and duplex Doppler ultrasound was utilized to evaluate blood flow to the ovaries. COMPARISON:  None. FINDINGS: Uterus Measurements: 9 x 5.6 x 5.7 cm. No fibroids or other mass visualized. Endometrium Thickness: 12 mm.  No focal abnormality visualized. Right ovary Measurements: 2.6 x 1.9 x 2.4 cm. Normal appearance/no adnexal mass. Left ovary Measurements: 3.2 x 2.5 x 2.2 cm. Normal appearance/no adnexal mass. Pulsed Doppler evaluation of both ovaries demonstrates relatively normal low-resistance arterial and venous waveforms though limited evaluation due to habitus and location of ovaries. Other findings Trace free fluid may be physiologic. IMPRESSION: 1. Normal pelvic ultrasound. 2. If bleeding remains unresponsive to hormonal or medical therapy, sonohysterogram should be considered for focal lesion work-up. (Ref: Radiological Reasoning: Algorithmic Workup of Abnormal Vaginal Bleeding with Endovaginal Sonography and Sonohysterography. AJR 2008; 540:J81-19) Electronically Signed   By: Awilda Metro M.D.   On: 06/06/2017 22:13    Procedures .Marland KitchenIncision and  Drainage Date/Time: 06/07/2017 12:06 AM Performed by: Dierdre Forth, PA-C Authorized by: Dierdre Forth, PA-C   Consent:    Consent obtained:  Verbal   Consent given by:  Patient   Risks discussed:  Bleeding, incomplete drainage, infection and pain   Alternatives discussed:  No treatment Location:    Type:  Abscess   Location:  Upper extremity   Upper extremity location:  Finger   Finger location:  R index finger Pre-procedure details:    Skin preparation:  Betadine Anesthesia (see MAR for exact dosages):    Anesthesia method:  Nerve block   Block needle gauge:  25 G   Block anesthetic:  Lidocaine 1% w/o epi (5mL)   Block injection procedure:  Anatomic landmarks identified, introduced needle, incremental injection, negative aspiration  for blood and anatomic landmarks palpated   Block outcome:  Anesthesia achieved Procedure type:    Complexity:  Simple Procedure details:    Incision types:  Single straight   Scalpel blade:  11   Wound management:  Probed and deloculated and irrigated with saline   Drainage:  Bloody and purulent   Drainage amount:  Moderate   Wound treatment:  Wound left open   Packing materials:  None Post-procedure details:    Patient tolerance of procedure:  Tolerated well, no immediate complications   (including critical care time)  Medications Ordered in ED Medications  lidocaine (PF) (XYLOCAINE) 1 % injection 30 mL (30 mLs Infiltration Given by Other 06/06/17 2115)  oxyCODONE (Oxy IR/ROXICODONE) immediate release tablet 10 mg (10 mg Oral Given 06/06/17 2115)     Initial Impression / Assessment and Plan / ED Course  I have reviewed the triage vital signs and the nursing notes.  Pertinent labs & imaging results that were available during my care of the patient were reviewed by me and considered in my medical decision making (see chart for details).  Clinical Course as of Jun 07 13  Sun Jun 07, 2017  0013 No evidence of urinary tract  infection  Nitrite: NEGATIVE [HM]  0014 No leukocytosis  WBC: 7.7 [HM]  0014 Anemia appears to be baseline  Hemoglobin(!): 11.1 [HM]  0014 Elevation in AST and ALT are baseline.  AST(!): 64 [HM]  0014 Elevated glucose without ketones in her urine or anion gap.  No evidence of DKA.  Patient reports that is been many hours since her last insulin shot.  Glucose(!): 423 [HM]  0014 Sugar improved while here in the emergency department.  Glucose-Capillary(!): 268 [HM]    Clinical Course User Index [HM] Kenly Xiao, Dahlia Client, PA-C    Patient presents with complaints of abdominal pain and vaginal bleeding with sexual intercourse.  On exam, no lacerations or open lesions to the vaginal vault or cervical loss.  Small amount of vaginal discharge noted at the cervical office but no cervical motion or adnexal tenderness.  Pelvic ultrasound obtained without clear evidence of uterine wall thickening or ovarian cysts.  Patient does have a small lesion to the left labia consistent with genital warts.  No evidence of herpes outbreak.  Discussed all this with the patient.  She will need close OB/GYN follow-up to determine the cause of her vaginal bleeding with sexual intercourse.  I have recommended that patient refrain from intercourse until this time.    Patient with many white blood cells on her wet prep however reports she is monogamous with one partner and believe she is low risk for STDs.  Gonorrhea and Chlamydia cultures sent.  Patient will be notified these are positive.  No treatment today.  Additionally, patient with right pointer finger paronychia.  Incision and drainage without complication.  Patient will be given short course of antibiotics.  Discussed home care of the wound with patient.  She states understanding and is in agreement with the plan.  Final Clinical Impressions(s) / ED Diagnoses   Final diagnoses:  Vaginal bleeding  Paronychia of finger of right hand    ED Discharge Orders         Ordered    cephALEXin (KEFLEX) 500 MG capsule  4 times daily     06/06/17 2359       Chairty Toman, Dahlia Client, PA-C 06/07/17 0015    Gwyneth Sprout, MD 06/07/17 2131

## 2017-06-06 NOTE — ED Notes (Signed)
Pt ambulated to bathroom independently

## 2017-06-06 NOTE — ED Notes (Signed)
Patient transported to Ultrasound 

## 2017-06-07 NOTE — Discharge Instructions (Addendum)
1. Medications: Keflex, usual home medications 2. Treatment: rest, drink plenty of fluids, warm water soaks 3x per day, keep area clean with warm soap and water 3. Follow Up: Please followup with OB/GYN in 3-5 days for discussion of your diagnoses and further evaluation after today's visit; if you do not have a primary care doctor use the resource guide provided to find one; Please return to the ER for worsening pain, heavy bleeding, worsening infection or other concerns

## 2017-06-07 NOTE — ED Notes (Signed)
Pt verbalizes understanding of d/c instructions. Pt received prescriptions. Pt ambulatory at d/c with all belongings.  

## 2017-06-08 LAB — GC/CHLAMYDIA PROBE AMP (~~LOC~~) NOT AT ARMC
Chlamydia: NEGATIVE
Neisseria Gonorrhea: NEGATIVE

## 2017-07-06 ENCOUNTER — Other Ambulatory Visit: Payer: Self-pay | Admitting: Gastroenterology

## 2017-07-07 ENCOUNTER — Other Ambulatory Visit: Payer: Self-pay | Admitting: Gastroenterology

## 2017-07-07 DIAGNOSIS — K7469 Other cirrhosis of liver: Secondary | ICD-10-CM

## 2017-07-08 ENCOUNTER — Ambulatory Visit: Payer: Medicaid Other | Admitting: Obstetrics & Gynecology

## 2017-07-21 ENCOUNTER — Other Ambulatory Visit: Payer: Medicaid Other

## 2017-07-21 ENCOUNTER — Ambulatory Visit (INDEPENDENT_AMBULATORY_CARE_PROVIDER_SITE_OTHER): Payer: Medicaid Other | Admitting: Obstetrics and Gynecology

## 2017-07-21 ENCOUNTER — Other Ambulatory Visit (HOSPITAL_COMMUNITY)
Admission: RE | Admit: 2017-07-21 | Discharge: 2017-07-21 | Disposition: A | Discharge status: Home / Self Care | Payer: Medicaid Other | Source: Ambulatory Visit | Attending: Obstetrics and Gynecology | Admitting: Obstetrics and Gynecology

## 2017-07-21 ENCOUNTER — Encounter: Payer: Self-pay | Admitting: Obstetrics and Gynecology

## 2017-07-21 VITALS — BP 165/91 | HR 93 | Ht 67.0 in | Wt 314.1 lb

## 2017-07-21 DIAGNOSIS — R1030 Lower abdominal pain, unspecified: Secondary | ICD-10-CM

## 2017-07-21 DIAGNOSIS — Z01419 Encounter for gynecological examination (general) (routine) without abnormal findings: Secondary | ICD-10-CM

## 2017-07-21 DIAGNOSIS — N930 Postcoital and contact bleeding: Secondary | ICD-10-CM | POA: Diagnosis not present

## 2017-07-21 LAB — POCT URINALYSIS DIPSTICK
Glucose, UA: POSITIVE — AB
KETONES UA: NEGATIVE
LEUKOCYTES UA: NEGATIVE
NITRITE UA: NEGATIVE
PROTEIN UA: POSITIVE — AB
RBC UA: NEGATIVE
SPEC GRAV UA: 1.01 (ref 1.010–1.025)
UROBILINOGEN UA: 2 U/dL — AB
pH, UA: 6.5 (ref 5.0–8.0)

## 2017-07-21 NOTE — Progress Notes (Addendum)
[facility] - Clinic Note  07/22/2017     CHIEF COMPLAINT Patient presents for Retina Evaluation and Diabetic Eye Exam   HISTORY OF PRESENT ILLNESS: Sheila Gibson is a 47 y.o. female who presents to the clinic today for:   HPI    Retina Evaluation    In both eyes.  This started 2 years ago.  Associated Symptoms Flashes and Photophobia.  Negative for Floaters, Distortion, Redness, Trauma, Shoulder/Hip pain, Fatigue, Weight Loss, Jaw Claudication, Glare, Pain, Blind Spot, Scalp Tenderness and Fever.  Context:  distance vision, mid-range vision and near vision.  Treatments tried include no treatments.  I, the attending physician,  performed the HPI with the patient and updated documentation appropriately.          Diabetic Eye Exam    Vision is stable.  Associated Symptoms Negative for Distortion, Redness, Trauma, Shoulder/Hip pain, Fatigue, Weight Loss, Jaw Claudication, Glare, Pain, Floaters, Flashes, Blind Spot, Photophobia, Scalp Tenderness and Fever.  Diabetes characteristics include Type 2 and on insulin.  This started 11 years ago.  Blood sugar level is controlled.  Last Blood Glucose 120.  Associated Diagnosis Neuropathy.  I, the attending physician,  performed the HPI with the patient and updated documentation appropriately.          Comments    Referral of DR Karleen Hampshire for DME. Patient states she has had "blurry vision and flicking lights in right eye for appx two years", she also wears sunglasses when outside due to light sensitivity.Pt states her eyes have been itching lately.  Pt is DM2 x 11 years,  BS 120 this am, A1C (unknown results) high per patient, BS medication changed and are usually WINL since new medication, BS controlled by insulin and glipizide, denies visual changes with Bs. Denies vit's/gtt's       Last edited by Rennis Chris, MD on 07/22/2017  3:01 PM. (History)    Pt states she was seen by Dr. Karleen Hampshire for routine exam; Pt states she  was referred to him by NP at a drug rehab facility; Pt states she has been having itching and tearing OS;   Referring physician: Aura Camps, MD [address] [place], [place] [address]  HISTORICAL INFORMATION:   Selected notes from the MEDICAL RECORD NUMBER Referred by Dr. Aura Camps for concern of PDR and CSME OU LEE: 06.24.19 Kirk Ruths) [BCVA: OD: 20/60-2 OS: 20/25] Ocular Hx-DME OU, PDR OU, cataract OU PMH-DM (taking Lantus and Novolog), Thyroid disease, HTN    CURRENT MEDICATIONS: No current outpatient medications on file. (Ophthalmic Drugs)   No current facility-administered medications for this visit.  (Ophthalmic Drugs)   Current Outpatient Medications (Other)  Medication Sig  . albuterol (PROVENTIL HFA;VENTOLIN HFA) 108 (90 BASE) MCG/ACT inhaler Inhale 1-2 puffs into the lungs every 6 (six) hours as needed for wheezing or shortness of breath.  . gabapentin (NEURONTIN) 300 MG capsule Take 300 mg by mouth 3 (three) times daily.   Marland Kitchen ibuprofen (ADVIL,MOTRIN) 800 MG tablet Take 800 mg by mouth every 8 (eight) hours as needed for moderate pain.  Marland Kitchen insulin glargine (LANTUS) 100 unit/mL SOPN Inject 60 Units into the skin 2 (two) times daily.   Marland Kitchen levothyroxine (SYNTHROID, LEVOTHROID) 50 MCG tablet TAKE ONE TABLET IN THE MORNING ON AN EMPTY STOMACH  . tiZANidine (ZANAFLEX) 4 MG tablet Take 4 mg by mouth 3 (three) times daily as needed for muscle spasms.   Marland Kitchen venlafaxine XR (EFFEXOR-XR) 37.5 MG 24 hr  capsule Take 37.5 mg by mouth daily.  Marland Kitchen. zolpidem (AMBIEN) 10 MG tablet Take 10 mg by mouth at bedtime as needed for sleep.   Marland Kitchen. ZOVIRAX 5 % APPLY TO AFFECTED AREA(S) THREE TIMES A DAY  . acyclovir (ZOVIRAX) 400 MG tablet TAKE ONE TABLET BY MOUTH THREE TIMES A DAY. FOR 5 DAYS   Current Facility-Administered Medications (Other)  Medication Route  . Bevacizumab (AVASTIN) SOLN 1.25 mg Intravitreal      REVIEW OF SYSTEMS: ROS    Positive for: Endocrine, Eyes    Negative for: Constitutional, Gastrointestinal, Neurological, Skin, Genitourinary, Musculoskeletal, HENT, Cardiovascular, Respiratory, Psychiatric, Allergic/Imm, Heme/Lymph   Last edited by Eldridge ScotKendrick, Glenda, LPN on 9/60/45407/17/2019  2:12 PM. (History)       ALLERGIES No Known Allergies  PAST MEDICAL HISTORY Past Medical History:  Diagnosis Date  . COPD (chronic obstructive pulmonary disease) (HCC)   . Depression   . Diabetes mellitus without complication (HCC)   . Heart murmur   . Hepatic cirrhosis (HCC)   . Hypertension   . Obesity   . Thyroid disease    Past Surgical History:  Procedure Laterality Date  . CESAREAN SECTION    . THYROIDECTOMY      FAMILY HISTORY Family History  Problem Relation Age of Onset  . Cancer Mother   . Hypertension Other     SOCIAL HISTORY Social History   Tobacco Use  . Smoking status: Never Smoker  . Smokeless tobacco: Never Used  Substance Use Topics  . Alcohol use: Not Currently  . Drug use: No         OPHTHALMIC EXAM:  Base Eye Exam    Visual Acuity (Snellen - Linear)      Right Left   Dist [place] 20/150 20/70   Dist ph [place] 20/80 20/30       Tonometry (Tonopen, 1:57 PM)      Right Left   Pressure 18 16       Pupils      Dark Light Shape React APD   Right 4 3 Round Brisk None   Left 4 3 Round Brisk None       Visual Fields (Counting fingers)      Left Right    Full Full       Extraocular Movement      Right Left    Full, Ortho Full, Ortho       Neuro/Psych    Oriented x3:  Yes   Mood/Affect:  Normal       Dilation    Both eyes:  1.0% Mydriacyl, 2.5% Phenylephrine @ 1:57 PM        Slit Lamp and Fundus Exam    Slit Lamp Exam      Right Left   Lids/Lashes Dermatochalasis - upper lid Dermatochalasis - upper lid   Conjunctiva/Sclera Melanosis Melanosis   Cornea Arcus Arcus   Anterior Chamber Deep and quiet Deep and quiet   Iris Round and dilated Round and dilated   Lens 1+ Nuclear sclerosis, 2+ Cortical  cataract 1+ Nuclear sclerosis, 2+ Cortical cataract   Vitreous Trace Vitreous syneresis Trace Vitreous syneresis       Fundus Exam      Right Left   Disc Pink and Sharp Pink and Sharp   C/D Ratio 0.3 0.6   Macula Blunted foveal reflex, +central edema, Exudates, Intraretinal hemorrhage, CWS Blunted foveal reflex, mild thickening superior to fovea, scattered Microaneurysms, scattered Intraretinal hemorrhage   Vessels Dilated and Tortuous veins, +  beading, Copper wiring, ? Flat NV along arcades Dilated and Tortuous veins, +beading, Copper wiring, ? Flat NV along arcades   Periphery Attached, +IRH, +CWS Attached, 360 IRH and CWS superiorly and around nerve        Refraction    Manifest Refraction      Sphere Cylinder Axis Dist VA   Right -3.25 +1.50 108 20/50-2   Left -2.75 +0.75 075 20/25          IMAGING AND PROCEDURES  Imaging and Procedures for @TODAY @  OCT, Retina - OU - Both Eyes       Right Eye Quality was good. Central Foveal Thickness: 493. Progression has no prior data. Findings include abnormal foveal contour, intraretinal fluid, subretinal fluid.   Left Eye Quality was good. Central Foveal Thickness: 267. Progression has no prior data. Findings include normal foveal contour, intraretinal fluid, no SRF.   Notes *Images captured and stored on drive  Diagnosis / Impression:  OD: +central DME OS: +DME superior to fovea  Clinical management:  See below  Abbreviations: NFP - Normal foveal profile. CME - cystoid macular edema. PED - pigment epithelial detachment. IRF - intraretinal fluid. SRF - subretinal fluid. EZ - ellipsoid zone. ERM - epiretinal membrane. ORA - outer retinal atrophy. ORT - outer retinal tubulation. SRHM - subretinal hyper-reflective material         Fluorescein Angiography Optos (Transit OD)       Right Eye Progression has no prior data. Early phase findings include microaneurysm. Mid/Late phase findings include microaneurysm, leakage.    Left Eye Progression has no prior data. Early phase findings include microaneurysm. Mid/Late phase findings include leakage, microaneurysm.   Notes *Images captured and stored on drive;   Impression: Late leaking MAs OU No NV Severe NPDR         Intravitreal Injection, Pharmacologic Agent - OD - Right Eye       Time Out 07/22/2017. 3:54 PM. Confirmed correct patient, procedure, site, and patient consented.   Anesthesia Topical anesthesia was used. Anesthetic medications included Lidocaine 2%, Tetracaine 0.5%.   Procedure Preparation included 5% betadine to ocular surface, eyelid speculum. A 30 gauge needle was used.   Injection: 1.25 mg Bevacizumab 1.25mg /0.57ml   NDC: 21308-657-84    Lot: [id number]     Expiration Date: 10/08/2017   Route: Intravitreal   Site: Right Eye   Waste: 0 mg  Post-op Post injection exam found visual acuity of at least counting fingers. The patient tolerated the procedure well. There were no complications. The patient received written and verbal post procedure care education.                 ASSESSMENT/PLAN:    ICD-10-CM   1. Severe nonproliferative diabetic retinopathy of both eyes with macular edema associated with type 2 diabetes mellitus (HCC) W54.6270 Fluorescein Angiography Optos (Transit OD)    Intravitreal Injection, Pharmacologic Agent - OD - Right Eye    Bevacizumab (AVASTIN) SOLN 1.25 mg  2. Retinal edema H35.81 OCT, Retina - OU - Both Eyes  3. Essential hypertension I10   4. Hypertensive retinopathy of both eyes H35.033 Fluorescein Angiography Optos (Transit OD)  5. Combined forms of age-related cataract of both eyes H25.813     1, 2. Severe Non-proliferative diabetic retinopathy, OU - The incidence, risk factors for progression, natural history and treatment options for diabetic retinopathy  were discussed with patient.   - The need for close monitoring of blood glucose, blood pressure, and serum lipids, avoiding  cigarette or any type of tobacco, and the need for long term follow up was also discussed with patient. - exam with extensive 360 IRH, CWS and venous beading OU - FA shows late leaking MAs OU, No NV - OCT shows diabetic macular edema, OU (OD > OS, OS non-central) The natural history, pathology, and characteristics of diabetic macular edema discussed with patient.  A generalized discussion of the major clinical trials concerning treatment of diabetic macular edema (ETDRS, DCT, SCORE, RISE / RIDE, and ongoing DRCR net studies) was completed.  This discussion included mention of the various approaches to treating diabetic macular edema (observation, laser photocoagulation, anti-VEGF injections with lucentis / Avastin / Eylea, steroid injections with Kenalog / Ozurdex, and intraocular surgery with vitrectomy).  The goal hemoglobin A1C of 6-7 was discussed, as well as importance of smoking cessation and hypertension control.  Need for ongoing treatment and monitoring were specifically discussed with reference to chronic nature of diabetic macular edema. - recommend IVA #1 OD today, 07.17.19 - pt wishes to proceed - RBA of procedure discussed, questions answered - informed consent obtained and signed - see procedure note - f/u in 4 wks -- DFE / OCT / possible injection vs focal laser  3,4. Hypertensive retinopathy OU - discussed importance of tight BP control - monitor  5. Combined form age related cataract OU-  - The symptoms of cataract, surgical options, and treatments and risks were discussed with patient. - discussed diagnosis and progression - not yet visually significant - monitor for now   Ophthalmic Meds Ordered this visit:  Meds ordered this encounter  Medications  . Bevacizumab (AVASTIN) SOLN 1.25 mg       Return in about 1 month (around 08/19/2017) for F/U NPDR OU, DFE, OCT.  There are no Patient Instructions on file for this visit.   Explained the diagnoses, plan, and follow  up with the patient and they expressed understanding.  Patient expressed understanding of the importance of proper follow up care.   This document serves as a record of services personally performed by Karie Chimera, MD, PhD. It was created on their behalf by Laurian Brim, OA, an ophthalmic assistant. The creation of this record is the provider's dictation and/or activities during the visit.    Electronically signed by: Laurian Brim, OA  07.16.2019 1:02 AM   This document serves as a record of services personally performed by Karie Chimera, MD, PhD. It was created on their behalf by Virgilio Belling, COA, a certified ophthalmic assistant. The creation of this record is the provider's dictation and/or activities during the visit.  Electronically signed by: Virgilio Belling, COA  07.17.19 1:02 AM   Karie Chimera, M.D., Ph.D. Diseases & Surgery of the Retina and Vitreous [facility]   I have reviewed the above documentation for accuracy and completeness, and I agree with the above. Karie Chimera, M.D., Ph.D. 07/23/17 1:04 AM   Abbreviations: M myopia (nearsighted); A astigmatism; H hyperopia (farsighted); P presbyopia; Mrx spectacle prescription;  CTL contact lenses; OD right eye; OS left eye; OU both eyes  XT exotropia; ET esotropia; PEK punctate epithelial keratitis; PEE punctate epithelial erosions; DES dry eye syndrome; MGD meibomian gland dysfunction; ATs artificial tears; PFAT's preservative free artificial tears; NSC nuclear sclerotic cataract; PSC posterior subcapsular cataract; ERM epi-retinal membrane; PVD posterior vitreous detachment; RD retinal detachment; DM diabetes mellitus; DR diabetic retinopathy; NPDR non-proliferative diabetic retinopathy; PDR proliferative diabetic retinopathy; CSME clinically significant macular edema; DME  diabetic macular edema; dbh dot blot hemorrhages; CWS cotton wool spot; POAG primary open angle glaucoma; C/D cup-to-disc ratio;  HVF humphrey visual field; GVF goldmann visual field; OCT optical coherence tomography; IOP intraocular pressure; BRVO Branch retinal vein occlusion; CRVO central retinal vein occlusion; CRAO central retinal artery occlusion; BRAO branch retinal artery occlusion; RT retinal tear; SB scleral buckle; PPV pars plana vitrectomy; VH Vitreous hemorrhage; PRP panretinal laser photocoagulation; IVK intravitreal kenalog; VMT vitreomacular traction; MH Macular hole;  NVD neovascularization of the disc; NVE neovascularization elsewhere; AREDS age related eye disease study; ARMD age related macular degeneration; POAG primary open angle glaucoma; EBMD epithelial/anterior basement membrane dystrophy; ACIOL anterior chamber intraocular lens; IOL intraocular lens; PCIOL posterior chamber intraocular lens; Phaco/IOL phacoemulsification with intraocular lens placement; [place] photorefractive keratectomy; LASIK laser assisted in situ keratomileusis; HTN hypertension; DM diabetes mellitus; COPD chronic obstructive pulmonary disease

## 2017-07-21 NOTE — Progress Notes (Signed)
Patient ID: [name], female   DOB: 07/23/1970, 47 y.o.   MRN: [id number] Sheila Gibson presents for eval of PCB x 1 yr. Pt reports LMP was 11/18. 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 Bleeding last 1-2 weeks Last episodes was end of May and last for 2 weeks She has not had any since due to no IC. Was seen in ER on 06/06/17. U/S was WNL. Dx with UTI treated with Keflex. Pt reports occ lower abd pain. Unknown last pap smear Sexual active. Denies menopausal Sx. Multiple chronic medical problems as listed. PCP [facility]  TSVD x 2 and C section x 2  PE AF  BP elevated (pt reports nervous) Lungs clear Heart RRR Abd soft + BS obese GU small wart left labia cervix no lesions uterus small < 10 weeks non tender no masses limited by pt habitus  ENDOMETRIAL BIOPSY     The indications for endometrial biopsy were reviewed.   Risks of the biopsy including cramping, bleeding, infection, uterine perforation, inadequate specimen and need for additional procedures  were discussed. The patient states she understands and agrees to undergo procedure today. Consent was signed. Time out was performed. Urine HCG was negative. During the pelvic exam, the cervix was prepped with Betadine. A single-toothed tenaculum was placed on the anterior lip of the cervix to stabilize it. The 3 mm pipelle was introduced into the endometrial cavity without difficulty to a depth of 9cm, and a moderate amount of tissue was obtained and sent to pathology. The instruments were removed from the patient's vagina. Minimal bleeding from the cervix was noted. The patient tolerated the procedure well. Routine post-procedure instructions were given to the patient.      A/P PCB        Pelvic pain  Pap smear and EMBX completed today. UC for pain F/U pending test results.

## 2017-07-21 NOTE — Patient Instructions (Signed)

## 2017-07-22 ENCOUNTER — Encounter (INDEPENDENT_AMBULATORY_CARE_PROVIDER_SITE_OTHER): Payer: Self-pay | Admitting: Ophthalmology

## 2017-07-22 ENCOUNTER — Other Ambulatory Visit (HOSPITAL_COMMUNITY)
Admission: RE | Admit: 2017-07-22 | Discharge: 2017-07-22 | Disposition: A | Discharge status: Home / Self Care | Payer: Medicaid Other | Source: Ambulatory Visit | Attending: Obstetrics and Gynecology | Admitting: Obstetrics and Gynecology

## 2017-07-22 ENCOUNTER — Ambulatory Visit (INDEPENDENT_AMBULATORY_CARE_PROVIDER_SITE_OTHER): Payer: Medicaid Other | Admitting: Ophthalmology

## 2017-07-22 DIAGNOSIS — Z01419 Encounter for gynecological examination (general) (routine) without abnormal findings: Secondary | ICD-10-CM | POA: Diagnosis not present

## 2017-07-22 DIAGNOSIS — H35033 Hypertensive retinopathy, bilateral: Secondary | ICD-10-CM | POA: Diagnosis not present

## 2017-07-22 DIAGNOSIS — H25813 Combined forms of age-related cataract, bilateral: Secondary | ICD-10-CM

## 2017-07-22 DIAGNOSIS — H3581 Retinal edema: Secondary | ICD-10-CM | POA: Diagnosis not present

## 2017-07-22 DIAGNOSIS — I10 Essential (primary) hypertension: Secondary | ICD-10-CM | POA: Diagnosis not present

## 2017-07-22 DIAGNOSIS — E113413 Type 2 diabetes mellitus with severe nonproliferative diabetic retinopathy with macular edema, bilateral: Secondary | ICD-10-CM

## 2017-07-22 NOTE — Addendum Note (Signed)
Addended by: Marya LandryFOSTER, Kiarah Eckstein D on: 07/22/2017 02:12 PM   Modules accepted: Orders

## 2017-07-23 ENCOUNTER — Encounter (INDEPENDENT_AMBULATORY_CARE_PROVIDER_SITE_OTHER): Payer: Self-pay | Admitting: Ophthalmology

## 2017-07-23 ENCOUNTER — Encounter (HOSPITAL_COMMUNITY): Payer: Self-pay | Admitting: Emergency Medicine

## 2017-07-23 ENCOUNTER — Emergency Department (HOSPITAL_COMMUNITY)
Admission: EM | Admit: 2017-07-23 | Discharge: 2017-07-24 | Disposition: A | Discharge status: Home / Self Care | Payer: Medicaid Other | Attending: Emergency Medicine | Admitting: Emergency Medicine

## 2017-07-23 DIAGNOSIS — Z79899 Other long term (current) drug therapy: Secondary | ICD-10-CM | POA: Diagnosis not present

## 2017-07-23 DIAGNOSIS — J449 Chronic obstructive pulmonary disease, unspecified: Secondary | ICD-10-CM | POA: Insufficient documentation

## 2017-07-23 DIAGNOSIS — E039 Hypothyroidism, unspecified: Secondary | ICD-10-CM | POA: Insufficient documentation

## 2017-07-23 DIAGNOSIS — E119 Type 2 diabetes mellitus without complications: Secondary | ICD-10-CM | POA: Diagnosis not present

## 2017-07-23 DIAGNOSIS — M791 Myalgia, unspecified site: Secondary | ICD-10-CM | POA: Diagnosis present

## 2017-07-23 DIAGNOSIS — E113413 Type 2 diabetes mellitus with severe nonproliferative diabetic retinopathy with macular edema, bilateral: Secondary | ICD-10-CM | POA: Diagnosis not present

## 2017-07-23 DIAGNOSIS — Z794 Long term (current) use of insulin: Secondary | ICD-10-CM | POA: Insufficient documentation

## 2017-07-23 DIAGNOSIS — I10 Essential (primary) hypertension: Secondary | ICD-10-CM | POA: Insufficient documentation

## 2017-07-23 LAB — CYTOLOGY - PAP
Diagnosis: NEGATIVE
HPV (WINDOPATH): DETECTED — AB

## 2017-07-23 LAB — BASIC METABOLIC PANEL
ANION GAP: 5 (ref 5–15)
CALCIUM: 7.4 mg/dL — AB (ref 8.9–10.3)
CO2: 21 mmol/L — AB (ref 22–32)
Chloride: 107 mmol/L (ref 98–111)
Creatinine, Ser: 0.74 mg/dL (ref 0.44–1.00)
GFR calc Af Amer: >60 mL/min (ref >60)
GFR calc non Af Amer: >60 mL/min (ref >60)
GLUCOSE: 339 mg/dL — AB (ref 70–99)
Potassium: 3.6 mmol/L (ref 3.5–5.1)
Sodium: 133 mmol/L — ABNORMAL LOW (ref 135–145)

## 2017-07-23 LAB — CBC
HEMATOCRIT: 33 % — AB (ref 36.0–46.0)
Hemoglobin: 10.5 g/dL — ABNORMAL LOW (ref 12.0–15.0)
MCH: 29.7 pg (ref 26.0–34.0)
MCHC: 31.8 g/dL (ref 30.0–36.0)
MCV: 93.5 fL (ref 78.0–100.0)
Platelets: 137 10*3/uL — ABNORMAL LOW (ref 150–400)
RBC: 3.53 MIL/uL — ABNORMAL LOW (ref 3.87–5.11)
RDW: 13.9 % (ref 11.5–15.5)
WBC: 8.5 10*3/uL (ref 4.0–10.5)

## 2017-07-23 LAB — I-STAT BETA HCG BLOOD, ED (MC, WL, AP ONLY): I-stat hCG, quantitative: <5 m[IU]/mL (ref <5)

## 2017-07-23 MED ORDER — OXYCODONE HCL 5 MG PO TABS
5.0000 mg | ORAL_TABLET | Freq: Once | ORAL | Status: AC
  Administered 2017-07-23: 5 mg via ORAL
  Filled 2017-07-23: qty 1

## 2017-07-23 MED ORDER — BEVACIZUMAB CHEMO INJECTION 1.25MG/0.05ML SYRINGE FOR KALEIDOSCOPE
1.2500 mg | INTRAVITREAL | Status: AC
  Administered 2017-07-23: 1.25 mg via INTRAVITREAL

## 2017-07-23 NOTE — ED Triage Notes (Signed)
Pt c/o neuropathy in hands and feet as well as generalized body aches. Hx of neuropathy, states gabapentin normally helps, but hasn't lately.

## 2017-07-23 NOTE — ED Provider Notes (Signed)
Patient placed in Quick Look pathway, seen and evaluated   Chief Complaint: body aches  HPI:   Eileen StanfordValencia Brunette is a 47 y.o. female with hx of neuropathy. Patient is taking gabapentin 800 mg three times a day that usually helps but today the pain has been severe and the medication is not helping. Patient states that the usual pain is in her hands and feet but today is all over.   ROS: Neuro: neuropathy generalized pain  Physical Exam:  BP (!) 168/95 (BP Location: Right Wrist)   Pulse 90   Temp 99.8 F (37.7 C) (Oral)   Resp 18   SpO2 99%    Gen: No distress appears uncomfortable  Neuro: Awake and Alert  Skin: Warm and dry       Initiation of care has begun. The patient has been counseled on the process, plan, and necessity for staying for the completion/evaluation, and the remainder of the medical screening examination    Janne Napoleoneese, Hope M, NP 07/23/17 2031    Eber HongMiller, Brian, MD 07/28/17 2102

## 2017-07-24 LAB — URINE CULTURE

## 2017-07-24 LAB — CK: CK TOTAL: 184 U/L (ref 38–234)

## 2017-07-24 LAB — CBG MONITORING, ED: GLUCOSE-CAPILLARY: 273 mg/dL — AB (ref 70–99)

## 2017-07-24 MED ORDER — SODIUM CHLORIDE 0.9 % IV BOLUS (SEPSIS)
1000.0000 mL | Freq: Once | INTRAVENOUS | Status: AC
  Administered 2017-07-24: 1000 mL via INTRAVENOUS

## 2017-07-24 MED ORDER — SODIUM CHLORIDE 0.9 % IV SOLN
1000.0000 mL | INTRAVENOUS | Status: DC
  Administered 2017-07-24: 1000 mL via INTRAVENOUS

## 2017-07-24 MED ORDER — KETOROLAC TROMETHAMINE 30 MG/ML IJ SOLN
30.0000 mg | Freq: Once | INTRAMUSCULAR | Status: AC
  Administered 2017-07-24: 30 mg via INTRAVENOUS
  Filled 2017-07-24: qty 1

## 2017-07-24 NOTE — ED Notes (Signed)
ED Provider at bedside. 

## 2017-07-24 NOTE — ED Provider Notes (Signed)
[facility] EMERGENCY DEPARTMENT Provider Note   CSN: [id number] Arrival date & time: 07/23/17  2003     History   Chief Complaint Chief Complaint  Patient presents with  . Peripheral Neuropathy   As well as COPD and hypothyroidism. HPI Sheila Gibson is a 47 y.o. female.  Patient complains of "peripheral neuropathy pain in her bilateral hands and feet.  States this is typical of her neuropathy pain for which she takes gabapentin.  Over the past 3 days though her pain is not been controlled with this medication.  She denies any falls or injuries.  She denies any weakness in her arms or legs.  She does have numbness and tingling and pain in her bilateral hands and feet.No fever.  No chest pain or shortness of breath.  Does have a history of diabetes as well as COPD and hypothyroidism.  The history is provided by the patient.    Past Medical History:  Diagnosis Date  . COPD (chronic obstructive pulmonary disease) (HCC)   . Depression   . Diabetes mellitus without complication (HCC)   . Heart murmur   . Hepatic cirrhosis (HCC)   . Hypertension   . Obesity   . Thyroid disease     Patient Active Problem List   Diagnosis Date Noted  . PCB (post coital bleeding) 07/21/2017    Past Surgical History:  Procedure Laterality Date  . CESAREAN SECTION    . THYROIDECTOMY       OB History    Gravida  6   Para  5   Term  5   Preterm      AB  1   Living  4     SAB  1   TAB      Ectopic      Multiple      Live Births  4            Home Medications    Prior to Admission medications   Medication Sig Start Date End Date Taking? Authorizing Provider  acyclovir (ZOVIRAX) 400 MG tablet TAKE ONE TABLET BY MOUTH THREE TIMES A DAY. FOR 5 DAYS 04/14/17   [provider]  albuterol (PROVENTIL HFA;VENTOLIN HFA) 108 (90 BASE) MCG/ACT inhaler Inhale 1-2 puffs into the lungs every 6 (six) hours as needed for wheezing or shortness of breath.     [provider]  gabapentin (NEURONTIN) 300 MG capsule Take 300 mg by mouth 3 (three) times daily.     [provider]  ibuprofen (ADVIL,MOTRIN) 800 MG tablet Take 800 mg by mouth every 8 (eight) hours as needed for moderate pain.    [provider]  insulin glargine (LANTUS) 100 unit/mL SOPN Inject 60 Units into the skin 2 (two) times daily.     [provider]  levothyroxine (SYNTHROID, LEVOTHROID) 50 MCG tablet TAKE ONE TABLET IN THE MORNING ON AN EMPTY STOMACH 07/20/17   [provider]  tiZANidine (ZANAFLEX) 4 MG tablet Take 4 mg by mouth 3 (three) times daily as needed for muscle spasms.     [provider]  venlafaxine XR (EFFEXOR-XR) 37.5 MG 24 hr capsule Take 37.5 mg by mouth daily. 07/06/17   [provider]  zolpidem (AMBIEN) 10 MG tablet Take 10 mg by mouth at bedtime as needed for sleep.  01/23/17   [provider]  ZOVIRAX 5 % APPLY TO AFFECTED AREA(S) THREE TIMES A DAY 04/14/17   [provider]  Family History Family History  Problem Relation Age of Onset  . Cancer Mother   . Hypertension Other     Social History Social History   Tobacco Use  . Smoking status: Never Smoker  . Smokeless tobacco: Never Used  Substance Use Topics  . Alcohol use: Not Currently  . Drug use: No     Allergies   Patient has no known allergies.   Review of Systems Review of Systems  Constitutional: Negative for activity change, appetite change and fever.  HENT: Negative for congestion and rhinorrhea.   Eyes: Negative for visual disturbance.  Respiratory: Negative for cough, chest tightness and shortness of breath.   Cardiovascular: Negative for chest pain.  Gastrointestinal: Negative for abdominal pain, nausea and vomiting.  Genitourinary: Negative for dysuria, vaginal bleeding and vaginal discharge.  Musculoskeletal: Positive for arthralgias and myalgias.  Skin: Negative for rash.  Neurological:  Negative for dizziness, weakness and headaches.   all other systems are negative except as noted in the HPI and PMH.     Physical Exam Updated Vital Signs BP 129/79 (BP Location: Left Wrist)   Pulse 71   Temp 99.8 F (37.7 C) (Oral)   Resp 18   SpO2 100%   Physical Exam  Constitutional: She is oriented to person, place, and time. She appears well-developed and well-nourished. No distress.  Obese  HENT:  Head: Normocephalic and atraumatic.  Mouth/Throat: Oropharynx is clear and moist. No oropharyngeal exudate.  Eyes: Pupils are equal, round, and reactive to light. Conjunctivae and EOM are normal.  Neck: Normal range of motion. Neck supple.  No meningismus.  Cardiovascular: Normal rate, regular rhythm, normal heart sounds and intact distal pulses.  No murmur heard. Pulmonary/Chest: Effort normal and breath sounds normal. No respiratory distress. She exhibits no tenderness.  Abdominal: Soft. There is no tenderness. There is no rebound and no guarding.  Musculoskeletal: Normal range of motion. She exhibits no edema or tenderness.  Bilateral hands and feet appear normal to inspection.  Intact radial pulses, DP and PT pulses.  Calves are nontender.  No asymmetry or erythema.   Neurological: She is alert and oriented to person, place, and time. No cranial nerve deficit. She exhibits normal muscle tone. Coordination normal.  No ataxia on finger to nose bilaterally. No pronator drift. 5/5 strength throughout. CN 2-12 intact.Equal grip strength. Sensation intact.   Skin: Skin is warm. Capillary refill takes less than 2 seconds. No rash noted.  Psychiatric: She has a normal mood and affect. Her behavior is normal.  Nursing note and vitals reviewed.    ED Treatments / Results  Labs (all labs ordered are listed, but only abnormal results are displayed) Labs Reviewed  BASIC METABOLIC PANEL - Abnormal; Notable for the following components:      Result Value   Sodium 133 (*)    CO2 21  (*)    Glucose, Bld 339 (*)    BUN <5 (*)    Calcium 7.4 (*)    All other components within normal limits  CBC - Abnormal; Notable for the following components:   RBC 3.53 (*)    Hemoglobin 10.5 (*)    HCT 33.0 (*)    Platelets 137 (*)    All other components within normal limits  CBG MONITORING, ED - Abnormal; Notable for the following components:   Glucose-Capillary 273 (*)    All other components within normal limits  CK  I-STAT BETA HCG BLOOD, ED ([facility], [facility], [facility] ONLY)  I-STAT BETA  HCG BLOOD, ED ([facility], [facility], [facility] ONLY)    EKG None  Radiology No results found.  Procedures Procedures (including critical care time)  Medications Ordered in ED Medications  sodium chloride 0.9 % bolus 1,000 mL (0 mLs Intravenous Stopped 07/24/17 0528)    Followed by  0.9 %  sodium chloride infusion (1,000 mLs Intravenous New Bag/Given 07/24/17 0430)  oxyCODONE (Oxy IR/ROXICODONE) immediate release tablet 5 mg (5 mg Oral Given 07/23/17 2032)  ketorolac (TORADOL) 30 MG/ML injection 30 mg (30 mg Intravenous Given 07/24/17 0449)     Initial Impression / Assessment and Plan / ED Course  I have reviewed the triage vital signs and the nursing notes.  Pertinent labs & imaging results that were available during my care of the patient were reviewed by me and considered in my medical decision making (see chart for details).    Worsening chronic pain of hands and feet.  No evidence of neurovascular compromise.  Patient will be hydrated.  Labs are reassuring with hyperglycemia, no DKA  Patient feels improved after IV hydration and analgesics.  CK is normal. Hyperglycemia without evidence of DKA.  Discussed increased hydration especially in the hot weather.  Continue medications as prescribed and follow-up with PCP.  Return precautions discussed.  Final Clinical Impressions(s) / ED Diagnoses   Final diagnoses:  Myalgia    ED Discharge Orders    None       Karrington Mccravy, Jeannett SeniorStephen, MD 07/24/17 [phone]

## 2017-07-24 NOTE — ED Notes (Signed)
CBG - 273 

## 2017-07-24 NOTE — Discharge Instructions (Addendum)
Your blood work is normal other than high blood sugar.  Keep yourself hydrated and take your diabetes medications as prescribed.  Follow-up with a primary care physician.  Return to the ED if you develop new or worsening symptoms.

## 2017-07-24 NOTE — ED Notes (Signed)
Signature pad unavailable at time of pt discharge. Pt verbalized understanding of instructions. Pt denied any further requests.  

## 2017-08-05 ENCOUNTER — Encounter: Payer: Self-pay | Admitting: Obstetrics and Gynecology

## 2017-08-05 ENCOUNTER — Ambulatory Visit (INDEPENDENT_AMBULATORY_CARE_PROVIDER_SITE_OTHER): Payer: Medicaid Other | Admitting: Obstetrics and Gynecology

## 2017-08-05 DIAGNOSIS — A630 Anogenital (venereal) warts: Secondary | ICD-10-CM | POA: Diagnosis not present

## 2017-08-05 NOTE — Progress Notes (Signed)
RGYN patient presents for F/U visit today.  EMBX: 07/21/17 WNL per Dr.Ervin  LMP: per pt states she just finished period this past Sunday.

## 2017-08-05 NOTE — Progress Notes (Signed)
Patient ID: Sheila StanfordValencia Herandez, female   DOB: 01/18/1970, 47 y.o.   MRN: [id number] [name] presents for follow up for PCB. She has had a normal U/S and EMBX. She has had no further PCB.  PE AF   BP as noted Lungs clear Heart RRR And soft + BS   A/P  PCB, resolved         HTN/DM, pt instructed to continue follow up with PCP           Genital wart, appt made for TCA

## 2017-08-17 ENCOUNTER — Other Ambulatory Visit: Payer: Self-pay

## 2017-08-17 ENCOUNTER — Observation Stay (HOSPITAL_COMMUNITY)
Admission: EM | Admit: 2017-08-17 | Discharge: 2017-08-18 | Disposition: A | Discharge status: Home / Self Care | Payer: Medicaid Other | Attending: Internal Medicine | Admitting: Internal Medicine

## 2017-08-17 ENCOUNTER — Emergency Department (HOSPITAL_COMMUNITY): Payer: Medicaid Other

## 2017-08-17 ENCOUNTER — Encounter (HOSPITAL_COMMUNITY): Payer: Self-pay | Admitting: *Deleted

## 2017-08-17 DIAGNOSIS — K7460 Unspecified cirrhosis of liver: Secondary | ICD-10-CM | POA: Insufficient documentation

## 2017-08-17 DIAGNOSIS — I10 Essential (primary) hypertension: Secondary | ICD-10-CM | POA: Insufficient documentation

## 2017-08-17 DIAGNOSIS — F329 Major depressive disorder, single episode, unspecified: Secondary | ICD-10-CM | POA: Insufficient documentation

## 2017-08-17 DIAGNOSIS — R011 Cardiac murmur, unspecified: Secondary | ICD-10-CM | POA: Diagnosis not present

## 2017-08-17 DIAGNOSIS — Z6841 Body Mass Index (BMI) 40.0 and over, adult: Secondary | ICD-10-CM | POA: Insufficient documentation

## 2017-08-17 DIAGNOSIS — B182 Chronic viral hepatitis C: Secondary | ICD-10-CM | POA: Diagnosis not present

## 2017-08-17 DIAGNOSIS — E876 Hypokalemia: Secondary | ICD-10-CM | POA: Insufficient documentation

## 2017-08-17 DIAGNOSIS — E11311 Type 2 diabetes mellitus with unspecified diabetic retinopathy with macular edema: Secondary | ICD-10-CM | POA: Diagnosis not present

## 2017-08-17 DIAGNOSIS — Z794 Long term (current) use of insulin: Secondary | ICD-10-CM | POA: Diagnosis not present

## 2017-08-17 DIAGNOSIS — J449 Chronic obstructive pulmonary disease, unspecified: Secondary | ICD-10-CM | POA: Insufficient documentation

## 2017-08-17 DIAGNOSIS — F1111 Opioid abuse, in remission: Secondary | ICD-10-CM | POA: Insufficient documentation

## 2017-08-17 DIAGNOSIS — E890 Postprocedural hypothyroidism: Secondary | ICD-10-CM | POA: Insufficient documentation

## 2017-08-17 DIAGNOSIS — I160 Hypertensive urgency: Secondary | ICD-10-CM | POA: Diagnosis not present

## 2017-08-17 DIAGNOSIS — F1411 Cocaine abuse, in remission: Secondary | ICD-10-CM | POA: Insufficient documentation

## 2017-08-17 DIAGNOSIS — E1140 Type 2 diabetes mellitus with diabetic neuropathy, unspecified: Secondary | ICD-10-CM | POA: Insufficient documentation

## 2017-08-17 DIAGNOSIS — Z79899 Other long term (current) drug therapy: Secondary | ICD-10-CM | POA: Insufficient documentation

## 2017-08-17 DIAGNOSIS — R519 Headache, unspecified: Secondary | ICD-10-CM

## 2017-08-17 DIAGNOSIS — R51 Headache: Secondary | ICD-10-CM

## 2017-08-17 DIAGNOSIS — F419 Anxiety disorder, unspecified: Secondary | ICD-10-CM | POA: Insufficient documentation

## 2017-08-17 DIAGNOSIS — R110 Nausea: Secondary | ICD-10-CM

## 2017-08-17 LAB — CBC WITH DIFFERENTIAL/PLATELET
Basophils Absolute: 0 10*3/uL (ref 0.0–0.1)
Basophils Relative: 0 %
Eosinophils Absolute: 0.2 10*3/uL (ref 0.0–0.7)
Eosinophils Relative: 2 %
HCT: 36.5 % (ref 36.0–46.0)
Hemoglobin: 12.3 g/dL (ref 12.0–15.0)
Lymphocytes Relative: 35 %
Lymphs Abs: 3.1 10*3/uL (ref 0.7–4.0)
MCH: 30.1 pg (ref 26.0–34.0)
MCHC: 33.7 g/dL (ref 30.0–36.0)
MCV: 89.5 fL (ref 78.0–100.0)
Monocytes Absolute: 0.7 10*3/uL (ref 0.1–1.0)
Monocytes Relative: 8 %
Neutro Abs: 4.9 10*3/uL (ref 1.7–7.7)
Neutrophils Relative %: 55 %
Platelets: 168 10*3/uL (ref 150–400)
RBC: 4.08 MIL/uL (ref 3.87–5.11)
RDW: 14.2 % (ref 11.5–15.5)
WBC: 8.8 10*3/uL (ref 4.0–10.5)

## 2017-08-17 LAB — BASIC METABOLIC PANEL
ANION GAP: 7 (ref 5–15)
BUN: 8 mg/dL (ref 6–20)
CALCIUM: 8.7 mg/dL — AB (ref 8.9–10.3)
CO2: 27 mmol/L (ref 22–32)
Chloride: 107 mmol/L (ref 98–111)
Creatinine, Ser: 0.74 mg/dL (ref 0.44–1.00)
GFR calc Af Amer: >60 mL/min (ref >60)
GLUCOSE: 153 mg/dL — AB (ref 70–99)
Potassium: 3.4 mmol/L — ABNORMAL LOW (ref 3.5–5.1)
SODIUM: 141 mmol/L (ref 135–145)

## 2017-08-17 LAB — I-STAT BETA HCG BLOOD, ED (MC, WL, AP ONLY): I-stat hCG, quantitative: <5 m[IU]/mL (ref <5)

## 2017-08-17 LAB — GLUCOSE, CAPILLARY: Glucose-Capillary: 172 mg/dL — ABNORMAL HIGH (ref 70–99)

## 2017-08-17 MED ORDER — MAGNESIUM SULFATE 2 GM/50ML IV SOLN
2.0000 g | Freq: Once | INTRAVENOUS | Status: AC
  Administered 2017-08-17: 2 g via INTRAVENOUS
  Filled 2017-08-17: qty 50

## 2017-08-17 MED ORDER — TIZANIDINE HCL 4 MG PO TABS: 4.0000 mg | ORAL_TABLET | Freq: Three times a day (TID) | ORAL | Status: DC | PRN

## 2017-08-17 MED ORDER — DIPHENHYDRAMINE HCL 50 MG/ML IJ SOLN
25.0000 mg | Freq: Once | INTRAMUSCULAR | Status: AC
  Administered 2017-08-17: 25 mg via INTRAVENOUS
  Filled 2017-08-17: qty 1

## 2017-08-17 MED ORDER — VENLAFAXINE HCL ER 37.5 MG PO CP24
37.5000 mg | ORAL_CAPSULE | Freq: Every day | ORAL | Status: DC
  Administered 2017-08-18: 37.5 mg via ORAL
  Filled 2017-08-17: qty 1

## 2017-08-17 MED ORDER — SODIUM CHLORIDE 0.9 % IV BOLUS (SEPSIS)
1000.0000 mL | Freq: Once | INTRAVENOUS | Status: AC
  Administered 2017-08-17: 1000 mL via INTRAVENOUS

## 2017-08-17 MED ORDER — INSULIN ASPART 100 UNIT/ML ~~LOC~~ SOLN
0.0000 [IU] | Freq: Three times a day (TID) | SUBCUTANEOUS | Status: DC
  Administered 2017-08-18: 12 [IU] via SUBCUTANEOUS
  Administered 2017-08-18: 2 [IU] via SUBCUTANEOUS

## 2017-08-17 MED ORDER — HYDRALAZINE HCL 20 MG/ML IJ SOLN
10.0000 mg | Freq: Once | INTRAMUSCULAR | Status: AC
  Administered 2017-08-18: 10 mg via INTRAVENOUS
  Filled 2017-08-17: qty 1

## 2017-08-17 MED ORDER — HYDRALAZINE HCL 20 MG/ML IJ SOLN
10.0000 mg | Freq: Once | INTRAMUSCULAR | Status: AC
  Administered 2017-08-17: 10 mg via INTRAVENOUS
  Filled 2017-08-17: qty 1

## 2017-08-17 MED ORDER — METOCLOPRAMIDE HCL 5 MG/ML IJ SOLN
10.0000 mg | Freq: Once | INTRAMUSCULAR | Status: AC
  Administered 2017-08-17: 10 mg via INTRAVENOUS
  Filled 2017-08-17: qty 2

## 2017-08-17 MED ORDER — GABAPENTIN 400 MG PO CAPS
800.0000 mg | ORAL_CAPSULE | Freq: Three times a day (TID) | ORAL | Status: DC
  Administered 2017-08-17 – 2017-08-18 (×2): 800 mg via ORAL
  Filled 2017-08-17 (×2): qty 2

## 2017-08-17 MED ORDER — INSULIN GLARGINE 100 UNIT/ML ~~LOC~~ SOLN
50.0000 [IU] | Freq: Two times a day (BID) | SUBCUTANEOUS | Status: DC
  Administered 2017-08-17 – 2017-08-18 (×2): 50 [IU] via SUBCUTANEOUS
  Filled 2017-08-17 (×3): qty 0.5

## 2017-08-17 MED ORDER — ENOXAPARIN SODIUM 40 MG/0.4ML ~~LOC~~ SOLN: 40.0000 mg | SUBCUTANEOUS | Status: DC

## 2017-08-17 MED ORDER — DIPHENHYDRAMINE HCL 50 MG/ML IJ SOLN
25.0000 mg | Freq: Once | INTRAMUSCULAR | Status: AC
  Administered 2017-08-18: 25 mg via INTRAVENOUS
  Filled 2017-08-17: qty 1

## 2017-08-17 MED ORDER — HYDROCHLOROTHIAZIDE 25 MG PO TABS
25.0000 mg | ORAL_TABLET | Freq: Once | ORAL | Status: AC
  Administered 2017-08-17: 25 mg via ORAL
  Filled 2017-08-17 (×2): qty 1

## 2017-08-17 MED ORDER — ACETAMINOPHEN 500 MG PO TABS
1000.0000 mg | ORAL_TABLET | Freq: Once | ORAL | Status: AC
  Administered 2017-08-17: 1000 mg via ORAL
  Filled 2017-08-17: qty 2

## 2017-08-17 MED ORDER — ZOLPIDEM TARTRATE 5 MG PO TABS
5.0000 mg | ORAL_TABLET | Freq: Every evening | ORAL | Status: DC | PRN
  Administered 2017-08-17: 5 mg via ORAL
  Filled 2017-08-17: qty 1

## 2017-08-17 MED ORDER — TENOFOVIR DISOPROXIL FUMARATE 300 MG PO TABS
300.0000 mg | ORAL_TABLET | Freq: Every day | ORAL | Status: DC
  Administered 2017-08-18: 300 mg via ORAL
  Filled 2017-08-17: qty 1

## 2017-08-17 MED ORDER — KETOROLAC TROMETHAMINE 30 MG/ML IJ SOLN
30.0000 mg | Freq: Once | INTRAMUSCULAR | Status: AC
  Administered 2017-08-18: 30 mg via INTRAVENOUS
  Filled 2017-08-17: qty 1

## 2017-08-17 MED ORDER — LEVOTHYROXINE SODIUM 50 MCG PO TABS
50.0000 ug | ORAL_TABLET | Freq: Every day | ORAL | Status: DC
  Administered 2017-08-18: 50 ug via ORAL
  Filled 2017-08-17: qty 1

## 2017-08-17 MED ORDER — IOPAMIDOL (ISOVUE-370) INJECTION 76%
INTRAVENOUS | Status: AC
  Filled 2017-08-17: qty 100

## 2017-08-17 MED ORDER — IOPAMIDOL (ISOVUE-370) INJECTION 76%
100.0000 mL | Freq: Once | INTRAVENOUS | Status: AC | PRN
  Administered 2017-08-17: 100 mL via INTRAVENOUS

## 2017-08-17 MED ORDER — LOSARTAN POTASSIUM 50 MG PO TABS
50.0000 mg | ORAL_TABLET | Freq: Every day | ORAL | Status: DC
  Administered 2017-08-17 – 2017-08-18 (×2): 50 mg via ORAL
  Filled 2017-08-17 (×2): qty 1

## 2017-08-17 MED ORDER — POTASSIUM CITRATE ER 10 MEQ (1080 MG) PO TBCR
30.0000 meq | EXTENDED_RELEASE_TABLET | Freq: Once | ORAL | Status: AC
  Administered 2017-08-17: 30 meq via ORAL
  Filled 2017-08-17: qty 3

## 2017-08-17 MED ORDER — ACETAMINOPHEN 325 MG PO TABS
650.0000 mg | ORAL_TABLET | ORAL | Status: DC | PRN
  Administered 2017-08-18: 650 mg via ORAL
  Filled 2017-08-17: qty 2

## 2017-08-17 MED ORDER — KETOROLAC TROMETHAMINE 30 MG/ML IJ SOLN
15.0000 mg | Freq: Once | INTRAMUSCULAR | Status: AC
  Administered 2017-08-17: 15 mg via INTRAVENOUS
  Filled 2017-08-17: qty 1

## 2017-08-17 MED ORDER — TETRACAINE HCL 0.5 % OP SOLN
2.0000 [drp] | Freq: Once | OPHTHALMIC | Status: AC
  Administered 2017-08-17: 2 [drp] via OPHTHALMIC
  Filled 2017-08-17: qty 4

## 2017-08-17 MED ORDER — AMLODIPINE BESYLATE 5 MG PO TABS: 10.0000 mg | ORAL_TABLET | Freq: Every day | ORAL | Status: DC

## 2017-08-17 NOTE — ED Provider Notes (Signed)
[facility]-EMERGENCY DEPT Provider Note   CSN: [id number] Arrival date & time: 08/17/17  1225     History   Chief Complaint Chief Complaint  Patient presents with  . Eye Pain  . Headache    HPI Sheila Gibson is a 47 y.o. 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  Patient states that she is in a drug recovery program, complained of headache so she went to the NP that is there and they were concerned about her headache so they sent her here for further evaluation.  She describes her headache as 10/10 constant stabbing frontal headache that is somewhat behind the right eye and radiates into the neck, with no known aggravating factors, and unrelieved with 800 mg ibuprofen, extra strength Tylenol, and a cool compress.  She reports associated nausea.  She also states that yesterday she started having a sore throat.  She mentions that 4 weeks ago she had a procedure done on her right eye at [facility] (by Dr. Aura Camps) because she has had issues with this either in the past, 2 weeks ago she started noticing some blurry vision again as well as black circles and brown spots in her vision from her right eye.  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  At that time her Tono-Pen reading was 18 on the right eye and 16 on the left eye, her visual acuity in the right eye was 20/150 and in the left eye was 20/70.  She states that the vision changes have been ongoing for 2 weeks and has not changed recently, and preceded the headache.  She sees the retina specialist again in 2 days.  Of note, she is not on any blood pressure medications, she has never been put on a blood pressure medication, despite the fact  that on multiple prior visits she has been hypertensive.  She does not have a PCP.  She also mentions that she's had headaches in the past and was previously on a medication for migraines, but can't remember what it was called.  Chart review reveals that she was seen in the ED in 11/2016 for HA and was also noted to be hypertensive at that time.  She was given the migraine cocktail, work up was reassuring at that time, and she was discharged home with reglan rx.    She denies any URI symptoms, drooling, trismus, neck stiffness, eye redness or drainage, fevers, chills, lightheadedness, CP, SOB, abdominal pain, vomiting, diarrhea, constipation, dysuria, hematuria, new or worsening numbness/tingling, weakness, rashes, or any other complaints at this time.  She denies recent head injury or tick bites.  The history is provided by the patient and medical records. No language interpreter was used.  Eye Pain  Associated symptoms include headaches. Pertinent negatives include no chest pain, no abdominal pain and no shortness of breath.  Headache   Associated symptoms include nausea. Pertinent negatives include no fever, no shortness of breath and no vomiting.    Past Medical History:  Diagnosis Date  . COPD (chronic obstructive pulmonary disease) (HCC)   . Depression   . Diabetes mellitus without complication (HCC)   . Heart murmur   . Hepatic cirrhosis (HCC)   . Hypertension   . Obesity   .  Thyroid disease     Patient Active Problem List   Diagnosis Date Noted  . Genital warts 08/05/2017    Past Surgical History:  Procedure Laterality Date  . CESAREAN SECTION    . THYROIDECTOMY       OB History    Gravida  6   Para  5   Term  5   Preterm      AB  1   Living  4     SAB  1   TAB      Ectopic      Multiple      Live Births  4            Home Medications    Prior to Admission medications   Medication Sig Start Date End Date Taking? Authorizing Provider    gabapentin (NEURONTIN) 800 MG tablet Take 800 mg by mouth 3 (three) times daily.    Yes [provider]  hydrOXYzine (ATARAX/VISTARIL) 25 MG tablet Take 25 mg by mouth 3 (three) times daily as needed. for anxiety 08/05/17  Yes [provider]  ibuprofen (ADVIL,MOTRIN) 800 MG tablet Take 800 mg by mouth every 8 (eight) hours as needed for moderate pain.   Yes [provider]  levothyroxine (SYNTHROID, LEVOTHROID) 50 MCG tablet Take 50 mcg by mouth daily.  07/20/17  Yes [provider]  tenofovir (VIREAD) 300 MG tablet Take 300 mg by mouth daily. 08/03/17  Yes [provider]  tiZANidine (ZANAFLEX) 4 MG tablet Take 4 mg by mouth 3 (three) times daily as needed for muscle spasms.    Yes [provider]  venlafaxine XR (EFFEXOR-XR) 37.5 MG 24 hr capsule Take 37.5 mg by mouth daily. 07/06/17  Yes [provider]  acyclovir (ZOVIRAX) 400 MG tablet Take 400 mg by mouth 3 (three) times daily.  04/14/17   [provider]  albuterol (PROVENTIL HFA;VENTOLIN HFA) 108 (90 BASE) MCG/ACT inhaler Inhale 1-2 puffs into the lungs every 6 (six) hours as needed for wheezing or shortness of breath.    [provider]  insulin glargine (LANTUS) 100 unit/mL SOPN Inject 60 Units into the skin 2 (two) times daily.     [provider]  zolpidem (AMBIEN) 10 MG tablet Take 10 mg by mouth at bedtime as needed for sleep.  01/23/17   [provider]  ZOVIRAX 5 % Apply 1 application topically 3 (three) times daily.  04/14/17   [provider]    Family History Family History  Problem Relation Age of Onset  . Cancer Mother   . Hypertension Other     Social History Social History   Tobacco Use  . Smoking status: Never Smoker  . Smokeless tobacco: Never Used  Substance Use Topics  . Alcohol use: Not Currently  . Drug use: No     Allergies   Patient has no known allergies.   Review of Systems Review of Systems   Constitutional: Negative for chills and fever.  HENT: Positive for sore throat. Negative for congestion, drooling, rhinorrhea, sinus pressure and trouble swallowing.   Eyes: Positive for pain and visual disturbance (R eye blurry vision x2 wks). Negative for discharge and redness.  Respiratory: Negative for shortness of breath.   Cardiovascular: Negative for chest pain.  Gastrointestinal: Positive for nausea. Negative for abdominal pain, constipation, diarrhea and vomiting.  Genitourinary: Negative for dysuria and hematuria.  Musculoskeletal: Negative for arthralgias, myalgias and neck stiffness.  Skin: Negative for rash.  Allergic/Immunologic:  Positive for immunocompromised state (DM2).  Neurological: Positive for headaches. Negative for weakness, light-headedness and numbness.  Psychiatric/Behavioral: Negative for confusion.   All other systems reviewed and are negative for acute change except as noted in the HPI.    Physical Exam Updated Vital Signs BP (!) 186/101 (BP Location: Right Arm)   Pulse 78   Temp 98.2 F (36.8 C) (Oral)   Resp 16   Ht 5\' 7"  (1.702 m)   Wt 136.1 kg   LMP 07/28/2017 (Approximate)   SpO2 98%   BMI 46.99 kg/m   Physical Exam  Constitutional: She is oriented to person, place, and time. Vital signs are normal. She appears well-developed and well-nourished.  Non-toxic appearance. No distress.  Afebrile, nontoxic, NAD, BP 180s/100s similar to multiple prior visits  HENT:  Head: Normocephalic and atraumatic.  Nose: Mucosal edema present.  Mouth/Throat: Uvula is midline, oropharynx is clear and moist and mucous membranes are normal. No trismus in the jaw. No uvula swelling. Tonsils are 0 on the right. Tonsils are 0 on the left. No tonsillar exudate.  Mild nasal congestion. Oropharynx clear and moist, without uvular swelling or deviation, no trismus or drooling, no tonsillar swelling or erythema, no exudates.    Eyes: Pupils are equal, round, and reactive to  light. Conjunctivae and EOM are normal. Right eye exhibits no discharge. Left eye exhibits no discharge.  PERRL, EOMI, no nystagmus Conjunctiva clear No ocular drainage R eye tonopen 20, L eye tonopen 15  Visual Acuity: Right Eye Distance: 20/100 Left Eye Distance: 20/70 Bilateral Distance: 20/70  Neck: Normal range of motion. Neck supple. No spinous process tenderness and no muscular tenderness present. No neck rigidity. Normal range of motion present.  FROM intact without spinous process TTP, no bony stepoffs or deformities, no paraspinous muscle TTP or muscle spasms. No rigidity or meningeal signs. No bruising or swelling.   Cardiovascular: Normal rate, regular rhythm and intact distal pulses. Exam reveals no gallop and no friction rub.  Murmur heard. Faint systolic murmur heard at ULSB  Pulmonary/Chest: Effort normal and breath sounds normal. No respiratory distress. She has no decreased breath sounds. She has no wheezes. She has no rhonchi. She has no rales.  Abdominal: Soft. Normal appearance and bowel sounds are normal. She exhibits no distension. There is no tenderness. There is no rigidity, no rebound, no guarding, no CVA tenderness, no tenderness at McBurney's point and negative Murphy's sign.  Musculoskeletal: Normal range of motion.  MAE x4 Strength and sensation grossly intact in all extremities Distal pulses intact Gait steady  Neurological: She is alert and oriented to person, place, and time. She has normal strength. No cranial nerve deficit or sensory deficit. Coordination and gait normal. GCS eye subscore is 4. GCS verbal subscore is 5. GCS motor subscore is 6.  CN 2-12 grossly intact A&O x4 GCS 15 Sensation and strength intact Gait nonataxic including with tandem walking Coordination with finger-to-nose WNL Neg pronator drift   Skin: Skin is warm, dry and intact. No rash noted.  Psychiatric: She has a normal mood and affect.  Nursing note and vitals  reviewed.    ED Treatments / Results  Labs (all labs ordered are listed, but only abnormal results are displayed) Labs Reviewed  BASIC METABOLIC PANEL - Abnormal; Notable for the following components:      Result Value   Potassium 3.4 (*)    Glucose, Bld 153 (*)    Calcium 8.7 (*)    All other components within  normal limits  CBC WITH DIFFERENTIAL/PLATELET  I-STAT BETA HCG BLOOD, ED ([facility], [facility], [facility] ONLY)    EKG EKG Interpretation  Date/Time:  Monday August 17 2017 15:41:51 EDT Ventricular Rate:  75 PR Interval:    QRS Duration: 122 QT Interval:  450 QTC Calculation: 503 R Axis:   96 Text Interpretation:  Sinus rhythm Nonspecific intraventricular conduction delay Confirmed by Tilden Fossa [phone]) on 08/17/2017 3:47:34 PM   Radiology Ct Angio Head W Or Wo Contrast  Result Date: 08/17/2017 CLINICAL DATA:  Retro-orbital headache on the right. Blurred vision. EXAM: CT ANGIOGRAPHY HEAD AND NECK TECHNIQUE: Multidetector CT imaging of the head and neck was performed using the standard protocol during bolus administration of intravenous contrast. Multiplanar CT image reconstructions and MIPs were obtained to evaluate the vascular anatomy. Carotid stenosis measurements (when applicable) are obtained utilizing NASCET criteria, using the distal internal carotid diameter as the denominator. CONTRAST:  ISOVUE-370 IOPAMIDOL (ISOVUE-370) INJECTION 76% COMPARISON:  11/25/2016 FINDINGS: CT HEAD FINDINGS Brain: Normal appearance of the brain. No evidence of old or acute infarction, mass lesion, hemorrhage, hydrocephalus or extra-axial collection. Vascular: No abnormal vascular finding without contrast. Skull: Normal Sinuses: No evidence of sinusitis. Few retention cyst in the left maxillary sinus. Unerupted molar tooth of the left maxilla. Orbits: No abnormal orbital finding. Both globes appear normal. By history there is been recent procedure performed on the right, but no complication is evident  related to that. Review of the MIP images confirms the above findings CTA NECK FINDINGS Aortic arch: Normal Right carotid system: Common carotid artery widely patent to the bifurcation. The carotid bifurcation is normal. Cervical internal carotid artery is normal. Left carotid system: Common carotid artery widely patent to the bifurcation. Carotid bifurcation is normal. Cervical internal carotid artery is normal. Vertebral arteries: Vertebral arteries are not well seen because of motion and patient's size. I believe the vertebral arteries are normal, widely patent at their origin and through the cervical region to the foramen magnum. Posterior circulation is somewhat diminutive secondary to fetal origin of both posterior cerebral arteries. Skeleton: Normal except for lower cervical facet osteoarthritis. Other neck: 3.7 cm mass in the low right neck, unchanged since 2017 and likely related to a thyroid nodule. Upper chest: Normal Review of the MIP images confirms the above findings CTA HEAD FINDINGS Anterior circulation: Both internal carotid arteries are patent through the skull base and siphon regions. The anterior and middle cerebral vessels appear normal without stenosis or occlusion, aneurysm or vascular malformation. No abnormal vascular finding relating to the right orbit. Posterior circulation: Diminutive posterior circulation because of fetal origin of the posterior cerebral arteries. No evidence of posterior circulation stenosis or occlusion. Venous sinuses: Patent and normal. Anatomic variants: No abnormal variant. Delayed phase: No abnormal enhancement. Review of the MIP images confirms the above findings IMPRESSION: SOME: IMPRESSION: SOME No vascular abnormality to explain retro-orbital headache. Normal appearance of the brain. I do not discern any abnormality of the right orbit, despite the history of a recent surgical procedure. 3.7 cm mass in the low right neck presumed to derive from the thyroid  gland. Has this been previously evaluated? I think the patient has had previous thyroidectomy related to goiter and this could be some residual thyroid tissue. It is unchanged since a CT scan of 2017 and therefore is likely benign. Electronically Signed   By: Paulina Fusi M.D.   On: 08/17/2017 16:52   Ct Angio Neck W And/or Wo Contrast  Result Date: 08/17/2017  CLINICAL DATA:  Retro-orbital headache on the right. Blurred vision. EXAM: CT ANGIOGRAPHY HEAD AND NECK TECHNIQUE: Multidetector CT imaging of the head and neck was performed using the standard protocol during bolus administration of intravenous contrast. Multiplanar CT image reconstructions and MIPs were obtained to evaluate the vascular anatomy. Carotid stenosis measurements (when applicable) are obtained utilizing NASCET criteria, using the distal internal carotid diameter as the denominator. CONTRAST:  100mL ISOVUE-370 IOPAMIDOL (ISOVUE-370) INJECTION 76% COMPARISON:  11/25/2016 FINDINGS: CT HEAD FINDINGS Brain: Normal appearance of the brain. No evidence of old or acute infarction, mass lesion, hemorrhage, hydrocephalus or extra-axial collection. Vascular: No abnormal vascular finding without contrast. Skull: Normal Sinuses: No evidence of sinusitis. Few retention cyst in the left maxillary sinus. Unerupted molar tooth of the left maxilla. Orbits: No abnormal orbital finding. Both globes appear normal. By history there is been recent procedure performed on the right, but no complication is evident related to that. Review of the MIP images confirms the above findings CTA NECK FINDINGS Aortic arch: Normal Right carotid system: Common carotid artery widely patent to the bifurcation. The carotid bifurcation is normal. Cervical internal carotid artery is normal. Left carotid system: Common carotid artery widely patent to the bifurcation. Carotid bifurcation is normal. Cervical internal carotid artery is normal. Vertebral arteries: Vertebral arteries are  not well seen because of motion and patient's size. I believe the vertebral arteries are normal, widely patent at their origin and through the cervical region to the foramen magnum. Posterior circulation is somewhat diminutive secondary to fetal origin of both posterior cerebral arteries. Skeleton: Normal except for lower cervical facet osteoarthritis. Other neck: 3.7 cm mass in the low right neck, unchanged since 2017 and likely related to a thyroid nodule. Upper chest: Normal Review of the MIP images confirms the above findings CTA HEAD FINDINGS Anterior circulation: Both internal carotid arteries are patent through the skull base and siphon regions. The anterior and middle cerebral vessels appear normal without stenosis or occlusion, aneurysm or vascular malformation. No abnormal vascular finding relating to the right orbit. Posterior circulation: Diminutive posterior circulation because of fetal origin of the posterior cerebral arteries. No evidence of posterior circulation stenosis or occlusion. Venous sinuses: Patent and normal. Anatomic variants: No abnormal variant. Delayed phase: No abnormal enhancement. Review of the MIP images confirms the above findings IMPRESSION: SOME: IMPRESSION: SOME No vascular abnormality to explain retro-orbital headache. Normal appearance of the brain. I do not discern any abnormality of the right orbit, despite the history of a recent surgical procedure. 3.7 cm mass in the low right neck presumed to derive from the thyroid gland. Has this been previously evaluated? I think the patient has had previous thyroidectomy related to goiter and this could be some residual thyroid tissue. It is unchanged since a CT scan of 2017 and therefore is likely benign. Electronically Signed   By: Paulina FusiMark  Shogry M.D.   On: 08/17/2017 16:52    Procedures Procedures (including critical care time)  CRITICAL CARE Performed by: Rhona RaiderMercedes Batu Cassin   Total critical care time: 35 minutes  Critical  care time was exclusive of separately billable procedures and treating other patients.  Critical care was necessary to treat or prevent imminent or life-threatening deterioration.  Critical care was time spent personally by me on the following activities: development of treatment plan with patient and/or surrogate as well as nursing, discussions with consultants, evaluation of patient's response to treatment, examination of patient, obtaining history from patient or surrogate, ordering and performing treatments and interventions,  ordering and review of laboratory studies, ordering and review of radiographic studies, pulse oximetry and re-evaluation of patient's condition.   Medications Ordered in ED Medications  iopamidol (ISOVUE-370) 76 % injection (has no administration in time range)  hydrALAZINE (APRESOLINE) injection 10 mg (has no administration in time range)  magnesium sulfate IVPB 2 g 50 mL (has no administration in time range)  tetracaine (PONTOCAINE) 0.5 % ophthalmic solution 2 drop (2 drops Right Eye Given 08/17/17 1453)  hydrochlorothiazide (HYDRODIURIL) tablet 25 mg (25 mg Oral Given 08/17/17 1520)  metoCLOPramide (REGLAN) injection 10 mg (10 mg Intravenous Given 08/17/17 1451)    And  diphenhydrAMINE (BENADRYL) injection 25 mg (25 mg Intravenous Given 08/17/17 1451)    And  sodium chloride 0.9 % bolus 1,000 mL (0 mLs Intravenous Stopped 08/17/17 1603)    And  ketorolac (TORADOL) 30 MG/ML injection 15 mg (15 mg Intravenous Given 08/17/17 1451)  iopamidol (ISOVUE-370) 76 % injection 100 mL (100 mLs Intravenous Contrast Given 08/17/17 1619)     Initial Impression / Assessment and Plan / ED Course  I have reviewed the triage vital signs and the nursing notes.  Pertinent labs & imaging results that were available during my care of the patient were reviewed by me and considered in my medical decision making (see chart for details).     47 y.o. female here with HA x2 days; has also  had sore throat since yesterday, and mild nausea. Mentions that she had R eye injection 4wks ago (07/22/17- diagnosed with severe nonproliferative diabetic retinopathy central macular edema) and for 2wks has had blurry vision from that eye and black circles with brown spots in vision; this preceded HA, has ophthalmology visit in 2 days. On exam, no focal neuro deficits, mild nasal congestion, PERRL, EOMI, R eye tonopen 20, L eye tonopen 15 (similar to ophthalmology visit, 18 R/16L), visual acuity preserved compared to what was recently recorded at the ophthalmologist's (20/100 on R, 20/70 on L). BP 180s/100s which has been noted previously but interestingly pt is not on any meds. I suspect the HA is likely from HTN, will give HCTZ and migraine cocktail, get labs/CTA head/neck, and reassess. Discussed case with my attending Dr. Madilyn Hookees who agrees with plan.   6:42 PM CBC w/diff WNL. BMP with marginally low K 3.4, Gluc 153, otherwise WNL. BetaHCG neg. EKG without acute ischemic findings. CTA head/neck without acute intracranial pathology; 3.7cm mass in low right neck presumed to derive from thyroid, could be residual thyroid tissue, unchanged from 2017 CT therefore likely benign; no other acute findings. Pt still with 9/10 headache, BP still markedly elevated; concern now is that this is hypertensive urgency causing her headache, will proceed with IV hydralazine and give Mag Sulfate to treat possible migraine, and proceed with admission for hypertensive urgency and headache management. Pt states nausea improved and has eaten here. Will proceed with admission.   6:49 PM [facility] and will admit. Holding orders to be placed by admitting team. Please see their notes for further documentation of care. I appreciate their help with this pleasant pt's care. Pt stable at time of admission.    Final Clinical Impressions(s) / ED Diagnoses   Final diagnoses:  Hypertensive urgency  Acute intractable  headache, unspecified headache type  Nausea    ED Discharge Orders    [address]       Lavaya Defreitas, JayuyaMercedes, [place] JerseyPA-C 08/17/17 1849    Tilden Fossaees, Elizabeth, MD 08/19/17 1340

## 2017-08-17 NOTE — ED Notes (Signed)
Pt is alert and oriented x 4 and is verbally responsive. Pt has redness noted to bilateral eyes.

## 2017-08-17 NOTE — H&P (Signed)
History and Physical  Sheila StanfordValencia Esteve WUJ:[id number] DOB: 11/14/1970 DOA: 08/17/2017 1306  Referring physician: n/a PCP: Patient, No Pcp Per  Outpatient Specialists: diabetes specialist at OSH  Chief Complaint: refractory R sided headache and HTN  HPI: Sheila Gibson is a 47 y.o. female with hx of chronic HCV, IDDM, HTN (untreated) who presented with refractory R sided frontal headache since Saturday (2 days ago prior to coming to ED). Patient reports waking up with a headache, described as constant and throbbing. Denies any aura or nausea/vomiting. No focal neurological deficits. No new vision changes although at baseline has severely reduced R eyesight secondary to diabetic retinopathy. Denies flashing lights or floaters or any sensation of a curtain/visual field cut. She did have an injection in the R eye for macular degeneration on 7/18 -- and she has been having blurry vision in the R eye for past several weeks, preceding the headache. Denies photophobia or neck stiffness. This morning she continued to have the headache in the same distribution (R frontal and R side of her head) without relief despite OTC ibuprofen and tylenol, which prompted her to go to an outside provider (at [facility] program) where vitals check revealed SBP in 180s -- then instructed to come to the ED. She had prior episode of severe headache in 2018 and was seen in the ED -- the headache at that time resolved with one migraine cocktail. She is currently not prescribed any migraine therapies or meds for hypertension, although she has been told previously that she is hypertensive.   Review of Systems:  + severe R sided headache - baseline reduced vision R eye - no fevers/chills - no cough - no chest pain, dyspnea on exertion - no edema, PND, orthopnea - no nausea/vomiting; no tarry, melanotic or bloody stools - no dysuria, increased urinary frequency - no weight changes  Rest of systems reviewed are  negative, except as per above history.   Past Medical History:  Diagnosis Date  . COPD (chronic obstructive pulmonary disease) (HCC)   . Depression   . Diabetes mellitus without complication (HCC)   . Heart murmur   . Hepatic cirrhosis (HCC)   . Hypertension   . Obesity   . Thyroid disease    Past Surgical History:  Procedure Laterality Date  . CESAREAN SECTION    . THYROIDECTOMY     Social History:  reports that she has never smoked. She has never used smokeless tobacco. She reports that she drank alcohol. She reports that she does not use drugs.  No Known Allergies  Family History  Problem Relation Age of Onset  . Cancer Mother   . Hypertension Other      Prior to Admission medications   Medication Sig Start Date End Date Taking? Authorizing Provider  acetaminophen (TYLENOL) 500 MG tablet Take 500 mg by mouth daily as needed for moderate pain.   Yes [provider]  albuterol (PROVENTIL HFA;VENTOLIN HFA) 108 (90 BASE) MCG/ACT inhaler Inhale 1-2 puffs into the lungs every 6 (six) hours as needed for wheezing or shortness of breath.   Yes [provider]  gabapentin (NEURONTIN) 800 MG tablet Take 800 mg by mouth 3 (three) times daily.    Yes [provider]  hydrOXYzine (ATARAX/VISTARIL) 25 MG tablet Take 25 mg by mouth 3 (three) times daily as needed. for anxiety 08/05/17  Yes [provider]  ibuprofen (ADVIL,MOTRIN) 800 MG tablet Take 800 mg by mouth every 8 (eight) hours as needed for  moderate pain.   Yes [provider]  insulin aspart protamine- aspart (NOVOLOG MIX 70/30) (70-30) 100 UNIT/ML injection Inject 6 Units into the skin 2 (two) times daily with a meal.   Yes [provider]  insulin glargine (LANTUS) 100 unit/mL SOPN Inject 60 Units into the skin 2 (two) times daily.    Yes [provider]  levothyroxine (SYNTHROID, LEVOTHROID) 50 MCG tablet Take 50 mcg by mouth daily.  07/20/17  Yes [provider]  ondansetron (ZOFRAN) 4 MG tablet Take 4 mg by mouth daily as needed for nausea or vomiting.   Yes [provider]  tenofovir (VIREAD) 300 MG tablet Take 300 mg by mouth daily. 08/03/17  Yes [provider]  tiZANidine (ZANAFLEX) 4 MG tablet Take 4 mg by mouth 3 (three) times daily as needed for muscle spasms.    Yes [provider]  triamcinolone cream (KENALOG) 0.1 % Apply 1 application topically 3 (three) times daily. 08/05/17  Yes [provider]  valACYclovir (VALTREX) 1000 MG tablet Take 1 g by mouth daily as needed. For breakouts 08/05/17  Yes [provider]  venlafaxine XR (EFFEXOR-XR) 37.5 MG 24 hr capsule Take 37.5 mg by mouth daily. 07/06/17  Yes [provider]  zolpidem (AMBIEN) 10 MG tablet Take 10 mg by mouth at bedtime as needed for sleep.  01/23/17  Yes [provider]  ZOVIRAX 5 % Apply 1 application topically 3 (three) times daily.  04/14/17  Yes [provider]    Temp:  [98.2 F (36.8 C)-98.6 F (37 C)] 98.6 F (37 C) (08/12 1650) Pulse Rate:  [68-81] 79 (08/12 1910) Cardiac Rhythm: Normal sinus rhythm (08/12 1542) Resp:  [15-24] 20 (08/12 1910) BP: (186-197)/(82-115) 189/82 (08/12 1910) SpO2:  [97 %-99 %] 97 % (08/12 1910) Weight:  [136.1 kg] 136.1 kg (08/12 1259)  PHYSICAL EXAM:  BP (!) 189/82 (BP Location: Left Arm)   Pulse 79   Temp 98.6 F (37 C) (Oral)   Resp 20   Ht 5\' 7"  (1.702 m)   Wt 136.1 kg   LMP 07/28/2017 (Approximate)   SpO2 97%   BMI 46.99 kg/m   GEN morbidly obese middle-aged african-american female; resting in bed  HEENT NCAT EOM PERRL; clear oropharynx, no cervical LAD; moist mucus membranes  Neck with full range of motion intact; negative Brudzinski's JVP appears flat - note: difficult to appreciate; no carotid bruits b/l ;  CV regular normal rate; normal S1 and S2; +3/6 systolic cresc-decr murmur loudest at upper sternal border, radiating throughout  precordium; no rubs or gallops; PMI non palpable; no parasternal heave  RESP CTA b/l; diminished at bases; symmetric  ABD soft NT ND obese +normoactive BS  EXT warm throughout b/l; no peripheral edema b/l  PULSES  DP and radials intact /l  SKIN/MSK no rashes or lesions  NEURO/PSYCH  reports diminished vision in R eye (overall blurriness) without field cut; no motor focal deficits   LABS ON ADMISSION:  Basic Metabolic Panel: Recent Labs  Lab 08/17/17 1508  NA 141  K 3.4*  CL 107  CO2 27  GLUCOSE 153*  BUN 8  CREATININE 0.74  CALCIUM 8.7*   CBC: Recent Labs  Lab 08/17/17 1508  WBC 8.8  NEUTROABS 4.9  HGB 12.3  HCT 36.5  MCV 89.5  PLT 168   Liver Function Tests: No results for input(s): AST, ALT, ALKPHOS, BILITOT, PROT, ALBUMIN in the last 168 hours. No results for input(s): LIPASE, AMYLASE  in the last 168 hours. No results for input(s): AMMONIA in the last 168 hours. Coagulation:  No results found for: INR, PROTIME No results found for: PTT Lactic Acid, Venous:     Component Value Date/Time   LATICACIDVEN 1.47 10/14/2016 2316   Cardiac Enzymes: No results for input(s): CKTOTAL, CKMB, CKMBINDEX, TROPONINI in the last 168 hours.  BNP (last 3 results) No results for input(s): PROBNP in the last 8760 hours. CBG: No results for input(s): GLUCAP in the last 168 hours.  Radiological Exams on Admission: Ct Angio Head W Or Wo Contrast  Result Date: 08/17/2017 CLINICAL DATA:  Retro-orbital headache on the right. Blurred vision. EXAM: CT ANGIOGRAPHY HEAD AND NECK TECHNIQUE: Multidetector CT imaging of the head and neck was performed using the standard protocol during bolus administration of intravenous contrast. Multiplanar CT image reconstructions and MIPs were obtained to evaluate the vascular anatomy. Carotid stenosis measurements (when applicable) are obtained utilizing NASCET criteria, using the distal internal carotid diameter as the denominator. CONTRAST:  100mL  ISOVUE-370 IOPAMIDOL (ISOVUE-370) INJECTION 76% COMPARISON:  11/25/2016 FINDINGS: CT HEAD FINDINGS Brain: Normal appearance of the brain. No evidence of old or acute infarction, mass lesion, hemorrhage, hydrocephalus or extra-axial collection. Vascular: No abnormal vascular finding without contrast. Skull: Normal Sinuses: No evidence of sinusitis. Few retention cyst in the left maxillary sinus. Unerupted molar tooth of the left maxilla. Orbits: No abnormal orbital finding. Both globes appear normal. By history there is been recent procedure performed on the right, but no complication is evident related to that. Review of the MIP images confirms the above findings CTA NECK FINDINGS Aortic arch: Normal Right carotid system: Common carotid artery widely patent to the bifurcation. The carotid bifurcation is normal. Cervical internal carotid artery is normal. Left carotid system: Common carotid artery widely patent to the bifurcation. Carotid bifurcation is normal. Cervical internal carotid artery is normal. Vertebral arteries: Vertebral arteries are not well seen because of motion and patient's size. I believe the vertebral arteries are normal, widely patent at their origin and through the cervical region to the foramen magnum. Posterior circulation is somewhat diminutive secondary to fetal origin of both posterior cerebral arteries. Skeleton: Normal except for lower cervical facet osteoarthritis. Other neck: 3.7 cm mass in the low right neck, unchanged since 2017 and likely related to a thyroid nodule. Upper chest: Normal Review of the MIP images confirms the above findings CTA HEAD FINDINGS Anterior circulation: Both internal carotid arteries are patent through the skull base and siphon regions. The anterior and middle cerebral vessels appear normal without stenosis or occlusion, aneurysm or vascular malformation. No abnormal vascular finding relating to the right orbit. Posterior circulation: Diminutive posterior  circulation because of fetal origin of the posterior cerebral arteries. No evidence of posterior circulation stenosis or occlusion. Venous sinuses: Patent and normal. Anatomic variants: No abnormal variant. Delayed phase: No abnormal enhancement. Review of the MIP images confirms the above findings IMPRESSION: SOME: IMPRESSION: SOME No vascular abnormality to explain retro-orbital headache. Normal appearance of the brain. I do not discern any abnormality of the right orbit, despite the history of a recent surgical procedure. 3.7 cm mass in the low right neck presumed to derive from the thyroid gland. Has this been previously evaluated? I think the patient has had previous thyroidectomy related to goiter and this could be some residual thyroid tissue. It is unchanged since a CT scan of 2017 and therefore is likely benign. Electronically Signed   By: Scherrie BatemanMark  Shogry M.D.  On: 08/17/2017 16:52   Ct Angio Neck W And/or Wo Contrast  Result Date: 08/17/2017 CLINICAL DATA:  Retro-orbital headache on the right. Blurred vision. EXAM: CT ANGIOGRAPHY HEAD AND NECK TECHNIQUE: Multidetector CT imaging of the head and neck was performed using the standard protocol during bolus administration of intravenous contrast. Multiplanar CT image reconstructions and MIPs were obtained to evaluate the vascular anatomy. Carotid stenosis measurements (when applicable) are obtained utilizing NASCET criteria, using the distal internal carotid diameter as the denominator. CONTRAST:  ISOVUE-370 IOPAMIDOL (ISOVUE-370) INJECTION 76% COMPARISON:  11/25/2016 FINDINGS: CT HEAD FINDINGS Brain: Normal appearance of the brain. No evidence of old or acute infarction, mass lesion, hemorrhage, hydrocephalus or extra-axial collection. Vascular: No abnormal vascular finding without contrast. Skull: Normal Sinuses: No evidence of sinusitis. Few retention cyst in the left maxillary sinus. Unerupted molar tooth of the left maxilla. Orbits: No abnormal  orbital finding. Both globes appear normal. By history there is been recent procedure performed on the right, but no complication is evident related to that. Review of the MIP images confirms the above findings CTA NECK FINDINGS Aortic arch: Normal Right carotid system: Common carotid artery widely patent to the bifurcation. The carotid bifurcation is normal. Cervical internal carotid artery is normal. Left carotid system: Common carotid artery widely patent to the bifurcation. Carotid bifurcation is normal. Cervical internal carotid artery is normal. Vertebral arteries: Vertebral arteries are not well seen because of motion and patient's size. I believe the vertebral arteries are normal, widely patent at their origin and through the cervical region to the foramen magnum. Posterior circulation is somewhat diminutive secondary to fetal origin of both posterior cerebral arteries. Skeleton: Normal except for lower cervical facet osteoarthritis. Other neck: 3.7 cm mass in the low right neck, unchanged since 2017 and likely related to a thyroid nodule. Upper chest: Normal Review of the MIP images confirms the above findings CTA HEAD FINDINGS Anterior circulation: Both internal carotid arteries are patent through the skull base and siphon regions. The anterior and middle cerebral vessels appear normal without stenosis or occlusion, aneurysm or vascular malformation. No abnormal vascular finding relating to the right orbit. Posterior circulation: Diminutive posterior circulation because of fetal origin of the posterior cerebral arteries. No evidence of posterior circulation stenosis or occlusion. Venous sinuses: Patent and normal. Anatomic variants: No abnormal variant. Delayed phase: No abnormal enhancement. Review of the MIP images confirms the above findings IMPRESSION: SOME: IMPRESSION: SOME No vascular abnormality to explain retro-orbital headache. Normal appearance of the brain. I do not discern any abnormality of  the right orbit, despite the history of a recent surgical procedure. 3.7 cm mass in the low right neck presumed to derive from the thyroid gland. Has this been previously evaluated? I think the patient has had previous thyroidectomy related to goiter and this could be some residual thyroid tissue. It is unchanged since a CT scan of 2017 and therefore is likely benign. Electronically Signed   By: Paulina Fusi M.D.   On: 08/17/2017 16:52    EKG: Independently reviewed. NSR with normal ST baseline.   Assessment/Plan Sheila Gibson is a 47 y.o. female with chronic HCV, IDDM, HTN (untreated) who presented with refractory R sided headache without aura or nausea/vomiting in setting of HTN urgency -- initial labs and head and neck CTA do not suggest end-organ damage at this time but her elevated blood pressures may certainly be contributing to her headache. She also had a R eye procedure (injection) for severe macular edema and  retinopathy due to longstanding diabetes in mid July (July 18) -- either the procedure or chronic eye disease may also contribute to her headache although timing would be unusual and there is no significant abnormalities on CTA head of bilateral orbits. Patient previously has had prior ED visit for tension headache that resolved with a migraine cocktail. At this time, she is not prescribed any medications for HTN. Plan for symptomatic management for migraine and BP control. Exam is also significant for a loud systolic murmur at upper sternal border; will check echocardiogram on this admission especially given uncontrolled HTN. Needs new PCP on discharge   # Severe refractory headache - repeat migraine cocktail with reglan, benadryl, toradol tonight (received first doses in ED) - BP control as below - if develops new vision changes or worsening headache in same R laterality, consult ophthalmology  # Hypertensive urgency (not on any home meds) - starting losartan and amlodipine  tonight and uptitrate tomorrow as needed - BP systolic goal 150s by midnight - may add hctz tomorrow if BP remain elevated but only if K remains stable - needs to establish PCP follow up  # Systolic murmur. Patient reports as chronic but no prior workup - TTE ordered  # Hypokalemia K 3.4 on admission - repleted K overnight - caution with HCTZ as above  # Hx of chronic HCV  - resume tenofovir   # IDDM and hx of diabetic retinopathy > at home on lantus 60 units BID and mealtime 6 units - resuming lantus at 50 units BID for now - mod sliding scale AC and fingersticks ACHS - carb controlled diet  # Diabetic neuropathy and muscle spasms - resume gabapentin and tizanidine    DVT Prophylaxis: lovenox  Code Status:  Full Code Family Communication: none  Disposition Plan:  Home pending BP control and headache improvement Extended Emergency Contact Information Primary Emergency Contact: [facility] Mobile Phone: [phone] Relation: Son  Time spent: > 55 minutes  Ike Bene, MD [facility] Pager [phone]  If 7PM-7AM, please contact night-coverage [email or web address] Password [facility] 08/17/2017, 7:31 PM

## 2017-08-17 NOTE — ED Notes (Signed)
ED TO INPATIENT HANDOFF REPORT  Name/Age/Gender Sheila Gibson 47 y.o. female  Code Status    Code Status Orders  (From admission, onward)         Start     Ordered   08/17/17 1922  Full code  Continuous     08/17/17 1928        Code Status History    This patient has a current code status but no historical code status.      Home/SNF/Other Home  Chief Complaint headache  Level of Care/Admitting Diagnosis ED Disposition    ED Disposition Condition Comment   Admit  Hospital Area: [facility] [[id number]]  Level of Care: Med-Surg [16]  Diagnosis: Hypertensive urgency [863817]  Admitting Physician: Colbert Ewing [[id number]]  Attending Physician: Colbert Ewing [[id number]]  PT Class (Do Not Modify): Observation [104]  PT Acc Code (Do Not Modify): Observation [10022]       Medical History Past Medical History:  Diagnosis Date  . COPD (chronic obstructive pulmonary disease) ([place])   . Depression   . Diabetes mellitus without complication ([place])   . Heart murmur   . Hepatic cirrhosis ([place])   . Hypertension   . Obesity   . Thyroid disease     Allergies No Known Allergies  IV Location/Drains/Wounds Patient Lines/Drains/Airways Status   Active Line/Drains/Airways    Name:   Placement date:   Placement time:   Site:   Days:   Peripheral IV 08/17/17 Antecubital   08/17/17    1502    Antecubital   less than 1          Labs/Imaging Results for orders placed or performed during the hospital encounter of 08/17/17 (from the past 48 hour(s))  CBC with Differential     Status: None   Collection Time: 08/17/17  3:08 PM  Result Value Ref Range   WBC 8.8 4.0 - 10.5 K/uL   RBC 4.08 3.87 - 5.11 MIL/uL   Hemoglobin 12.3 12.0 - 15.0 g/dL   HCT 36.5 36.0 - 46.0 %   MCV 89.5 78.0 - 100.0 fL   MCH 30.1 26.0 - 34.0 pg   MCHC 33.7 30.0 - 36.0 g/dL   RDW 14.2 11.5 - 15.5 %   Platelets 168 150 - 400 K/uL   Neutrophils Relative % 55 %   Neutro Abs  4.9 1.7 - 7.7 K/uL   Lymphocytes Relative 35 %   Lymphs Abs 3.1 0.7 - 4.0 K/uL   Monocytes Relative 8 %   Monocytes Absolute 0.7 0.1 - 1.0 K/uL   Eosinophils Relative 2 %   Eosinophils Absolute 0.2 0.0 - 0.7 K/uL   Basophils Relative 0 %   Basophils Absolute 0.0 0.0 - 0.1 K/uL    Comment: Performed at [facility], [place] [address], [place], [place] [address]  Basic metabolic panel     Status: Abnormal   Collection Time: 08/17/17  3:08 PM  Result Value Ref Range   Sodium 141 135 - 145 mmol/L   Potassium 3.4 (L) 3.5 - 5.1 mmol/L   Chloride 107 98 - 111 mmol/L   CO2 27 22 - 32 mmol/L   Glucose, Bld 153 (H) 70 - 99 mg/dL   BUN 8 6 - 20 mg/dL   Creatinine, Ser 0.74 0.44 - 1.00 mg/dL   Calcium 8.7 (L) 8.9 - 10.3 mg/dL   GFR calc non Af Amer >60 >60 mL/min   GFR calc Af Amer >60 >60 mL/min  Comment: (NOTE) The eGFR has been calculated using the CKD EPI equation. This calculation has not been validated in all clinical situations. eGFR's persistently <60 mL/min signify possible Chronic Kidney Disease.    Anion gap 7 5 - 15    Comment: Performed at [facility], [place] [address] [place], [place] [address]  I-Stat beta hCG blood, ED ([facility], [facility], [facility] only)     Status: None   Collection Time: 08/17/17  3:23 PM  Result Value Ref Range   I-stat hCG, quantitative <5.0 <5 mIU/mL   Comment 3            Comment:   GEST. AGE      CONC.  (mIU/mL)   <=1 WEEK        5 - 50     2 WEEKS       50 - 500     3 WEEKS       100 - 10,000     4 WEEKS     1,000 - 30,000        FEMALE AND NON-PREGNANT FEMALE:     LESS THAN 5 mIU/mL    Ct Angio Head W Or Wo Contrast  Result Date: 08/17/2017 CLINICAL DATA:  Retro-orbital headache on the right. Blurred vision. EXAM: CT ANGIOGRAPHY HEAD AND NECK TECHNIQUE: Multidetector CT imaging of the head and neck was performed using the standard protocol during bolus administration of intravenous contrast. Multiplanar CT image  reconstructions and MIPs were obtained to evaluate the vascular anatomy. Carotid stenosis measurements (when applicable) are obtained utilizing NASCET criteria, using the distal internal carotid diameter as the denominator. CONTRAST:  134m ISOVUE-370 IOPAMIDOL (ISOVUE-370) INJECTION 76% COMPARISON:  11/25/2016 FINDINGS: CT HEAD FINDINGS Brain: Normal appearance of the brain. No evidence of old or acute infarction, mass lesion, hemorrhage, hydrocephalus or extra-axial collection. Vascular: No abnormal vascular finding without contrast. Skull: Normal Sinuses: No evidence of sinusitis. Few retention cyst in the left maxillary sinus. Unerupted molar tooth of the left maxilla. Orbits: No abnormal orbital finding. Both globes appear normal. By history there is been recent procedure performed on the right, but no complication is evident related to that. Review of the MIP images confirms the above findings CTA NECK FINDINGS Aortic arch: Normal Right carotid system: Common carotid artery widely patent to the bifurcation. The carotid bifurcation is normal. Cervical internal carotid artery is normal. Left carotid system: Common carotid artery widely patent to the bifurcation. Carotid bifurcation is normal. Cervical internal carotid artery is normal. Vertebral arteries: Vertebral arteries are not well seen because of motion and patient's size. I believe the vertebral arteries are normal, widely patent at their origin and through the cervical region to the foramen magnum. Posterior circulation is somewhat diminutive secondary to fetal origin of both posterior cerebral arteries. Skeleton: Normal except for lower cervical facet osteoarthritis. Other neck: 3.7 cm mass in the low right neck, unchanged since 2017 and likely related to a thyroid nodule. Upper chest: Normal Review of the MIP images confirms the above findings CTA HEAD FINDINGS Anterior circulation: Both internal carotid arteries are patent through the skull base and  siphon regions. The anterior and middle cerebral vessels appear normal without stenosis or occlusion, aneurysm or vascular malformation. No abnormal vascular finding relating to the right orbit. Posterior circulation: Diminutive posterior circulation because of fetal origin of the posterior cerebral arteries. No evidence of posterior circulation stenosis or occlusion. Venous sinuses: Patent and normal. Anatomic variants: No abnormal variant. Delayed phase: No  abnormal enhancement. Review of the MIP images confirms the above findings IMPRESSION: SOME: IMPRESSION: SOME No vascular abnormality to explain retro-orbital headache. Normal appearance of the brain. I do not discern any abnormality of the right orbit, despite the history of a recent surgical procedure. 3.7 cm mass in the low right neck presumed to derive from the thyroid gland. Has this been previously evaluated? I think the patient has had previous thyroidectomy related to goiter and this could be some residual thyroid tissue. It is unchanged since a CT scan of 2017 and therefore is likely benign. Electronically Signed   By: Nelson Chimes M.D.   On: 08/17/2017 16:52   Ct Angio Neck W And/or Wo Contrast  Result Date: 08/17/2017 CLINICAL DATA:  Retro-orbital headache on the right. Blurred vision. EXAM: CT ANGIOGRAPHY HEAD AND NECK TECHNIQUE: Multidetector CT imaging of the head and neck was performed using the standard protocol during bolus administration of intravenous contrast. Multiplanar CT image reconstructions and MIPs were obtained to evaluate the vascular anatomy. Carotid stenosis measurements (when applicable) are obtained utilizing NASCET criteria, using the distal internal carotid diameter as the denominator. CONTRAST:  11m ISOVUE-370 IOPAMIDOL (ISOVUE-370) INJECTION 76% COMPARISON:  11/25/2016 FINDINGS: CT HEAD FINDINGS Brain: Normal appearance of the brain. No evidence of old or acute infarction, mass lesion, hemorrhage, hydrocephalus or  extra-axial collection. Vascular: No abnormal vascular finding without contrast. Skull: Normal Sinuses: No evidence of sinusitis. Few retention cyst in the left maxillary sinus. Unerupted molar tooth of the left maxilla. Orbits: No abnormal orbital finding. Both globes appear normal. By history there is been recent procedure performed on the right, but no complication is evident related to that. Review of the MIP images confirms the above findings CTA NECK FINDINGS Aortic arch: Normal Right carotid system: Common carotid artery widely patent to the bifurcation. The carotid bifurcation is normal. Cervical internal carotid artery is normal. Left carotid system: Common carotid artery widely patent to the bifurcation. Carotid bifurcation is normal. Cervical internal carotid artery is normal. Vertebral arteries: Vertebral arteries are not well seen because of motion and patient's size. I believe the vertebral arteries are normal, widely patent at their origin and through the cervical region to the foramen magnum. Posterior circulation is somewhat diminutive secondary to fetal origin of both posterior cerebral arteries. Skeleton: Normal except for lower cervical facet osteoarthritis. Other neck: 3.7 cm mass in the low right neck, unchanged since 2017 and likely related to a thyroid nodule. Upper chest: Normal Review of the MIP images confirms the above findings CTA HEAD FINDINGS Anterior circulation: Both internal carotid arteries are patent through the skull base and siphon regions. The anterior and middle cerebral vessels appear normal without stenosis or occlusion, aneurysm or vascular malformation. No abnormal vascular finding relating to the right orbit. Posterior circulation: Diminutive posterior circulation because of fetal origin of the posterior cerebral arteries. No evidence of posterior circulation stenosis or occlusion. Venous sinuses: Patent and normal. Anatomic variants: No abnormal variant. Delayed phase:  No abnormal enhancement. Review of the MIP images confirms the above findings IMPRESSION: SOME: IMPRESSION: SOME No vascular abnormality to explain retro-orbital headache. Normal appearance of the brain. I do not discern any abnormality of the right orbit, despite the history of a recent surgical procedure. 3.7 cm mass in the low right neck presumed to derive from the thyroid gland. Has this been previously evaluated? I think the patient has had previous thyroidectomy related to goiter and this could be some residual thyroid tissue. It is unchanged  since a CT scan of 2017 and therefore is likely benign. Electronically Signed   By: Nelson Chimes M.D.   On: 08/17/2017 16:52    Pending Labs Unresulted Labs (From admission, onward)    Start     Ordered   08/24/17 0500  Creatinine, serum  (enoxaparin (LOVENOX)    CrCl >/= 30 ml/min)  Weekly,   R    Comments:  while on enoxaparin therapy    08/17/17 1928   08/18/17 0500  CBC  Daily,   R     08/17/17 1928   08/17/17 2224  Basic metabolic panel  Once,   R     08/17/17 1928   08/17/17 1920  CBC  (enoxaparin (LOVENOX)    CrCl >/= 30 ml/min)  Once,   R    Comments:  Baseline for enoxaparin therapy IF NOT ALREADY DRAWN.  Notify MD if PLT < 100 K.    08/17/17 1928          Vitals/Pain Today's Vitals   08/17/17 1909 08/17/17 1910 08/17/17 1930 08/17/17 2002  BP: (!) 189/82 (!) 189/82 (!) 193/95 (!) 162/93  Pulse: 84 79 83 95  Resp:  _0 Temp:    98.7 F (37.1 C)  TempSrc:    Oral  SpO2: 100% 97% 98%   Weight:      Height:      PainSc:        Isolation Precautions No active isolations  Medications Medications  iopamidol (ISOVUE-370) 76 % injection (has no administration in time range)  tenofovir (VIREAD) tablet 300 mg (has no administration in time range)  venlafaxine XR (EFFEXOR-XR) 24 hr capsule 37.5 mg (has no administration in time range)  zolpidem (AMBIEN) tablet 10 mg (has no administration in time range)  insulin glargine  (LANTUS) Solostar Pen 50 Units (has no administration in time range)  levothyroxine (SYNTHROID, LEVOTHROID) tablet 50 mcg (has no administration in time range)  gabapentin (NEURONTIN) tablet 800 mg (has no administration in time range)  tiZANidine (ZANAFLEX) tablet 4 mg (has no administration in time range)  enoxaparin (LOVENOX) injection 40 mg (has no administration in time range)  acetaminophen (TYLENOL) tablet 650 mg (has no administration in time range)  losartan (COZAAR) tablet 50 mg (has no administration in time range)  potassium citrate (UROCIT-K) SR tablet 30 mEq (has no administration in time range)  insulin aspart (novoLOG) injection 0-15 Units (has no administration in time range)  amLODipine (NORVASC) tablet 10 mg (has no administration in time range)  metoCLOPramide (REGLAN) injection 10 mg (has no administration in time range)    And  diphenhydrAMINE (BENADRYL) injection 25 mg (has no administration in time range)    And  sodium chloride 0.9 % bolus 1,000 mL (has no administration in time range)    And  ketorolac (TORADOL) 30 MG/ML injection 15 mg (has no administration in time range)  acetaminophen (TYLENOL) tablet 1,000 mg (has no administration in time range)  tetracaine (PONTOCAINE) 0.5 % ophthalmic solution 2 drop (2 drops Right Eye Given 08/17/17 1453)  hydrochlorothiazide (HYDRODIURIL) tablet 25 mg (25 mg Oral Given 08/17/17 1520)  metoCLOPramide (REGLAN) injection 10 mg (10 mg Intravenous Given 08/17/17 1451)    And  diphenhydrAMINE (BENADRYL) injection 25 mg (25 mg Intravenous Given 08/17/17 1451)    And  sodium chloride 0.9 % bolus 1,000 mL (0 mLs Intravenous Stopped 08/17/17 1603)    And  ketorolac (TORADOL) 30 MG/ML injection 15 mg (15 mg Intravenous  Given 08/17/17 1451)  iopamidol (ISOVUE-370) 76 % injection 100 mL (100 mLs Intravenous Contrast Given 08/17/17 1619)  hydrALAZINE (APRESOLINE) injection 10 mg (10 mg Intravenous Given 08/17/17 1847)  magnesium sulfate  IVPB 2 g 50 mL (0 g Intravenous Stopped 08/17/17 1911)    Mobility walks

## 2017-08-17 NOTE — ED Notes (Signed)
Tried calling report, Floor says cant call report until 2115 when the nurse arrives to floor.

## 2017-08-17 NOTE — ED Triage Notes (Signed)
Pt reports that she has a HA 10/10 pain throbbing.

## 2017-08-17 NOTE — ED Notes (Signed)
We cannot take patient until on-call nurse arrives per charge RN Elijah Birkom

## 2017-08-17 NOTE — ED Triage Notes (Signed)
Pt presents with pain in her head, behind the right eye, and neck.  Pt states she had a procedure done in which pressure was removed from the right eye about 4 weeks ago.  Over the past two weeks, pt has been developing blurriness in the eye and pt states she sees black circles in that eye.  Pt reports nausea.  Pt a/o x 4 and ambulatory.

## 2017-08-18 ENCOUNTER — Observation Stay (HOSPITAL_BASED_OUTPATIENT_CLINIC_OR_DEPARTMENT_OTHER): Payer: Medicaid Other

## 2017-08-18 DIAGNOSIS — I5030 Unspecified diastolic (congestive) heart failure: Secondary | ICD-10-CM | POA: Diagnosis not present

## 2017-08-18 DIAGNOSIS — Z794 Long term (current) use of insulin: Secondary | ICD-10-CM

## 2017-08-18 DIAGNOSIS — E11319 Type 2 diabetes mellitus with unspecified diabetic retinopathy without macular edema: Secondary | ICD-10-CM | POA: Diagnosis not present

## 2017-08-18 DIAGNOSIS — R51 Headache: Secondary | ICD-10-CM | POA: Diagnosis not present

## 2017-08-18 DIAGNOSIS — I160 Hypertensive urgency: Secondary | ICD-10-CM | POA: Diagnosis not present

## 2017-08-18 LAB — ECHOCARDIOGRAM COMPLETE
Height: 67 in
Weight: 4768 oz

## 2017-08-18 LAB — CBC
HEMATOCRIT: 35.4 % — AB (ref 36.0–46.0)
HEMOGLOBIN: 11.9 g/dL — AB (ref 12.0–15.0)
MCH: 29.8 pg (ref 26.0–34.0)
MCHC: 33.6 g/dL (ref 30.0–36.0)
MCV: 88.7 fL (ref 78.0–100.0)
Platelets: 174 10*3/uL (ref 150–400)
RBC: 3.99 MIL/uL (ref 3.87–5.11)
RDW: 14.3 % (ref 11.5–15.5)
WBC: 10.7 10*3/uL — ABNORMAL HIGH (ref 4.0–10.5)

## 2017-08-18 LAB — GLUCOSE, CAPILLARY
GLUCOSE-CAPILLARY: 211 mg/dL — AB (ref 70–99)
Glucose-Capillary: 146 mg/dL — ABNORMAL HIGH (ref 70–99)

## 2017-08-18 MED ORDER — LOSARTAN POTASSIUM 50 MG PO TABS: 50.0000 mg | ORAL_TABLET | Freq: Every day | ORAL | 0 refills | Status: DC

## 2017-08-18 MED ORDER — AMLODIPINE BESYLATE 10 MG PO TABS
10.0000 mg | ORAL_TABLET | Freq: Every day | ORAL | Status: DC
  Administered 2017-08-18: 10 mg via ORAL
  Filled 2017-08-18: qty 1

## 2017-08-18 MED ORDER — AMLODIPINE BESYLATE 10 MG PO TABS: 10.0000 mg | ORAL_TABLET | Freq: Every day | ORAL | 0 refills | Status: DC

## 2017-08-18 NOTE — Progress Notes (Signed)
[facility] - Clinic Note  08/19/2017     CHIEF COMPLAINT Patient presents for Retina Follow Up   HISTORY OF PRESENT ILLNESS: Sheila Gibson is a 47 y.o. female who presents to the clinic today for:   HPI    Retina Follow Up    Patient presents with  Diabetic Retinopathy.  In both eyes.  This started 1 month ago.  Severity is mild.  Since onset it is gradually improving.  I, the attending physician,  performed the HPI with the patient and updated documentation appropriately.          Comments    F/U NPDR OU. Patient states her vision has improved, denies flashing lights OD. Pt was hospitalized Monday (08/17/17) for hypertension and HA's. Bp is much better per patient.B/P 106/68 P87  Bs 140 and has been WINL per patient. Patient ready for tx if indicated today.       Last edited by Rennis ChrisZamora, Daily Crate, MD on 08/21/2017 12:30 AM. (History)    Pt states   Referring physician: No referring provider defined for this encounter.  HISTORICAL INFORMATION:   Selected notes from the MEDICAL RECORD NUMBER Referred by Dr. Aura CampsMichael Spencer for concern of PDR and CSME OU LEE: 06.24.19 Kirk Ruths(M. Spencer) [BCVA: OD: 20/60-2 OS: 20/25] Ocular Hx-DME OU, PDR OU, cataract OU PMH-DM (taking Lantus and Novolog), Thyroid disease, HTN    CURRENT MEDICATIONS: No current outpatient medications on file. (Ophthalmic Drugs)   No current facility-administered medications for this visit.  (Ophthalmic Drugs)   Current Outpatient Medications (Other)  Medication Sig  . acetaminophen (TYLENOL) 500 MG tablet Take 500 mg by mouth daily as needed for moderate pain.  Marland Kitchen. albuterol (PROVENTIL HFA;VENTOLIN HFA) 108 (90 BASE) MCG/ACT inhaler Inhale 1-2 puffs into the lungs every 6 (six) hours as needed for wheezing or shortness of breath.  Marland Kitchen. amLODipine (NORVASC) 10 MG tablet Take 1 tablet (10 mg total) by mouth daily.  Marland Kitchen. gabapentin (NEURONTIN) 800 MG tablet Take 800 mg by mouth 3 (three) times daily.    . hydrOXYzine (ATARAX/VISTARIL) 25 MG tablet Take 25 mg by mouth 3 (three) times daily as needed. for anxiety  . ibuprofen (ADVIL,MOTRIN) 800 MG tablet Take 800 mg by mouth every 8 (eight) hours as needed for moderate pain.  Marland Kitchen. insulin aspart protamine- aspart (NOVOLOG MIX 70/30) (70-30) 100 UNIT/ML injection Inject 6 Units into the skin 2 (two) times daily with a meal.  . insulin glargine (LANTUS) 100 unit/mL SOPN Inject 60 Units into the skin 2 (two) times daily.   Marland Kitchen. levothyroxine (SYNTHROID, LEVOTHROID) 50 MCG tablet Take 50 mcg by mouth daily.   Marland Kitchen. losartan (COZAAR) 50 MG tablet Take 1 tablet (50 mg total) by mouth daily.  . ondansetron (ZOFRAN) 4 MG tablet Take 4 mg by mouth daily as needed for nausea or vomiting.  Marland Kitchen. tenofovir (VIREAD) 300 MG tablet Take 300 mg by mouth daily.  Marland Kitchen. tiZANidine (ZANAFLEX) 4 MG tablet Take 4 mg by mouth 3 (three) times daily as needed for muscle spasms.   Marland Kitchen. triamcinolone cream (KENALOG) 0.1 % Apply 1 application topically 3 (three) times daily.  . valACYclovir (VALTREX) 1000 MG tablet Take 1 g by mouth daily as needed. For breakouts  . venlafaxine XR (EFFEXOR-XR) 37.5 MG 24 hr capsule Take 37.5 mg by mouth daily.  Marland Kitchen. zolpidem (AMBIEN) 10 MG tablet Take 10 mg by mouth at bedtime as needed for sleep.   Marland Kitchen. ZOVIRAX 5 % Apply 1 application topically  3 (three) times daily.    Current Facility-Administered Medications (Other)  Medication Route  . Bevacizumab (AVASTIN) SOLN 1.25 mg Intravitreal  . Bevacizumab (AVASTIN) SOLN 1.25 mg Intravitreal      REVIEW OF SYSTEMS: ROS    Positive for: Endocrine, Eyes   Negative for: Constitutional, Gastrointestinal, Neurological, Skin, Genitourinary, Musculoskeletal, HENT, Cardiovascular, Respiratory, Psychiatric, Allergic/Imm, Heme/Lymph   Last edited by Eldridge ScotKendrick, Glenda, LPN on 1/61/09608/14/2019  2:39 PM. (History)       ALLERGIES No Known Allergies  PAST MEDICAL HISTORY Past Medical History:  Diagnosis Date  . COPD  (chronic obstructive pulmonary disease) (HCC)   . Depression   . Diabetes mellitus without complication (HCC)   . Heart murmur   . Hepatic cirrhosis (HCC)   . Hypertension   . Obesity   . Thyroid disease    Past Surgical History:  Procedure Laterality Date  . CESAREAN SECTION    . THYROIDECTOMY      FAMILY HISTORY Family History  Problem Relation Age of Onset  . Cancer Mother   . Hypertension Other     SOCIAL HISTORY Social History   Tobacco Use  . Smoking status: Never Smoker  . Smokeless tobacco: Never Used  Substance Use Topics  . Alcohol use: Not Currently  . Drug use: No         OPHTHALMIC EXAM:  Base Eye Exam    Visual Acuity (Snellen - Linear)      Right Left   Dist [place] 20/100 20/40   Dist ph [place] 20/60 +2 NI       Tonometry (Tonopen, 2:41 PM)      Right Left   Pressure 18 17       Pupils      Dark Light Shape React APD   Right 4 3 Round Brisk None   Left 4 3 Round Brisk None       Visual Fields (Counting fingers)      Left Right    Full        Extraocular Movement      Right Left    Full, Ortho Full, Ortho       Neuro/Psych    Oriented x3:  Yes   Mood/Affect:  Normal       Dilation    Both eyes:  1.0% Mydriacyl, 2.5% Phenylephrine @ 2:40 PM        Slit Lamp and Fundus Exam    Slit Lamp Exam      Right Left   Lids/Lashes Dermatochalasis - upper lid Dermatochalasis - upper lid   Conjunctiva/Sclera Melanosis Melanosis   Cornea Arcus Arcus   Anterior Chamber Deep and quiet Deep and quiet   Iris Round and dilated Round and dilated   Lens 1+ Nuclear sclerosis, 2+ Cortical cataract 1+ Nuclear sclerosis, 2+ Cortical cataract   Vitreous Trace Vitreous syneresis Trace Vitreous syneresis       Fundus Exam      Right Left   Disc Pink and Sharp Pink and Sharp   C/D Ratio 0.3 0.6   Macula Blunted foveal reflex, +central edema/SRF -- interval improvement, Exudates, Intraretinal hemorrhage, CWS Blunted foveal reflex, mild thickening  superior to fovea, scattered Microaneurysms, scattered Intraretinal hemorrhage   Vessels Dilated and Tortuous veins, +beading, Copper wiring, ? Flat NV along arcades Dilated and Tortuous veins, +beading, Copper wiring, ? Flat NV along arcades   Periphery Attached, +IRH, +CWS Attached, 360 IRH and CWS superiorly and around nerve  IMAGING AND PROCEDURES  Imaging and Procedures for @TODAY @  Intravitreal Injection, Pharmacologic Agent - OD - Right Eye       Time Out 08/19/2017. 3:47 PM. Confirmed correct patient, procedure, site, and patient consented.   Anesthesia Topical anesthesia was used. Anesthetic medications included Lidocaine 2%, Tetracaine 0.5%.   Procedure Preparation included 5% betadine to ocular surface, eyelid speculum. A supplied needle was used.   Injection:  1.25 mg Bevacizumab 1.25mg /0.65ml   NDC: 81191-478-29, Lot: [id number] , Expiration date: 09/09/2017   Route: Intravitreal, Site: Right Eye, Waste: 0 mg  Post-op Post injection exam found visual acuity of at least counting fingers. The patient tolerated the procedure well. There were no complications. The patient received written and verbal post procedure care education.        OCT, Retina - OU - Both Eyes       Right Eye Quality was good. Central Foveal Thickness: 397. Progression has improved. Findings include abnormal foveal contour, intraretinal fluid, subretinal fluid (Interval improvement in IRF/SRF).   Left Eye Quality was good. Central Foveal Thickness: 273. Progression has improved. Findings include normal foveal contour, intraretinal fluid, no SRF (Interval improvement in IRF).   Notes *Images captured and stored on drive  Diagnosis / Impression:  OD: +central DME -- interval improvement in IRF/SRF OS: +DME superior to fovea -- mild interval improvement in IRF  Clinical management:  See below  Abbreviations: NFP - Normal foveal profile. CME - cystoid macular edema. PED - pigment  epithelial detachment. IRF - intraretinal fluid. SRF - subretinal fluid. EZ - ellipsoid zone. ERM - epiretinal membrane. ORA - outer retinal atrophy. ORT - outer retinal tubulation. SRHM - subretinal hyper-reflective material                  ASSESSMENT/PLAN:    ICD-10-CM   1. Severe nonproliferative diabetic retinopathy of both eyes with macular edema associated with type 2 diabetes mellitus (HCC) O16.0737 Intravitreal Injection, Pharmacologic Agent - OD - Right Eye    OCT, Retina - OU - Both Eyes    Bevacizumab (AVASTIN) SOLN 1.25 mg  2. Retinal edema H35.81 OCT, Retina - OU - Both Eyes  3. Essential hypertension I10   4. Hypertensive retinopathy of both eyes H35.033   5. 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 Severe Non-proliferative diabetic retinopathy, OU - The incidence, risk factors for progression, natural history and treatment options for diabetic retinopathy  were discussed with patient.   - The need for close monitoring of blood glucose, blood pressure, and serum lipids, avoiding cigarette or any type of tobacco, and the need for long term follow up was also discussed with patient. - exam with extensive 360 IRH, CWS and venous beading OU - FA shows late leaking MAs OU, No NV - OCT shows diabetic macular edema, OU (OD > OS, OS non-central) - S/P IVA OD #1 (07.18.19) - OCT shows interval improvement in DME OU - BCVA improved to 20/60 from 2/80 OD - recommend IVA #2 OD today, 8.14.19 - pt wishes to proceed - RBA of procedure discussed, questions answered - informed consent obtained and signed - see procedure note - f/u in 4 wks -- DFE / OCT / possible injection vs focal laser  3,4. Hypertensive retinopathy OU - discussed importance of tight BP control - monitor  5. Combined form age related cataract OU-  - The symptoms of cataract, surgical options, and treatments and risks were discussed with patient. -  discussed diagnosis and  progression - not yet visually significant - monitor for now   Ophthalmic Meds Ordered this visit:  Meds ordered this encounter  Medications  . Bevacizumab (AVASTIN) SOLN 1.25 mg       Return in about 4 weeks (around 09/16/2017) for F/U NPDR OU, DFE, OCT.  There are no Patient Instructions on file for this visit.  This document serves as a record of services personally performed by Karie Chimera, MD, PhD. It was created on their behalf by Laurian Brim, OA, an ophthalmic assistant. The creation of this record is the provider's dictation and/or activities during the visit.     Electronically signed by: Laurian Brim, OA  08.12.2019 12:31 AM    Karie Chimera, M.D., Ph.D. Diseases & Surgery of the Retina and Vitreous [facility]   I have reviewed the above documentation for accuracy and completeness, and I agree with the above. Karie Chimera, M.D., Ph.D. 07/23/17 12:31 AM   Abbreviations: M myopia (nearsighted); A astigmatism; H hyperopia (farsighted); P presbyopia; Mrx spectacle prescription;  CTL contact lenses; OD right eye; OS left eye; OU both eyes  XT exotropia; ET esotropia; PEK punctate epithelial keratitis; PEE punctate epithelial erosions; DES dry eye syndrome; MGD meibomian gland dysfunction; ATs artificial tears; PFAT's preservative free artificial tears; NSC nuclear sclerotic cataract; PSC posterior subcapsular cataract; ERM epi-retinal membrane; PVD posterior vitreous detachment; RD retinal detachment; DM diabetes mellitus; DR diabetic retinopathy; NPDR non-proliferative diabetic retinopathy; PDR proliferative diabetic retinopathy; CSME clinically significant macular edema; DME diabetic macular edema; dbh dot blot hemorrhages; CWS cotton wool spot; POAG primary open angle glaucoma; C/D cup-to-disc ratio; HVF humphrey visual field; GVF goldmann visual field; OCT optical coherence tomography; IOP intraocular pressure; BRVO Branch retinal vein  occlusion; CRVO central retinal vein occlusion; CRAO central retinal artery occlusion; BRAO branch retinal artery occlusion; RT retinal tear; SB scleral buckle; PPV pars plana vitrectomy; VH Vitreous hemorrhage; PRP panretinal laser photocoagulation; IVK intravitreal kenalog; VMT vitreomacular traction; MH Macular hole;  NVD neovascularization of the disc; NVE neovascularization elsewhere; AREDS age related eye disease study; ARMD age related macular degeneration; POAG primary open angle glaucoma; EBMD epithelial/anterior basement membrane dystrophy; ACIOL anterior chamber intraocular lens; IOL intraocular lens; PCIOL posterior chamber intraocular lens; Phaco/IOL phacoemulsification with intraocular lens placement; PRK photorefractive keratectomy; LASIK laser assisted in situ keratomileusis; HTN hypertension; DM diabetes mellitus; COPD chronic obstructive pulmonary disease

## 2017-08-18 NOTE — Discharge Summary (Addendum)
Discharge Summary  Sheila Gibson ZOX:[id number] DOB: 09-28-1970  PCP: Patient, No Pcp Per  Admit date: 08/17/2017 Discharge date: 08/18/2017  Time spent: 30 mins  Recommendations for Outpatient Follow-up:  1. Encouraged to establish care with PCP to follow up on her BP 2. Follow up with Ophthalmology- reports she has an appointment on 08/19/17  Discharge Diagnoses:  Active Hospital Problems   Diagnosis Date Noted  . Hypertensive urgency 08/17/2017    Resolved Hospital Problems  No resolved problems to display.    Discharge Condition: Stable   Diet recommendation: Heart healthy/Mod carb  Vitals:   08/18/17 0029 08/18/17 0519  BP: (!) 141/61 (!) 154/86  Pulse: 98 (!) 102  Resp: (!) 22 (!) 22  Temp: 98.5 F (36.9 C) 98.7 F (37.1 C)  SpO2: 92% 92%    History of present illness:  Sheila Gibson is a 47 y.o. female with hx of chronic HCV, IDDM, HTN (untreated) who presented with refractory frontal headache since Saturday (2 days ago prior to coming to ED). Patient reports waking up with a headache(no relief of OTC meds), described as constant and throbbing. Denies any aura or nausea/vomiting. No focal neurological deficits. No new vision changes although at baseline has severely reduced R eyesight secondary to diabetic retinopathy. Denies flashing lights or floaters or any sensation of a curtain/visual field cut. She did have an injection in the R eye for macular degeneration on 7/18 -- and she has been having blurry vision in the R eye for past several weeks, preceding the headache. Denies photophobia or neck stiffness. Pt went to an outside provider (at drug recovery program) where vitals check revealed SBP in 180s -- then instructed to come to the ED. She is currently not prescribed any migraine therapies or meds for hypertension, although she has been told previously that she is hypertensive. Pt admitted for further management  Today, pt reported headache is much better,  feels better overall. Denies any worsening eye changes/blurry vision, chest pain, SOB, abdominal pain, N/V, fever/chills. No focal neurologic deficit noted. Pt stable for d/c with close follow up with PCP  Hospital Course:  Active Problems:   Hypertensive urgency  Severe refractory headache Improving Likely due to uncontrolled HTN Vs ?Migraine CTA head/neck showed: No vascular abnormality to explain retro-orbital headache. Normal appearance of the brain. No noted abnormality of the right orbit, despite the history of a recent surgical procedure Advised pt to adhere to BP meds, for better control. If headache persists despite BP controlled, may consider migraines  Hypertensive urgency Better control, no prior home meds Will d/c pt on losartan and amlodipine ECHO showed EF of 65-70%, grade 1 DD  Pt strongly advised to establish care with a PCP to follow up on BP and titrate meds  Systolic murmur Patient reports present at birth ECHO as above  Hypokalemia Replace prn  Hx of chronic HCV  Continue tenofovir   IDDM and hx of diabetic retinopathy/neuropathy Continue home regimen, insulin, gabapentin  Hx of Polysubstance abuse Pt reports she used heroin in her 24s, and quit cold [place]. Also used cocaine, last use in Feb 2019 and attends a drug rehab program 5 days a week (currently graduated from the rehab program) Quit drinking alcohol as well Advised and encouraged to continue to abstain   Morbid Obesity Advised lifestyle modification    Procedures:  None   Consultations:  None   Discharge Exam: BP (!) 154/86 (BP Location: Left Arm)   Pulse (!) 102   Temp  98.7 F (37.1 C) (Oral)   Resp (!) 22   Ht 5\' 7"  (1.702 m)   Wt 135.2 kg   LMP 07/28/2017 (Approximate)   SpO2 92%   BMI 46.67 kg/m   General: NAD Cardiovascular: S1, S2 present, murmur present Respiratory: CTAB  Discharge Instructions You were cared for by a hospitalist during your hospital stay. If  you have any questions about your discharge medications or the care you received while you were in the hospital after you are discharged, you can call the unit and asked to speak with the hospitalist on call if the hospitalist that took care of you is not available. Once you are discharged, your primary care physician will handle any further medical issues. Please note that NO REFILLS for any discharge medications will be authorized once you are discharged, as it is imperative that you return to your primary care physician (or establish a relationship with a primary care physician if you do not have one) for your aftercare needs so that they can reassess your need for medications and monitor your lab values.   Allergies as of 08/18/2017   No Known Allergies     Medication List    TAKE these medications   acetaminophen 500 MG tablet Commonly known as:  TYLENOL Take 500 mg by mouth daily as needed for moderate pain.   albuterol 108 (90 Base) MCG/ACT inhaler Commonly known as:  PROVENTIL HFA;VENTOLIN HFA Inhale 1-2 puffs into the lungs every 6 (six) hours as needed for wheezing or shortness of breath.   amLODipine 10 MG tablet Commonly known as:  NORVASC Take 1 tablet (10 mg total) by mouth daily. Start taking on:  08/19/2017   gabapentin 800 MG tablet Commonly known as:  NEURONTIN Take 800 mg by mouth 3 (three) times daily.   hydrOXYzine 25 MG tablet Commonly known as:  ATARAX/VISTARIL Take 25 mg by mouth 3 (three) times daily as needed. for anxiety   ibuprofen 800 MG tablet Commonly known as:  ADVIL,MOTRIN Take 800 mg by mouth every 8 (eight) hours as needed for moderate pain.   insulin aspart protamine- aspart (70-30) 100 UNIT/ML injection Commonly known as:  NOVOLOG MIX 70/30 Inject 6 Units into the skin 2 (two) times daily with a meal.   insulin glargine 100 unit/mL Sopn Commonly known as:  LANTUS Inject 60 Units into the skin 2 (two) times daily.   levothyroxine 50 MCG  tablet Commonly known as:  SYNTHROID, LEVOTHROID Take 50 mcg by mouth daily.   losartan 50 MG tablet Commonly known as:  COZAAR Take 1 tablet (50 mg total) by mouth daily. Start taking on:  08/19/2017   ondansetron 4 MG tablet Commonly known as:  ZOFRAN Take 4 mg by mouth daily as needed for nausea or vomiting.   tenofovir 300 MG tablet Commonly known as:  VIREAD Take 300 mg by mouth daily.   tiZANidine 4 MG tablet Commonly known as:  ZANAFLEX Take 4 mg by mouth 3 (three) times daily as needed for muscle spasms.   triamcinolone cream 0.1 % Commonly known as:  KENALOG Apply 1 application topically 3 (three) times daily.   valACYclovir 1000 MG tablet Commonly known as:  VALTREX Take 1 g by mouth daily as needed. For breakouts   venlafaxine XR 37.5 MG 24 hr capsule Commonly known as:  EFFEXOR-XR Take 37.5 mg by mouth daily.   zolpidem 10 MG tablet Commonly known as:  AMBIEN Take 10 mg by mouth at bedtime as needed  for sleep.   ZOVIRAX 5 % Generic drug:  acyclovir cream Apply 1 application topically 3 (three) times daily.      No Known Allergies Follow-up Information    [facility]. Schedule an appointment as soon as possible for a visit in 1 week(s).   Why:  To establish care with a primary care provider Contact information: [address] [address] [phone]           The results of significant diagnostics from this hospitalization (including imaging, microbiology, ancillary and laboratory) are listed below for reference.    Significant Diagnostic Studies: Ct Angio Head W Or Wo Contrast  Result Date: 08/17/2017 CLINICAL DATA:  Retro-orbital headache on the right. Blurred vision. EXAM: CT ANGIOGRAPHY HEAD AND NECK TECHNIQUE: Multidetector CT imaging of the head and neck was performed using the standard protocol during bolus administration of intravenous contrast. Multiplanar CT image  reconstructions and MIPs were obtained to evaluate the vascular anatomy. Carotid stenosis measurements (when applicable) are obtained utilizing NASCET criteria, using the distal internal carotid diameter as the denominator. CONTRAST:  ISOVUE-370 IOPAMIDOL (ISOVUE-370) INJECTION 76% COMPARISON:  11/25/2016 FINDINGS: CT HEAD FINDINGS Brain: Normal appearance of the brain. No evidence of old or acute infarction, mass lesion, hemorrhage, hydrocephalus or extra-axial collection. Vascular: No abnormal vascular finding without contrast. Skull: Normal Sinuses: No evidence of sinusitis. Few retention cyst in the left maxillary sinus. Unerupted molar tooth of the left maxilla. Orbits: No abnormal orbital finding. Both globes appear normal. By history there is been recent procedure performed on the right, but no complication is evident related to that. Review of the MIP images confirms the above findings CTA NECK FINDINGS Aortic arch: Normal Right carotid system: Common carotid artery widely patent to the bifurcation. The carotid bifurcation is normal. Cervical internal carotid artery is normal. Left carotid system: Common carotid artery widely patent to the bifurcation. Carotid bifurcation is normal. Cervical internal carotid artery is normal. Vertebral arteries: Vertebral arteries are not well seen because of motion and patient's size. I believe the vertebral arteries are normal, widely patent at their origin and through the cervical region to the foramen magnum. Posterior circulation is somewhat diminutive secondary to fetal origin of both posterior cerebral arteries. Skeleton: Normal except for lower cervical facet osteoarthritis. Other neck: 3.7 cm mass in the low right neck, unchanged since 2017 and likely related to a thyroid nodule. Upper chest: Normal Review of the MIP images confirms the above findings CTA HEAD FINDINGS Anterior circulation: Both internal carotid arteries are patent through the skull base and  siphon regions. The anterior and middle cerebral vessels appear normal without stenosis or occlusion, aneurysm or vascular malformation. No abnormal vascular finding relating to the right orbit. Posterior circulation: Diminutive posterior circulation because of fetal origin of the posterior cerebral arteries. No evidence of posterior circulation stenosis or occlusion. Venous sinuses: Patent and normal. Anatomic variants: No abnormal variant. Delayed phase: No abnormal enhancement. Review of the MIP images confirms the above findings IMPRESSION: SOME: IMPRESSION: SOME No vascular abnormality to explain retro-orbital headache. Normal appearance of the brain. I do not discern any abnormality of the right orbit, despite the history of a recent surgical procedure. 3.7 cm mass in the low right neck presumed to derive from the thyroid gland. Has this been previously evaluated? I think the patient has had previous thyroidectomy related to goiter and this could be some residual thyroid tissue. It is unchanged since a CT scan  of 2017 and therefore is likely benign. Electronically Signed   By: Paulina FusiMark  Shogry M.D.   On: 08/17/2017 16:52   Ct Angio Neck W And/or Wo Contrast  Result Date: 08/17/2017 CLINICAL DATA:  Retro-orbital headache on the right. Blurred vision. EXAM: CT ANGIOGRAPHY HEAD AND NECK TECHNIQUE: Multidetector CT imaging of the head and neck was performed using the standard protocol during bolus administration of intravenous contrast. Multiplanar CT image reconstructions and MIPs were obtained to evaluate the vascular anatomy. Carotid stenosis measurements (when applicable) are obtained utilizing NASCET criteria, using the distal internal carotid diameter as the denominator. CONTRAST:  100mL ISOVUE-370 IOPAMIDOL (ISOVUE-370) INJECTION 76% COMPARISON:  11/25/2016 FINDINGS: CT HEAD FINDINGS Brain: Normal appearance of the brain. No evidence of old or acute infarction, mass lesion, hemorrhage, hydrocephalus or  extra-axial collection. Vascular: No abnormal vascular finding without contrast. Skull: Normal Sinuses: No evidence of sinusitis. Few retention cyst in the left maxillary sinus. Unerupted molar tooth of the left maxilla. Orbits: No abnormal orbital finding. Both globes appear normal. By history there is been recent procedure performed on the right, but no complication is evident related to that. Review of the MIP images confirms the above findings CTA NECK FINDINGS Aortic arch: Normal Right carotid system: Common carotid artery widely patent to the bifurcation. The carotid bifurcation is normal. Cervical internal carotid artery is normal. Left carotid system: Common carotid artery widely patent to the bifurcation. Carotid bifurcation is normal. Cervical internal carotid artery is normal. Vertebral arteries: Vertebral arteries are not well seen because of motion and patient's size. I believe the vertebral arteries are normal, widely patent at their origin and through the cervical region to the foramen magnum. Posterior circulation is somewhat diminutive secondary to fetal origin of both posterior cerebral arteries. Skeleton: Normal except for lower cervical facet osteoarthritis. Other neck: 3.7 cm mass in the low right neck, unchanged since 2017 and likely related to a thyroid nodule. Upper chest: Normal Review of the MIP images confirms the above findings CTA HEAD FINDINGS Anterior circulation: Both internal carotid arteries are patent through the skull base and siphon regions. The anterior and middle cerebral vessels appear normal without stenosis or occlusion, aneurysm or vascular malformation. No abnormal vascular finding relating to the right orbit. Posterior circulation: Diminutive posterior circulation because of fetal origin of the posterior cerebral arteries. No evidence of posterior circulation stenosis or occlusion. Venous sinuses: Patent and normal. Anatomic variants: No abnormal variant. Delayed phase:  No abnormal enhancement. Review of the MIP images confirms the above findings IMPRESSION: SOME: IMPRESSION: SOME No vascular abnormality to explain retro-orbital headache. Normal appearance of the brain. I do not discern any abnormality of the right orbit, despite the history of a recent surgical procedure. 3.7 cm mass in the low right neck presumed to derive from the thyroid gland. Has this been previously evaluated? I think the patient has had previous thyroidectomy related to goiter and this could be some residual thyroid tissue. It is unchanged since a CT scan of 2017 and therefore is likely benign. Electronically Signed   By: Paulina FusiMark  Shogry M.D.   On: 08/17/2017 16:52    Microbiology: No results found for this or any previous visit (from the past 240 hour(s)).   Labs: Basic Metabolic Panel: Recent Labs  Lab 08/17/17 1508  NA 141  K 3.4*  CL 107  CO2 27  GLUCOSE 153*  BUN 8  CREATININE 0.74  CALCIUM 8.7*   Liver Function Tests: No results for input(s): AST, ALT, ALKPHOS,  BILITOT, PROT, ALBUMIN in the last 168 hours. No results for input(s): LIPASE, AMYLASE in the last 168 hours. No results for input(s): AMMONIA in the last 168 hours. CBC: Recent Labs  Lab 08/17/17 1508 08/18/17 0536  WBC 8.8 10.7*  NEUTROABS 4.9  --   HGB 12.3 11.9*  HCT 36.5 35.4*  MCV 89.5 88.7  PLT 168 174   Cardiac Enzymes: No results for input(s): CKTOTAL, CKMB, CKMBINDEX, TROPONINI in the last 168 hours. BNP: BNP (last 3 results) No results for input(s): BNP in the last 8760 hours.  ProBNP (last 3 results) No results for input(s): PROBNP in the last 8760 hours.  CBG: Recent Labs  Lab 08/17/17 2202 08/18/17 0729 08/18/17 1215  GLUCAP 172* 146* 211*       Signed:  Briant CedarNkeiruka J Augustin Bun, MD [facility] 08/18/2017, 1:17 PM

## 2017-08-18 NOTE — Progress Notes (Signed)
Nutrition Brief Note  Patient identified on the Malnutrition Screening Tool (MST) Report  Wt Readings from Last 15 Encounters:  08/17/17 135.2 kg  08/05/17 (!) 139.4 kg  07/21/17 (!) 142.5 kg  06/06/17 117.9 kg  10/14/16 132.3 kg  09/26/16 127 kg  11/17/15 131.5 kg  07/21/15 135.2 kg  04/27/15 135.2 kg  04/19/15 135.2 kg  03/25/15 131.5 kg  12/24/14 136.1 kg  09/29/14 (!) 141.1 kg  05/25/14 (!) 141.5 kg  05/17/14 (!) 142.9 kg   Patient with PMH significant for COPD, DM, hepatic cirrhosis, and thyroid diease. Presents this admission with severe refractory headaches and hypertensive emergency. Pt denies any loss in appetite PTA. She consumed 100% of her grits, fruit bowl, and eggs this morning with complication. Pt denies any recent wt loss and states her weight typically fluctuates. She does not wish to have supplementation this stay. No skin breakdown recorded.   Body mass index is 46.67 kg/m. Patient meets criteria for obesity based on current BMI.   Current diet order is carb modified, patient is consuming approximately 100% of meals at this time. Labs and medications reviewed.   No nutrition interventions warranted at this time. If nutrition issues arise, please consult RD.   Vanessa Kickarly Ethanael Veith RD, LDN Clinical Nutrition Pager # [phone]

## 2017-08-18 NOTE — Care Management Note (Addendum)
Case Management Note  Patient Details  Name: Sheila Gibson MRN: [id number] Date of Birth: 05/31/1970  Subjective/Objective:                  Refractory rt sided headache and htn Alert and orientated rt visual field is decreased/started on bp meds. Action/Plan: Will follow for cm needs and progress. No discharge needs present. Expected Discharge Date:  (unknown)               Expected Discharge Plan:  Home/Self Care  In-House Referral:     Discharge planning Services  CM Consult  Post Acute Care Choice:    Choice offered to:     DME Arranged:    DME Agency:     HH Arranged:    HH Agency:     Status of Service:  In process, will continue to follow  If discussed at Long Length of Stay Meetings, dates discussed:    Additional Comments:  Golda AcreDavis, Ariannah Arenson Lynn, RN 08/18/2017, 12:39 PM

## 2017-08-18 NOTE — Progress Notes (Signed)
  Echocardiogram 2D Echocardiogram has been performed.  Sheila Gibson 08/18/2017, 11:49 AM

## 2017-08-19 ENCOUNTER — Encounter (INDEPENDENT_AMBULATORY_CARE_PROVIDER_SITE_OTHER): Payer: Self-pay | Admitting: Ophthalmology

## 2017-08-19 ENCOUNTER — Ambulatory Visit (INDEPENDENT_AMBULATORY_CARE_PROVIDER_SITE_OTHER): Payer: Medicaid Other | Admitting: Ophthalmology

## 2017-08-19 VITALS — BP 106/68 | HR 87

## 2017-08-19 DIAGNOSIS — I10 Essential (primary) hypertension: Secondary | ICD-10-CM

## 2017-08-19 DIAGNOSIS — H25813 Combined forms of age-related cataract, bilateral: Secondary | ICD-10-CM

## 2017-08-19 DIAGNOSIS — H3581 Retinal edema: Secondary | ICD-10-CM | POA: Diagnosis not present

## 2017-08-19 DIAGNOSIS — E113413 Type 2 diabetes mellitus with severe nonproliferative diabetic retinopathy with macular edema, bilateral: Secondary | ICD-10-CM

## 2017-08-19 DIAGNOSIS — H35033 Hypertensive retinopathy, bilateral: Secondary | ICD-10-CM

## 2017-08-20 ENCOUNTER — Ambulatory Visit (INDEPENDENT_AMBULATORY_CARE_PROVIDER_SITE_OTHER): Payer: Medicaid Other | Admitting: Obstetrics and Gynecology

## 2017-08-20 ENCOUNTER — Encounter: Payer: Self-pay | Admitting: Obstetrics and Gynecology

## 2017-08-20 DIAGNOSIS — A630 Anogenital (venereal) warts: Secondary | ICD-10-CM | POA: Diagnosis not present

## 2017-08-20 NOTE — Progress Notes (Signed)
Sheila Gibson presents for TCA treatment of genital warts.  PE AF VSS Lungs clear Heart RRR GU wart noted on left labial and crease of thigh and pelvis  Each area was covered with [place] around the warts prior to application of TCA. TCA was applied to each wart. Pt tolerated well.  A/p Genital warts s/p TCA  Pt instructed on care. Reframe from IC for 48 hrs. F/U in 3 weeks for revaluation and treatment as needed.

## 2017-08-21 ENCOUNTER — Encounter (INDEPENDENT_AMBULATORY_CARE_PROVIDER_SITE_OTHER): Payer: Self-pay | Admitting: Ophthalmology

## 2017-08-21 ENCOUNTER — Other Ambulatory Visit: Payer: Medicaid Other

## 2017-08-21 DIAGNOSIS — E113413 Type 2 diabetes mellitus with severe nonproliferative diabetic retinopathy with macular edema, bilateral: Secondary | ICD-10-CM | POA: Diagnosis not present

## 2017-08-21 MED ORDER — BEVACIZUMAB CHEMO INJECTION 1.25MG/0.05ML SYRINGE FOR KALEIDOSCOPE
1.2500 mg | INTRAVITREAL | Status: AC
  Administered 2017-08-21: 1.25 mg via INTRAVITREAL

## 2017-08-27 ENCOUNTER — Emergency Department (HOSPITAL_COMMUNITY)
Admission: EM | Admit: 2017-08-27 | Discharge: 2017-08-27 | Disposition: A | Discharge status: Home / Self Care | Payer: Medicaid Other | Attending: Emergency Medicine | Admitting: Emergency Medicine

## 2017-08-27 ENCOUNTER — Other Ambulatory Visit: Payer: Self-pay

## 2017-08-27 ENCOUNTER — Emergency Department (HOSPITAL_COMMUNITY): Payer: Medicaid Other

## 2017-08-27 ENCOUNTER — Encounter (HOSPITAL_COMMUNITY): Payer: Self-pay

## 2017-08-27 DIAGNOSIS — F329 Major depressive disorder, single episode, unspecified: Secondary | ICD-10-CM | POA: Diagnosis not present

## 2017-08-27 DIAGNOSIS — Z79899 Other long term (current) drug therapy: Secondary | ICD-10-CM | POA: Insufficient documentation

## 2017-08-27 DIAGNOSIS — R1010 Upper abdominal pain, unspecified: Secondary | ICD-10-CM | POA: Diagnosis not present

## 2017-08-27 DIAGNOSIS — Z794 Long term (current) use of insulin: Secondary | ICD-10-CM | POA: Diagnosis not present

## 2017-08-27 DIAGNOSIS — R110 Nausea: Secondary | ICD-10-CM | POA: Diagnosis present

## 2017-08-27 DIAGNOSIS — J449 Chronic obstructive pulmonary disease, unspecified: Secondary | ICD-10-CM | POA: Diagnosis not present

## 2017-08-27 DIAGNOSIS — E119 Type 2 diabetes mellitus without complications: Secondary | ICD-10-CM | POA: Diagnosis not present

## 2017-08-27 DIAGNOSIS — I10 Essential (primary) hypertension: Secondary | ICD-10-CM | POA: Diagnosis not present

## 2017-08-27 LAB — URINALYSIS, ROUTINE W REFLEX MICROSCOPIC
BACTERIA UA: NONE SEEN
Bilirubin Urine: NEGATIVE
Glucose, UA: 50 mg/dL — AB
KETONES UR: NEGATIVE mg/dL
NITRITE: NEGATIVE
PROTEIN: 30 mg/dL — AB
Specific Gravity, Urine: 1.014 (ref 1.005–1.030)
pH: 6 (ref 5.0–8.0)

## 2017-08-27 LAB — COMPREHENSIVE METABOLIC PANEL
ALBUMIN: 2.8 g/dL — AB (ref 3.5–5.0)
ALK PHOS: 274 U/L — AB (ref 38–126)
ALT: 60 U/L — ABNORMAL HIGH (ref 0–44)
ANION GAP: 7 (ref 5–15)
AST: 83 U/L — AB (ref 15–41)
BILIRUBIN TOTAL: 1.8 mg/dL — AB (ref 0.3–1.2)
BUN: 12 mg/dL (ref 6–20)
CO2: 22 mmol/L (ref 22–32)
Calcium: 8.3 mg/dL — ABNORMAL LOW (ref 8.9–10.3)
Chloride: 108 mmol/L (ref 98–111)
Creatinine, Ser: 0.87 mg/dL (ref 0.44–1.00)
GFR calc Af Amer: >60 mL/min (ref >60)
GFR calc non Af Amer: >60 mL/min (ref >60)
Glucose, Bld: 226 mg/dL — ABNORMAL HIGH (ref 70–99)
POTASSIUM: 3.8 mmol/L (ref 3.5–5.1)
SODIUM: 137 mmol/L (ref 135–145)
TOTAL PROTEIN: 9 g/dL — AB (ref 6.5–8.1)

## 2017-08-27 LAB — CBC
HEMATOCRIT: 35.6 % — AB (ref 36.0–46.0)
HEMOGLOBIN: 11.9 g/dL — AB (ref 12.0–15.0)
MCH: 30.6 pg (ref 26.0–34.0)
MCHC: 33.4 g/dL (ref 30.0–36.0)
MCV: 91.5 fL (ref 78.0–100.0)
Platelets: 198 10*3/uL (ref 150–400)
RBC: 3.89 MIL/uL (ref 3.87–5.11)
RDW: 14.9 % (ref 11.5–15.5)
WBC: 9.7 10*3/uL (ref 4.0–10.5)

## 2017-08-27 LAB — DIFFERENTIAL
BASOS PCT: 0 %
Basophils Absolute: 0 10*3/uL (ref 0.0–0.1)
EOS ABS: 0.2 10*3/uL (ref 0.0–0.7)
EOS PCT: 2 %
LYMPHS ABS: 2.8 10*3/uL (ref 0.7–4.0)
Lymphocytes Relative: 31 %
MONOS PCT: 7 %
Monocytes Absolute: 0.6 10*3/uL (ref 0.1–1.0)
Neutro Abs: 5.4 10*3/uL (ref 1.7–7.7)
Neutrophils Relative %: 60 %

## 2017-08-27 LAB — I-STAT BETA HCG BLOOD, ED (MC, WL, AP ONLY)

## 2017-08-27 LAB — LIPASE, BLOOD: Lipase: 33 U/L (ref 11–51)

## 2017-08-27 MED ORDER — IOPAMIDOL (ISOVUE-300) INJECTION 61%
100.0000 mL | Freq: Once | INTRAVENOUS | Status: AC | PRN
  Administered 2017-08-27: 100 mL via INTRAVENOUS

## 2017-08-27 MED ORDER — CODEINE SULFATE 30 MG PO TABS: 30.0000 mg | ORAL_TABLET | Freq: Four times a day (QID) | ORAL | 0 refills | Status: DC | PRN

## 2017-08-27 MED ORDER — HYDROMORPHONE HCL 1 MG/ML IJ SOLN
0.5000 mg | Freq: Once | INTRAMUSCULAR | Status: AC
  Administered 2017-08-27: 0.5 mg via INTRAVENOUS
  Filled 2017-08-27: qty 1

## 2017-08-27 MED ORDER — SODIUM CHLORIDE 0.9 % IV BOLUS
1000.0000 mL | Freq: Once | INTRAVENOUS | Status: AC
  Administered 2017-08-27: 1000 mL via INTRAVENOUS

## 2017-08-27 MED ORDER — ONDANSETRON HCL 4 MG/2ML IJ SOLN
4.0000 mg | Freq: Once | INTRAMUSCULAR | Status: AC
  Administered 2017-08-27: 4 mg via INTRAVENOUS
  Filled 2017-08-27: qty 2

## 2017-08-27 MED ORDER — IOPAMIDOL (ISOVUE-300) INJECTION 61%
INTRAVENOUS | Status: AC
  Filled 2017-08-27: qty 100

## 2017-08-27 MED ORDER — ONDANSETRON 4 MG PO TBDP: ORAL_TABLET | ORAL | 0 refills | Status: DC

## 2017-08-27 NOTE — ED Notes (Signed)
Patient called for blood draw, no answer. 

## 2017-08-27 NOTE — ED Provider Notes (Signed)
[facility]-EMERGENCY DEPT Provider Note   CSN: [id number] Arrival date & time: 08/27/17  1109     History   Chief Complaint Chief Complaint  Patient presents with  . Flank Pain  . Nausea  . Diarrhea    HPI Sheila Gibson is a 47 y.o. female.  Patient complains of nausea diarrhea and abdominal pain.  The history is provided by the patient. No language interpreter was used.  Diarrhea   This is a new problem. The current episode started 2 days ago. The problem occurs 2 to 4 times per day. The stool consistency is described as watery. There has been no fever. Pertinent negatives include no abdominal pain, no chills, no headaches and no cough. She has tried nothing for the symptoms. Risk factors include ill contacts. Her past medical history does not include bowel resection.    Past Medical History:  Diagnosis Date  . COPD (chronic obstructive pulmonary disease) (HCC)   . Depression   . Diabetes mellitus without complication (HCC)   . Heart murmur   . Hepatic cirrhosis (HCC)   . Hypertension   . Obesity   . Thyroid disease     Patient Active Problem List   Diagnosis Date Noted  . Warts, genital 08/20/2017  . Hypertensive urgency 08/17/2017    Past Surgical History:  Procedure Laterality Date  . CESAREAN SECTION    . THYROIDECTOMY       OB History    Gravida  6   Para  5   Term  5   Preterm      AB  1   Living  4     SAB  1   TAB      Ectopic      Multiple      Live Births  4            Home Medications    Prior to Admission medications   Medication Sig Start Date End Date Taking? Authorizing Provider  amLODipine (NORVASC) 10 MG tablet Take 1 tablet (10 mg total) by mouth daily. 08/19/17 09/18/17 Yes Briant Cedar, MD  gabapentin (NEURONTIN) 800 MG tablet Take 800 mg by mouth 3 (three) times daily.    Yes [provider]  insulin aspart protamine- aspart (NOVOLOG MIX 70/30) (70-30) 100 UNIT/ML  injection Inject 6 Units into the skin 2 (two) times daily with a meal.   Yes [provider]  insulin glargine (LANTUS) 100 unit/mL SOPN Inject 60 Units into the skin 2 (two) times daily.    Yes [provider]  levothyroxine (SYNTHROID, LEVOTHROID) 50 MCG tablet Take 50 mcg by mouth daily.  07/20/17  Yes [provider]  losartan (COZAAR) 50 MG tablet Take 1 tablet (50 mg total) by mouth daily. 08/19/17 09/18/17 Yes Briant Cedar, MD  tenofovir (VIREAD) 300 MG tablet Take 300 mg by mouth daily. 08/03/17  Yes [provider]  venlafaxine XR (EFFEXOR-XR) 37.5 MG 24 hr capsule Take 37.5 mg by mouth daily. 07/06/17  Yes [provider]  acetaminophen (TYLENOL) 500 MG tablet Take 500 mg by mouth daily as needed for moderate pain.    [provider]  albuterol (PROVENTIL HFA;VENTOLIN HFA) 108 (90 BASE) MCG/ACT inhaler Inhale 1-2 puffs into the lungs every 6 (six) hours as needed for wheezing or shortness of breath.    [provider]  codeine 30 MG tablet Take 1 tablet (30 mg total) by mouth every 6 (six) hours  as needed for severe pain. 08/27/17   Bethann Berkshire, MD  hydrOXYzine (ATARAX/VISTARIL) 25 MG tablet Take 25 mg by mouth 3 (three) times daily as needed. for anxiety 08/05/17   [provider]  ibuprofen (ADVIL,MOTRIN) 800 MG tablet Take 800 mg by mouth every 8 (eight) hours as needed for moderate pain.    [provider]  ondansetron (ZOFRAN ODT) 4 MG disintegrating tablet 4mg  ODT q4 hours prn nausea/vomit 08/27/17   Bethann Berkshire, MD  ondansetron (ZOFRAN) 4 MG tablet Take 4 mg by mouth daily as needed for nausea or vomiting.    [provider]  tiZANidine (ZANAFLEX) 4 MG tablet Take 4 mg by mouth 3 (three) times daily as needed for muscle spasms.     [provider]  triamcinolone cream (KENALOG) 0.1 % Apply 1 application topically 3 (three) times daily. 08/05/17   [provider]    valACYclovir (VALTREX) 1000 MG tablet Take 1 g by mouth daily as needed. For breakouts 08/05/17   [provider]  zolpidem (AMBIEN) 10 MG tablet Take 10 mg by mouth at bedtime as needed for sleep.  01/23/17   [provider]  ZOVIRAX 5 % Apply 1 application topically 3 (three) times daily.  04/14/17   [provider]    Family History Family History  Problem Relation Age of Onset  . Cancer Mother   . Hypertension Other     Social History Social History   Tobacco Use  . Smoking status: Never Smoker  . Smokeless tobacco: Never Used  Substance Use Topics  . Alcohol use: Not Currently  . Drug use: No     Allergies   Patient has no known allergies.   Review of Systems Review of Systems  Constitutional: Negative for appetite change, chills and fatigue.  HENT: Negative for congestion, ear discharge and sinus pressure.   Eyes: Negative for discharge.  Respiratory: Negative for cough.   Cardiovascular: Negative for chest pain.  Gastrointestinal: Positive for diarrhea and nausea. Negative for abdominal pain.  Genitourinary: Negative for frequency and hematuria.  Musculoskeletal: Negative for back pain.  Skin: Negative for rash.  Neurological: Negative for seizures and headaches.  Psychiatric/Behavioral: Negative for hallucinations.     Physical Exam Updated Vital Signs BP (!) 195/120   Pulse 87   Temp 98.5 F (36.9 C) (Oral)   Resp 18   Ht 5\' 7"  (1.702 m)   Wt (!) 138.3 kg   LMP 07/28/2017 (Approximate)   SpO2 100%   BMI 47.77 kg/m   Physical Exam  Constitutional: She is oriented to person, place, and time. She appears well-developed.  HENT:  Head: Normocephalic.  Eyes: Conjunctivae and EOM are normal. No scleral icterus.  Neck: Neck supple. No thyromegaly present.  Cardiovascular: Normal rate and regular rhythm. Exam reveals no gallop and no friction rub.  No murmur heard. Pulmonary/Chest: No stridor. She has no wheezes. She has no  rales. She exhibits no tenderness.  Abdominal: She exhibits no distension. There is tenderness. There is no rebound.  Mild tenderness throughout abdomen  Musculoskeletal: Normal range of motion. She exhibits no edema.  Lymphadenopathy:    She has no cervical adenopathy.  Neurological: She is oriented to person, place, and time. She exhibits normal muscle tone. Coordination normal.  Skin: No rash noted. No erythema.  Psychiatric: She has a normal mood and affect. Her behavior is normal.     ED Treatments / Results  Labs (all labs ordered are listed, but only  abnormal results are displayed) Labs Reviewed  COMPREHENSIVE METABOLIC PANEL - Abnormal; Notable for the following components:      Result Value   Glucose, Bld 226 (*)    Calcium 8.3 (*)    Total Protein 9.0 (*)    Albumin 2.8 (*)    AST 83 (*)    ALT 60 (*)    Alkaline Phosphatase 274 (*)    Total Bilirubin 1.8 (*)    All other components within normal limits  CBC - Abnormal; Notable for the following components:   Hemoglobin 11.9 (*)    HCT 35.6 (*)    All other components within normal limits  URINALYSIS, ROUTINE W REFLEX MICROSCOPIC - Abnormal; Notable for the following components:   Color, Urine AMBER (*)    Glucose, UA 50 (*)    Hgb urine dipstick MODERATE (*)    Protein, ur 30 (*)    Leukocytes, UA TRACE (*)    All other components within normal limits  LIPASE, BLOOD  DIFFERENTIAL  I-STAT BETA HCG BLOOD, ED ([facility], [facility], [facility] ONLY)    EKG None  Radiology Ct Abdomen Pelvis W Contrast  Result Date: 08/27/2017 CLINICAL DATA:  LEFT flank pain, diarrhea, nausea, and RIGHT-side pain from shoulder to thigh for 5 days, history cirrhosis, COPD, diabetes mellitus, hypertension EXAM: CT ABDOMEN AND PELVIS WITH CONTRAST TECHNIQUE: Multidetector CT imaging of the abdomen and pelvis was performed using the standard protocol following bolus administration of intravenous contrast. Sagittal and coronal MPR images reconstructed from  axial data set. CONTRAST:  100mL ISOVUE-300 IOPAMIDOL (ISOVUE-300) INJECTION 61% IV. No oral contrast administered. COMPARISON:  04/28/2016 FINDINGS: Lower chest: Lung bases clear Hepatobiliary: Cholelithiasis. Cirrhotic appearing liver with nodular margins and relative enlargement of the LEFT lobe. No definite hepatic mass lesion. No biliary dilatation. Pancreas: Normal appearance Spleen: Normal size and appearance Adrenals/Urinary Tract: Adrenal glands, kidneys, ureters, and bladder normal appearance Stomach/Bowel: Normal appendix. Stomach and bowel loops normal appearance Vascular/Lymphatic: Aorta normal caliber. Few scattered normal size lymph nodes without adenopathy. Collateral vessels identified in the upper abdomen and perigastric. Reproductive: Unremarkable uterus and ovaries. Other: Small amount of perihepatic ascites. No free air. Tiny umbilical hernia containing fat. Musculoskeletal: Scattered soft tissue edema. No acute osseous findings. IMPRESSION: Cirrhotic appearing liver with collaterals in the upper abdomen spine, small amount of perihepatic ascites, and scattered soft tissue edema. Cholelithiasis. Tiny umbilical hernia containing fat. Otherwise negative exam. Electronically Signed   By: Ulyses SouthwardMark  Boles M.D.   On: 08/27/2017 19:00    Procedures Procedures (including critical care time)  Medications Ordered in ED Medications  iopamidol (ISOVUE-300) 61 % injection (has no administration in time range)  sodium chloride 0.9 % bolus 1,000 mL (1,000 mLs Intravenous New Bag/Given 08/27/17 1716)  ondansetron (ZOFRAN) injection 4 mg (4 mg Intravenous Given 08/27/17 1717)  HYDROmorphone (DILAUDID) injection 0.5 mg (0.5 mg Intravenous Given 08/27/17 1717)  iopamidol (ISOVUE-300) 61 % injection 100 mL (100 mLs Intravenous Contrast Given 08/27/17 1839)     Initial Impression / Assessment and Plan / ED Course  I have reviewed the triage vital signs and the nursing notes.  Pertinent labs & imaging  results that were available during my care of the patient were reviewed by me and considered in my medical decision making (see chart for details).   CT scan shows gallstones with fatty liver.  Labs show elevated glucose.  Patient has an appointment next week with GI to have endoscopy done.  She will be sent home with  nausea and pain medicine    Final Clinical Impressions(s) / ED Diagnoses   Final diagnoses:  Pain of upper abdomen    ED Discharge Orders         Ordered    ondansetron (ZOFRAN ODT) 4 MG disintegrating tablet     08/27/17 1938    codeine 30 MG tablet  Every 6 hours PRN     08/27/17 Chancy Milroy, MD 08/27/17 1942

## 2017-08-27 NOTE — Discharge Instructions (Addendum)
Follow up with your GI doctor as planned

## 2017-08-27 NOTE — ED Triage Notes (Signed)
Patient c/o left flank pain, diarrhea, nausea and right side pain from shoulder to thigh x 5 days.

## 2017-09-01 ENCOUNTER — Encounter (HOSPITAL_COMMUNITY): Payer: Self-pay

## 2017-09-01 ENCOUNTER — Ambulatory Visit (HOSPITAL_COMMUNITY): Payer: Medicaid Other | Admitting: Certified Registered Nurse Anesthetist

## 2017-09-01 ENCOUNTER — Encounter (HOSPITAL_COMMUNITY)
Admission: RE | Disposition: A | Discharge status: Home / Self Care | Payer: Self-pay | Source: Ambulatory Visit | Attending: Gastroenterology

## 2017-09-01 ENCOUNTER — Ambulatory Visit (HOSPITAL_COMMUNITY)
Admission: RE | Admit: 2017-09-01 | Discharge: 2017-09-01 | Disposition: A | Discharge status: Home / Self Care | Payer: Medicaid Other | Source: Ambulatory Visit | Attending: Gastroenterology | Admitting: Gastroenterology

## 2017-09-01 ENCOUNTER — Other Ambulatory Visit: Payer: Self-pay

## 2017-09-01 DIAGNOSIS — Z6841 Body Mass Index (BMI) 40.0 and over, adult: Secondary | ICD-10-CM | POA: Diagnosis not present

## 2017-09-01 DIAGNOSIS — B181 Chronic viral hepatitis B without delta-agent: Secondary | ICD-10-CM | POA: Diagnosis not present

## 2017-09-01 DIAGNOSIS — I8510 Secondary esophageal varices without bleeding: Secondary | ICD-10-CM | POA: Insufficient documentation

## 2017-09-01 DIAGNOSIS — F329 Major depressive disorder, single episode, unspecified: Secondary | ICD-10-CM | POA: Diagnosis not present

## 2017-09-01 DIAGNOSIS — K7460 Unspecified cirrhosis of liver: Secondary | ICD-10-CM | POA: Insufficient documentation

## 2017-09-01 DIAGNOSIS — E119 Type 2 diabetes mellitus without complications: Secondary | ICD-10-CM | POA: Insufficient documentation

## 2017-09-01 DIAGNOSIS — Z794 Long term (current) use of insulin: Secondary | ICD-10-CM | POA: Insufficient documentation

## 2017-09-01 DIAGNOSIS — J449 Chronic obstructive pulmonary disease, unspecified: Secondary | ICD-10-CM | POA: Insufficient documentation

## 2017-09-01 DIAGNOSIS — R011 Cardiac murmur, unspecified: Secondary | ICD-10-CM | POA: Insufficient documentation

## 2017-09-01 DIAGNOSIS — Z79899 Other long term (current) drug therapy: Secondary | ICD-10-CM | POA: Diagnosis not present

## 2017-09-01 DIAGNOSIS — I10 Essential (primary) hypertension: Secondary | ICD-10-CM | POA: Diagnosis not present

## 2017-09-01 HISTORY — PX: ESOPHAGOGASTRODUODENOSCOPY (EGD) WITH PROPOFOL: SHX5813

## 2017-09-01 LAB — GLUCOSE, CAPILLARY
GLUCOSE-CAPILLARY: 320 mg/dL — AB (ref 70–99)
Glucose-Capillary: 296 mg/dL — ABNORMAL HIGH (ref 70–99)

## 2017-09-01 SURGERY — ESOPHAGOGASTRODUODENOSCOPY (EGD) WITH PROPOFOL
Anesthesia: Monitor Anesthesia Care

## 2017-09-01 MED ORDER — SODIUM CHLORIDE 0.9 % IV SOLN: INTRAVENOUS | Status: DC

## 2017-09-01 MED ORDER — PROPOFOL 10 MG/ML IV BOLUS
INTRAVENOUS | Status: AC
  Filled 2017-09-01: qty 40

## 2017-09-01 MED ORDER — LACTATED RINGERS IV SOLN
INTRAVENOUS | Status: DC
  Administered 2017-09-01: 1000 mL via INTRAVENOUS

## 2017-09-01 MED ORDER — LIDOCAINE 2% (20 MG/ML) 5 ML SYRINGE
INTRAMUSCULAR | Status: DC | PRN
  Administered 2017-09-01: 60 mg via INTRAVENOUS

## 2017-09-01 MED ORDER — PROPOFOL 500 MG/50ML IV EMUL
INTRAVENOUS | Status: DC | PRN
  Administered 2017-09-01: 125 ug/kg/min via INTRAVENOUS

## 2017-09-01 MED ORDER — ONDANSETRON HCL 4 MG/2ML IJ SOLN
INTRAMUSCULAR | Status: DC | PRN
  Administered 2017-09-01: 4 mg via INTRAVENOUS

## 2017-09-01 SURGICAL SUPPLY — 14 items

## 2017-09-01 NOTE — Discharge Instructions (Signed)

## 2017-09-01 NOTE — Op Note (Signed)
[facility] Patient Name: Sheila StanfordValencia Rollinson Procedure Date: 8/27/201[facility]9 MRN: [id number] Attending MD: Kathi DerParag Amoree Newlon , MD Date of Birth: 09/26/1970 CSN: [id number] Age: 47 Admit Type: Outpatient Procedure:                Upper GI endoscopy Indications:              Cirrhosis rule out esophageal varices Providers:                Kathi DerParag Grayling Schranz, MD, Janae SauceKaren D. Steele BergHinson, RN, Zoila ShutterGary                            Bryant, Technician, Maricela CuretLaura Peters, CRNA Referring MD:              Medicines:                Sedation Administered by an Anesthesia Professional Complications:            No immediate complications. Estimated Blood Loss:     Estimated blood loss: none. Procedure:                Pre-Anesthesia Assessment:                           - Prior to the procedure, a History and Physical                            was performed, and patient medications and                            allergies were reviewed. The patient's tolerance of                            previous anesthesia was also reviewed. The risks                            and benefits of the procedure and the sedation                            options and risks were discussed with the patient.                            All questions were answered, and informed consent                            was obtained. Prior Anticoagulants: The patient has                            taken no previous anticoagulant or antiplatelet                            agents. ASA Grade Assessment: III - A patient with                            severe systemic disease. After reviewing the risks  and benefits, the patient was deemed in                            satisfactory condition to undergo the procedure.                           After obtaining informed consent, the endoscope was                            passed under direct vision. Throughout the                            procedure, the patient's blood  pressure, pulse, and                            oxygen saturations were monitored continuously. The                            GIF-H190 ([id number]) Olympus adult endoscope was                            introduced through the mouth, and advanced to the                            second part of duodenum. The upper GI endoscopy was                            technically difficult and complex due to presence                            of food. The patient tolerated the procedure well. Scope In: Scope Out: Findings:      Non-bleeding grade I, small (< 5 mm) varices were found in the distal       esophagus,. No stigmata of recent bleeding were evident and no red wale       signs were present.      The Z-line was regular and was found 40 cm from the incisors.      A large amount of food (residue) was found in the entire examined       stomach.      The cardia and gastric fundus were normal on retroflexion.      The duodenal bulb, first portion of the duodenum and second portion of       the duodenum were normal. Impression:               - Non-bleeding grade I and small (< 5 mm)                            esophageal varices.                           - Z-line regular, 40 cm from the incisors.                           - A large amount of food (residue) in  the stomach.                           - Normal duodenal bulb, first portion of the                            duodenum and second portion of the duodenum.                           - No specimens collected. Moderate Sedation:      Moderate (conscious) sedation was personally administered by an       anesthesia professional. The following parameters were monitored: oxygen       saturation, heart rate, blood pressure, and response to care. Recommendation:           - Patient has a contact number available for                            emergencies. The signs and symptoms of potential                            delayed complications were  discussed with the                            patient. Return to normal activities tomorrow.                            Written discharge instructions were provided to the                            patient.                           - Resume previous diet.                           - Continue present medications.                           - Repeat upper endoscopy in 1 year for surveillance.                           - Return to my office as previously scheduled. Procedure Code(s):        --- Professional ---                           [phone], Esophagogastroduodenoscopy, flexible,                            transoral; diagnostic, including collection of                            specimen(s) by brushing or washing, when performed                            (separate procedure) Diagnosis Code(s):        ---  Professional ---                           K74.60, Unspecified cirrhosis of liver                           I85.10, Secondary esophageal varices without                            bleeding CPT copyright 2017 [employer] Association. All rights reserved. The codes documented in this report are preliminary and upon coder review may  be revised to meet current compliance requirements. Kathi Der, MD Kathi Der, MD 09/01/2017 10:20:31 AM Number of Addenda: 0

## 2017-09-01 NOTE — Anesthesia Postprocedure Evaluation (Signed)
Anesthesia Post Note  Patient: Sheila Gibson  Procedure(s) Performed: ESOPHAGOGASTRODUODENOSCOPY (EGD) WITH PROPOFOL (N/A ) ESOPHAGEAL BANDING (N/A )     Patient location during evaluation: PACU Anesthesia Type: MAC Level of consciousness: awake and alert Pain management: pain level controlled Vital Signs Assessment: post-procedure vital signs reviewed and stable Respiratory status: spontaneous breathing, nonlabored ventilation, respiratory function stable and patient connected to nasal cannula oxygen Cardiovascular status: stable and blood pressure returned to baseline Postop Assessment: no apparent nausea or vomiting Anesthetic complications: no    Last Vitals:  Vitals:   09/01/17 1045 09/01/17 1050  BP: 138/66   Pulse: 77 78  Resp: (!) 23 (!) 24  Temp:    SpO2: 97% 97%    Last Pain:  Vitals:   09/01/17 1030  TempSrc:   PainSc: 0-No pain                 Ipek Westra S

## 2017-09-01 NOTE — Transfer of Care (Signed)
Immediate Anesthesia Transfer of Care Note  Patient: Sheila Gibson  Procedure(s) Performed: ESOPHAGOGASTRODUODENOSCOPY (EGD) WITH PROPOFOL (N/A ) ESOPHAGEAL BANDING (N/A )  Patient Location: PACU  Anesthesia Type:MAC  Level of Consciousness: awake, alert  and oriented  Airway & Oxygen Therapy: Patient Spontanous Breathing and Patient connected to nasal cannula oxygen  Post-op Assessment: Report given to RN and Post -op Vital signs reviewed and stable  Post vital signs: Reviewed and stable  Last Vitals:  Vitals Value Taken Time  BP 146/75 09/01/2017 10:19 AM  Temp 36.8 C 09/01/2017 10:19 AM  Pulse 77 09/01/2017 10:20 AM  Resp 26 09/01/2017 10:20 AM  SpO2 100 % 09/01/2017 10:20 AM  Vitals shown include unvalidated device data.  Last Pain:  Vitals:   09/01/17 1019  TempSrc: Oral  PainSc: 0-No pain         Complications: No apparent anesthesia complications

## 2017-09-01 NOTE — H&P (Signed)
Primary Care Physician:  Patient, No Pcp Per Primary Gastroenterologist:  Dr. Levora AngelBrahmbhatt  Reason for Visit : EGD  HPI: Sheila Gibson is a 47 y.o. female presented today for EGD to screen for esophageal varices. Past medical history significant for cirrhosis from chronic hepatitis B. Currently on tenofovir. She is denying any acute GI symptoms today.cT abdomen pelvis with IV contrast 08/27/2017 showed cirrhosis , negative for HCC.normal platelet counts.mild elevated LFTs.  Past Medical History:  Diagnosis Date  . COPD (chronic obstructive pulmonary disease) (HCC)   . Depression   . Diabetes mellitus without complication (HCC)   . Heart murmur   . Hepatic cirrhosis (HCC)   . Hypertension   . Obesity   . Thyroid disease     Past Surgical History:  Procedure Laterality Date  . CESAREAN SECTION    . THYROIDECTOMY      Prior to Admission medications   Medication Sig Start Date End Date Taking? Authorizing Provider  acetaminophen (TYLENOL) 500 MG tablet Take 500 mg by mouth daily as needed for moderate pain.   Yes [provider]  albuterol (PROVENTIL HFA;VENTOLIN HFA) 108 (90 BASE) MCG/ACT inhaler Inhale 1-2 puffs into the lungs every 6 (six) hours as needed for wheezing or shortness of breath.   Yes [provider]  amLODipine (NORVASC) 10 MG tablet Take 1 tablet (10 mg total) by mouth daily. 08/19/17 09/18/17 Yes Briant CedarEzenduka, Nkeiruka J, MD  codeine 30 MG tablet Take 1 tablet (30 mg total) by mouth every 6 (six) hours as needed for severe pain. 08/27/17  Yes Bethann BerkshireZammit, Joseph, MD  gabapentin (NEURONTIN) 800 MG tablet Take 800 mg by mouth 3 (three) times daily.    Yes [provider]  hydrOXYzine (ATARAX/VISTARIL) 25 MG tablet Take 25 mg by mouth 3 (three) times daily as needed. for anxiety 08/05/17  Yes [provider]  ibuprofen (ADVIL,MOTRIN) 800 MG tablet Take 800 mg by mouth every 8 (eight) hours as needed for moderate pain.   Yes [provider]  insulin aspart protamine- aspart (NOVOLOG MIX 70/30) (70-30) 100 UNIT/ML injection Inject 6 Units into the skin 2 (two) times daily with a meal.   Yes [provider]  insulin glargine (LANTUS) 100 unit/mL SOPN Inject 60 Units into the skin 2 (two) times daily.    Yes [provider]  levothyroxine (SYNTHROID, LEVOTHROID) 50 MCG tablet Take 50 mcg by mouth daily.  07/20/17  Yes [provider]  losartan (COZAAR) 50 MG tablet Take 1 tablet (50 mg total) by mouth daily. 08/19/17 09/18/17 Yes Briant CedarEzenduka, Nkeiruka J, MD  ondansetron (ZOFRAN ODT) 4 MG disintegrating tablet 4mg  ODT q4 hours prn nausea/vomit 08/27/17  Yes Bethann BerkshireZammit, Joseph, MD  valACYclovir (VALTREX) 1000 MG tablet Take 1 g by mouth daily as needed. For breakouts 08/05/17  Yes [provider]  venlafaxine XR (EFFEXOR-XR) 37.5 MG 24 hr capsule Take 37.5 mg by mouth daily. 07/06/17  Yes [provider]  zolpidem (AMBIEN) 10 MG tablet Take 10 mg by mouth at bedtime as needed for sleep.  01/23/17  Yes [provider]  ondansetron (ZOFRAN) 4 MG tablet Take 4 mg by mouth daily as needed for nausea or vomiting.    [provider]  tenofovir (VIREAD) 300 MG tablet Take 300 mg by mouth daily. 08/03/17   [provider]  tiZANidine (ZANAFLEX) 4 MG tablet Take 4 mg by mouth 3 (three) times daily as needed for muscle spasms.     [provider]  triamcinolone cream (KENALOG) 0.1 % Apply 1 application topically 3 (three) times daily. 08/05/17   [provider]  ZOVIRAX 5 % Apply 1 application topically 3 (three) times daily.  04/14/17   [provider]    Scheduled Meds: Continuous Infusions: . sodium chloride    . sodium chloride    . lactated ringers 1,000 mL (09/01/17 0953)   PRN Meds:.  Allergies as of 07/06/2017  . (No Known Allergies)    Family History  Problem Relation Age of Onset  . Cancer Mother   . Hypertension Other     Social  History   Socioeconomic History  . Marital status: Single    Spouse name: Not on file  . Number of children: Not on file  . Years of education: Not on file  . Highest education level: Not on file  Occupational History  . Not on file  Social Needs  . Financial resource strain: Not on file  . Food insecurity:    Worry: Not on file    Inability: Not on file  . Transportation needs:    Medical: Not on file    Non-medical: Not on file  Tobacco Use  . Smoking status: Never Smoker  . Smokeless tobacco: Never Used  Substance and Sexual Activity  . Alcohol use: Not Currently  . Drug use: No  . Sexual activity: Not Currently    Birth control/protection: None  Lifestyle  . Physical activity:    Days per week: Not on file    Minutes per session: Not on file  . Stress: Not on file  Relationships  . Social connections:    Talks on phone: Not on file    Gets together: Not on file    Attends religious service: Not on file    Active member of club or organization: Not on file    Attends meetings of clubs or organizations: Not on file    Relationship status: Not on file  . Intimate partner violence:    Fear of current or ex partner: Not on file    Emotionally abused: Not on file    Physically abused: Not on file    Forced sexual activity: Not on file  Other Topics Concern  . Not on file  Social History Narrative  . Not on file    Review of Systems: All negative except as stated above in HPI.  Physical Exam: Vital signs: Vitals:   09/01/17 0945  BP: 135/82  Pulse: 84  Resp: (!) 24  Temp: 97.9 F (36.6 C)  SpO2: 96%     General:  A/O X 3, AND Lungs:  Clear throughout to auscultation.   No wheezes, crackles, or rhonchi. No acute distress. Heart:  Regular rate and rhythm; no murmurs, clicks, rubs,  or gallops. Abdomen: soft, nontender, nondistended, bowel sounds present. Rectal:  Deferred  GI:  Lab Results: No results for input(s): WBC, HGB, HCT, PLT in the last 72  hours. BMET No results for input(s): NA, K, CL, CO2, GLUCOSE, BUN, CREATININE, CALCIUM in the last 72 hours. LFT No results for input(s): PROT, ALBUMIN, AST, ALT, ALKPHOS, BILITOT, BILIDIR, IBILI in the last 72 hours. PT/INR No results for input(s): LABPROT, INR in the last 72 hours.   Studies/Results: No results found.  Impression/Plan: - cirrhosis of the liver - chronic hepatitis B  Recommendations ---------------------- - EGD to screen for varices and possible band ligation today.  Risks (bleeding, infection, bowel perforation that could require surgery, sedation-related changes  in cardiopulmonary systems), benefits (identification and possible treatment of source of symptoms, exclusion of certain causes of symptoms), and alternatives (watchful waiting, radiographic imaging studies, empiric medical treatment)  were explained to patient in detail and patient wishes to proceed.    LOS: 0 days   Kathi Der  MD, FACP 09/01/2017, 9:58 AM  Contact #  [phone]

## 2017-09-01 NOTE — Anesthesia Preprocedure Evaluation (Signed)
Anesthesia Evaluation  Patient identified by MRN, date of birth, ID band Patient awake    Reviewed: Allergy & Precautions, NPO status , Patient's Chart, lab work & pertinent test results  Airway Mallampati: II  TM Distance: >3 FB Neck ROM: Full    Dental no notable dental hx.    Pulmonary COPD,    breath sounds clear to auscultation + decreased breath sounds      Cardiovascular hypertension, Normal cardiovascular exam Rhythm:Regular Rate:Normal  Left ventricle: The cavity size was normal. There was mild   concentric hypertrophy. Systolic function was vigorous. The   estimated ejection fraction was in the range of 65% to 70%. There   was dynamic obstruction at restin the mid cavity. Wall motion was   normal; there were no regional wall motion abnormalities. There   was an increased relative contribution of atrial contraction to   ventricular filling. Doppler parameters are consistent with   abnormal left ventricular relaxation (grade 1 diastolic   dysfunction). Doppler parameters are consistent with high   ventricular filling pressure. - Aortic valve: Trileaflet; normal thickness, mildly calcified   leaflets. - Mitral valve: Calcified annulus. - Pulmonic valve: The findings are consistent with mild stenosis.   Peak gradient (S): 19 mm Hg. - Pulmonary arteries: Systolic pressure could not be accurately   estimated.   Neuro/Psych negative neurological ROS  negative psych ROS   GI/Hepatic negative GI ROS, (+) Cirrhosis       ,   Endo/Other  diabetes, Insulin DependentMorbid obesity  Renal/GU negative Renal ROS  negative genitourinary   Musculoskeletal negative musculoskeletal ROS (+)   Abdominal   Peds negative pediatric ROS (+)  Hematology negative hematology ROS (+)   Anesthesia Other Findings   Reproductive/Obstetrics negative OB ROS                             Anesthesia  Physical Anesthesia Plan  ASA: III  Anesthesia Plan: MAC   Post-op Pain Management:    Induction: Intravenous  PONV Risk Score and Plan:   Airway Management Planned: Simple Face Mask  Additional Equipment:   Intra-op Plan:   Post-operative Plan:   Informed Consent: I have reviewed the patients History and Physical, chart, labs and discussed the procedure including the risks, benefits and alternatives for the proposed anesthesia with the patient or authorized representative who has indicated his/her understanding and acceptance.   Dental advisory given  Plan Discussed with: CRNA and Surgeon  Anesthesia Plan Comments:         Anesthesia Quick Evaluation

## 2017-09-02 ENCOUNTER — Encounter (HOSPITAL_COMMUNITY): Payer: Self-pay | Admitting: Gastroenterology

## 2017-09-10 ENCOUNTER — Emergency Department (HOSPITAL_COMMUNITY): Payer: Medicaid Other

## 2017-09-10 ENCOUNTER — Emergency Department (HOSPITAL_COMMUNITY)
Admission: EM | Admit: 2017-09-10 | Discharge: 2017-09-11 | Disposition: A | Discharge status: Home / Self Care | Payer: Medicaid Other | Attending: Emergency Medicine | Admitting: Emergency Medicine

## 2017-09-10 ENCOUNTER — Other Ambulatory Visit (HOSPITAL_COMMUNITY)
Admission: RE | Admit: 2017-09-10 | Discharge: 2017-09-10 | Disposition: A | Discharge status: Home / Self Care | Payer: Medicaid Other | Source: Ambulatory Visit | Attending: Obstetrics and Gynecology | Admitting: Obstetrics and Gynecology

## 2017-09-10 ENCOUNTER — Ambulatory Visit: Payer: Medicaid Other | Admitting: Obstetrics and Gynecology

## 2017-09-10 ENCOUNTER — Other Ambulatory Visit: Payer: Self-pay

## 2017-09-10 ENCOUNTER — Encounter: Payer: Self-pay | Admitting: Obstetrics and Gynecology

## 2017-09-10 VITALS — BP 180/115 | HR 91 | Wt 305.2 lb

## 2017-09-10 DIAGNOSIS — A630 Anogenital (venereal) warts: Secondary | ICD-10-CM | POA: Diagnosis not present

## 2017-09-10 DIAGNOSIS — J449 Chronic obstructive pulmonary disease, unspecified: Secondary | ICD-10-CM | POA: Insufficient documentation

## 2017-09-10 DIAGNOSIS — N898 Other specified noninflammatory disorders of vagina: Secondary | ICD-10-CM | POA: Diagnosis not present

## 2017-09-10 DIAGNOSIS — I10 Essential (primary) hypertension: Secondary | ICD-10-CM | POA: Insufficient documentation

## 2017-09-10 DIAGNOSIS — Z794 Long term (current) use of insulin: Secondary | ICD-10-CM | POA: Insufficient documentation

## 2017-09-10 DIAGNOSIS — R1032 Left lower quadrant pain: Secondary | ICD-10-CM | POA: Diagnosis present

## 2017-09-10 DIAGNOSIS — E119 Type 2 diabetes mellitus without complications: Secondary | ICD-10-CM | POA: Insufficient documentation

## 2017-09-10 DIAGNOSIS — Z79899 Other long term (current) drug therapy: Secondary | ICD-10-CM | POA: Diagnosis not present

## 2017-09-10 DIAGNOSIS — R109 Unspecified abdominal pain: Secondary | ICD-10-CM

## 2017-09-10 LAB — COMPREHENSIVE METABOLIC PANEL
ALT: 59 U/L — ABNORMAL HIGH (ref 0–44)
AST: 74 U/L — ABNORMAL HIGH (ref 15–41)
Albumin: 2.4 g/dL — ABNORMAL LOW (ref 3.5–5.0)
Alkaline Phosphatase: 243 U/L — ABNORMAL HIGH (ref 38–126)
Anion gap: 9 (ref 5–15)
BUN: 12 mg/dL (ref 6–20)
CHLORIDE: 108 mmol/L (ref 98–111)
CO2: 22 mmol/L (ref 22–32)
Calcium: 8 mg/dL — ABNORMAL LOW (ref 8.9–10.3)
Creatinine, Ser: 1.02 mg/dL — ABNORMAL HIGH (ref 0.44–1.00)
Glucose, Bld: 276 mg/dL — ABNORMAL HIGH (ref 70–99)
POTASSIUM: 3.7 mmol/L (ref 3.5–5.1)
SODIUM: 139 mmol/L (ref 135–145)
Total Bilirubin: 1.3 mg/dL — ABNORMAL HIGH (ref 0.3–1.2)
Total Protein: 8 g/dL (ref 6.5–8.1)

## 2017-09-10 LAB — URINALYSIS, ROUTINE W REFLEX MICROSCOPIC
Glucose, UA: >=500 mg/dL — AB
HGB URINE DIPSTICK: NEGATIVE
KETONES UR: 5 mg/dL — AB
LEUKOCYTES UA: NEGATIVE
Nitrite: NEGATIVE
PROTEIN: 100 mg/dL — AB
SPECIFIC GRAVITY, URINE: 1.035 — AB (ref 1.005–1.030)
pH: 6 (ref 5.0–8.0)

## 2017-09-10 LAB — CBC
HEMATOCRIT: 32.2 % — AB (ref 36.0–46.0)
HEMOGLOBIN: 10.8 g/dL — AB (ref 12.0–15.0)
MCH: 30 pg (ref 26.0–34.0)
MCHC: 33.5 g/dL (ref 30.0–36.0)
MCV: 89.4 fL (ref 78.0–100.0)
Platelets: 133 10*3/uL — ABNORMAL LOW (ref 150–400)
RBC: 3.6 MIL/uL — AB (ref 3.87–5.11)
RDW: 14.9 % (ref 11.5–15.5)
WBC: 8.1 10*3/uL (ref 4.0–10.5)

## 2017-09-10 LAB — I-STAT BETA HCG BLOOD, ED (MC, WL, AP ONLY): I-stat hCG, quantitative: <5 m[IU]/mL (ref <5)

## 2017-09-10 LAB — LIPASE, BLOOD: LIPASE: 44 U/L (ref 11–51)

## 2017-09-10 MED ORDER — MORPHINE SULFATE (PF) 4 MG/ML IV SOLN
4.0000 mg | Freq: Once | INTRAVENOUS | Status: AC
  Administered 2017-09-10: 4 mg via INTRAVENOUS
  Filled 2017-09-10: qty 1

## 2017-09-10 NOTE — ED Notes (Signed)
Pt intermittently falling asleep while speaking to nurse

## 2017-09-10 NOTE — ED Triage Notes (Signed)
Patient arrives with c/o generalized pain, LUQ worse. +Nausea, - vomiting, Diarrhea yesterday. +SOB, hx COPD, diabetes.   BP: 161/57 HR: 84 RR: 18 O2: 98% CBG: 247

## 2017-09-10 NOTE — ED Provider Notes (Signed)
Sheila Gibson   Sheila Gibson: [phone] Arrival date & time: 09/10/17  1835     History   Chief Complaint Chief Complaint  Patient presents with  . Generalized Body Aches  . Abdominal Pain    HPI [address] Sheila Gibson is a 47 y.o. female.  HPI Patient presents to the emergency room for evaluation of generalized body aches and left-sided abdominal pain.  Patient states she has been having symptoms for the past week or so.  She has been aching all over but then started aching on the left side of her abdomen.  She denies any nausea or vomiting.  She has had some loose stools.  She denies any urinary frequency.  Patient denies any trouble with chest pain or shortness of breath.  Patient states she has multiple medical problems including cirrhosis.  SHe denies any prior abdominal surgeries. Past Medical History:  Diagnosis Date  . COPD (chronic obstructive pulmonary disease) (HCC)   . Depression   . Diabetes mellitus without complication (HCC)   . Heart murmur   . Hepatic cirrhosis (HCC)   . Hypertension   . Obesity   . Thyroid disease     Patient Active Problem List   Diagnosis Date Noted  . Warts, genital 08/20/2017  . Hypertensive urgency 08/17/2017    Past Surgical History:  Procedure Laterality Date  . CESAREAN SECTION    . ESOPHAGOGASTRODUODENOSCOPY (EGD) WITH PROPOFOL N/A 09/01/2017   Procedure: ESOPHAGOGASTRODUODENOSCOPY (EGD) WITH PROPOFOL;  Surgeon: Sheila Der, Sheila Gibson;  Location: [facility];  Service: Gastroenterology;  Laterality: N/A;  . THYROIDECTOMY       OB History    Gravida  6   Para  5   Term  5   Preterm      AB  1   Living  4     SAB  1   TAB      Ectopic      Multiple      Live Births  4            Home Medications    Prior to Admission medications   Medication Sig Start Date End Date Taking? Authorizing Provider  amLODipine (NORVASC) 5 MG tablet Take 5 mg by mouth daily.  08/31/17  Yes Provider, Historical, Sheila Gibson  gabapentin (NEURONTIN) 800 MG tablet Take 800 mg by mouth 3 (three) times daily.    Yes Provider, Historical, Sheila Gibson  insulin aspart protamine- aspart (NOVOLOG MIX 70/30) (70-30) 100 UNIT/ML injection Inject 6 Units into the skin 2 (two) times daily with a meal.   Yes Provider, Historical, Sheila Gibson  insulin glargine (LANTUS) 100 unit/mL SOPN Inject 60 Units into the skin 2 (two) times daily.    Yes Provider, Historical, Sheila Gibson  levothyroxine (SYNTHROID, LEVOTHROID) 50 MCG tablet Take 50 mcg by mouth daily.  07/20/17  Yes Provider, Historical, Sheila Gibson  losartan (COZAAR) 50 MG tablet Take 1 tablet (50 mg total) by mouth daily. 08/19/17 09/18/17 Yes Sheila Cedar, Sheila Gibson  tenofovir (VIREAD) 300 MG tablet Take 300 mg by mouth daily. 08/03/17  Yes Provider, Historical, Sheila Gibson  triamcinolone cream (KENALOG) 0.1 % Apply 1 application topically 3 (three) times daily. 08/05/17  Yes Provider, Historical, Sheila Gibson  venlafaxine XR (EFFEXOR-XR) 37.5 MG 24 hr capsule Take 37.5 mg by mouth daily. 07/06/17  Yes Provider, Historical, Sheila Gibson  ZOVIRAX 5 % Apply 1 application topically 3 (three) times daily.  04/14/17  Yes Provider, Historical, Sheila Gibson  acetaminophen (TYLENOL) 500 MG tablet Take  500 mg by mouth daily as needed for moderate pain.    Provider, Historical, Sheila Gibson  albuterol (PROVENTIL HFA;VENTOLIN HFA) 108 (90 BASE) MCG/ACT inhaler Inhale 1-2 puffs into the lungs every 6 (six) hours as needed for wheezing or shortness of breath.    Provider, Historical, Sheila Gibson  amLODipine (NORVASC) 10 MG tablet Take 1 tablet (10 mg total) by mouth daily. Patient not taking: Reported on 09/10/2017 08/19/17 09/18/17  Sheila Cedar, Sheila Gibson  codeine 30 MG tablet Take 1 tablet (30 mg total) by mouth every 6 (six) hours as needed for severe pain. Patient not taking: Reported on 09/10/2017 08/27/17   Sheila Berkshire, Sheila Gibson  hydrOXYzine (ATARAX/VISTARIL) 25 MG tablet Take 25 mg by mouth 3 (three) times daily as needed. for anxiety 08/05/17   Provider,  Historical, Sheila Gibson  ibuprofen (ADVIL,MOTRIN) 800 MG tablet Take 800 mg by mouth every 8 (eight) hours as needed for moderate pain.    Provider, Historical, Sheila Gibson  ondansetron (ZOFRAN ODT) 4 MG disintegrating tablet 4mg  ODT q4 hours prn nausea/vomit Patient not taking: Reported on 09/10/2017 08/27/17   Sheila Berkshire, Sheila Gibson  ondansetron (ZOFRAN) 4 MG tablet Take 4 mg by mouth daily as needed for nausea or vomiting.    Provider, Historical, Sheila Gibson  tiZANidine (ZANAFLEX) 4 MG tablet Take 4 mg by mouth 3 (three) times daily as needed for muscle spasms.     Provider, Historical, Sheila Gibson  valACYclovir (VALTREX) 1000 MG tablet Take 1 g by mouth daily as needed. For breakouts 08/05/17   Provider, Historical, Sheila Gibson  zolpidem (AMBIEN) 10 MG tablet Take 10 mg by mouth at bedtime as needed for sleep.  01/23/17   Provider, Historical, Sheila Gibson    Family History Family History  Problem Relation Age of Onset  . Cancer Mother   . Hypertension Other     Social History Social History   Tobacco Use  . Smoking status: Never Smoker  . Smokeless tobacco: Never Used  Substance Use Topics  . Alcohol use: Not Currently  . Drug use: No     Allergies   Patient has no known allergies.   Review of Systems Review of Systems  All other systems reviewed and are negative.    Physical Exam Updated Vital Signs BP (!) 143/87   Pulse 81   Temp 99.5 F (37.5 C) (Oral)   Resp 15   Ht 1.702 m (5\' 7" )   Wt (!) 138.3 kg   LMP 08/10/2017 Comment: neg preg test 09/10/17  SpO2 97%   BMI 47.77 kg/m   Physical Exam  Constitutional: No distress.  Morbidly obese  HENT:  Head: Normocephalic and atraumatic.  Right Ear: External ear normal.  Left Ear: External ear normal.  Eyes: Conjunctivae are normal. Right eye exhibits no discharge. Left eye exhibits no discharge. No scleral icterus.  Neck: Neck supple. No tracheal deviation present.  Cardiovascular: Normal rate, regular rhythm and intact distal pulses.  Pulmonary/Chest: Effort normal  and breath sounds normal. No stridor. No respiratory distress. She has no wheezes. She has no rales.  Abdominal: Soft. Bowel sounds are normal. She exhibits no distension. There is tenderness in the left lower quadrant. There is no rigidity, no rebound and no guarding. No hernia.  Musculoskeletal: She exhibits no edema or tenderness.  Neurological: She is alert. She has normal strength. No cranial nerve deficit (no facial droop, extraocular movements intact, no slurred speech) or sensory deficit. She exhibits normal muscle tone. She displays no seizure activity. Coordination normal.  Skin: Skin is  warm and dry. No rash noted.  Psychiatric: She has a normal mood and affect.  Nursing Gibson and vitals reviewed.    ED Treatments / Results  Labs (all labs ordered are listed, but only abnormal results are displayed) Labs Reviewed  COMPREHENSIVE METABOLIC PANEL - Abnormal; Notable for the following components:      Result Value   Glucose, Bld 276 (*)    Creatinine, Ser 1.02 (*)    Calcium 8.0 (*)    Albumin 2.4 (*)    AST 74 (*)    ALT 59 (*)    Alkaline Phosphatase 243 (*)    Total Bilirubin 1.3 (*)    All other components within normal limits  CBC - Abnormal; Notable for the following components:   RBC 3.60 (*)    Hemoglobin 10.8 (*)    HCT 32.2 (*)    Platelets 133 (*)    All other components within normal limits  URINALYSIS, ROUTINE W REFLEX MICROSCOPIC - Abnormal; Notable for the following components:   Color, Urine AMBER (*)    Specific Gravity, Urine 1.035 (*)    Glucose, UA >=500 (*)    Bilirubin Urine SMALL (*)    Ketones, ur 5 (*)    Protein, ur 100 (*)    Bacteria, UA RARE (*)    All other components within normal limits  LIPASE, BLOOD  I-STAT BETA HCG BLOOD, ED ([facility], [facility], [facility] ONLY)    EKG None  Radiology Dg Abdomen Acute W/chest  Result Date: 09/10/2017 CLINICAL DATA:  Acute abdominal pain with nausea. EXAM: DG ABDOMEN ACUTE W/ 1V CHEST COMPARISON:  None. FINDINGS:  The cardiomediastinal silhouette is unremarkable. Mild peribronchial thickening is unchanged. No airspace disease, pleural effusion or pneumothorax. Moderate colonic stool noted without evidence of bowel obstruction or pneumoperitoneum. No suspicious calcifications are present. No acute bony abnormalities are identified. IMPRESSION: No acute abnormality. Moderate colonic stool. No evidence of bowel obstruction or pneumoperitoneum. Electronically Signed   By: Harmon Pier M.D.   On: 09/10/2017 23:01    Procedures Procedures (including critical care time)  Medications Ordered in ED Medications  morphine 4 MG/ML injection 4 mg (4 mg Intravenous Given 09/10/17 2220)     Initial Impression / Assessment and Plan / ED Course  I have reviewed the triage vital signs and the nursing notes.  Pertinent labs & imaging results that were available during my care of the patient were reviewed by me and considered in my medical decision making (see chart for details).  Clinical Course as of Sep 11 2335  Thu Sep 10, 2017  2125 Urinalysis not consistent with urinary tract infection   [JK]  2125 Anemia noted.  Slight decrease from previous values   [JK]  2126 LFTs decreased from previous values  Comprehensive metabolic panel(!) [JK]  2132 His records reviewed.  Patient had a CT scan of her abdomen pelvis on August 22.  No acute abnormalities noted.  Findings consistent with her chronic liver disease   [JK]    Clinical Course User Index [JK] Sheila Dibbles, Sheila Gibson    Patient presented to the emergency room with complaints of abdominal pain.  Patient did have tenderness palpation in the left lower quadrant however her laboratory tests are reassuring.  She had similar mentation just a couple weeks ago and the CT scan did not show any acute abnormalities.  I have low suspicion for diverticulitis, spontaneous bacterial or tinnitus or other acute abdominal infection.  At this time there does not  appear to be any evidence  of an acute emergency medical condition and the patient appears stable for discharge with appropriate outpatient follow up.   Final Clinical Impressions(s) / ED Diagnoses   Final diagnoses:  Abdominal pain, unspecified abdominal location    ED Discharge Orders    None       Sheila Dibbles, Sheila Gibson 09/10/17 2338

## 2017-09-10 NOTE — Progress Notes (Signed)
Patient ID: Sheila Gibson, female   DOB: 1970/10/08, 47 y.o.   MRN: [id number] [name] is here for follow from TCA tx. Is now on BP meds Also thinks she has a yeast infection  PE AF  BP 180/115  Repeat 176/112  GU small wart on left labia, smaller since last visit, TCA applied  Vaginal swab obtained as well  A/P Genital wart        Hypertension Advised to go to [facility] ER for evaluation of her BP. F/U with TCA Tx in 2 weeks

## 2017-09-10 NOTE — Progress Notes (Signed)
Pt is here for FU after TCA tx on genital warts on 08/20/17. Pt states the warts have resolved/gotten smaller.

## 2017-09-10 NOTE — ED Triage Notes (Signed)
Patient reports pain started Friday, states pain is mainly left upper quadrant. States "tips of toes hurt."

## 2017-09-10 NOTE — Discharge Instructions (Addendum)
Continue your current medications, follow-up with your primary care doctor °

## 2017-09-11 LAB — CERVICOVAGINAL ANCILLARY ONLY
BACTERIAL VAGINITIS: POSITIVE — AB
CANDIDA VAGINITIS: NEGATIVE

## 2017-09-15 NOTE — Progress Notes (Signed)
[facility] - Clinic Note  09/16/2017     CHIEF COMPLAINT Patient presents for Retina Follow Up   HISTORY OF PRESENT ILLNESS: Sheila Gibson is a 47 y.o. female who presents to the clinic today for:   HPI    Retina Follow Up    Patient presents with  Other.  In right eye.  This started 2 months ago.  Severity is mild.  Since onset it is stable.  I, the attending physician,  performed the HPI with the patient and updated documentation appropriately.          Comments    F/U NPDR OU Patient states she does not notice a "difference" in her vision ,"I have a lazy eye". Bs 150(09/13/17), Bs have been [facility] per patient, she has not checked it since Sunday, she ran out of strips. Pt is ready for tx today if indicated.       Last edited by Rennis Chris, MD on 09/16/2017  4:31 PM. (History)      Referring physician: No referring provider defined for this encounter.  HISTORICAL INFORMATION:   Selected notes from the MEDICAL RECORD NUMBER Referred by Dr. Aura Camps for concern of PDR and CSME OU LEE: 06.24.19 Kirk Ruths) [BCVA: OD: 20/60-2 OS: 20/25] Ocular Hx-DME OU, PDR OU, cataract OU PMH-DM (taking Lantus and Novolog), Thyroid disease, HTN    CURRENT MEDICATIONS: No current outpatient medications on file. (Ophthalmic Drugs)   No current facility-administered medications for this visit.  (Ophthalmic Drugs)   Current Outpatient Medications (Other)  Medication Sig  . acetaminophen (TYLENOL) 500 MG tablet Take 500 mg by mouth daily as needed for moderate pain.  Marland Kitchen albuterol (PROVENTIL HFA;VENTOLIN HFA) 108 (90 BASE) MCG/ACT inhaler Inhale 1-2 puffs into the lungs every 6 (six) hours as needed for wheezing or shortness of breath.  Marland Kitchen amLODipine (NORVASC) 10 MG tablet Take 1 tablet (10 mg total) by mouth daily.  Marland Kitchen amLODipine (NORVASC) 5 MG tablet Take 5 mg by mouth daily.  . codeine 30 MG tablet Take 1 tablet (30 mg total) by mouth every 6 (six) hours  as needed for severe pain.  Marland Kitchen gabapentin (NEURONTIN) 800 MG tablet Take 800 mg by mouth 3 (three) times daily.   . hydrOXYzine (ATARAX/VISTARIL) 25 MG tablet Take 25 mg by mouth 3 (three) times daily as needed. for anxiety  . ibuprofen (ADVIL,MOTRIN) 800 MG tablet Take 800 mg by mouth every 8 (eight) hours as needed for moderate pain.  Marland Kitchen insulin aspart protamine- aspart (NOVOLOG MIX 70/30) (70-30) 100 UNIT/ML injection Inject 6 Units into the skin 2 (two) times daily with a meal.  . insulin glargine (LANTUS) 100 unit/mL SOPN Inject 60 Units into the skin 2 (two) times daily.   Marland Kitchen levothyroxine (SYNTHROID, LEVOTHROID) 50 MCG tablet Take 50 mcg by mouth daily.   Marland Kitchen losartan (COZAAR) 50 MG tablet Take 1 tablet (50 mg total) by mouth daily.  . ondansetron (ZOFRAN ODT) 4 MG disintegrating tablet 4mg  ODT q4 hours prn nausea/vomit  . ondansetron (ZOFRAN) 4 MG tablet Take 4 mg by mouth daily as needed for nausea or vomiting.  Marland Kitchen tenofovir (VIREAD) 300 MG tablet Take 300 mg by mouth daily.  Marland Kitchen tiZANidine (ZANAFLEX) 4 MG tablet Take 4 mg by mouth 3 (three) times daily as needed for muscle spasms.   Marland Kitchen triamcinolone cream (KENALOG) 0.1 % Apply 1 application topically 3 (three) times daily.  . valACYclovir (VALTREX) 1000 MG tablet Take 1 g by  mouth daily as needed. For breakouts  . venlafaxine XR (EFFEXOR-XR) 37.5 MG 24 hr capsule Take 37.5 mg by mouth daily.  Marland Kitchen zolpidem (AMBIEN) 10 MG tablet Take 10 mg by mouth at bedtime as needed for sleep.   Marland Kitchen ZOVIRAX 5 % Apply 1 application topically 3 (three) times daily.    Current Facility-Administered Medications (Other)  Medication Route  . Bevacizumab (AVASTIN) SOLN 1.25 mg Intravitreal  . Bevacizumab (AVASTIN) SOLN 1.25 mg Intravitreal  . Bevacizumab (AVASTIN) SOLN 1.25 mg Intravitreal      REVIEW OF SYSTEMS: ROS    Positive for: Musculoskeletal, Endocrine, Eyes   Negative for: Constitutional, Gastrointestinal, Neurological, Skin, Genitourinary, HENT,  Cardiovascular, Respiratory, Psychiatric, Allergic/Imm, Heme/Lymph   Last edited by Eldridge Scot, LPN on 1/61/0960  3:24 PM. (History)       ALLERGIES No Known Allergies  PAST MEDICAL HISTORY Past Medical History:  Diagnosis Date  . COPD (chronic obstructive pulmonary disease) (HCC)   . Depression   . Diabetes mellitus without complication (HCC)   . Heart murmur   . Hepatic cirrhosis (HCC)   . Hypertension   . Obesity   . Thyroid disease    Past Surgical History:  Procedure Laterality Date  . CESAREAN SECTION    . ESOPHAGOGASTRODUODENOSCOPY (EGD) WITH PROPOFOL N/A 09/01/2017   Procedure: ESOPHAGOGASTRODUODENOSCOPY (EGD) WITH PROPOFOL;  Surgeon: Kathi Der, MD;  Location: [facility];  Service: Gastroenterology;  Laterality: N/A;  . THYROIDECTOMY      FAMILY HISTORY Family History  Problem Relation Age of Onset  . Cancer Mother   . Hypertension Other     SOCIAL HISTORY Social History   Tobacco Use  . Smoking status: Never Smoker  . Smokeless tobacco: Never Used  Substance Use Topics  . Alcohol use: Not Currently  . Drug use: No         OPHTHALMIC EXAM:  Base Eye Exam    Visual Acuity (Snellen - Linear)      Right Left   Dist [place] 20/150 -1 20/60   Dist ph [place] 20/150 20/40       Tonometry (Tonopen, 2:57 PM)      Right Left   Pressure 17 15       Pupils      Dark Light Shape React APD   Right 4 3 Round Brisk None   Left 4 3 Round Brisk None       Visual Fields (Counting fingers)      Left Right    Full Full       Extraocular Movement      Right Left    Full, Ortho Full, Ortho       Neuro/Psych    Oriented x3:  Yes   Mood/Affect:  Normal       Dilation    Both eyes:  1.0% Mydriacyl, 2.5% Phenylephrine @ 2:56 PM        Slit Lamp and Fundus Exam    Slit Lamp Exam      Right Left   Lids/Lashes Dermatochalasis - upper lid Dermatochalasis - upper lid   Conjunctiva/Sclera Melanosis Melanosis   Cornea Arcus, 2+ diffuse  Punctate epithelial erosions Arcus, 2+ diffuse Punctate epithelial erosions   Anterior Chamber Deep and quiet Deep and quiet   Iris Round and dilated Round and dilated   Lens 1+ Nuclear sclerosis, 2+ Cortical cataract 1+ Nuclear sclerosis, 2+ Cortical cataract   Vitreous Trace Vitreous syneresis Trace Vitreous syneresis       Fundus Exam  Right Left   Disc Pink and Sharp Pink and Sharp   C/D Ratio 0.4 0.5   Macula Blunted foveal reflex, +central edema with IRF/SRF, persistent edema, Exudates inferior to macula, Intraretinal hemorrhage, CWS SN to fovea shrinking Good foveal reflex, mild thickening superior to fovea, cluster of Microaneurysms superior to fovea with +edema, scattered Intraretinal hemorrhage, scattered DBH   Vessels Dilated and Tortuous veins, +beading -- slight improvement, Copper wiring Dilated and Tortuous veins, Copper wiring   Periphery Attached, +IRH, +CWS -- scattered surrounding disc, scattered posterior DBH Attached, 360 IRH and CWS superiorly and around nerve          IMAGING AND PROCEDURES  Imaging and Procedures for @TODAY @  OCT, Retina - OU - Both Eyes       Right Eye Quality was good. Central Foveal Thickness: 354. Progression has improved. Findings include abnormal foveal contour, intraretinal fluid, subretinal fluid (Interval improvement in IRF/SRF).   Left Eye Quality was good. Central Foveal Thickness: 265. Progression has worsened. Findings include normal foveal contour, intraretinal fluid, no SRF (persistent IRF superior to fovea).   Notes *Images captured and stored on drive  Diagnosis / Impression:  OD: +central DME -- interval improvement in IRF/SRF OS: +DME superior to fovea -- persistent IRF superior to fovea -- slightly increased  Clinical management:  See below  Abbreviations: NFP - Normal foveal profile. CME - cystoid macular edema. PED - pigment epithelial detachment. IRF - intraretinal fluid. SRF - subretinal fluid. EZ -  ellipsoid zone. ERM - epiretinal membrane. ORA - outer retinal atrophy. ORT - outer retinal tubulation. SRHM - subretinal hyper-reflective material         Intravitreal Injection, Pharmacologic Agent - OD - Right Eye       Time Out 09/16/2017. 4:04 PM. Confirmed correct patient, procedure, site, and patient consented.   Anesthesia Topical anesthesia was used. Anesthetic medications included Lidocaine 2%, Tetracaine 0.5%.   Procedure Preparation included 5% betadine to ocular surface, eyelid speculum. A supplied needle was used.   Injection:  1.25 mg Bevacizumab 1.25mg /0.35ml   NDC: 50242-060-01, Lot: [id number] , Expiration date: 10/28/2017   Route: Intravitreal, Site: Right Eye, Waste: 0 mL  Post-op Post injection exam found visual acuity of at least counting fingers. The patient tolerated the procedure well. There were no complications. The patient received written and verbal post procedure care education.                 ASSESSMENT/PLAN:    ICD-10-CM   1. Severe nonproliferative diabetic retinopathy of both eyes with macular edema associated with type 2 diabetes mellitus (HCC) E11.3413 OCT, Retina - OU - Both Eyes    Intravitreal Injection, Pharmacologic Agent - OD - Right Eye    Bevacizumab (AVASTIN) SOLN 1.25 mg  2. Retinal edema H35.81 OCT, Retina - OU - Both Eyes  3. Essential hypertension I10   4. Hypertensive retinopathy of both eyes H35.033   5. 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 Severe Non-proliferative diabetic retinopathy, OU - exam with extensive 360 IRH, CWS and venous beading OU - initial FA shows late leaking MAs OU, No NV - OCT shows diabetic macular edema, OU (OD > OS, OS non-central) - S/P IVA OD #1 (07.18.19), #2 (08.14.19) - OCT shows interval improvement in DME OD; OS with slight increase in non-central - BCVA down today to 20/150, but significant corneal dryness OD - recommend IVA #3 OD today, (09.11.19) -  pt wishes to  proceed - RBA of procedure discussed, questions answered - informed consent obtained and signed - see procedure note - f/u in 2 wks focal laser OS and then 2 wks after that for repeat DFE,OCT and possible injection OD  3,4. Hypertensive retinopathy OU - discussed importance of tight BP control - monitor  5. Combined form age related cataract OU-  - The symptoms of cataract, surgical options, and treatments and risks were discussed with patient. - discussed diagnosis and progression - not yet visually significant - monitor for now   Ophthalmic Meds Ordered this visit:  Meds ordered this encounter  Medications  . Bevacizumab (AVASTIN) SOLN 1.25 mg       Return in about 2 weeks (around 09/30/2017) for F/U NPDR OU, DFE, OCT, FA.  There are no Patient Instructions on file for this visit.  This document serves as a record of services personally performed by Karie Chimera, MD, PhD. It was created on their behalf by Laurian Brim, OA, an ophthalmic assistant. The creation of this record is the provider's dictation and/or activities during the visit.    Electronically signed by: Laurian Brim, OA  09.10.19 4:32 PM    Karie Chimera, M.D., Ph.D. Diseases & Surgery of the Retina and Vitreous [facility]  I have reviewed the above documentation for accuracy and completeness, and I agree with the above. Karie Chimera, M.D., Ph.D. 09/16/17 4:35 PM     Abbreviations: M myopia (nearsighted); A astigmatism; H hyperopia (farsighted); P presbyopia; Mrx spectacle prescription;  CTL contact lenses; OD right eye; OS left eye; OU both eyes  XT exotropia; ET esotropia; PEK punctate epithelial keratitis; PEE punctate epithelial erosions; DES dry eye syndrome; MGD meibomian gland dysfunction; ATs artificial tears; PFAT's preservative free artificial tears; NSC nuclear sclerotic cataract; PSC posterior subcapsular cataract; ERM epi-retinal membrane; PVD  posterior vitreous detachment; RD retinal detachment; DM diabetes mellitus; DR diabetic retinopathy; NPDR non-proliferative diabetic retinopathy; PDR proliferative diabetic retinopathy; CSME clinically significant macular edema; DME diabetic macular edema; dbh dot blot hemorrhages; CWS cotton wool spot; POAG primary open angle glaucoma; C/D cup-to-disc ratio; HVF humphrey visual field; GVF goldmann visual field; OCT optical coherence tomography; IOP intraocular pressure; BRVO Branch retinal vein occlusion; CRVO central retinal vein occlusion; CRAO central retinal artery occlusion; BRAO branch retinal artery occlusion; RT retinal tear; SB scleral buckle; PPV pars plana vitrectomy; VH Vitreous hemorrhage; PRP panretinal laser photocoagulation; IVK intravitreal kenalog; VMT vitreomacular traction; MH Macular hole;  NVD neovascularization of the disc; NVE neovascularization elsewhere; AREDS age related eye disease study; ARMD age related macular degeneration; POAG primary open angle glaucoma; EBMD epithelial/anterior basement membrane dystrophy; ACIOL anterior chamber intraocular lens; IOL intraocular lens; PCIOL posterior chamber intraocular lens; Phaco/IOL phacoemulsification with intraocular lens placement; PRK photorefractive keratectomy; LASIK laser assisted in situ keratomileusis; HTN hypertension; DM diabetes mellitus; COPD chronic obstructive pulmonary disease

## 2017-09-16 ENCOUNTER — Encounter (INDEPENDENT_AMBULATORY_CARE_PROVIDER_SITE_OTHER): Payer: Self-pay | Admitting: Ophthalmology

## 2017-09-16 ENCOUNTER — Ambulatory Visit (INDEPENDENT_AMBULATORY_CARE_PROVIDER_SITE_OTHER): Payer: Medicaid Other | Admitting: Ophthalmology

## 2017-09-16 DIAGNOSIS — I10 Essential (primary) hypertension: Secondary | ICD-10-CM

## 2017-09-16 DIAGNOSIS — H25813 Combined forms of age-related cataract, bilateral: Secondary | ICD-10-CM

## 2017-09-16 DIAGNOSIS — H35033 Hypertensive retinopathy, bilateral: Secondary | ICD-10-CM

## 2017-09-16 DIAGNOSIS — H3581 Retinal edema: Secondary | ICD-10-CM | POA: Diagnosis not present

## 2017-09-16 DIAGNOSIS — E113413 Type 2 diabetes mellitus with severe nonproliferative diabetic retinopathy with macular edema, bilateral: Secondary | ICD-10-CM

## 2017-09-16 MED ORDER — BEVACIZUMAB CHEMO INJECTION 1.25MG/0.05ML SYRINGE FOR KALEIDOSCOPE
1.2500 mg | INTRAVITREAL | Status: AC
  Administered 2017-09-16: 1.25 mg via INTRAVITREAL

## 2017-09-24 ENCOUNTER — Ambulatory Visit (INDEPENDENT_AMBULATORY_CARE_PROVIDER_SITE_OTHER): Payer: Medicaid Other | Admitting: Obstetrics and Gynecology

## 2017-09-24 ENCOUNTER — Encounter: Payer: Self-pay | Admitting: Obstetrics and Gynecology

## 2017-09-24 VITALS — BP 147/83 | HR 88 | Ht 67.0 in | Wt 305.7 lb

## 2017-09-24 DIAGNOSIS — A630 Anogenital (venereal) warts: Secondary | ICD-10-CM

## 2017-09-24 NOTE — Patient Instructions (Signed)

## 2017-09-24 NOTE — Progress Notes (Signed)
Patient is in the office for follow up, reports symptoms have improved since last visit. Pt states that warts are smaller since last visit.

## 2017-09-24 NOTE — Progress Notes (Signed)
Patient ID: Sheila Gibson, female   DOB: 06/09/1970, 47 y.o.   MRN: [id number] Ms Sheila Gibson is here for f/u of TCA Tx for genital warts  PE AF VSS Lungs clear Heart RRR GU small genital wart left labial treated x 3 with TCA. Tolerated well  A/P Genital warts F/U PRN

## 2017-09-29 NOTE — Progress Notes (Signed)
[facility] - Clinic Note  09/30/2017     CHIEF COMPLAINT Patient presents for Retina Follow Up   HISTORY OF PRESENT ILLNESS: Sheila Gibson is a 47 y.o. female who presents to the clinic today for:   HPI    Retina Follow Up    Patient presents with  Diabetic Retinopathy.  In both eyes.  This started 2 months ago.  Severity is mild.  Since onset it is stable.  I, the attending physician,  performed the HPI with the patient and updated documentation appropriately.          Comments    F/U NPDR OU. Patient states she has occasional floaters OD.,denies ocular pain and flashes. BS 155 this am, BS have been WINL. Pt is ready for tx today if indicted.       Last edited by Rennis ChrisZamora, Noam Karaffa, MD on 09/30/2017  4:15 PM. (History)      Referring physician: No referring provider defined for this encounter.  HISTORICAL INFORMATION:   Selected notes from the MEDICAL RECORD NUMBER Referred by Dr. Aura CampsMichael Spencer for concern of PDR and CSME OU LEE: 06.24.19 (M. Karleen HampshireSpencer) [BCVA: OD: 20/60-2 OS: 20/25] Ocular Hx-DME OU, PDR OU, cataract OU PMH-DM (taking Lantus and Novolog), Thyroid disease, HTN    CURRENT MEDICATIONS: Current Outpatient Medications (Ophthalmic Drugs)  Medication Sig  . prednisoLONE acetate (PRED FORTE) 1 % ophthalmic suspension Place 1 drop into the left eye 4 (four) times daily for 7 days.   No current facility-administered medications for this visit.  (Ophthalmic Drugs)   Current Outpatient Medications (Other)  Medication Sig  . acetaminophen (TYLENOL) 500 MG tablet Take 500 mg by mouth daily as needed for moderate pain.  Marland Kitchen. albuterol (PROVENTIL HFA;VENTOLIN HFA) 108 (90 BASE) MCG/ACT inhaler Inhale 1-2 puffs into the lungs every 6 (six) hours as needed for wheezing or shortness of breath.  Marland Kitchen. amLODipine (NORVASC) 5 MG tablet Take 5 mg by mouth daily.  Marland Kitchen. gabapentin (NEURONTIN) 800 MG tablet Take 800 mg by mouth 3 (three) times daily.   .  hydrOXYzine (ATARAX/VISTARIL) 25 MG tablet Take 25 mg by mouth 3 (three) times daily as needed. for anxiety  . ibuprofen (ADVIL,MOTRIN) 800 MG tablet Take 800 mg by mouth every 8 (eight) hours as needed for moderate pain.  Marland Kitchen. insulin aspart protamine- aspart (NOVOLOG MIX 70/30) (70-30) 100 UNIT/ML injection Inject 6 Units into the skin 2 (two) times daily with a meal.  . insulin glargine (LANTUS) 100 unit/mL SOPN Inject 60 Units into the skin 2 (two) times daily.   Marland Kitchen. levothyroxine (SYNTHROID, LEVOTHROID) 50 MCG tablet Take 50 mcg by mouth daily.   . ondansetron (ZOFRAN ODT) 4 MG disintegrating tablet 4mg  ODT q4 hours prn nausea/vomit  . ondansetron (ZOFRAN) 4 MG tablet Take 4 mg by mouth daily as needed for nausea or vomiting.  Marland Kitchen. tenofovir (VIREAD) 300 MG tablet Take 300 mg by mouth daily.  Marland Kitchen. tiZANidine (ZANAFLEX) 4 MG tablet Take 4 mg by mouth 3 (three) times daily as needed for muscle spasms.   Marland Kitchen. triamcinolone cream (KENALOG) 0.1 % Apply 1 application topically 3 (three) times daily.  . valACYclovir (VALTREX) 1000 MG tablet Take 1 g by mouth daily as needed. For breakouts  . venlafaxine XR (EFFEXOR-XR) 37.5 MG 24 hr capsule Take 37.5 mg by mouth daily.  Marland Kitchen. zolpidem (AMBIEN) 10 MG tablet Take 10 mg by mouth at bedtime as needed for sleep.   Marland Kitchen. ZOVIRAX 5 % Apply  1 application topically 3 (three) times daily.   Marland Kitchen amLODipine (NORVASC) 10 MG tablet Take 1 tablet (10 mg total) by mouth daily.  Marland Kitchen losartan (COZAAR) 50 MG tablet Take 1 tablet (50 mg total) by mouth daily.   Current Facility-Administered Medications (Other)  Medication Route  . Bevacizumab (AVASTIN) SOLN 1.25 mg Intravitreal  . Bevacizumab (AVASTIN) SOLN 1.25 mg Intravitreal  . Bevacizumab (AVASTIN) SOLN 1.25 mg Intravitreal      REVIEW OF SYSTEMS: ROS    Positive for: Endocrine, Eyes   Negative for: Constitutional, Gastrointestinal, Neurological, Skin, Genitourinary, Musculoskeletal, HENT, Cardiovascular, Respiratory, Psychiatric,  Allergic/Imm, Heme/Lymph   Last edited by Eldridge Scot, LPN on 1/61/0960  2:00 PM. (History)       ALLERGIES No Known Allergies  PAST MEDICAL HISTORY Past Medical History:  Diagnosis Date  . COPD (chronic obstructive pulmonary disease) (HCC)   . Depression   . Diabetes mellitus without complication (HCC)   . Heart murmur   . Hepatic cirrhosis (HCC)   . Hypertension   . Obesity   . Thyroid disease    Past Surgical History:  Procedure Laterality Date  . CESAREAN SECTION    . ESOPHAGOGASTRODUODENOSCOPY (EGD) WITH PROPOFOL N/A 09/01/2017   Procedure: ESOPHAGOGASTRODUODENOSCOPY (EGD) WITH PROPOFOL;  Surgeon: Kathi Der, MD;  Location: [facility];  Service: Gastroenterology;  Laterality: N/A;  . THYROIDECTOMY      FAMILY HISTORY Family History  Problem Relation Age of Onset  . Cancer Mother   . Hypertension Other     SOCIAL HISTORY Social History   Tobacco Use  . Smoking status: Never Smoker  . Smokeless tobacco: Never Used  Substance Use Topics  . Alcohol use: Not Currently  . Drug use: No         OPHTHALMIC EXAM:  Base Eye Exam    Visual Acuity (Snellen - Linear)      Right Left   Dist [place] 20/200 +1 20/40 +1   Dist ph [place] 20/200 20/25 -1       Tonometry (Tonopen, 2:02 PM)      Right Left   Pressure 17 16       Pupils      Dark Light Shape React APD   Right 4 3 Round Brisk None   Left 4 3 Round Brisk None       Visual Fields (Counting fingers)      Left Right    Full Full       Extraocular Movement      Right Left    Full, Ortho Full, Ortho       Neuro/Psych    Oriented x3:  Yes   Mood/Affect:  Normal       Dilation    Both eyes:  1.0% Mydriacyl, 2.5% Phenylephrine @ 2:01 PM        Slit Lamp and Fundus Exam    Slit Lamp Exam      Right Left   Lids/Lashes Dermatochalasis - upper lid Dermatochalasis - upper lid   Conjunctiva/Sclera Melanosis Melanosis   Cornea Arcus, 2+ diffuse Punctate epithelial erosions Arcus, 2+  diffuse Punctate epithelial erosions   Anterior Chamber Deep and quiet Deep and quiet   Iris Round and dilated Round and dilated   Lens 1+ Nuclear sclerosis, 2+ Cortical cataract 1+ Nuclear sclerosis, 2+ Cortical cataract   Vitreous Trace Vitreous syneresis Trace Vitreous syneresis       Fundus Exam      Right Left   Disc Pink and Sharp  Pink and Sharp   C/D Ratio 0.4 0.5   Macula Blunted foveal reflex, +central edema with IRF/SRF, persistent edema, Exudates inferior to macula, Intraretinal hemorrhage, CWS SN to fovea shrinking Good foveal reflex, mild thickening superior to fovea, cluster of Microaneurysms superior to fovea with +edema, scattered Intraretinal hemorrhage, scattered DBH   Vessels Dilated and Tortuous veins, +beading -- slight improvement, Copper wiring Dilated and Tortuous veins, Copper wiring   Periphery Attached, +IRH, +CWS -- scattered surrounding disc, scattered posterior DBH Attached, 360 IRH and CWS superiorly and around nerve          IMAGING AND PROCEDURES  Imaging and Procedures for @TODAY @  OCT, Retina - OU - Both Eyes       Right Eye Quality was good. Central Foveal Thickness: 350. Progression has worsened. Findings include abnormal foveal contour, intraretinal fluid, subretinal fluid (Mild interval increase in IRF).   Left Eye Quality was good. Central Foveal Thickness: 267. Progression has been stable. Findings include normal foveal contour, intraretinal fluid, no SRF (persistent IRF superior to fovea).   Notes *Images captured and stored on drive  Diagnosis / Impression:  OD: +central DME -- interval increase in IRF OS: +DME superior to fovea -- persistent IRF superior to fovea  Clinical management:  See below  Abbreviations: NFP - Normal foveal profile. CME - cystoid macular edema. PED - pigment epithelial detachment. IRF - intraretinal fluid. SRF - subretinal fluid. EZ - ellipsoid zone. ERM - epiretinal membrane. ORA - outer retinal atrophy.  ORT - outer retinal tubulation. SRHM - subretinal hyper-reflective material         Fluorescein Angiography Optos (Transit OD)       Right Eye   Progression has improved. Early phase findings include microaneurysm. Mid/Late phase findings include microaneurysm, leakage (Interval improvement in leakage).   Left Eye   Progression has improved. Early phase findings include microaneurysm. Mid/Late phase findings include leakage, microaneurysm (Interval improvement in leakage).   Notes *Images captured and stored on drive;   Impression: Low fluorescein signal OU Late leaking MAs OU -- interval improvement No NV Severe NPDR          Focal Laser - OS - Left Eye       LASER PROCEDURE NOTE  Diagnosis:   Diabetic macular edema, LEFT EYE  Procedure:  Focal laser photocoagulation using slit lamp laser, LEFT EYE  Anesthesia:  Topical  Surgeon: Rennis Chris, MD, PhD   Informed consent obtained, operative eye marked, and time out performed prior to initiation of laser.   Lumenis [id number] Focal/Grid laser Power: 100 mW Duration: 50 msec  Spot size: 50 microns  # spots: 48 spots placed to MAs superior to fovea and in superotemporal arcade.  Complications: None.  RTC: 2 wks  Patient tolerated the procedure well and received written and verbal post-procedure care information/education.                   ASSESSMENT/PLAN:    ICD-10-CM   1. Severe nonproliferative diabetic retinopathy of both eyes with macular edema associated with type 2 diabetes mellitus (HCC) E11.3413 OCT, Retina - OU - Both Eyes    Fluorescein Angiography Optos (Transit OD)    Focal Laser - OS - Left Eye  2. Retinal edema H35.81 OCT, Retina - OU - Both Eyes  3. Essential hypertension I10   4. Hypertensive retinopathy of both eyes H35.033 Fluorescein Angiography Optos (Transit OD)  5. Combined forms of age-related cataract of both eyes H25.813  1,2. Severe Non-proliferative  diabetic retinopathy, OU - exam with extensive 360 IRH, CWS and venous beading OU - initial FA shows late leaking MAs OU, No NV - OCT shows diabetic macular edema, OU (OD > OS, OS non-central) - S/P IVA OD #1 (07.18.19), #2 (08.14.19), #3 (09.11.19) - OCT shows mild interval increase in DME OD; OS with persistent superior DME - BCVA down today to 20/200, but significant corneal dryness OD - recommend focal laser OS today (09.25.19) - pt wishes to proceed - RBA of procedure discussed, questions answered - informed consent obtained and signed - see procedure note - f/u in 2 wks repeat DFE,OCT and possible injection OD  3,4. Hypertensive retinopathy OU - discussed importance of tight BP control - monitor  5. Combined form age related cataract OU-  - The symptoms of cataract, surgical options, and treatments and risks were discussed with patient. - discussed diagnosis and progression - not yet visually significant - monitor for now   Ophthalmic Meds Ordered this visit:  Meds ordered this encounter  Medications  . prednisoLONE acetate (PRED FORTE) 1 % ophthalmic suspension    Sig: Place 1 drop into the left eye 4 (four) times daily for 7 days.    Dispense:  10 mL    Refill:  0       Return in about 2 weeks (around 10/14/2017) for F/U NPDR OU, DFE, OCT.  There are no Patient Instructions on file for this visit.  This document serves as a record of services personally performed by Karie Chimera, MD, PhD. It was created on their behalf by Laurian Brim, OA, an ophthalmic assistant. The creation of this record is the provider's dictation and/or activities during the visit.    Electronically signed by: Laurian Brim, OA  09.24.19 11:38 PM   This document serves as a record of services personally performed by Karie Chimera, MD, PhD. It was created on their behalf by Virgilio Belling, COA, a certified ophthalmic assistant. The creation of this record is the provider's dictation and/or  activities during the visit.  Electronically signed by: Virgilio Belling, COA  09.25.19 11:38 PM   Karie Chimera, M.D., Ph.D. Diseases & Surgery of the Retina and Vitreous [facility]  I have reviewed the above documentation for accuracy and completeness, and I agree with the above. Karie Chimera, M.D., Ph.D. 10/04/17 11:41 PM    Abbreviations: M myopia (nearsighted); A astigmatism; H hyperopia (farsighted); P presbyopia; Mrx spectacle prescription;  CTL contact lenses; OD right eye; OS left eye; OU both eyes  XT exotropia; ET esotropia; PEK punctate epithelial keratitis; PEE punctate epithelial erosions; DES dry eye syndrome; MGD meibomian gland dysfunction; ATs artificial tears; PFAT's preservative free artificial tears; NSC nuclear sclerotic cataract; PSC posterior subcapsular cataract; ERM epi-retinal membrane; PVD posterior vitreous detachment; RD retinal detachment; DM diabetes mellitus; DR diabetic retinopathy; NPDR non-proliferative diabetic retinopathy; PDR proliferative diabetic retinopathy; CSME clinically significant macular edema; DME diabetic macular edema; dbh dot blot hemorrhages; CWS cotton wool spot; POAG primary open angle glaucoma; C/D cup-to-disc ratio; HVF humphrey visual field; GVF goldmann visual field; OCT optical coherence tomography; IOP intraocular pressure; BRVO Branch retinal vein occlusion; CRVO central retinal vein occlusion; CRAO central retinal artery occlusion; BRAO branch retinal artery occlusion; RT retinal tear; SB scleral buckle; PPV pars plana vitrectomy; VH Vitreous hemorrhage; PRP panretinal laser photocoagulation; IVK intravitreal kenalog; VMT vitreomacular traction; MH Macular hole;  NVD neovascularization of the disc; NVE neovascularization elsewhere; AREDS  age related eye disease study; ARMD age related macular degeneration; POAG primary open angle glaucoma; EBMD epithelial/anterior basement membrane dystrophy; ACIOL anterior  chamber intraocular lens; IOL intraocular lens; PCIOL posterior chamber intraocular lens; Phaco/IOL phacoemulsification with intraocular lens placement; PRK photorefractive keratectomy; LASIK laser assisted in situ keratomileusis; HTN hypertension; DM diabetes mellitus; COPD chronic obstructive pulmonary disease

## 2017-09-30 ENCOUNTER — Ambulatory Visit (INDEPENDENT_AMBULATORY_CARE_PROVIDER_SITE_OTHER): Payer: Medicaid Other | Admitting: Ophthalmology

## 2017-09-30 DIAGNOSIS — H25813 Combined forms of age-related cataract, bilateral: Secondary | ICD-10-CM

## 2017-09-30 DIAGNOSIS — E113413 Type 2 diabetes mellitus with severe nonproliferative diabetic retinopathy with macular edema, bilateral: Secondary | ICD-10-CM | POA: Diagnosis not present

## 2017-09-30 DIAGNOSIS — H35033 Hypertensive retinopathy, bilateral: Secondary | ICD-10-CM

## 2017-09-30 DIAGNOSIS — I10 Essential (primary) hypertension: Secondary | ICD-10-CM

## 2017-09-30 DIAGNOSIS — H3581 Retinal edema: Secondary | ICD-10-CM

## 2017-09-30 MED ORDER — PREDNISOLONE ACETATE 1 % OP SUSP: 1.0000 [drp] | Freq: Four times a day (QID) | OPHTHALMIC | 0 refills | Status: AC

## 2017-10-04 ENCOUNTER — Encounter (INDEPENDENT_AMBULATORY_CARE_PROVIDER_SITE_OTHER): Payer: Self-pay | Admitting: Ophthalmology

## 2017-10-13 NOTE — Progress Notes (Signed)
[facility] - Clinic Note  10/19/2017     CHIEF COMPLAINT Patient presents for Retina Follow Up   HISTORY OF PRESENT ILLNESS: Sheila Gibson is a 47 y.o. female who presents to the clinic today for:   HPI    Retina Follow Up    Patient presents with  Diabetic Retinopathy.  In both eyes.  This started 2 months ago.  Severity is mild.  Since onset it is stable.  I, the attending physician,  performed the HPI with the patient and updated documentation appropriately.          Comments    F/U NPDR OU. Patient states her vision is about the same, denies new visual onsets. BS 150. Bs have been stable per patient Pt is ready for tx today if indicated.       Last edited by Rennis Chris, MD on 10/19/2017  5:39 PM. (History)      Referring physician: No referring provider defined for this encounter.  HISTORICAL INFORMATION:   Selected notes from the MEDICAL RECORD NUMBER Referred by Dr. Aura Camps for concern of PDR and CSME OU LEE: 06.24.19 Kirk Ruths) [BCVA: OD: 20/60-2 OS: 20/25] Ocular Hx-DME OU, PDR OU, cataract OU PMH-DM (taking Lantus and Novolog), Thyroid disease, HTN    CURRENT MEDICATIONS: No current outpatient medications on file. (Ophthalmic Drugs)   No current facility-administered medications for this visit.  (Ophthalmic Drugs)   Current Outpatient Medications (Other)  Medication Sig  . acetaminophen (TYLENOL) 500 MG tablet Take 500 mg by mouth daily as needed for moderate pain.  Marland Kitchen albuterol (PROVENTIL HFA;VENTOLIN HFA) 108 (90 BASE) MCG/ACT inhaler Inhale 1-2 puffs into the lungs every 6 (six) hours as needed for wheezing or shortness of breath.  Marland Kitchen amLODipine (NORVASC) 5 MG tablet Take 5 mg by mouth daily.  Marland Kitchen gabapentin (NEURONTIN) 800 MG tablet Take 800 mg by mouth 3 (three) times daily.   . hydrOXYzine (ATARAX/VISTARIL) 25 MG tablet Take 25 mg by mouth 3 (three) times daily as needed. for anxiety  . ibuprofen (ADVIL,MOTRIN)  800 MG tablet Take 800 mg by mouth every 8 (eight) hours as needed for moderate pain.  Marland Kitchen insulin aspart protamine- aspart (NOVOLOG MIX 70/30) (70-30) 100 UNIT/ML injection Inject 6 Units into the skin 2 (two) times daily with a meal.  . insulin glargine (LANTUS) 100 unit/mL SOPN Inject 60 Units into the skin 2 (two) times daily.   Marland Kitchen levothyroxine (SYNTHROID, LEVOTHROID) 50 MCG tablet Take 50 mcg by mouth daily.   . ondansetron (ZOFRAN ODT) 4 MG disintegrating tablet 4mg  ODT q4 hours prn nausea/vomit  . ondansetron (ZOFRAN) 4 MG tablet Take 4 mg by mouth daily as needed for nausea or vomiting.  Marland Kitchen tenofovir (VIREAD) 300 MG tablet Take 300 mg by mouth daily.  Marland Kitchen tiZANidine (ZANAFLEX) 4 MG tablet Take 4 mg by mouth 3 (three) times daily as needed for muscle spasms.   Marland Kitchen triamcinolone cream (KENALOG) 0.1 % Apply 1 application topically 3 (three) times daily.  . valACYclovir (VALTREX) 1000 MG tablet Take 1 g by mouth daily as needed. For breakouts  . venlafaxine XR (EFFEXOR-XR) 37.5 MG 24 hr capsule Take 37.5 mg by mouth daily.  Marland Kitchen zolpidem (AMBIEN) 10 MG tablet Take 10 mg by mouth at bedtime as needed for sleep.   Marland Kitchen ZOVIRAX 5 % Apply 1 application topically 3 (three) times daily.   Marland Kitchen amLODipine (NORVASC) 10 MG tablet Take 1 tablet (10 mg total) by mouth  daily.  . losartan (COZAAR) 50 MG tablet Take 1 tablet (50 mg total) by mouth daily.   Current Facility-Administered Medications (Other)  Medication Route  . Bevacizumab (AVASTIN) SOLN 1.25 mg Intravitreal  . Bevacizumab (AVASTIN) SOLN 1.25 mg Intravitreal  . Bevacizumab (AVASTIN) SOLN 1.25 mg Intravitreal  . Bevacizumab (AVASTIN) SOLN 1.25 mg Intravitreal      REVIEW OF SYSTEMS: ROS    Positive for: Endocrine, Eyes   Negative for: Constitutional, Gastrointestinal, Neurological, Skin, Genitourinary, Musculoskeletal, HENT, Cardiovascular, Respiratory, Psychiatric, Allergic/Imm, Heme/Lymph   Last edited by Eldridge Scot, LPN on 16/10/9602  2:21  PM. (History)       ALLERGIES No Known Allergies  PAST MEDICAL HISTORY Past Medical History:  Diagnosis Date  . COPD (chronic obstructive pulmonary disease) (HCC)   . Depression   . Diabetes mellitus without complication (HCC)   . Heart murmur   . Hepatic cirrhosis (HCC)   . Hypertension   . Obesity   . Thyroid disease    Past Surgical History:  Procedure Laterality Date  . CESAREAN SECTION    . ESOPHAGOGASTRODUODENOSCOPY (EGD) WITH PROPOFOL N/A 09/01/2017   Procedure: ESOPHAGOGASTRODUODENOSCOPY (EGD) WITH PROPOFOL;  Surgeon: Kathi Der, MD;  Location: [facility];  Service: Gastroenterology;  Laterality: N/A;  . THYROIDECTOMY      FAMILY HISTORY Family History  Problem Relation Age of Onset  . Cancer Mother   . Hypertension Other     SOCIAL HISTORY Social History   Tobacco Use  . Smoking status: Never Smoker  . Smokeless tobacco: Never Used  Substance Use Topics  . Alcohol use: Not Currently  . Drug use: No         OPHTHALMIC EXAM:  Base Eye Exam    Visual Acuity (Snellen - Linear)      Right Left   Dist [place] 20/150 -1 20/50   Dist ph [place] 20/100 20/30       Tonometry (Tonopen, 2:30 PM)      Right Left   Pressure 16 16       Pupils      Dark Light Shape React APD   Right 4 3 Round Brisk None   Left 4 3 Round Brisk None       Visual Fields (Counting fingers)      Left Right    Full Full       Extraocular Movement      Right Left    Full, Ortho Full, Ortho       Neuro/Psych    Oriented x3:  Yes   Mood/Affect:  Normal       Dilation    Both eyes:  1.0% Mydriacyl, 2.5% Phenylephrine @ 2:26 PM        Slit Lamp and Fundus Exam    Slit Lamp Exam      Right Left   Lids/Lashes Dermatochalasis - upper lid Dermatochalasis - upper lid   Conjunctiva/Sclera Melanosis Melanosis   Cornea Arcus, 2+ diffuse Punctate epithelial erosions Arcus, 2+ diffuse Punctate epithelial erosions   Anterior Chamber Deep and quiet Deep and quiet    Iris Round and dilated Round and dilated   Lens 1+ Nuclear sclerosis, 2+ Cortical cataract 1+ Nuclear sclerosis, 2+ Cortical cataract   Vitreous Trace Vitreous syneresis Trace Vitreous syneresis       Fundus Exam      Right Left   Disc Pink and Sharp Pink and Sharp   C/D Ratio 0.4 0.5   Macula Blunted foveal reflex, interval improvement  in central edema with IRF/SRF, persistent edema, Exudates inferior to macula, Intraretinal hemorrhage, CWS SN to fovea shrinking Good foveal reflex, mild thickening superior to fovea, cluster of Microaneurysms superior to fovea with +edema, scattered Intraretinal hemorrhage, scattered DBH, mild focal laser scars superior to macula   Vessels Dilated and Tortuous veins, +beading -- slight improvement, Copper wiring Dilated and Tortuous veins, Copper wiring   Periphery Attached, +IRH, +CWS -- scattered surrounding disc, scattered posterior DBH Attached, 360 IRH and CWS superiorly and around nerve, Cotton wool spots and DBH nasal to fovea          IMAGING AND PROCEDURES  Imaging and Procedures for @TODAY @  OCT, Retina - OU - Both Eyes       Right Eye Quality was good. Central Foveal Thickness: 319. Progression has improved. Findings include abnormal foveal contour, intraretinal fluid, subretinal fluid (Mild interval improvement in IRF, persistent SRF).   Left Eye Quality was good. Central Foveal Thickness: 273. Progression has been stable. Findings include normal foveal contour, intraretinal fluid, no SRF (persistent IRF superior to fovea).   Notes *Images captured and stored on drive  Diagnosis / Impression:  OD: +central DME -- interval improvement in IRF OS: +DME superior to fovea -- persistent IRF superior to fovea  Clinical management:  See below  Abbreviations: NFP - Normal foveal profile. CME - cystoid macular edema. PED - pigment epithelial detachment. IRF - intraretinal fluid. SRF - subretinal fluid. EZ - ellipsoid zone. ERM - epiretinal  membrane. ORA - outer retinal atrophy. ORT - outer retinal tubulation. SRHM - subretinal hyper-reflective material         Intravitreal Injection, Pharmacologic Agent - OD - Right Eye       Time Out 10/19/2017. 3:58 PM. Confirmed correct patient, procedure, site, and patient consented.   Anesthesia Topical anesthesia was used. Anesthetic medications included Lidocaine 2%, Proparacaine 0.5%.   Procedure Preparation included 5% betadine to ocular surface, eyelid speculum. A 30 gauge needle was used.   Injection:  1.25 mg Bevacizumab 1.25mg /0.36ml   NDC: 50242-060-01, Lot: [id number] , Expiration date: 12/17/2017   Route: Intravitreal, Site: Right Eye, Waste: 0 mg  Post-op Post injection exam found visual acuity of at least counting fingers. The patient tolerated the procedure well. There were no complications. The patient received written and verbal post procedure care education.                 ASSESSMENT/PLAN:    ICD-10-CM   1. Severe nonproliferative diabetic retinopathy of both eyes with macular edema associated with type 2 diabetes mellitus (HCC) E11.3413 OCT, Retina - OU - Both Eyes    Intravitreal Injection, Pharmacologic Agent - OD - Right Eye    Bevacizumab (AVASTIN) SOLN 1.25 mg  2. Retinal edema H35.81 OCT, Retina - OU - Both Eyes  3. Essential hypertension I10   4. Hypertensive retinopathy of both eyes H35.033   5. 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 Severe Non-proliferative diabetic retinopathy, OU - exam with extensive 360 IRH, CWS and venous beading OU - initial FA shows late leaking MAs OU, No NV - OCT shows diabetic macular edema, OU (OD > OS, OS non-central) - S/P IVA OD #1 (07.18.19), #2 (08.14.19), #3 (09.11.19) - S/P focal laser OS (09.25.19) - OCT shows interval decrease in DME OD; OS with persistent superior DME - BCVA 20/100 OD, 20/30 OS - recommend IVA OD #4 today (10.14.19) - pt wishes to proceed - RBA  of procedure discussed, questions answered - informed consent obtained and signed - see procedure note - f/u in 4 wks repeat DFE,OCT and possible injection OD  3,4. Hypertensive retinopathy OU - discussed importance of tight BP control - monitor  5. Combined form age related cataract OU-  - The symptoms of cataract, surgical options, and treatments and risks were discussed with patient. - discussed diagnosis and progression - not yet visually significant - monitor for now   Ophthalmic Meds Ordered this visit:  Meds ordered this encounter  Medications  . Bevacizumab (AVASTIN) SOLN 1.25 mg       Return in about 4 weeks (around 11/16/2017) for Dilated Exam, OCT, Possible Injxn.  There are no Patient Instructions on file for this visit.  This document serves as a record of services personally performed by Karie Chimera, MD, PhD. It was created on their behalf by Laurian Brim, OA, an ophthalmic assistant. The creation of this record is the provider's dictation and/or activities during the visit.    Electronically signed by: Laurian Brim, OA  10.08.19 5:40 PM    Karie Chimera, M.D., Ph.D. Diseases & Surgery of the Retina and Vitreous [facility]   I have reviewed the above documentation for accuracy and completeness, and I agree with the above. Karie Chimera, M.D., Ph.D. 10/19/17 5:40 PM    Abbreviations: M myopia (nearsighted); A astigmatism; H hyperopia (farsighted); P presbyopia; Mrx spectacle prescription;  CTL contact lenses; OD right eye; OS left eye; OU both eyes  XT exotropia; ET esotropia; PEK punctate epithelial keratitis; PEE punctate epithelial erosions; DES dry eye syndrome; MGD meibomian gland dysfunction; ATs artificial tears; PFAT's preservative free artificial tears; NSC nuclear sclerotic cataract; PSC posterior subcapsular cataract; ERM epi-retinal membrane; PVD posterior vitreous detachment; RD retinal detachment; DM diabetes  mellitus; DR diabetic retinopathy; NPDR non-proliferative diabetic retinopathy; PDR proliferative diabetic retinopathy; CSME clinically significant macular edema; DME diabetic macular edema; dbh dot blot hemorrhages; CWS cotton wool spot; POAG primary open angle glaucoma; C/D cup-to-disc ratio; HVF humphrey visual field; GVF goldmann visual field; OCT optical coherence tomography; IOP intraocular pressure; BRVO Branch retinal vein occlusion; CRVO central retinal vein occlusion; CRAO central retinal artery occlusion; BRAO branch retinal artery occlusion; RT retinal tear; SB scleral buckle; PPV pars plana vitrectomy; VH Vitreous hemorrhage; PRP panretinal laser photocoagulation; IVK intravitreal kenalog; VMT vitreomacular traction; MH Macular hole;  NVD neovascularization of the disc; NVE neovascularization elsewhere; AREDS age related eye disease study; ARMD age related macular degeneration; POAG primary open angle glaucoma; EBMD epithelial/anterior basement membrane dystrophy; ACIOL anterior chamber intraocular lens; IOL intraocular lens; PCIOL posterior chamber intraocular lens; Phaco/IOL phacoemulsification with intraocular lens placement; PRK photorefractive keratectomy; LASIK laser assisted in situ keratomileusis; HTN hypertension; DM diabetes mellitus; COPD chronic obstructive pulmonary disease

## 2017-10-14 ENCOUNTER — Ambulatory Visit: Payer: Medicaid Other | Admitting: Obstetrics and Gynecology

## 2017-10-14 ENCOUNTER — Encounter: Payer: Self-pay | Admitting: Obstetrics and Gynecology

## 2017-10-14 VITALS — BP 150/85 | HR 83 | Wt 305.0 lb

## 2017-10-14 DIAGNOSIS — A630 Anogenital (venereal) warts: Secondary | ICD-10-CM

## 2017-10-14 NOTE — Progress Notes (Signed)
RGYN presents for Follow up visit today on genital wart.per note on 09/24/17 F/U PRN  Pt denies any pain states wart is not going away.

## 2017-10-15 NOTE — Progress Notes (Signed)
Patient ID: Sheila Gibson, female   DOB: 06/26/1970, 47 y.o.   MRN: [id number] Ms Wesche presents for further evaluation and treatment for her genital warts. See previous office visits  PE AF VSS Lungs clear Heart RRR GU small genital wart noted on left  TCA applied. Pt tolerated well  A/P Genital wart  F/U in 2-3 weeks for reevaluation

## 2017-10-19 ENCOUNTER — Encounter (INDEPENDENT_AMBULATORY_CARE_PROVIDER_SITE_OTHER): Payer: Self-pay | Admitting: Ophthalmology

## 2017-10-19 ENCOUNTER — Ambulatory Visit (INDEPENDENT_AMBULATORY_CARE_PROVIDER_SITE_OTHER): Payer: Medicaid Other | Admitting: Ophthalmology

## 2017-10-19 DIAGNOSIS — E113413 Type 2 diabetes mellitus with severe nonproliferative diabetic retinopathy with macular edema, bilateral: Secondary | ICD-10-CM | POA: Diagnosis not present

## 2017-10-19 DIAGNOSIS — H3581 Retinal edema: Secondary | ICD-10-CM | POA: Diagnosis not present

## 2017-10-19 DIAGNOSIS — H35033 Hypertensive retinopathy, bilateral: Secondary | ICD-10-CM

## 2017-10-19 DIAGNOSIS — I10 Essential (primary) hypertension: Secondary | ICD-10-CM

## 2017-10-19 DIAGNOSIS — H25813 Combined forms of age-related cataract, bilateral: Secondary | ICD-10-CM

## 2017-10-19 MED ORDER — BEVACIZUMAB CHEMO INJECTION 1.25MG/0.05ML SYRINGE FOR KALEIDOSCOPE
1.2500 mg | INTRAVITREAL | Status: AC
  Administered 2017-10-19: 1.25 mg via INTRAVITREAL

## 2017-11-13 ENCOUNTER — Ambulatory Visit: Payer: Medicaid Other | Admitting: Obstetrics and Gynecology

## 2017-11-13 NOTE — Progress Notes (Addendum)
[facility] - Clinic Note  11/16/2017     CHIEF COMPLAINT Patient presents for Retina Follow Up   HISTORY OF PRESENT ILLNESS: Sheila Gibson is a 47 y.o. female who presents to the clinic today for:   HPI    Retina Follow Up    Patient presents with  Diabetic Retinopathy.  In both eyes.  This started 3 months ago.  Severity is mild.  Since onset it is stable.  I, the attending physician,  performed the HPI with the patient and updated documentation appropriately.          Comments    F/U NPDR OU. Patient states "I feel my vision has gotten better",denies new visual onsets/issues. BS 188 this am, BS have been  WINL per patient. Pt is ready for tx today if indicated.       Last edited by Rennis Chris, MD on 11/16/2017  2:37 PM. (History)      Referring physician: No referring provider defined for this encounter.  HISTORICAL INFORMATION:   Selected notes from the MEDICAL RECORD NUMBER Referred by Dr. Aura Camps for concern of PDR and CSME OU LEE: 06.24.19 Kirk Ruths) [BCVA: OD: 20/60-2 OS: 20/25] Ocular Hx-DME OU, PDR OU, cataract OU PMH-DM (taking Lantus and Novolog), Thyroid disease, HTN    CURRENT MEDICATIONS: No current outpatient medications on file. (Ophthalmic Drugs)   No current facility-administered medications for this visit.  (Ophthalmic Drugs)   Current Outpatient Medications (Other)  Medication Sig  . acetaminophen (TYLENOL) 500 MG tablet Take 500 mg by mouth daily as needed for moderate pain.  Marland Kitchen albuterol (PROVENTIL HFA;VENTOLIN HFA) 108 (90 BASE) MCG/ACT inhaler Inhale 1-2 puffs into the lungs every 6 (six) hours as needed for wheezing or shortness of breath.  Marland Kitchen amLODipine (NORVASC) 5 MG tablet Take 5 mg by mouth daily.  Marland Kitchen gabapentin (NEURONTIN) 800 MG tablet Take 800 mg by mouth 3 (three) times daily.   . hydrOXYzine (ATARAX/VISTARIL) 25 MG tablet Take 25 mg by mouth 3 (three) times daily as needed. for anxiety  .  ibuprofen (ADVIL,MOTRIN) 800 MG tablet Take 800 mg by mouth every 8 (eight) hours as needed for moderate pain.  Marland Kitchen insulin aspart protamine- aspart (NOVOLOG MIX 70/30) (70-30) 100 UNIT/ML injection Inject 6 Units into the skin 2 (two) times daily with a meal.  . insulin glargine (LANTUS) 100 unit/mL SOPN Inject 60 Units into the skin 2 (two) times daily.   Marland Kitchen levothyroxine (SYNTHROID, LEVOTHROID) 50 MCG tablet Take 50 mcg by mouth daily.   . ondansetron (ZOFRAN ODT) 4 MG disintegrating tablet 4mg  ODT q4 hours prn nausea/vomit  . ondansetron (ZOFRAN) 4 MG tablet Take 4 mg by mouth daily as needed for nausea or vomiting.  Marland Kitchen tenofovir (VIREAD) 300 MG tablet Take 300 mg by mouth daily.  Marland Kitchen tiZANidine (ZANAFLEX) 4 MG tablet Take 4 mg by mouth 3 (three) times daily as needed for muscle spasms.   Marland Kitchen triamcinolone cream (KENALOG) 0.1 % Apply 1 application topically 3 (three) times daily.  . valACYclovir (VALTREX) 1000 MG tablet Take 1 g by mouth daily as needed. For breakouts  . venlafaxine XR (EFFEXOR-XR) 37.5 MG 24 hr capsule Take 37.5 mg by mouth daily.  Marland Kitchen zolpidem (AMBIEN) 10 MG tablet Take 10 mg by mouth at bedtime as needed for sleep.   Marland Kitchen ZOVIRAX 5 % Apply 1 application topically 3 (three) times daily.   Marland Kitchen amLODipine (NORVASC) 10 MG tablet Take 1 tablet (10 mg  total) by mouth daily.  Marland Kitchen losartan (COZAAR) 50 MG tablet Take 1 tablet (50 mg total) by mouth daily.   Current Facility-Administered Medications (Other)  Medication Route  . Bevacizumab (AVASTIN) SOLN 1.25 mg Intravitreal  . Bevacizumab (AVASTIN) SOLN 1.25 mg Intravitreal  . Bevacizumab (AVASTIN) SOLN 1.25 mg Intravitreal  . Bevacizumab (AVASTIN) SOLN 1.25 mg Intravitreal  . Bevacizumab (AVASTIN) SOLN 1.25 mg Intravitreal      REVIEW OF SYSTEMS: ROS    Positive for: Endocrine, Eyes   Negative for: Constitutional, Gastrointestinal, Neurological, Skin, Genitourinary, Musculoskeletal, HENT, Cardiovascular, Respiratory, Psychiatric,  Allergic/Imm, Heme/Lymph   Last edited by Eldridge Scot, LPN on 16/10/9602  1:25 PM. (History)       ALLERGIES No Known Allergies  PAST MEDICAL HISTORY Past Medical History:  Diagnosis Date  . COPD (chronic obstructive pulmonary disease) (HCC)   . Depression   . Diabetes mellitus without complication (HCC)   . Heart murmur   . Hepatic cirrhosis (HCC)   . Hypertension   . Obesity   . Thyroid disease    Past Surgical History:  Procedure Laterality Date  . CESAREAN SECTION    . ESOPHAGOGASTRODUODENOSCOPY (EGD) WITH PROPOFOL N/A 09/01/2017   Procedure: ESOPHAGOGASTRODUODENOSCOPY (EGD) WITH PROPOFOL;  Surgeon: Kathi Der, MD;  Location: [facility];  Service: Gastroenterology;  Laterality: N/A;  . THYROIDECTOMY      FAMILY HISTORY Family History  Problem Relation Age of Onset  . Cancer Mother   . Hypertension Other     SOCIAL HISTORY Social History   Tobacco Use  . Smoking status: Never Smoker  . Smokeless tobacco: Never Used  Substance Use Topics  . Alcohol use: Not Currently  . Drug use: No         OPHTHALMIC EXAM:  Base Eye Exam    Visual Acuity (Snellen - Linear)      Right Left   Dist [place] 20/100 -1 20/50   Dist ph [place] 20/40 -1 20/30       Tonometry (Tonopen, 1:22 PM)      Right Left   Pressure 16 17       Pupils      Dark Light Shape React APD   Right 4 3 Round Brisk None   Left 4 3 Round Brisk None       Visual Fields (Counting fingers)      Left Right    Full Full       Extraocular Movement      Right Left    Full, Ortho Full, Ortho       Neuro/Psych    Oriented x3:  Yes   Mood/Affect:  Normal       Dilation    Both eyes:  1.0% Mydriacyl, 2.5% Phenylephrine @ 1:17 PM        Slit Lamp and Fundus Exam    Slit Lamp Exam      Right Left   Lids/Lashes Dermatochalasis - upper lid Dermatochalasis - upper lid   Conjunctiva/Sclera Melanosis Melanosis   Cornea Arcus, 2+ diffuse Punctate epithelial erosions Arcus, 2+  diffuse Punctate epithelial erosions   Anterior Chamber Deep and quiet Deep and quiet   Iris Round and dilated Round and dilated   Lens 1+ Nuclear sclerosis, 2+ Cortical cataract 1+ Nuclear sclerosis, 2+ Cortical cataract   Vitreous Trace Vitreous syneresis Trace Vitreous syneresis       Fundus Exam      Right Left   Disc Pink and Sharp Pink and Sharp  C/D Ratio 0.4 0.5   Macula Blunted foveal reflex, interval improvement in central edema with IRF/SRF, parafoveal exudates inferior to macula, Intraretinal hemorrhage and CWS SN to fovea shrinking Good foveal reflex, mild thickening superior to fovea, cluster of Microaneurysms superior to fovea with +edema, scattered Intraretinal hemorrhage, scattered DBH, mild focal laser scars superior to macula   Vessels Dilated and Tortuous veins, +beading -- slight improvement, Copper wiring Dilated and Tortuous veins, Copper wiring   Periphery Attached, +IRH, +CWS -- scattered surrounding disc, scattered posterior DBH Attached, 360 IRH and CWS superiorly and around nerve, Cotton wool spots and DBH nasal to fovea          IMAGING AND PROCEDURES  Imaging and Procedures for @TODAY @  OCT, Retina - OU - Both Eyes       Right Eye Quality was good. Central Foveal Thickness: 212. Progression has improved. Findings include abnormal foveal contour, intraretinal fluid, no SRF, outer retinal atrophy (interval improvement in IRF, interval resolution of SRF).   Left Eye Quality was good. Central Foveal Thickness: 232. Progression has been stable. Findings include normal foveal contour, intraretinal fluid, no SRF (persistent IRF superior to fovea).   Notes *Images captured and stored on drive  Diagnosis / Impression:  OD: central DME improved -- interval improvement in IRF, interval resolution of SRF OS: +DME superior to fovea -- persistent IRF superior to fovea  Clinical management:  See below  Abbreviations: NFP - Normal foveal profile. CME - cystoid  macular edema. PED - pigment epithelial detachment. IRF - intraretinal fluid. SRF - subretinal fluid. EZ - ellipsoid zone. ERM - epiretinal membrane. ORA - outer retinal atrophy. ORT - outer retinal tubulation. SRHM - subretinal hyper-reflective material         Intravitreal Injection, Pharmacologic Agent - OD - Right Eye       Time Out 11/16/2017. 2:42 PM. Confirmed correct patient, procedure, site, and patient consented.   Anesthesia Topical anesthesia was used. Anesthetic medications included Lidocaine 2%, Proparacaine 0.5%.   Procedure Preparation included 5% betadine to ocular surface, eyelid speculum. A supplied needle was used.   Injection:  1.25 mg Bevacizumab 1.25mg /0.110ml   NDC: 16109-604-54, Lot: [id number] , Expiration date: 12/17/2017   Route: Intravitreal, Site: Right Eye, Waste: 0 mL  Post-op Post injection exam found visual acuity of at least counting fingers. The patient tolerated the procedure well. There were no complications. The patient received written and verbal post procedure care education.                 ASSESSMENT/PLAN:    ICD-10-CM   1. Severe nonproliferative diabetic retinopathy of both eyes with macular edema associated with type 2 diabetes mellitus (HCC) W23.7628 Intravitreal Injection, Pharmacologic Agent - OD - Right Eye    Bevacizumab (AVASTIN) SOLN 1.25 mg  2. Retinal edema H35.81 OCT, Retina - OU - Both Eyes  3. Essential hypertension I10   4. Hypertensive retinopathy of both eyes H35.033   5. 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 Severe Non-proliferative diabetic retinopathy, OU - exam with extensive 360 IRH, CWS and venous beading OU - initial FA shows late leaking MAs OU, No NV - OCT shows diabetic macular edema, OU (OD > OS, OS non-central) - S/P IVA OD #1 (07.18.19), #2 (08.14.19), #3 (09.11.19), #4 (10.14.19) - S/P focal laser OS (09.25.19) - OCT shows interval decrease in DME OD; OS with  persistent superior DME - BCVA 20/40-1 OD, 20/30 OS - recommend  IVA OD #5 today (11.11.19) - pt wishes to proceed - RBA of procedure discussed, questions answered - informed consent obtained and signed - see procedure note - f/u in 4-6 wks repeat DFE,OCT, repeat FA transit OD and possible injection vs focal laser OD  3,4. Hypertensive retinopathy OU - discussed importance of tight BP control - monitor  5. Combined form age related cataract OU-  - The symptoms of cataract, surgical options, and treatments and risks were discussed with patient. - discussed diagnosis and progression - not yet visually significant - monitor for now   Ophthalmic Meds Ordered this visit:  Meds ordered this encounter  Medications  . Bevacizumab (AVASTIN) SOLN 1.25 mg       Return for 4-6 wks F/U NPDR OU, DFE, OCT.  There are no Patient Instructions on file for this visit.  This document serves as a record of services personally performed by Karie Chimera, MD, PhD. It was created on their behalf by Laurian Brim, OA, an ophthalmic assistant. The creation of this record is the provider's dictation and/or activities during the visit.    Electronically signed by: Laurian Brim, OA  11.08.19 2:45 PM     Karie Chimera, M.D., Ph.D. Diseases & Surgery of the Retina and Vitreous [facility]   I have reviewed the above documentation for accuracy and completeness, and I agree with the above. Karie Chimera, M.D., Ph.D. 11/16/17 2:45 PM     Abbreviations: M myopia (nearsighted); A astigmatism; H hyperopia (farsighted); P presbyopia; Mrx spectacle prescription;  CTL contact lenses; OD right eye; OS left eye; OU both eyes  XT exotropia; ET esotropia; PEK punctate epithelial keratitis; PEE punctate epithelial erosions; DES dry eye syndrome; MGD meibomian gland dysfunction; ATs artificial tears; PFAT's preservative free artificial tears; NSC nuclear sclerotic cataract; PSC  posterior subcapsular cataract; ERM epi-retinal membrane; PVD posterior vitreous detachment; RD retinal detachment; DM diabetes mellitus; DR diabetic retinopathy; NPDR non-proliferative diabetic retinopathy; PDR proliferative diabetic retinopathy; CSME clinically significant macular edema; DME diabetic macular edema; dbh dot blot hemorrhages; CWS cotton wool spot; POAG primary open angle glaucoma; C/D cup-to-disc ratio; HVF humphrey visual field; GVF goldmann visual field; OCT optical coherence tomography; IOP intraocular pressure; BRVO Branch retinal vein occlusion; CRVO central retinal vein occlusion; CRAO central retinal artery occlusion; BRAO branch retinal artery occlusion; RT retinal tear; SB scleral buckle; PPV pars plana vitrectomy; VH Vitreous hemorrhage; PRP panretinal laser photocoagulation; IVK intravitreal kenalog; VMT vitreomacular traction; MH Macular hole;  NVD neovascularization of the disc; NVE neovascularization elsewhere; AREDS age related eye disease study; ARMD age related macular degeneration; POAG primary open angle glaucoma; EBMD epithelial/anterior basement membrane dystrophy; ACIOL anterior chamber intraocular lens; IOL intraocular lens; PCIOL posterior chamber intraocular lens; Phaco/IOL phacoemulsification with intraocular lens placement; PRK photorefractive keratectomy; LASIK laser assisted in situ keratomileusis; HTN hypertension; DM diabetes mellitus; COPD chronic obstructive pulmonary disease

## 2017-11-16 ENCOUNTER — Encounter (INDEPENDENT_AMBULATORY_CARE_PROVIDER_SITE_OTHER): Payer: Self-pay | Admitting: Ophthalmology

## 2017-11-16 ENCOUNTER — Ambulatory Visit (INDEPENDENT_AMBULATORY_CARE_PROVIDER_SITE_OTHER): Payer: Medicaid Other | Admitting: Ophthalmology

## 2017-11-16 DIAGNOSIS — H3581 Retinal edema: Secondary | ICD-10-CM | POA: Diagnosis not present

## 2017-11-16 DIAGNOSIS — H35033 Hypertensive retinopathy, bilateral: Secondary | ICD-10-CM

## 2017-11-16 DIAGNOSIS — E113413 Type 2 diabetes mellitus with severe nonproliferative diabetic retinopathy with macular edema, bilateral: Secondary | ICD-10-CM | POA: Diagnosis not present

## 2017-11-16 DIAGNOSIS — I10 Essential (primary) hypertension: Secondary | ICD-10-CM

## 2017-11-16 DIAGNOSIS — H25813 Combined forms of age-related cataract, bilateral: Secondary | ICD-10-CM

## 2017-11-16 MED ORDER — BEVACIZUMAB CHEMO INJECTION 1.25MG/0.05ML SYRINGE FOR KALEIDOSCOPE
1.2500 mg | INTRAVITREAL | Status: AC
  Administered 2017-11-16: 1.25 mg via INTRAVITREAL

## 2017-11-18 ENCOUNTER — Ambulatory Visit (INDEPENDENT_AMBULATORY_CARE_PROVIDER_SITE_OTHER): Payer: Medicaid Other

## 2017-11-18 ENCOUNTER — Ambulatory Visit (HOSPITAL_COMMUNITY)
Admission: EM | Admit: 2017-11-18 | Discharge: 2017-11-18 | Disposition: A | Discharge status: Home / Self Care | Payer: Medicaid Other | Attending: Family Medicine | Admitting: Family Medicine

## 2017-11-18 ENCOUNTER — Encounter (HOSPITAL_COMMUNITY): Payer: Self-pay | Admitting: Emergency Medicine

## 2017-11-18 DIAGNOSIS — B001 Herpesviral vesicular dermatitis: Secondary | ICD-10-CM

## 2017-11-18 DIAGNOSIS — J181 Lobar pneumonia, unspecified organism: Secondary | ICD-10-CM | POA: Diagnosis not present

## 2017-11-18 DIAGNOSIS — J189 Pneumonia, unspecified organism: Secondary | ICD-10-CM

## 2017-11-18 MED ORDER — VALACYCLOVIR HCL 1 G PO TABS: 2.0000 g | ORAL_TABLET | Freq: Two times a day (BID) | ORAL | 0 refills | Status: AC

## 2017-11-18 MED ORDER — BENZONATATE 100 MG PO CAPS: 100.0000 mg | ORAL_CAPSULE | Freq: Three times a day (TID) | ORAL | 0 refills | Status: DC

## 2017-11-18 MED ORDER — AZITHROMYCIN 250 MG PO TABS: 250.0000 mg | ORAL_TABLET | Freq: Every day | ORAL | 0 refills | Status: DC

## 2017-11-18 NOTE — ED Triage Notes (Signed)
Pt c/o cough, cold chills x2 weeks.

## 2017-11-18 NOTE — ED Provider Notes (Addendum)
[facility]   [id number] 11/18/17 Arrival Time: 4098  Cc: COUGH and mouth sore  SUBJECTIVE:  Sheila Gibson is a 47 y.o. female who presents with cough  x 2 weeks.  Denies positive sick exposure or precipitating event.  Describes cough as constant, worsening, and productive with green/ "muddy" sputum.  Has tried dayquil with minimal relief.  Denies aggravating factors.  Reports previous symptoms in the past. Complains of chills, fatigue, decreased appetite, SOB, and wheezing. Denies fever, ,sinus pain, rhinorrhea, sore throat, chest pain, nausea, changes in bowel or bladder habits.    Patient also mentions painful sore on lip that occurred 2 days ago.  Hx significant for herpes.  Has tried OTC medications without relief.  Worse to the touch.    ROS: As per HPI.  Past Medical History:  Diagnosis Date  . COPD (chronic obstructive pulmonary disease) (HCC)   . Depression   . Diabetes mellitus without complication (HCC)   . Heart murmur   . Hepatic cirrhosis (HCC)   . Hypertension   . Obesity   . Thyroid disease    Past Surgical History:  Procedure Laterality Date  . CESAREAN SECTION    . ESOPHAGOGASTRODUODENOSCOPY (EGD) WITH PROPOFOL N/A 09/01/2017   Procedure: ESOPHAGOGASTRODUODENOSCOPY (EGD) WITH PROPOFOL;  Surgeon: Kathi Der, MD;  Location: [facility];  Service: Gastroenterology;  Laterality: N/A;  . THYROIDECTOMY     No Known Allergies Current Facility-Administered Medications on File Prior to Encounter  Medication Dose Route Frequency Provider Last Rate Last Dose  . Bevacizumab (AVASTIN) SOLN 1.25 mg  1.25 mg Intravitreal  Rennis Chris, MD   1.25 mg at 07/23/17 0033  . Bevacizumab (AVASTIN) SOLN 1.25 mg  1.25 mg Intravitreal  Rennis Chris, MD   1.25 mg at 08/21/17 0031  . Bevacizumab (AVASTIN) SOLN 1.25 mg  1.25 mg Intravitreal  Rennis Chris, MD   1.25 mg at 09/16/17 1630  . Bevacizumab (AVASTIN) SOLN 1.25 mg  1.25 mg Intravitreal  Rennis Chris, MD   1.25 mg at 10/19/17 1738  . Bevacizumab (AVASTIN) SOLN 1.25 mg  1.25 mg Intravitreal  Rennis Chris, MD   1.25 mg at 11/16/17 1442   Current Outpatient Medications on File Prior to Encounter  Medication Sig Dispense Refill  . acetaminophen (TYLENOL) 500 MG tablet Take 500 mg by mouth daily as needed for moderate pain.    Marland Kitchen albuterol (PROVENTIL HFA;VENTOLIN HFA) 108 (90 BASE) MCG/ACT inhaler Inhale 1-2 puffs into the lungs every 6 (six) hours as needed for wheezing or shortness of breath.    Marland Kitchen amLODipine (NORVASC) 10 MG tablet Take 1 tablet (10 mg total) by mouth daily. 30 tablet 0  . amLODipine (NORVASC) 5 MG tablet Take 5 mg by mouth daily.  2  . gabapentin (NEURONTIN) 800 MG tablet Take 800 mg by mouth 3 (three) times daily.     . hydrOXYzine (ATARAX/VISTARIL) 25 MG tablet Take 25 mg by mouth 3 (three) times daily as needed. for anxiety  0  . ibuprofen (ADVIL,MOTRIN) 800 MG tablet Take 800 mg by mouth every 8 (eight) hours as needed for moderate pain.    Marland Kitchen insulin aspart protamine- aspart (NOVOLOG MIX 70/30) (70-30) 100 UNIT/ML injection Inject 6 Units into the skin 2 (two) times daily with a meal.    . insulin glargine (LANTUS) 100 unit/mL SOPN Inject 60 Units into the skin 2 (two) times daily.     Marland Kitchen levothyroxine (SYNTHROID, LEVOTHROID) 50 MCG tablet Take 50 mcg by mouth daily.  3  . losartan (COZAAR) 50 MG tablet Take 1 tablet (50 mg total) by mouth daily. 30 tablet 0  . ondansetron (ZOFRAN ODT) 4 MG disintegrating tablet 4mg  ODT q4 hours prn nausea/vomit 10 tablet 0  . ondansetron (ZOFRAN) 4 MG tablet Take 4 mg by mouth daily as needed for nausea or vomiting.    Marland Kitchen. tenofovir (VIREAD) 300 MG tablet Take 300 mg by mouth daily.  1  . tiZANidine (ZANAFLEX) 4 MG tablet Take 4 mg by mouth 3 (three) times daily as needed for muscle spasms.     Marland Kitchen. triamcinolone cream (KENALOG) 0.1 % Apply 1 application topically 3 (three) times daily.  0  . venlafaxine XR (EFFEXOR-XR) 37.5 MG 24 hr  capsule Take 37.5 mg by mouth daily.  3  . zolpidem (AMBIEN) 10 MG tablet Take 10 mg by mouth at bedtime as needed for sleep.   0  . ZOVIRAX 5 % Apply 1 application topically 3 (three) times daily.   0    Social History   Socioeconomic History  . Marital status: Single    Spouse name: Not on file  . Number of children: Not on file  . Years of education: Not on file  . Highest education level: Not on file  Occupational History  . Not on file  Social Needs  . Financial resource strain: Not on file  . Food insecurity:    Worry: Not on file    Inability: Not on file  . Transportation needs:    Medical: Not on file    Non-medical: Not on file  Tobacco Use  . Smoking status: Never Smoker  . Smokeless tobacco: Never Used  Substance and Sexual Activity  . Alcohol use: Not Currently  . Drug use: No  . Sexual activity: Not Currently    Birth control/protection: None  Lifestyle  . Physical activity:    Days per week: Not on file    Minutes per session: Not on file  . Stress: Not on file  Relationships  . Social connections:    Talks on phone: Not on file    Gets together: Not on file    Attends religious service: Not on file    Active member of club or organization: Not on file    Attends meetings of clubs or organizations: Not on file    Relationship status: Not on file  . Intimate partner violence:    Fear of current or ex partner: Not on file    Emotionally abused: Not on file    Physically abused: Not on file    Forced sexual activity: Not on file  Other Topics Concern  . Not on file  Social History Narrative  . Not on file   Family History  Problem Relation Age of Onset  . Cancer Mother   . Hypertension Other      OBJECTIVE:  Vitals:   11/18/17 1012  BP: (!) 150/75  Pulse: 99  Resp: 20  Temp: 100.1 F (37.8 C)  SpO2: 100%     General appearance: Alert, appears fatigued, but nontoxic; speaking in full sentences without difficulty HEENT:NCAT; Ears:  EACs clear, TMs pearly gray, scarring present on left TM; Eyes: PERRL.  EOM grossly intact. Nose: nares erythematous with clear rhinorrhea; Throat: tonsils nonerythematous or enlarged, uvula midline; Lip: erythematous lesion with mild crusting localized to bottom lip, mildly tender to palpation without active drainage (see picture below) Neck: supple without LAD Lungs: Harsh breath sounds heard at bilateral bases  of lungs; normal respiratory effort; mild cough present Heart: Systolic murmur present.  Radial pulses 2+ symmetrical bilaterally Skin: warm and dry Psychological: alert and cooperative; normal mood and affect      DIAGNOSTIC STUDIES:  Dg Chest 2 View  Result Date: 11/18/2017 CLINICAL DATA:  Cough and congestion for 2 weeks EXAM: CHEST - 2 VIEW COMPARISON:  09/10/2017 FINDINGS: Cardiac shadow is within normal limits. The lungs are well aerated bilaterally. Patchy infiltrate is noted in the left mid and lower lung projecting primarily within the left upper and left lower lobes. No sizable effusion is seen. No bony abnormality is noted. IMPRESSION: Multifocal pneumonia within the left lung. Electronically Signed   By: Alcide Clever M.D.   On: 11/18/2017 11:11     ASSESSMENT & PLAN:  1. Pneumonia of left lower lobe due to infectious organism (HCC)   2. Herpes labialis     Meds ordered this encounter  Medications  . valACYclovir (VALTREX) 1000 MG tablet    Sig: Take 2 tablets (2,000 mg total) by mouth 2 (two) times daily for 1 day. For breakouts    Dispense:  4 tablet    Refill:  0    Order Specific Question:   Supervising Provider    Answer:   Isa Rankin [[id number]]  . azithromycin (ZITHROMAX) 250 MG tablet    Sig: Take 1 tablet (250 mg total) by mouth daily. Take first 2 tablets together, then 1 every day until finished.    Dispense:  6 tablet    Refill:  0    Order Specific Question:   Supervising Provider    Answer:   Isa Rankin [phone]  . benzonatate  (TESSALON) 100 MG capsule    Sig: Take 1 capsule (100 mg total) by mouth every 8 (eight) hours.    Dispense:  21 capsule    Refill:  0    Order Specific Question:   Supervising Provider    Answer:   Isa Rankin [[id number]]    Orders Placed This Encounter  Procedures  . DG Chest 2 View    Standing Status:   Standing    Number of Occurrences:   1    Order Specific Question:   Reason for Exam (SYMPTOM  OR DIAGNOSIS REQUIRED)    Answer:   worsening cough x 2 weeks w/ hx of COPD     Get plenty of rest and push fluids Use inhaler as needed for shortness of breath and/or wheezing Take antibiotic as directed and to completion Prescribed tessolone perles as needed for cough Use OTC medication as needed for symptomatic relief Follow up with PCP or with [facility] for recheck next week for reevaluation  Return or go to ER if you have any new or worsening symptoms such as fever, chills, fatigue, shortness of breath, wheezing, chest pain, nausea, changes in bowel or bladder habits, etc...  Recommend repeat chest x-ray in 6-8 weeks  Will treated for herpes of the lip today.  Take medication as prescribed and to completion.    Reviewed expectations re: course of current medical issues. Questions answered. Outlined signs and symptoms indicating need for more acute intervention. Patient verbalized understanding. After Visit Summary given.          Rennis Harding, PA-C 11/18/17 1133    Alvino Chapel [place], PA-C 11/18/17 1410

## 2017-11-18 NOTE — Discharge Instructions (Addendum)
Get plenty of rest and push fluids Use inhaler as needed for shortness of breath and/or wheezing Take antibiotic as directed and to completion Prescribed tessolone perles as needed for cough Use OTC medication as needed for symptomatic relief [facility] week for reevaluation  Return or go to ER if you have any new or worsening symptoms such as fever, chills, fatigue, shortness of breath, wheezing, chest pain, nausea, changes in bowel or bladder habits, etc...  Recommend repeat chest x-ray in 6-8 weeks  Will treated for herpes of the lip today.  Take medication as prescribed and to completion.

## 2017-11-24 ENCOUNTER — Ambulatory Visit: Payer: Medicaid Other | Admitting: Obstetrics and Gynecology

## 2017-12-11 NOTE — Progress Notes (Signed)
[facility] - Clinic Note  12/14/2017     CHIEF COMPLAINT Patient presents for Retina Follow Up   HISTORY OF PRESENT ILLNESS: Sheila Gibson is a 47 y.o. female who presents to the clinic today for:   HPI    Retina Follow Up    Patient presents with  Diabetic Retinopathy.  In both eyes.  Severity is moderate.  Duration of 4 weeks.  I, the attending physician,  performed the HPI with the patient and updated documentation appropriately.          Comments    Patient states vision improved OD. Vision seems about the same OS. BS was 104 this am. Last A1c doesn't remember.        Last edited by Rennis Chris, MD on 12/14/2017 11:38 PM. (History)    pt states she feels like she did better on her eye test than she thought she was going to, states she can see a lot better out of her right eye, she states she has not been straining her right eye, she has not been reading in the dark or sitting too close to the TV  Referring physician: Aura Camps, MD [address] [place], [place] [address]  HISTORICAL INFORMATION:   Selected notes from the MEDICAL RECORD NUMBER Referred by Dr. Aura Camps for concern of PDR and CSME OU LEE: 06.24.19 Kirk Ruths) [BCVA: OD: 20/60-2 OS: 20/25] Ocular Hx-DME OU, PDR OU, cataract OU PMH-DM (taking Lantus and Novolog), Thyroid disease, HTN    CURRENT MEDICATIONS: No current outpatient medications on file. (Ophthalmic Drugs)   No current facility-administered medications for this visit.  (Ophthalmic Drugs)   Current Outpatient Medications (Other)  Medication Sig  . acetaminophen (TYLENOL) 500 MG tablet Take 500 mg by mouth daily as needed for moderate pain.  Marland Kitchen albuterol (PROVENTIL HFA;VENTOLIN HFA) 108 (90 BASE) MCG/ACT inhaler Inhale 1-2 puffs into the lungs every 6 (six) hours as needed for wheezing or shortness of breath.  Marland Kitchen amLODipine (NORVASC) 5 MG tablet Take 5 mg by mouth daily.  Marland Kitchen  azithromycin (ZITHROMAX) 250 MG tablet Take 1 tablet (250 mg total) by mouth daily. Take first 2 tablets together, then 1 every day until finished.  . benzonatate (TESSALON) 100 MG capsule Take 1 capsule (100 mg total) by mouth every 8 (eight) hours.  . gabapentin (NEURONTIN) 800 MG tablet Take 800 mg by mouth 3 (three) times daily.   . hydrOXYzine (ATARAX/VISTARIL) 25 MG tablet Take 25 mg by mouth 3 (three) times daily as needed. for anxiety  . ibuprofen (ADVIL,MOTRIN) 800 MG tablet Take 800 mg by mouth every 8 (eight) hours as needed for moderate pain.  Marland Kitchen insulin aspart protamine- aspart (NOVOLOG MIX 70/30) (70-30) 100 UNIT/ML injection Inject 6 Units into the skin 2 (two) times daily with a meal.  . insulin glargine (LANTUS) 100 unit/mL SOPN Inject 60 Units into the skin 2 (two) times daily.   Marland Kitchen levothyroxine (SYNTHROID, LEVOTHROID) 50 MCG tablet Take 50 mcg by mouth daily.   Marland Kitchen losartan (COZAAR) 25 MG tablet Take 25 mg by mouth daily.   . ondansetron (ZOFRAN ODT) 4 MG disintegrating tablet 4mg  ODT q4 hours prn nausea/vomit  . ondansetron (ZOFRAN) 4 MG tablet Take 4 mg by mouth daily as needed for nausea or vomiting.  Marland Kitchen tenofovir (VIREAD) 300 MG tablet Take 300 mg by mouth daily.  Marland Kitchen tiZANidine (ZANAFLEX) 4 MG tablet Take 4 mg by mouth 3 (three) times daily  as needed for muscle spasms.   Marland Kitchen triamcinolone cream (KENALOG) 0.1 % Apply 1 application topically 3 (three) times daily.  Marland Kitchen venlafaxine XR (EFFEXOR-XR) 37.5 MG 24 hr capsule Take 37.5 mg by mouth daily.  Marland Kitchen zolpidem (AMBIEN) 10 MG tablet Take 10 mg by mouth at bedtime as needed for sleep.   Marland Kitchen ZOVIRAX 5 % Apply 1 application topically 3 (three) times daily.   Marland Kitchen amLODipine (NORVASC) 10 MG tablet Take 1 tablet (10 mg total) by mouth daily.  Marland Kitchen losartan (COZAAR) 50 MG tablet Take 1 tablet (50 mg total) by mouth daily.   Current Facility-Administered Medications (Other)  Medication Route  . Bevacizumab (AVASTIN) SOLN 1.25 mg Intravitreal  .  Bevacizumab (AVASTIN) SOLN 1.25 mg Intravitreal  . Bevacizumab (AVASTIN) SOLN 1.25 mg Intravitreal  . Bevacizumab (AVASTIN) SOLN 1.25 mg Intravitreal  . Bevacizumab (AVASTIN) SOLN 1.25 mg Intravitreal  . Bevacizumab (AVASTIN) SOLN 1.25 mg Intravitreal  . Bevacizumab (AVASTIN) SOLN 1.25 mg Intravitreal      REVIEW OF SYSTEMS: ROS    Positive for: Endocrine, Eyes   Negative for: Constitutional, Gastrointestinal, Neurological, Skin, Genitourinary, Musculoskeletal, HENT, Cardiovascular, Respiratory, Psychiatric, Allergic/Imm, Heme/Lymph   Last edited by Annalee Genta D on 12/14/2017  2:10 PM. (History)       ALLERGIES No Known Allergies  PAST MEDICAL HISTORY Past Medical History:  Diagnosis Date  . COPD (chronic obstructive pulmonary disease) (HCC)   . Depression   . Diabetes mellitus without complication (HCC)   . Heart murmur   . Hepatic cirrhosis (HCC)   . Hypertension   . Obesity   . Thyroid disease    Past Surgical History:  Procedure Laterality Date  . CESAREAN SECTION    . ESOPHAGOGASTRODUODENOSCOPY (EGD) WITH PROPOFOL N/A 09/01/2017   Procedure: ESOPHAGOGASTRODUODENOSCOPY (EGD) WITH PROPOFOL;  Surgeon: Kathi Der, MD;  Location: [facility];  Service: Gastroenterology;  Laterality: N/A;  . THYROIDECTOMY      FAMILY HISTORY Family History  Problem Relation Age of Onset  . Cancer Mother   . Hypertension Other     SOCIAL HISTORY Social History   Tobacco Use  . Smoking status: Never Smoker  . Smokeless tobacco: Never Used  Substance Use Topics  . Alcohol use: Not Currently  . Drug use: No         OPHTHALMIC EXAM:  Base Eye Exam    Visual Acuity (Snellen - Linear)      Right Left   Dist [place] 20/100 +2 20/60   Dist ph [place] 20/50 +2 20/30       Tonometry (Tonopen, 2:21 PM)      Right Left   Pressure 16 10       Pupils      Dark Light Shape React APD   Right 4 3 Round Brisk None   Left 4 3 Round Brisk None       Visual Fields (Counting  fingers)      Left Right    Full Full       Extraocular Movement      Right Left    Full, Ortho Full, Ortho       Neuro/Psych    Oriented x3:  Yes   Mood/Affect:  Normal       Dilation    Both eyes:  1.0% Mydriacyl, 2.5% Phenylephrine @ 2:21 PM        Slit Lamp and Fundus Exam    Slit Lamp Exam      Right Left  Lids/Lashes Dermatochalasis - upper lid Dermatochalasis - upper lid   Conjunctiva/Sclera Melanosis Melanosis   Cornea Arcus, 2+ diffuse Punctate epithelial erosions Arcus, 2+ diffuse Punctate epithelial erosions   Anterior Chamber Deep and quiet Deep and quiet   Iris Round and dilated Round and dilated   Lens 1+ Nuclear sclerosis, 2+ Cortical cataract 1+ Nuclear sclerosis, 2+ Cortical cataract   Vitreous Trace Vitreous syneresis Trace Vitreous syneresis       Fundus Exam      Right Left   Disc Pink and Sharp Pink and Sharp   C/D Ratio 0.4 0.5   Macula Blunted foveal reflex, interval improvement in central edema/IRF/SRF, parafoveal exudates inferior to macula, Intraretinal hemorrhage and CWS SN to fovea shrinking Good foveal reflex, cluster of Microaneurysms superior to fovea with +edema -- persistent, scattered Intraretinal hemorrhage, scattered DBH, mild focal laser scars superior to macula   Vessels Dilated and Tortuous veins, +beading -- slight improvement, Copper wiring Dilated and Tortuous veins, Copper wiring   Periphery Attached, +IRH, +CWS -- scattered surrounding disc, scattered posterior DBH Attached, 360 IRH and CWS superiorly and around nerve, Cotton wool spots and DBH nasal to fovea          IMAGING AND PROCEDURES  Imaging and Procedures for @TODAY @  OCT, Retina - OU - Both Eyes       Right Eye Quality was good. Central Foveal Thickness: 209. Progression has improved. Findings include intraretinal fluid, no SRF, outer retinal atrophy, normal foveal contour (interval improvement in IRF).   Left Eye Quality was good. Central Foveal Thickness:  254. Progression has been stable. Findings include normal foveal contour, intraretinal fluid, no SRF (persistent IRF superior to fovea).   Notes *Images captured and stored on drive  Diagnosis / Impression:  OD: central DME improved -- interval improvement in IRF OS: +DME superior to fovea -- persistent IRF superior to fovea  Clinical management:  See below  Abbreviations: NFP - Normal foveal profile. CME - cystoid macular edema. PED - pigment epithelial detachment. IRF - intraretinal fluid. SRF - subretinal fluid. EZ - ellipsoid zone. ERM - epiretinal membrane. ORA - outer retinal atrophy. ORT - outer retinal tubulation. SRHM - subretinal hyper-reflective material         Fluorescein Angiography Optos (Transit OD)       Right Eye   Progression has improved. Early phase findings include microaneurysm, vascular perfusion defect (Very mild vascular perfusion defect). Mid/Late phase findings include microaneurysm, leakage, vascular perfusion defect (Interval improvement in leakage).   Left Eye   Progression has improved. Early phase findings include microaneurysm. Mid/Late phase findings include leakage, microaneurysm (Interval improvement in leakage).   Notes *Images captured and stored on drive;   Impression: Late leaking MAs OU -- interval improvement No NV Severe NPDR          Intravitreal Injection, Pharmacologic Agent - OD - Right Eye       Time Out 12/14/2017. 4:30 PM. Confirmed correct patient, procedure, site, and patient consented.   Anesthesia Topical anesthesia was used. Anesthetic medications included Lidocaine 2%, Proparacaine 0.5%.   Procedure Preparation included 5% betadine to ocular surface, eyelid speculum. A supplied needle was used.   Injection:  1.25 mg Bevacizumab 1.25mg /0.81ml   NDC: 50242-060-01, Lot: [id number] , Expiration date: 01/20/2018   Route: Intravitreal, Site: Right Eye, Waste: 0 mL  Post-op Post injection exam found  visual acuity of at least counting fingers. The patient tolerated the procedure well. There were no complications. The patient received written  and verbal post procedure care education.        Intravitreal Injection, Pharmacologic Agent - OS - Left Eye       Time Out 12/14/2017. 4:30 PM. Confirmed correct patient, procedure, site, and patient consented.   Anesthesia Topical anesthesia was used. Anesthetic medications included Lidocaine 2%, Proparacaine 0.5%.   Procedure Preparation included 5% betadine to ocular surface, eyelid speculum. A 30 gauge needle was used.   Injection:  1.25 mg Bevacizumab 1.25mg /0.7005ml   NDC: 16109-604-5450242-060-01, Lot: [id number] , Expiration date: 01/30/2018   Route: Intravitreal, Site: Left Eye, Waste: 0 mL  Post-op Post injection exam found visual acuity of at least counting fingers. The patient tolerated the procedure well. There were no complications. The patient received written and verbal post procedure care education.                 ASSESSMENT/PLAN:    ICD-10-CM   1. Severe nonproliferative diabetic retinopathy of both eyes with macular edema associated with type 2 diabetes mellitus (HCC) B76.2831E11.3413 Fluorescein Angiography Optos (Transit OD)    Intravitreal Injection, Pharmacologic Agent - OD - Right Eye    Intravitreal Injection, Pharmacologic Agent - OS - Left Eye    Bevacizumab (AVASTIN) SOLN 1.25 mg    Bevacizumab (AVASTIN) SOLN 1.25 mg  2. Retinal edema H35.81 OCT, Retina - OU - Both Eyes  3. Essential hypertension I10   4. Hypertensive retinopathy of both eyes H35.033 Fluorescein Angiography Optos (Transit OD)  5. 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 Severe Non-proliferative diabetic retinopathy, OU - exam with extensive 360 IRH, CWS and venous beading OU - initial FA shows late leaking MAs OU, No NV - OCT shows diabetic macular edema, OU (OD > OS, OS non-central) - S/P IVA OD #1 (07.18.19), #2  (08.14.19), #3 (09.11.19), #4 (10.14.19), #5 (11.11.19) - S/P focal laser OS (09.25.19) - OCT shows interval decrease in DME OD; OS with persistent superior DME - BCVA 20/50+2 OD, 20/30 OS - recommend IVA OU today 12.09.19, OD #6 OS, #1  - pt wishes to proceed - RBA of procedure discussed, questions answered - informed consent obtained and signed - see procedure note - f/u in 4 weeks, possible focal laser either eye  3,4. Hypertensive retinopathy OU - discussed importance of tight BP control - monitor  5. Combined form age related cataract OU-  - The symptoms of cataract, surgical options, and treatments and risks were discussed with patient. - discussed diagnosis and progression - not yet visually significant - monitor for now   Ophthalmic Meds Ordered this visit:  Meds ordered this encounter  Medications  . Bevacizumab (AVASTIN) SOLN 1.25 mg  . Bevacizumab (AVASTIN) SOLN 1.25 mg       Return in about 4 weeks (around 01/11/2018) for F/U NPDR OU, DFE, OCT, possible laser.  There are no Patient Instructions on file for this visit.  This document serves as a record of services personally performed by Karie ChimeraBrian G. Bosco Paparella, MD, PhD. It was created on their behalf by Laurian BrimAmanda Brown, OA, an ophthalmic assistant. The creation of this record is the provider's dictation and/or activities during the visit.    Electronically signed by: Laurian BrimAmanda Brown, OA  12.06.19 11:40 PM    Karie ChimeraBrian G. Shelie Lansing, M.D., Ph.D. Diseases & Surgery of the Retina and Vitreous [facility]  I have reviewed the above documentation for accuracy and completeness, and I agree with the above. Karie ChimeraBrian G. Sreekar Broyhill, M.D.,  Ph.D. 12/14/17 11:41 PM    Abbreviations: M myopia (nearsighted); A astigmatism; H hyperopia (farsighted); P presbyopia; Mrx spectacle prescription;  CTL contact lenses; OD right eye; OS left eye; OU both eyes  XT exotropia; ET esotropia; PEK punctate epithelial keratitis; PEE punctate  epithelial erosions; DES dry eye syndrome; MGD meibomian gland dysfunction; ATs artificial tears; PFAT's preservative free artificial tears; NSC nuclear sclerotic cataract; PSC posterior subcapsular cataract; ERM epi-retinal membrane; PVD posterior vitreous detachment; RD retinal detachment; DM diabetes mellitus; DR diabetic retinopathy; NPDR non-proliferative diabetic retinopathy; PDR proliferative diabetic retinopathy; CSME clinically significant macular edema; DME diabetic macular edema; dbh dot blot hemorrhages; CWS cotton wool spot; POAG primary open angle glaucoma; C/D cup-to-disc ratio; HVF humphrey visual field; GVF goldmann visual field; OCT optical coherence tomography; IOP intraocular pressure; BRVO Branch retinal vein occlusion; CRVO central retinal vein occlusion; CRAO central retinal artery occlusion; BRAO branch retinal artery occlusion; RT retinal tear; SB scleral buckle; PPV pars plana vitrectomy; VH Vitreous hemorrhage; PRP panretinal laser photocoagulation; IVK intravitreal kenalog; VMT vitreomacular traction; MH Macular hole;  NVD neovascularization of the disc; NVE neovascularization elsewhere; AREDS age related eye disease study; ARMD age related macular degeneration; POAG primary open angle glaucoma; EBMD epithelial/anterior basement membrane dystrophy; ACIOL anterior chamber intraocular lens; IOL intraocular lens; PCIOL posterior chamber intraocular lens; Phaco/IOL phacoemulsification with intraocular lens placement; PRK photorefractive keratectomy; LASIK laser assisted in situ keratomileusis; HTN hypertension; DM diabetes mellitus; COPD chronic obstructive pulmonary disease

## 2017-12-14 ENCOUNTER — Ambulatory Visit (INDEPENDENT_AMBULATORY_CARE_PROVIDER_SITE_OTHER): Payer: Medicaid Other | Admitting: Ophthalmology

## 2017-12-14 ENCOUNTER — Encounter (INDEPENDENT_AMBULATORY_CARE_PROVIDER_SITE_OTHER): Payer: Self-pay | Admitting: Ophthalmology

## 2017-12-14 DIAGNOSIS — H35033 Hypertensive retinopathy, bilateral: Secondary | ICD-10-CM

## 2017-12-14 DIAGNOSIS — I10 Essential (primary) hypertension: Secondary | ICD-10-CM | POA: Diagnosis not present

## 2017-12-14 DIAGNOSIS — E113413 Type 2 diabetes mellitus with severe nonproliferative diabetic retinopathy with macular edema, bilateral: Secondary | ICD-10-CM

## 2017-12-14 DIAGNOSIS — H3581 Retinal edema: Secondary | ICD-10-CM | POA: Diagnosis not present

## 2017-12-14 DIAGNOSIS — H25813 Combined forms of age-related cataract, bilateral: Secondary | ICD-10-CM

## 2017-12-14 MED ORDER — BEVACIZUMAB CHEMO INJECTION 1.25MG/0.05ML SYRINGE FOR KALEIDOSCOPE
1.2500 mg | INTRAVITREAL | Status: AC
  Administered 2017-12-14: 1.25 mg via INTRAVITREAL

## 2017-12-17 ENCOUNTER — Telehealth (INDEPENDENT_AMBULATORY_CARE_PROVIDER_SITE_OTHER): Payer: Self-pay

## 2017-12-23 ENCOUNTER — Ambulatory Visit: Payer: Medicaid Other | Admitting: Obstetrics and Gynecology

## 2017-12-29 ENCOUNTER — Other Ambulatory Visit: Payer: Self-pay

## 2017-12-29 ENCOUNTER — Encounter (HOSPITAL_COMMUNITY): Payer: Self-pay | Admitting: Emergency Medicine

## 2017-12-29 ENCOUNTER — Emergency Department (HOSPITAL_COMMUNITY)
Admission: EM | Admit: 2017-12-29 | Discharge: 2017-12-29 | Disposition: A | Discharge status: Home / Self Care | Payer: Medicaid Other | Attending: Emergency Medicine | Admitting: Emergency Medicine

## 2017-12-29 ENCOUNTER — Emergency Department (HOSPITAL_COMMUNITY): Payer: Medicaid Other

## 2017-12-29 DIAGNOSIS — Z794 Long term (current) use of insulin: Secondary | ICD-10-CM | POA: Diagnosis not present

## 2017-12-29 DIAGNOSIS — R0989 Other specified symptoms and signs involving the circulatory and respiratory systems: Secondary | ICD-10-CM

## 2017-12-29 DIAGNOSIS — K228 Other specified diseases of esophagus: Secondary | ICD-10-CM

## 2017-12-29 DIAGNOSIS — Z79899 Other long term (current) drug therapy: Secondary | ICD-10-CM | POA: Diagnosis not present

## 2017-12-29 DIAGNOSIS — I10 Essential (primary) hypertension: Secondary | ICD-10-CM

## 2017-12-29 DIAGNOSIS — E119 Type 2 diabetes mellitus without complications: Secondary | ICD-10-CM | POA: Insufficient documentation

## 2017-12-29 DIAGNOSIS — R070 Pain in throat: Secondary | ICD-10-CM | POA: Insufficient documentation

## 2017-12-29 DIAGNOSIS — T189XXA Foreign body of alimentary tract, part unspecified, initial encounter: Secondary | ICD-10-CM

## 2017-12-29 DIAGNOSIS — R198 Other specified symptoms and signs involving the digestive system and abdomen: Secondary | ICD-10-CM

## 2017-12-29 MED ORDER — IOHEXOL 300 MG/ML  SOLN
150.0000 mL | Freq: Once | INTRAMUSCULAR | Status: AC | PRN
  Administered 2017-12-29: 125 mL via ORAL

## 2017-12-29 MED ORDER — ALUM & MAG HYDROXIDE-SIMETH 200-200-20 MG/5ML PO SUSP
30.0000 mL | Freq: Once | ORAL | Status: AC
  Administered 2017-12-29: 30 mL via ORAL
  Filled 2017-12-29: qty 30

## 2017-12-29 MED ORDER — LIDOCAINE VISCOUS HCL 2 % MT SOLN
15.0000 mL | Freq: Once | OROMUCOSAL | Status: AC
  Administered 2017-12-29: 15 mL via ORAL
  Filled 2017-12-29: qty 15

## 2017-12-29 NOTE — ED Notes (Signed)
Patient verbalizes understanding of discharge instructions. Opportunity for questioning and answers were provided. 

## 2017-12-29 NOTE — ED Notes (Signed)
Pt provided with coke.

## 2017-12-29 NOTE — Discharge Instructions (Addendum)
It was our pleasure to provide your ER care today - we hope that you feel better.  Follow up with GI doctor in the next few days if symptoms fail to improve/resolve - call office this Thursday AM if symptoms have not resolved.   Your barium test was read as showing: IMPRESSION: Negative for esophageal filling defect, obstruction, narrowing, or leak. No discrete esophageal injury is evident. There is leftward mass effect on the trachea and esophagus at the thoracic inlet related to right thyroid goiter as demonstrated by chest CTA in 2018.  For the thyroid goiter, follow up with primary care doctor in the next 1-2 weeks. Also have your blood pressure rechecked then, as it is high today.  Return to ER if worse, unable to swallow, trouble breathing, other concern.

## 2017-12-29 NOTE — ED Provider Notes (Signed)
Patient is eating/drinking. No emesis.   Pt was discussed w GI - GI recommend barium swallow, if neg for acute obstruction d/c to home, and they will f/u as outpt.   Barium swallow neg for acute obstruction. Discussed w pt. Also discussed note made of  Thyroid goiter and need pcp f/u.   Pt currently swallowing without difficulty, breathing without difficulty. No stridor.  Pt currently appears stable for d/c.      Cathren LaineSteinl, Shaunice Levitan, MD 12/29/17 [phone]

## 2017-12-29 NOTE — ED Triage Notes (Signed)
Pt states that she has a jolly rancher stuck in her throat  Since yesterday ,pt able to eat and drink but still feels it in her throat , airway intact

## 2017-12-29 NOTE — ED Provider Notes (Signed)
[facility] Provider Note   CSN: [id number] Arrival date & time: 12/29/17  1028     History   Chief Complaint Chief Complaint  Patient presents with  . Foreign Body    HPI Sheila Gibson is a 47 y.o. female with a history of diabetes, hypertension presents emergency department today for throat discomfort.  Patient reports that yesterday she was eating a Chief of Staffjolly rancher when her dog frightened her and caused her to swallow an entire Chief of Staffjolly rancher. Since that time she has had some discomfort when swallowing just above her sternoclavicular notch. She states it feels like it is moving up and down in her throat, especially when she lies down. The patient reports she is in control of her secretions, has normal phonation. She reports whenever she eats she is able to swallow the food, but it only feels like it goes half way down before she vomits the food back up.  She denies history of similar in the past. She has not tried anything for her symptoms.  She rates her current pain level is moderate in severity and describes it as irritated discomfort in her throat. No shortness of breath.   HPI  Past Medical History:  Diagnosis Date  . COPD (chronic obstructive pulmonary disease) (HCC)   . Depression   . Diabetes mellitus without complication (HCC)   . Heart murmur   . Hepatic cirrhosis (HCC)   . Hypertension   . Obesity   . Thyroid disease     Patient Active Problem List   Diagnosis Date Noted  . Warts, genital 08/20/2017  . Hypertensive urgency 08/17/2017    Past Surgical History:  Procedure Laterality Date  . CESAREAN SECTION    . ESOPHAGOGASTRODUODENOSCOPY (EGD) WITH PROPOFOL N/A 09/01/2017   Procedure: ESOPHAGOGASTRODUODENOSCOPY (EGD) WITH PROPOFOL;  Surgeon: Kathi DerBrahmbhatt, Parag, MD;  Location: [facility];  Service: Gastroenterology;  Laterality: N/A;  . THYROIDECTOMY       OB History    Gravida  6   Para  5   Term  5   Preterm      AB  1   Living  4     SAB  1   TAB      Ectopic      Multiple      Live Births  4            Home Medications    Prior to Admission medications   Medication Sig Start Date End Date Taking? Authorizing Provider  acetaminophen (TYLENOL) 500 MG tablet Take 500 mg by mouth daily as needed for moderate pain.    [provider]  albuterol (PROVENTIL HFA;VENTOLIN HFA) 108 (90 BASE) MCG/ACT inhaler Inhale 1-2 puffs into the lungs every 6 (six) hours as needed for wheezing or shortness of breath.    [provider]  amLODipine (NORVASC) 10 MG tablet Take 1 tablet (10 mg total) by mouth daily. 08/19/17 09/18/17  Briant CedarEzenduka, Nkeiruka J, MD  amLODipine (NORVASC) 5 MG tablet Take 5 mg by mouth daily. 08/31/17   [provider]  azithromycin (ZITHROMAX) 250 MG tablet Take 1 tablet (250 mg total) by mouth daily. Take first 2 tablets together, then 1 every day until finished. 11/18/17   Wurst, [place], PA-C  benzonatate (TESSALON) 100 MG capsule Take 1 capsule (100 mg total) by mouth every 8 (eight) hours. 11/18/17   Wurst, [place], PA-C  gabapentin (NEURONTIN) 800 MG tablet Take 800 mg by mouth 3 (three) times  daily.     [provider]  hydrOXYzine (ATARAX/VISTARIL) 25 MG tablet Take 25 mg by mouth 3 (three) times daily as needed. for anxiety 08/05/17   [provider]  ibuprofen (ADVIL,MOTRIN) 800 MG tablet Take 800 mg by mouth every 8 (eight) hours as needed for moderate pain.    [provider]  insulin aspart protamine- aspart (NOVOLOG MIX 70/30) (70-30) 100 UNIT/ML injection Inject 6 Units into the skin 2 (two) times daily with a meal.    [provider]  insulin glargine (LANTUS) 100 unit/mL SOPN Inject 60 Units into the skin 2 (two) times daily.     [provider]  levothyroxine (SYNTHROID, LEVOTHROID) 50 MCG tablet Take 50 mcg by mouth daily.  07/20/17   [provider]  losartan (COZAAR) 25 MG  tablet Take 25 mg by mouth daily.  12/01/17   [provider]  losartan (COZAAR) 50 MG tablet Take 1 tablet (50 mg total) by mouth daily. 08/19/17 09/18/17  Briant Cedar, MD  ondansetron (ZOFRAN ODT) 4 MG disintegrating tablet 4mg  ODT q4 hours prn nausea/vomit 08/27/17   Bethann Berkshire, MD  ondansetron (ZOFRAN) 4 MG tablet Take 4 mg by mouth daily as needed for nausea or vomiting.    [provider]  tenofovir (VIREAD) 300 MG tablet Take 300 mg by mouth daily. 08/03/17   [provider]  tiZANidine (ZANAFLEX) 4 MG tablet Take 4 mg by mouth 3 (three) times daily as needed for muscle spasms.     [provider]  triamcinolone cream (KENALOG) 0.1 % Apply 1 application topically 3 (three) times daily. 08/05/17   [provider]  venlafaxine XR (EFFEXOR-XR) 37.5 MG 24 hr capsule Take 37.5 mg by mouth daily. 07/06/17   [provider]  zolpidem (AMBIEN) 10 MG tablet Take 10 mg by mouth at bedtime as needed for sleep.  01/23/17   [provider]  ZOVIRAX 5 % Apply 1 application topically 3 (three) times daily.  04/14/17   [provider]    Family History Family History  Problem Relation Age of Onset  . Cancer Mother   . Hypertension Other     Social History Social History   Tobacco Use  . Smoking status: Never Smoker  . Smokeless tobacco: Never Used  Substance Use Topics  . Alcohol use: Not Currently  . Drug use: No     Allergies   Patient has no known allergies.   Review of Systems Review of Systems  All other systems reviewed and are negative.    Physical Exam Updated Vital Signs BP (!) 148/98 (BP Location: Left Arm)   Pulse 87   Temp 99.2 F (37.3 C) (Oral)   Resp 18   SpO2 98%   Physical Exam Vitals signs and nursing note reviewed.  Constitutional:      Appearance: She is well-developed. She is not diaphoretic.  HENT:     Head: Normocephalic and atraumatic.     Comments: No visible foreign body  in posterior pharynx.  Patient with normal phonation.  No trismus.  Patient control of her secretions.    Right Ear: External ear normal.     Left Ear: External ear normal.     Nose: Nose normal.     Mouth/Throat:     Pharynx: Uvula midline.     Tonsils: No tonsillar exudate.  Eyes:     General: No scleral icterus.       Right eye: No discharge.  Left eye: No discharge.     Pupils: Pupils are equal, round, and reactive to light.  Neck:     Musculoskeletal: Neck supple. Normal range of motion. No neck rigidity or spinous process tenderness.     Trachea: Trachea normal.     Comments: No stridor Cardiovascular:     Rate and Rhythm: Normal rate and regular rhythm.     Pulses:          Radial pulses are 2+ on the right side and 2+ on the left side.       Dorsalis pedis pulses are 2+ on the right side and 2+ on the left side.       Posterior tibial pulses are 2+ on the right side and 2+ on the left side.     Heart sounds: Murmur present.  Pulmonary:     Effort: Pulmonary effort is normal.     Breath sounds: Normal breath sounds.     Comments: No increased work of breathing. No accessory muscle use. Patient is sitting upright, speaking in full sentences without difficulty  Chest:     Chest wall: No tenderness.  Abdominal:     General: Bowel sounds are normal.     Palpations: Abdomen is soft.     Tenderness: There is no abdominal tenderness. There is no guarding or rebound.  Lymphadenopathy:     Cervical: No cervical adenopathy.  Skin:    General: Skin is warm and dry.     Findings: No rash.  Neurological:     Mental Status: She is alert.      ED Treatments / Results  Labs (all labs ordered are listed, but only abnormal results are displayed) Labs Reviewed - No data to display  EKG None  Radiology No results found.  Procedures Procedures (including critical care time)  Medications Ordered in ED Medications  alum & mag hydroxide-simeth (MAALOX/MYLANTA)  200-200-20 MG/5ML suspension 30 mL (30 mLs Oral Given 12/29/17 1126)    And  lidocaine (XYLOCAINE) 2 % viscous mouth solution 15 mL (15 mLs Oral Given 12/29/17 1126)  iohexol (OMNIPAQUE) 300 MG/ML solution 150 mL (125 mLs Oral Contrast Given 12/29/17 1440)     Initial Impression / Assessment and Plan / ED Course  I have reviewed the triage vital signs and the nursing notes.  Pertinent labs & imaging results that were available during my care of the patient were reviewed by me and considered in my medical decision making (see chart for details).     47 y.o. female presenting after swallowing a jolly rancher yesterday and feeling like something is stuck in her throat. The patient denies any sob. She has normal phonation and is in control of her secretions. Lungs are CTA b/l. No stridor on exam. She complains that anytime she eats however the food only goes halfway and then she throws the food back up.   Dr Denton Lank has seen the patient and performed Laryngoscope at bedside. Visualization of epiglottis obtained. There is no evidence of FB in upper airway.   Will consult GI.   12:08 PM Spoke with Jennye Moccasin PA-C of GI who will see the patient and consult for further recommendations.   12:19 PM  Received a call back from GI. They recommended esophagram with gastrografin. If negative, patient can be discharged with follow up.   1:14 PM Patient resting comfortably. Tolerating PO fluids at this time.   Patient signed out to Dr. Denton Lank with result of esophogram pending.  Final Clinical Impressions(s) / ED Diagnoses   Final diagnoses:  None    ED Discharge Orders    None       Princella PellegriniMaczis, Karsin Pesta M, PA-C 12/29/17 1512    Cathren LaineSteinl, Kevin, MD 12/29/17 [phone]

## 2018-01-10 NOTE — Progress Notes (Signed)
[facility] - Clinic Note  01/11/2018     CHIEF COMPLAINT Patient presents for Retina Follow Up   HISTORY OF PRESENT ILLNESS: Sheila Gibson is a 48 y.o. female who presents to the clinic today for:   HPI    Retina Follow Up    Patient presents with  Diabetic Retinopathy.  In both eyes.  Severity is moderate.  Duration of 4 weeks.  I, the attending physician,  performed the HPI with the patient and updated documentation appropriately.          Comments    Patient states vision the same OU. BS was 110 this am. Last A1c unknown but checked on 01/08/2018.        Last edited by Sheila Chris, MD on 01/11/2018 11:31 AM. (History)    pt feels like her vision is doing a little bit better  Referring physician: No referring provider defined for this encounter.  HISTORICAL INFORMATION:   Selected notes from the MEDICAL RECORD NUMBER Referred by Sheila Gibson for concern of PDR and CSME OU LEE: 06.24.19 Sheila Gibson) [BCVA: OD: 20/60-2 OS: 20/25] Ocular Hx-DME OU, PDR OU, cataract OU PMH-DM (taking Lantus and Novolog), Thyroid disease, HTN    CURRENT MEDICATIONS: No current outpatient medications on file. (Ophthalmic Drugs)   No current facility-administered medications for this visit.  (Ophthalmic Drugs)   Current Outpatient Medications (Other)  Medication Sig  . acetaminophen (TYLENOL) 500 MG tablet Take 500 mg by mouth daily as needed for moderate pain.  Marland Kitchen albuterol (PROVENTIL HFA;VENTOLIN HFA) 108 (90 BASE) MCG/ACT inhaler Inhale 1-2 puffs into the lungs every 6 (six) hours as needed for wheezing or shortness of breath.  Marland Kitchen amLODipine (NORVASC) 5 MG tablet Take 5 mg by mouth daily.  Marland Kitchen azithromycin (ZITHROMAX) 250 MG tablet Take 1 tablet (250 mg total) by mouth daily. Take first 2 tablets together, then 1 every day until finished.  . benzonatate (TESSALON) 100 MG capsule Take 1 capsule (100 mg total) by mouth every 8 (eight) hours.  . gabapentin  (NEURONTIN) 800 MG tablet Take 800 mg by mouth 3 (three) times daily.   . hydrOXYzine (ATARAX/VISTARIL) 25 MG tablet Take 25 mg by mouth 3 (three) times daily as needed. for anxiety  . insulin aspart protamine- aspart (NOVOLOG MIX 70/30) (70-30) 100 UNIT/ML injection Inject 6 Units into the skin 2 (two) times daily with a meal.  . insulin glargine (LANTUS) 100 unit/mL SOPN Inject 60 Units into the skin 2 (two) times daily.   Marland Kitchen levothyroxine (SYNTHROID, LEVOTHROID) 50 MCG tablet Take 50 mcg by mouth daily.   Marland Kitchen losartan (COZAAR) 25 MG tablet Take 25 mg by mouth daily.   . ondansetron (ZOFRAN ODT) 4 MG disintegrating tablet 4mg  ODT q4 hours prn nausea/vomit  . ondansetron (ZOFRAN) 4 MG tablet Take 4 mg by mouth daily as needed for nausea or vomiting.  Marland Kitchen tenofovir (VIREAD) 300 MG tablet Take 300 mg by mouth daily.  Marland Kitchen tiZANidine (ZANAFLEX) 4 MG tablet Take 4 mg by mouth 3 (three) times daily as needed for muscle spasms.   Marland Kitchen triamcinolone cream (KENALOG) 0.1 % Apply 1 application topically 3 (three) times daily.  Marland Kitchen venlafaxine XR (EFFEXOR-XR) 37.5 MG 24 hr capsule Take 37.5 mg by mouth daily.  Marland Kitchen zolpidem (AMBIEN) 10 MG tablet Take 10 mg by mouth at bedtime as needed for sleep.   Marland Kitchen ZOVIRAX 5 % Apply 1 application topically 3 (three) times daily.   Marland Kitchen amLODipine (  NORVASC) 10 MG tablet Take 1 tablet (10 mg total) by mouth daily.  Marland Kitchen. amoxicillin-clavulanate (AUGMENTIN) 875-125 MG tablet Take 1 tablet by mouth every 12 (twelve) hours for 7 days.  . fluticasone (FLONASE) 50 MCG/ACT nasal spray Place 1-2 sprays into both nostrils daily for 7 days.  Marland Kitchen. ibuprofen (ADVIL,MOTRIN) 600 MG tablet Take 1 tablet (600 mg total) by mouth every 6 (six) hours as needed.  Marland Kitchen. losartan (COZAAR) 50 MG tablet Take 1 tablet (50 mg total) by mouth daily.   Current Facility-Administered Medications (Other)  Medication Route  . Bevacizumab (AVASTIN) SOLN 1.25 mg Intravitreal  . Bevacizumab (AVASTIN) SOLN 1.25 mg Intravitreal  .  Bevacizumab (AVASTIN) SOLN 1.25 mg Intravitreal  . Bevacizumab (AVASTIN) SOLN 1.25 mg Intravitreal  . Bevacizumab (AVASTIN) SOLN 1.25 mg Intravitreal  . Bevacizumab (AVASTIN) SOLN 1.25 mg Intravitreal  . Bevacizumab (AVASTIN) SOLN 1.25 mg Intravitreal  . Bevacizumab (AVASTIN) SOLN 1.25 mg Intravitreal  . Bevacizumab (AVASTIN) SOLN 1.25 mg Intravitreal      REVIEW OF SYSTEMS: ROS    Positive for: Endocrine, Eyes   Negative for: Constitutional, Gastrointestinal, Neurological, Skin, Genitourinary, Musculoskeletal, HENT, Cardiovascular, Respiratory, Psychiatric, Allergic/Imm, Heme/Lymph   Last edited by Sheila Gibson, Sheila Gibson on 01/11/2018  9:49 AM. (History)       ALLERGIES No Known Allergies  PAST MEDICAL HISTORY Past Medical History:  Diagnosis Date  . COPD (chronic obstructive pulmonary disease) (HCC)   . Depression   . Diabetes mellitus without complication (HCC)   . Heart murmur   . Hepatic cirrhosis (HCC)   . Hypertension   . Obesity   . Thyroid disease    Past Surgical History:  Procedure Laterality Date  . CESAREAN SECTION    . ESOPHAGOGASTRODUODENOSCOPY (EGD) WITH PROPOFOL N/A 09/01/2017   Procedure: ESOPHAGOGASTRODUODENOSCOPY (EGD) WITH PROPOFOL;  Surgeon: Kathi DerBrahmbhatt, Parag, MD;  Location: [facility];  Service: Gastroenterology;  Laterality: N/A;  . THYROIDECTOMY      FAMILY HISTORY Family History  Problem Relation Age of Onset  . Cancer Mother   . Hypertension Other     SOCIAL HISTORY Social History   Tobacco Use  . Smoking status: Never Smoker  . Smokeless tobacco: Never Used  Substance Use Topics  . Alcohol use: Not Currently  . Drug use: No         OPHTHALMIC EXAM:  Base Eye Exam    Visual Acuity (Snellen - Linear)      Right Left   Dist [place] 20/60 20/50 -1   Dist ph [place] 20/30 -2 20/25 -1       Tonometry (Tonopen, 9:58 AM)      Right Left   Pressure 19 17       Pupils      Dark Light Shape React APD   Right 4 3 Round Brisk None   Left 4  3 Round Brisk None       Visual Fields (Counting fingers)      Left Right    Full Full       Extraocular Movement      Right Left    Full, Ortho Full, Ortho       Neuro/Psych    Oriented x3:  Yes   Mood/Affect:  Normal       Dilation    Both eyes:  1.0% Mydriacyl, 2.5% Phenylephrine @ 9:58 AM        Slit Lamp and Fundus Exam    Slit Lamp Exam      Right Left  Lids/Lashes Dermatochalasis - upper lid Dermatochalasis - upper lid   Conjunctiva/Sclera Melanosis Melanosis   Cornea Arcus, 2+ diffuse Punctate epithelial erosions Arcus, 2+ diffuse Punctate epithelial erosions   Anterior Chamber Deep and quiet Deep and quiet   Iris Round and dilated Round and dilated   Lens 1+ Nuclear sclerosis, 2+ Cortical cataract 1+ Nuclear sclerosis, 2+ Cortical cataract   Vitreous Trace Vitreous syneresis Trace Vitreous syneresis       Fundus Exam      Right Left   Disc Pink and Sharp Pink and Sharp   C/Gibson Ratio 0.4 0.5   Macula Blunted foveal reflex, interval increase in central edema/IRF/SRF, parafoveal exudates inferior to macula and SN fovea, Intraretinal hemorrhage and CWS SN to fovea shrinking Good foveal reflex, cluster of Microaneurysms superior to fovea with +edema -- persistent, scattered Intraretinal hemorrhage, scattered DBH, mild focal laser scars superior to fovea   Vessels Dilated and Tortuous veins, +beading -- slight improvement, Copper wiring Dilated and Tortuous veins, Copper wiring   Periphery Attached, +IRH, +CWS -- scattered surrounding disc, scattered posterior DBH Attached, 360 IRH and CWS superiorly and around nerve, Cotton wool spots and DBH nasal to fovea          IMAGING AND PROCEDURES  Imaging and Procedures for @TODAY @  OCT, Retina - OU - Both Eyes       Right Eye Quality was good. Central Foveal Thickness: 330. Progression has worsened. Findings include intraretinal fluid, no SRF, outer retinal atrophy, normal foveal contour (interval worsening of IRF).    Left Eye Quality was good. Central Foveal Thickness: 254. Progression has been stable. Findings include normal foveal contour, intraretinal fluid, no SRF (Interval improvement in superior IRF/interval increase in nasal cystic changes).   Notes *Images captured and stored on drive  Diagnosis / Impression:  OD: central DME -- interval wosening of IRF OS: +DME superior to fovea -- persistent IRF superior to fovea  Clinical management:  See below  Abbreviations: NFP - Normal foveal profile. CME - cystoid macular edema. PED - pigment epithelial detachment. IRF - intraretinal fluid. SRF - subretinal fluid. EZ - ellipsoid zone. ERM - epiretinal membrane. ORA - outer retinal atrophy. ORT - outer retinal tubulation. SRHM - subretinal hyper-reflective material         Intravitreal Injection, Pharmacologic Agent - OD - Right Eye       Time Out 01/11/2018. 11:47 AM. Confirmed correct patient, procedure, site, and patient consented.   Anesthesia Topical anesthesia was used. Anesthetic medications included Lidocaine 2%, Proparacaine 0.5%.   Procedure Preparation included 5% betadine to ocular surface, eyelid speculum. A 30 gauge needle was used.   Injection:  1.25 mg Bevacizumab (AVASTIN) SOLN   NDC: 16109-604-5450242-060-01, Lot: [id number] , Expiration date: 02/26/2018   Route: Intravitreal, Site: Right Eye, Waste: 0 mL  Post-op Post injection exam found visual acuity of at least counting fingers. The patient tolerated the procedure well. There were no complications. The patient received written and verbal post procedure care education.        Intravitreal Injection, Pharmacologic Agent - OS - Left Eye       Time Out 01/11/2018. 11:47 AM. Confirmed correct patient, procedure, site, and patient consented.   Anesthesia Topical anesthesia was used. Anesthetic medications included Lidocaine 2%, Proparacaine 0.5%.   Procedure Preparation included 5% betadine to ocular surface, eyelid  speculum. A supplied needle was used.   Injection:  1.25 mg Bevacizumab (AVASTIN) SOLN   NDC: 76283-151-7650242-060-01, Lot: [id number] , Expiration date: 03/01/2018  Route: Intravitreal, Site: Left Eye, Waste: 0 mL  Post-op Post injection exam found visual acuity of at least counting fingers. The patient tolerated the procedure well. There were no complications. The patient received written and verbal post procedure care education.                 ASSESSMENT/PLAN:    ICD-10-CM   1. Severe nonproliferative diabetic retinopathy of both eyes with macular edema associated with type 2 diabetes mellitus (HCC) M27.0786 Intravitreal Injection, Pharmacologic Agent - OD - Right Eye    Intravitreal Injection, Pharmacologic Agent - OS - Left Eye    Bevacizumab (AVASTIN) SOLN 1.25 mg    Bevacizumab (AVASTIN) SOLN 1.25 mg  2. Retinal edema H35.81 OCT, Retina - OU - Both Eyes  3. Essential hypertension I10   4. Hypertensive retinopathy of both eyes H35.033   5. 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 Severe Non-proliferative diabetic retinopathy, OU - exam with extensive 360 IRH, CWS and venous beading OU - initial FA shows late leaking MAs OU, No NV - OCT shows diabetic macular edema, OU (OD > OS, OS non-central) - S/P IVA OD #1 (07.18.19), #2 (08.14.19), #3 (09.11.19), #4 (10.14.19), #5 (11.11.19), #6 (12.09.19) - S/P IVA OS #1 (12.09.19) - S/P focal laser OS (09.25.19) - OCT shows interval increase in DME OU - BCVA 20/30 OD, 20/25 OS - recommend IVA OU today 01.06.20, OD #7 OS, #2 - pt wishes to proceed - RBA of procedure discussed, questions answered - informed consent obtained and signed - see procedure note - f/u in 4 weeks -- DFE/OCT/possible injxn vs focal laser  3,4. Hypertensive retinopathy OU - discussed importance of tight BP control - monitor  5. Combined form age related cataract OU-  - The symptoms of cataract, surgical options, and treatments and  risks were discussed with patient. - discussed diagnosis and progression - not yet visually significant - monitor for now   Ophthalmic Meds Ordered this visit:  Meds ordered this encounter  Medications  . Bevacizumab (AVASTIN) SOLN 1.25 mg  . Bevacizumab (AVASTIN) SOLN 1.25 mg       Return in about 4 weeks (around 02/08/2018) for f/u NPDR OU, DFE, OCT.  There are no Patient Instructions on file for this visit.  This document serves as a record of services personally performed by Karie Chimera, MD, PhD. It was created on their behalf by Laurian Brim, OA, an ophthalmic assistant. The creation of this record is the provider's dictation and/or activities during the visit.    Electronically signed by: Laurian Brim, OA  01.05.2020 12:17 AM    Karie Chimera, M.Gibson., Ph.Gibson. Diseases & Surgery of the Retina and Vitreous [facility]  I have reviewed the above documentation for accuracy and completeness, and I agree with the above. Karie Chimera, M.Gibson., Ph.Gibson. 01/13/18 12:19 AM    Abbreviations: M myopia (nearsighted); A astigmatism; H hyperopia (farsighted); P presbyopia; Mrx spectacle prescription;  CTL contact lenses; OD right eye; OS left eye; OU both eyes  XT exotropia; ET esotropia; PEK punctate epithelial keratitis; PEE punctate epithelial erosions; DES dry eye syndrome; MGD meibomian gland dysfunction; ATs artificial tears; PFAT's preservative free artificial tears; NSC nuclear sclerotic cataract; PSC posterior subcapsular cataract; ERM epi-retinal membrane; PVD posterior vitreous detachment; RD retinal detachment; DM diabetes mellitus; DR diabetic retinopathy; NPDR non-proliferative diabetic retinopathy; PDR proliferative diabetic retinopathy; CSME clinically significant macular edema; DME diabetic macular  edema; dbh dot blot hemorrhages; CWS cotton wool spot; POAG primary open angle glaucoma; C/Gibson cup-to-disc ratio; HVF humphrey visual field; GVF goldmann visual  field; OCT optical coherence tomography; IOP intraocular pressure; BRVO Branch retinal vein occlusion; CRVO central retinal vein occlusion; CRAO central retinal artery occlusion; BRAO branch retinal artery occlusion; RT retinal tear; SB scleral buckle; PPV pars plana vitrectomy; VH Vitreous hemorrhage; PRP panretinal laser photocoagulation; IVK intravitreal kenalog; VMT vitreomacular traction; MH Macular hole;  NVD neovascularization of the disc; NVE neovascularization elsewhere; AREDS age related eye disease study; ARMD age related macular degeneration; POAG primary open angle glaucoma; EBMD epithelial/anterior basement membrane dystrophy; ACIOL anterior chamber intraocular lens; IOL intraocular lens; PCIOL posterior chamber intraocular lens; Phaco/IOL phacoemulsification with intraocular lens placement; PRK photorefractive keratectomy; LASIK laser assisted in situ keratomileusis; HTN hypertension; DM diabetes mellitus; COPD chronic obstructive pulmonary disease

## 2018-01-11 ENCOUNTER — Ambulatory Visit (INDEPENDENT_AMBULATORY_CARE_PROVIDER_SITE_OTHER): Payer: Medicaid Other | Admitting: Ophthalmology

## 2018-01-11 ENCOUNTER — Encounter (INDEPENDENT_AMBULATORY_CARE_PROVIDER_SITE_OTHER): Payer: Self-pay | Admitting: Ophthalmology

## 2018-01-11 DIAGNOSIS — E113413 Type 2 diabetes mellitus with severe nonproliferative diabetic retinopathy with macular edema, bilateral: Secondary | ICD-10-CM | POA: Diagnosis not present

## 2018-01-11 DIAGNOSIS — H25813 Combined forms of age-related cataract, bilateral: Secondary | ICD-10-CM

## 2018-01-11 DIAGNOSIS — H3581 Retinal edema: Secondary | ICD-10-CM | POA: Diagnosis not present

## 2018-01-11 DIAGNOSIS — I10 Essential (primary) hypertension: Secondary | ICD-10-CM | POA: Diagnosis not present

## 2018-01-11 DIAGNOSIS — H35033 Hypertensive retinopathy, bilateral: Secondary | ICD-10-CM

## 2018-01-11 MED ORDER — BEVACIZUMAB CHEMO INJECTION 1.25MG/0.05ML SYRINGE FOR KALEIDOSCOPE
1.2500 mg | INTRAVITREAL | Status: AC
  Administered 2018-01-11: 1.25 mg via INTRAVITREAL

## 2018-01-12 ENCOUNTER — Ambulatory Visit (HOSPITAL_COMMUNITY)
Admission: EM | Admit: 2018-01-12 | Discharge: 2018-01-12 | Disposition: A | Discharge status: Home / Self Care | Payer: Medicaid Other | Attending: Emergency Medicine | Admitting: Emergency Medicine

## 2018-01-12 ENCOUNTER — Encounter (HOSPITAL_COMMUNITY): Payer: Self-pay

## 2018-01-12 DIAGNOSIS — H9203 Otalgia, bilateral: Secondary | ICD-10-CM | POA: Diagnosis not present

## 2018-01-12 DIAGNOSIS — R0981 Nasal congestion: Secondary | ICD-10-CM | POA: Diagnosis not present

## 2018-01-12 DIAGNOSIS — L02213 Cutaneous abscess of chest wall: Secondary | ICD-10-CM | POA: Insufficient documentation

## 2018-01-12 MED ORDER — IBUPROFEN 600 MG PO TABS: 600.0000 mg | ORAL_TABLET | Freq: Four times a day (QID) | ORAL | 0 refills | Status: DC | PRN

## 2018-01-12 MED ORDER — AMOXICILLIN-POT CLAVULANATE 875-125 MG PO TABS: 1.0000 | ORAL_TABLET | Freq: Two times a day (BID) | ORAL | 0 refills | Status: AC

## 2018-01-12 MED ORDER — FLUTICASONE PROPIONATE 50 MCG/ACT NA SUSP: 1.0000 | Freq: Every day | NASAL | 0 refills | Status: DC

## 2018-01-12 NOTE — ED Triage Notes (Signed)
Pt presents with pain in both ears.

## 2018-01-12 NOTE — ED Provider Notes (Signed)
[facility]    CSN: [id number] Arrival date & time: 01/12/18  0857     History   Chief Complaint Chief Complaint  Patient presents with  . Otalgia    HPI [place] Theodoro GristDawn Daoud is a 48 y.o. female history of hypertension, DM type II, COPD presenting today for evaluation of ear pain and abscess evaluation.  Patient states that for the past week she has had discomfort in her ears.  States that someone at work looked at them and told her she had a double ear infection with fluid.  She has had continued pain.  Taking Tylenol and ibuprofen.  Patient notes that yesterday she had injections to help with fluid that she has behind her eyes.  She also notes that she has had associated nasal congestion.  Denies cough or sore throat.  She also has had an abscess to her left upper chest.  States that she popped it and drained it at home.  Since she has seen continued drainage, slight tenderness.  HPI  Past Medical History:  Diagnosis Date  . COPD (chronic obstructive pulmonary disease) (HCC)   . Depression   . Diabetes mellitus without complication (HCC)   . Heart murmur   . Hepatic cirrhosis (HCC)   . Hypertension   . Obesity   . Thyroid disease     Patient Active Problem List   Diagnosis Date Noted  . Warts, genital 08/20/2017  . Hypertensive urgency 08/17/2017    Past Surgical History:  Procedure Laterality Date  . CESAREAN SECTION    . ESOPHAGOGASTRODUODENOSCOPY (EGD) WITH PROPOFOL N/A 09/01/2017   Procedure: ESOPHAGOGASTRODUODENOSCOPY (EGD) WITH PROPOFOL;  Surgeon: Kathi DerBrahmbhatt, Parag, MD;  Location: [facility];  Service: Gastroenterology;  Laterality: N/A;  . THYROIDECTOMY      OB History    Gravida  6   Para  5   Term  5   Preterm      AB  1   Living  4     SAB  1   TAB      Ectopic      Multiple      Live Births  4            Home Medications    Prior to Admission medications   Medication Sig Start Date End Date Taking? Authorizing  Provider  acetaminophen (TYLENOL) 500 MG tablet Take 500 mg by mouth daily as needed for moderate pain.    [provider]  albuterol (PROVENTIL HFA;VENTOLIN HFA) 108 (90 BASE) MCG/ACT inhaler Inhale 1-2 puffs into the lungs every 6 (six) hours as needed for wheezing or shortness of breath.    [provider]  amLODipine (NORVASC) 10 MG tablet Take 1 tablet (10 mg total) by mouth daily. 08/19/17 09/18/17  Briant CedarEzenduka, Nkeiruka J, MD  amLODipine (NORVASC) 5 MG tablet Take 5 mg by mouth daily. 08/31/17   [provider]  amoxicillin-clavulanate (AUGMENTIN) 875-125 MG tablet Take 1 tablet by mouth every 12 (twelve) hours for 7 days. 01/12/18 01/19/18  Graeden Bitner C, PA-C  azithromycin (ZITHROMAX) 250 MG tablet Take 1 tablet (250 mg total) by mouth daily. Take first 2 tablets together, then 1 every day until finished. 11/18/17   Wurst, GrenadaBrittany, PA-C  benzonatate (TESSALON) 100 MG capsule Take 1 capsule (100 mg total) by mouth every 8 (eight) hours. 11/18/17   Wurst, GrenadaBrittany, PA-C  fluticasone (FLONASE) 50 MCG/ACT nasal spray Place 1-2 sprays into both nostrils daily for 7 days. 01/12/18 01/19/18  Azeez Dunker C, PA-C  gabapentin (NEURONTIN) 800 MG tablet Take 800 mg by mouth 3 (three) times daily.     [provider]  hydrOXYzine (ATARAX/VISTARIL) 25 MG tablet Take 25 mg by mouth 3 (three) times daily as needed. for anxiety 08/05/17   [provider]  ibuprofen (ADVIL,MOTRIN) 600 MG tablet Take 1 tablet (600 mg total) by mouth every 6 (six) hours as needed. 01/12/18   Tyesha Joffe C, PA-C  insulin aspart protamine- aspart (NOVOLOG MIX 70/30) (70-30) 100 UNIT/ML injection Inject 6 Units into the skin 2 (two) times daily with a meal.    [provider]  insulin glargine (LANTUS) 100 unit/mL SOPN Inject 60 Units into the skin 2 (two) times daily.     [provider]  levothyroxine (SYNTHROID, LEVOTHROID) 50 MCG tablet Take 50 mcg by mouth daily.   07/20/17   [provider]  losartan (COZAAR) 25 MG tablet Take 25 mg by mouth daily.  12/01/17   [provider]  losartan (COZAAR) 50 MG tablet Take 1 tablet (50 mg total) by mouth daily. 08/19/17 09/18/17  Briant Cedar, MD  ondansetron (ZOFRAN ODT) 4 MG disintegrating tablet 4mg  ODT q4 hours prn nausea/vomit 08/27/17   Bethann Berkshire, MD  ondansetron (ZOFRAN) 4 MG tablet Take 4 mg by mouth daily as needed for nausea or vomiting.    [provider]  tenofovir (VIREAD) 300 MG tablet Take 300 mg by mouth daily. 08/03/17   [provider]  tiZANidine (ZANAFLEX) 4 MG tablet Take 4 mg by mouth 3 (three) times daily as needed for muscle spasms.     [provider]  triamcinolone cream (KENALOG) 0.1 % Apply 1 application topically 3 (three) times daily. 08/05/17   [provider]  venlafaxine XR (EFFEXOR-XR) 37.5 MG 24 hr capsule Take 37.5 mg by mouth daily. 07/06/17   [provider]  zolpidem (AMBIEN) 10 MG tablet Take 10 mg by mouth at bedtime as needed for sleep.  01/23/17   [provider]  ZOVIRAX 5 % Apply 1 application topically 3 (three) times daily.  04/14/17   [provider]    Family History Family History  Problem Relation Age of Onset  . Cancer Mother   . Hypertension Other     Social History Social History   Tobacco Use  . Smoking status: Never Smoker  . Smokeless tobacco: Never Used  Substance Use Topics  . Alcohol use: Not Currently  . Drug use: No     Allergies   Patient has no known allergies.   Review of Systems Review of Systems  Constitutional: Negative for activity change, appetite change, chills, fatigue and fever.  HENT: Positive for congestion, ear pain and rhinorrhea. Negative for ear discharge, sinus pressure, sore throat and trouble swallowing.   Eyes: Negative for discharge and redness.  Respiratory: Positive for cough. Negative for chest tightness and shortness of breath.    Cardiovascular: Negative for chest pain.  Gastrointestinal: Negative for abdominal pain, diarrhea, nausea and vomiting.  Musculoskeletal: Negative for myalgias.  Skin: Positive for wound. Negative for rash.  Neurological: Negative for dizziness, light-headedness and headaches.     Physical Exam Triage Vital Signs ED Triage Vitals  Enc Vitals Group     BP 01/12/18 0926 (!) 176/107     Pulse Rate 01/12/18 0926 91     Resp 01/12/18 0926 20     Temp 01/12/18 0926 98 F (36.7 C)     Temp Source  01/12/18 0926 Oral     SpO2 01/12/18 0926 97 %     Weight --      Height --      Head Circumference --      Peak Flow --      Pain Score 01/12/18 0927 10     Pain Loc --      Pain Edu? --      Excl. in GC? --    No data found.  Updated Vital Signs BP (!) 176/107 (BP Location: Right Arm) Comment: Pt did not take medication this morning  Pulse 91   Temp 98 F (36.7 C) (Oral)   Resp 20   SpO2 97%   Visual Acuity Right Eye Distance:   Left Eye Distance:   Bilateral Distance:    Right Eye Near:   Left Eye Near:    Bilateral Near:     Physical Exam Vitals signs and nursing note reviewed.  Constitutional:      General: She is not in acute distress.    Appearance: She is well-developed.  HENT:     Head: Normocephalic and atraumatic.     Ears:     Comments: Bilateral ears without tenderness to palpation of external auricle, tragus and mastoid, EAC's without erythema or swelling, TM's with good bony landmarks and cone of light. Non erythematous.    Nose:     Comments: Nasal mucosa erythematous and swollen turbinates    Mouth/Throat:     Comments: Oral mucosa pink and moist, no tonsillar enlargement or exudate. Posterior pharynx patent and nonerythematous, no uvula deviation or swelling. Normal phonation. Eyes:     Conjunctiva/sclera: Conjunctivae normal.  Neck:     Musculoskeletal: Neck supple.  Cardiovascular:     Rate and Rhythm: Normal rate and regular rhythm.     Heart  sounds: Murmur present.  Pulmonary:     Effort: Pulmonary effort is normal. No respiratory distress.     Breath sounds: Normal breath sounds.     Comments: Breathing comfortably at rest, CTABL, no wheezing, rales or other adventitious sounds auscultated Abdominal:     Palpations: Abdomen is soft.     Tenderness: There is no abdominal tenderness.  Skin:    General: Skin is warm and dry.     Comments: Area to left upper chest approximately 3 cm x 2 cm with central scabbing, surrounding discoloration, slight erythema, minimal tenderness to palpation, no active draining  Neurological:     Mental Status: She is alert.      UC Treatments / Results  Labs (all labs ordered are listed, but only abnormal results are displayed) Labs Reviewed - No data to display  EKG None  Radiology No results found.  Procedures Procedures (including critical care time)  Medications Ordered in UC Medications - No data to display  Initial Impression / Assessment and Plan / UC Course  I have reviewed the triage vital signs and the nursing notes.  Pertinent labs & imaging results that were available during my care of the patient were reviewed by me and considered in my medical decision making (see chart for details).     Patient does not appear to have ear infection at this time.  TMs appear normal.  Will treat congestion with Flonase and any sub-given dairy eustachian tube dysfunction causing discomfort in ears.  Continue Tylenol and ibuprofen as needed.  Provide Augmentin to treat for abscess although this does appear to be healing, does not appear to warrant drainage  at this time, will provide Augmentin to treat any underlying infection that has not fully resolved.  Warm compresses.  Discussed strict return precautions. Patient verbalized understanding and is agreeable with plan.  Final Clinical Impressions(s) / UC Diagnoses   Final diagnoses:  Otalgia of both ears  Nasal congestion  Abscess  of chest wall     Discharge Instructions     Abscess Begin Augmentin twice daily for 1 week Warm compresses with gentle massage  Ear Pain Likely secondary to congestion, no infection at this time Begin flonase nasal spray 1-2 sprays each nostril Tylenol and Ibuprofen temporarily as needed for pain  Follow up is symptoms not resolving or worsening   ED Prescriptions    Medication Sig Dispense Auth. Provider   amoxicillin-clavulanate (AUGMENTIN) 875-125 MG tablet Take 1 tablet by mouth every 12 (twelve) hours for 7 days. 14 tablet Tewana Bohlen C, PA-C   ibuprofen (ADVIL,MOTRIN) 600 MG tablet Take 1 tablet (600 mg total) by mouth every 6 (six) hours as needed. 30 tablet Jennavieve Arrick C, PA-C   fluticasone (FLONASE) 50 MCG/ACT nasal spray Place 1-2 sprays into both nostrils daily for 7 days. 1 g Ulrick Methot, LeonHallie C, PA-C     Controlled Substance Prescriptions  Controlled Substance Registry consulted? Not Applicable   Lew DawesWieters, Shruti Arrey C, [place] JerseyPA-C 01/12/18 1016

## 2018-01-12 NOTE — Discharge Instructions (Signed)
Abscess Begin Augmentin twice daily for 1 week Warm compresses with gentle massage  Ear Pain Likely secondary to congestion, no infection at this time Begin flonase nasal spray 1-2 sprays each nostril Tylenol and Ibuprofen temporarily as needed for pain  Follow up is symptoms not resolving or worsening

## 2018-01-13 ENCOUNTER — Encounter (INDEPENDENT_AMBULATORY_CARE_PROVIDER_SITE_OTHER): Payer: Self-pay | Admitting: Ophthalmology

## 2018-01-18 ENCOUNTER — Ambulatory Visit (HOSPITAL_COMMUNITY)
Admission: EM | Admit: 2018-01-18 | Discharge: 2018-01-18 | Disposition: A | Discharge status: Home / Self Care | Payer: Medicaid Other | Attending: Family Medicine | Admitting: Family Medicine

## 2018-01-18 ENCOUNTER — Ambulatory Visit (INDEPENDENT_AMBULATORY_CARE_PROVIDER_SITE_OTHER): Payer: Medicaid Other

## 2018-01-18 ENCOUNTER — Encounter (HOSPITAL_COMMUNITY): Payer: Self-pay

## 2018-01-18 DIAGNOSIS — I10 Essential (primary) hypertension: Secondary | ICD-10-CM | POA: Diagnosis not present

## 2018-01-18 DIAGNOSIS — E119 Type 2 diabetes mellitus without complications: Secondary | ICD-10-CM | POA: Insufficient documentation

## 2018-01-18 DIAGNOSIS — R05 Cough: Secondary | ICD-10-CM | POA: Diagnosis not present

## 2018-01-18 DIAGNOSIS — R059 Cough, unspecified: Secondary | ICD-10-CM

## 2018-01-18 DIAGNOSIS — Z794 Long term (current) use of insulin: Secondary | ICD-10-CM | POA: Insufficient documentation

## 2018-01-18 DIAGNOSIS — R509 Fever, unspecified: Secondary | ICD-10-CM | POA: Insufficient documentation

## 2018-01-18 DIAGNOSIS — R030 Elevated blood-pressure reading, without diagnosis of hypertension: Secondary | ICD-10-CM | POA: Insufficient documentation

## 2018-01-18 DIAGNOSIS — R6883 Chills (without fever): Secondary | ICD-10-CM | POA: Insufficient documentation

## 2018-01-18 MED ORDER — LOSARTAN POTASSIUM 100 MG PO TABS: 100.0000 mg | ORAL_TABLET | Freq: Every day | ORAL | 0 refills | Status: DC

## 2018-01-18 MED ORDER — IBUPROFEN 800 MG PO TABS
ORAL_TABLET | ORAL | Status: AC
  Filled 2018-01-18: qty 1

## 2018-01-18 MED ORDER — IBUPROFEN 800 MG PO TABS
800.0000 mg | ORAL_TABLET | Freq: Once | ORAL | Status: AC
  Administered 2018-01-18: 800 mg via ORAL

## 2018-01-18 MED ORDER — BENZONATATE 100 MG PO CAPS: 100.0000 mg | ORAL_CAPSULE | Freq: Three times a day (TID) | ORAL | 0 refills | Status: DC | PRN

## 2018-01-18 MED ORDER — BENZONATATE 100 MG PO CAPS: 100.0000 mg | ORAL_CAPSULE | Freq: Three times a day (TID) | ORAL | 0 refills | Status: DC

## 2018-01-18 MED ORDER — ACETAMINOPHEN 325 MG PO TABS
650.0000 mg | ORAL_TABLET | Freq: Once | ORAL | Status: AC
  Administered 2018-01-18: 650 mg via ORAL

## 2018-01-18 MED ORDER — ACETAMINOPHEN 325 MG PO TABS
ORAL_TABLET | ORAL | Status: AC
  Filled 2018-01-18: qty 2

## 2018-01-18 MED ORDER — PROMETHAZINE-DM 6.25-15 MG/5ML PO SYRP: 5.0000 mL | ORAL_SOLUTION | Freq: Three times a day (TID) | ORAL | 0 refills | Status: DC | PRN

## 2018-01-18 NOTE — ED Notes (Signed)
Patient changing into gown for x ray

## 2018-01-18 NOTE — Discharge Instructions (Addendum)
Please make sure you set up an appointment with your PCP. Discuss your blood pressure management as well. Take ibuprofen at 400-800mg  once every 8 hours as you need to for pain and inflammation. Hydrate very well with at least 2 liters of water. Eat light meals such as soups to replenish electrolytes and get good nutrition. [facility] is an option for you if you would like to set up an appointment for your care.

## 2018-01-18 NOTE — ED Provider Notes (Signed)
MRN: [id number] DOB: 05/15/1970  Subjective:   Sheila Gibson is a 48 y.o. female presenting for 3 day history of worsening, constant, productive cough that elicits nausea, chest pain, shob, wheezing. Has a history of DM, HTN, OSA, neuropathy, diabetic retinopathy. Denies smoking cigarettes. Has exposure to second hand smoke. Last blood sugar check was last low 100s yesterday. Of note, patient had a bout of pneumonia 11/18/2017. She underwent course of azithromycin then. She just finished Augmentin for an abscess.    Current Facility-Administered Medications:  .  Bevacizumab (AVASTIN) SOLN 1.25 mg, 1.25 mg, Intravitreal, , Rennis ChrisZamora, Brian, MD, 1.25 mg at 07/23/17 0033 .  Bevacizumab (AVASTIN) SOLN 1.25 mg, 1.25 mg, Intravitreal, , Rennis ChrisZamora, Brian, MD, 1.25 mg at 08/21/17 0031 .  Bevacizumab (AVASTIN) SOLN 1.25 mg, 1.25 mg, Intravitreal, , Rennis ChrisZamora, Brian, MD, 1.25 mg at 09/16/17 1630 .  Bevacizumab (AVASTIN) SOLN 1.25 mg, 1.25 mg, Intravitreal, , Rennis ChrisZamora, Brian, MD, 1.25 mg at 10/19/17 1738 .  Bevacizumab (AVASTIN) SOLN 1.25 mg, 1.25 mg, Intravitreal, , Rennis ChrisZamora, Brian, MD, 1.25 mg at 11/16/17 1442 .  Bevacizumab (AVASTIN) SOLN 1.25 mg, 1.25 mg, Intravitreal, , Rennis ChrisZamora, Brian, MD, 1.25 mg at 12/14/17 2339 .  Bevacizumab (AVASTIN) SOLN 1.25 mg, 1.25 mg, Intravitreal, , Rennis ChrisZamora, Brian, MD, 1.25 mg at 12/14/17 2339 .  Bevacizumab (AVASTIN) SOLN 1.25 mg, 1.25 mg, Intravitreal, , Rennis ChrisZamora, Brian, MD, 1.25 mg at 01/11/18 1501 .  Bevacizumab (AVASTIN) SOLN 1.25 mg, 1.25 mg, Intravitreal, , Rennis ChrisZamora, Brian, MD, 1.25 mg at 01/11/18 1502  Current Outpatient Medications:  .  acetaminophen (TYLENOL) 500 MG tablet, Take 500 mg by mouth daily as needed for moderate pain., Disp: , Rfl:  .  albuterol (PROVENTIL HFA;VENTOLIN HFA) 108 (90 BASE) MCG/ACT inhaler, Inhale 1-2 puffs into the lungs every 6 (six) hours as needed for wheezing or shortness of breath., Disp: , Rfl:  .  amLODipine (NORVASC) 10 MG tablet, Take 1  tablet (10 mg total) by mouth daily., Disp: 30 tablet, Rfl: 0 .  amLODipine (NORVASC) 5 MG tablet, Take 5 mg by mouth daily., Disp: , Rfl: 2 .  amoxicillin-clavulanate (AUGMENTIN) 875-125 MG tablet, Take 1 tablet by mouth every 12 (twelve) hours for 7 days., Disp: 14 tablet, Rfl: 0 .  benzonatate (TESSALON) 100 MG capsule, Take 1 capsule (100 mg total) by mouth every 8 (eight) hours., Disp: 60 capsule, Rfl: 0 .  benzonatate (TESSALON) 100 MG capsule, Take 1-2 capsules (100-200 mg total) by mouth 3 (three) times daily as needed., Disp: 60 capsule, Rfl: 0 .  gabapentin (NEURONTIN) 800 MG tablet, Take 800 mg by mouth 3 (three) times daily. , Disp: , Rfl:  .  hydrOXYzine (ATARAX/VISTARIL) 25 MG tablet, Take 25 mg by mouth 3 (three) times daily as needed. for anxiety, Disp: , Rfl: 0 .  ibuprofen (ADVIL,MOTRIN) 600 MG tablet, Take 1 tablet (600 mg total) by mouth every 6 (six) hours as needed., Disp: 30 tablet, Rfl: 0 .  insulin aspart protamine- aspart (NOVOLOG MIX 70/30) (70-30) 100 UNIT/ML injection, Inject 6 Units into the skin 2 (two) times daily with a meal., Disp: , Rfl:  .  insulin glargine (LANTUS) 100 unit/mL SOPN, Inject 60 Units into the skin 2 (two) times daily. , Disp: , Rfl:  .  levothyroxine (SYNTHROID, LEVOTHROID) 50 MCG tablet, Take 50 mcg by mouth daily. , Disp: , Rfl: 3 .  losartan (COZAAR) 25 MG tablet, Take 25 mg by mouth daily. , Disp: , Rfl: 0 .  losartan (COZAAR) 50  MG tablet, Take 1 tablet (50 mg total) by mouth daily., Disp: 30 tablet, Rfl: 0 .  ondansetron (ZOFRAN ODT) 4 MG disintegrating tablet, 4mg  ODT q4 hours prn nausea/vomit, Disp: 10 tablet, Rfl: 0 .  ondansetron (ZOFRAN) 4 MG tablet, Take 4 mg by mouth daily as needed for nausea or vomiting., Disp: , Rfl:  .  promethazine-dextromethorphan (PROMETHAZINE-DM) 6.25-15 MG/5ML syrup, Take 5 mLs by mouth 3 (three) times daily as needed for cough., Disp: 100 mL, Rfl: 0 .  tenofovir (VIREAD) 300 MG tablet, Take 300 mg by mouth  daily., Disp: , Rfl: 1 .  tiZANidine (ZANAFLEX) 4 MG tablet, Take 4 mg by mouth 3 (three) times daily as needed for muscle spasms. , Disp: , Rfl:  .  triamcinolone cream (KENALOG) 0.1 %, Apply 1 application topically 3 (three) times daily., Disp: , Rfl: 0 .  venlafaxine XR (EFFEXOR-XR) 37.5 MG 24 hr capsule, Take 37.5 mg by mouth daily., Disp: , Rfl: 3 .  zolpidem (AMBIEN) 10 MG tablet, Take 10 mg by mouth at bedtime as needed for sleep. , Disp: , Rfl: 0 .  ZOVIRAX 5 %, Apply 1 application topically 3 (three) times daily. , Disp: , Rfl: 0   No Known Allergies  Past Medical History:  Diagnosis Date  . COPD (chronic obstructive pulmonary disease) (HCC)   . Depression   . Diabetes mellitus without complication (HCC)   . Heart murmur   . Hepatic cirrhosis (HCC)   . Hypertension   . Obesity   . Thyroid disease      Past Surgical History:  Procedure Laterality Date  . CESAREAN SECTION    . ESOPHAGOGASTRODUODENOSCOPY (EGD) WITH PROPOFOL N/A 09/01/2017   Procedure: ESOPHAGOGASTRODUODENOSCOPY (EGD) WITH PROPOFOL;  Surgeon: Kathi Der, MD;  Location: [facility];  Service: Gastroenterology;  Laterality: N/A;  . THYROIDECTOMY     Review of Systems  Constitutional: Positive for chills, fever and malaise/fatigue.  HENT: Positive for congestion, ear pain (right-sided) and sore throat (mild). Negative for ear discharge and sinus pain.   Eyes: Negative for blurred vision (no changes in vision) and double vision.  Respiratory: Positive for cough, shortness of breath and wheezing.   Cardiovascular: Positive for chest pain.  Gastrointestinal: Positive for diarrhea (loose stools) and nausea. Negative for abdominal pain, blood in stool and vomiting.  Genitourinary: Negative for dysuria and hematuria.  Musculoskeletal: Positive for myalgias.  Skin: Negative for itching and rash.  Neurological: Negative for dizziness, weakness and headaches.  Psychiatric/Behavioral: Negative for depression and  substance abuse. The patient does not have insomnia.    Objective:   Vitals: BP (!) 166/78 (BP Location: Left Arm) Comment (BP Location): large cuff  Pulse 96   Temp (!) 102.8 F (39.3 C) (Oral)   Resp (!) 24   SpO2 100%   BP Readings from Last 3 Encounters:  01/18/18 (!) 166/78  01/12/18 (!) 176/107  12/29/17 (!) 158/88   Physical Exam Constitutional:      General: She is not in acute distress.    Appearance: Normal appearance. She is well-developed. She is ill-appearing. She is not toxic-appearing or diaphoretic.  HENT:     Head: Normocephalic and atraumatic.     Right Ear: Tympanic membrane and ear canal normal. No drainage or tenderness. No middle ear effusion. Tympanic membrane is not erythematous.     Left Ear: Tympanic membrane and ear canal normal. No drainage or tenderness.  No middle ear effusion. Tympanic membrane is not erythematous.  Nose: Nose normal. No congestion or rhinorrhea.     Mouth/Throat:     Mouth: Mucous membranes are moist. No oral lesions.     Pharynx: Oropharynx is clear. No pharyngeal swelling, oropharyngeal exudate, posterior oropharyngeal erythema or uvula swelling.     Tonsils: No tonsillar exudate or tonsillar abscesses.  Eyes:     General: No scleral icterus.       Right eye: No discharge.        Left eye: No discharge.     Extraocular Movements: Extraocular movements intact.     Right eye: Normal extraocular motion.     Left eye: Normal extraocular motion.     Conjunctiva/sclera: Conjunctivae normal.     Pupils: Pupils are equal, round, and reactive to light.  Neck:     Musculoskeletal: Normal range of motion and neck supple.  Cardiovascular:     Rate and Rhythm: Normal rate and regular rhythm.     Pulses: Normal pulses.     Heart sounds: Normal heart sounds. No murmur. No friction rub. No gallop.   Pulmonary:     Effort: Pulmonary effort is normal. No respiratory distress.     Breath sounds: Normal breath sounds. No stridor. No  wheezing, rhonchi or rales.  Lymphadenopathy:     Cervical: No cervical adenopathy.  Skin:    General: Skin is warm and dry.     Findings: No rash.  Neurological:     General: No focal deficit present.     Mental Status: She is alert and oriented to person, place, and time.  Psychiatric:        Mood and Affect: Mood normal.        Behavior: Behavior normal.        Thought Content: Thought content normal.    Dg Chest 2 View  Result Date: 01/18/2018 CLINICAL DATA:  Fever and chills EXAM: CHEST - 2 VIEW COMPARISON:  11/18/2017 FINDINGS: Cardiac shadows within normal limits. Previously seen patchy infiltrates have resolved in the interval. No sizable effusion is noted. No acute bony abnormality is seen. IMPRESSION: No acute abnormality noted. Electronically Signed   By: Alcide CleverMark  Lukens M.D.   On: 01/18/2018 14:47    Assessment and Plan :   Cough  Chills  Fever, unspecified  Essential hypertension  Elevated blood pressure reading  Type 2 diabetes mellitus without complication, with long-term current use of insulin (HCC)  Morbid obesity (HCC)  Case precepted with Dr. Milus GlazierLauenstein, will use supportive care. Chest x-ray is negative. Strict ER precautions given for her HTN, COPD, cough, fever. Counseled patient on potential for adverse effects with medications prescribed today, patient verbalized understanding. Return-to-clinic precautions discussed, patient verbalized understanding.    Wallis BambergMani, Rohen Kimes, PA-C 01/18/18 1521

## 2018-01-18 NOTE — ED Triage Notes (Signed)
Pt presents with complaints of cough, fever, chills and body aches x 3 days.

## 2018-02-09 ENCOUNTER — Encounter (INDEPENDENT_AMBULATORY_CARE_PROVIDER_SITE_OTHER): Payer: Medicaid Other | Admitting: Ophthalmology

## 2018-02-22 NOTE — Progress Notes (Signed)
[facility] - Clinic Note  02/23/2018     CHIEF COMPLAINT Patient presents for Retina Follow Up   HISTORY OF PRESENT ILLNESS: Sheila Gibson is a 48 y.o. female who presents to the clinic today for:   HPI    Retina Follow Up    Patient presents with  Diabetic Retinopathy.  In both eyes.  This started 4 months ago.  Severity is mild.  Since onset it is stable.  I, the attending physician,  performed the HPI with the patient and updated documentation appropriately.          Comments    F/U NPDR OU. Patient states her vision "has been good", denies flashes and floaters.Patient states she had pain os few days ago, she applied ice pack with relief, denies pain today.Patient ready for tx today if indicted       Last edited by Rennis Chris, MD on 02/23/2018 10:23 AM. (History)    pt states her vision is staying the same, she states she had to reschedule her last appt and missed an injection, she states her left eye has been hurting a little, so she put some ice on it and that helped  Referring physician: No referring provider defined for this encounter.  HISTORICAL INFORMATION:   Selected notes from the MEDICAL RECORD NUMBER Referred by Dr. Aura Camps for concern of PDR and CSME OU LEE: 06.24.19 Kirk Ruths) [BCVA: OD: 20/60-2 OS: 20/25] Ocular Hx-DME OU, PDR OU, cataract OU PMH-DM (taking Lantus and Novolog), Thyroid disease, HTN    CURRENT MEDICATIONS: No current outpatient medications on file. (Ophthalmic Drugs)   No current facility-administered medications for this visit.  (Ophthalmic Drugs)   Current Outpatient Medications (Other)  Medication Sig  . acetaminophen (TYLENOL) 500 MG tablet Take 500 mg by mouth daily as needed for moderate pain.  Marland Kitchen albuterol (PROVENTIL HFA;VENTOLIN HFA) 108 (90 BASE) MCG/ACT inhaler Inhale 1-2 puffs into the lungs every 6 (six) hours as needed for wheezing or shortness of breath.  Marland Kitchen amLODipine (NORVASC) 5 MG  tablet Take 5 mg by mouth daily.  . benzonatate (TESSALON) 100 MG capsule Take 1 capsule (100 mg total) by mouth every 8 (eight) hours.  . benzonatate (TESSALON) 100 MG capsule Take 1-2 capsules (100-200 mg total) by mouth 3 (three) times daily as needed.  . gabapentin (NEURONTIN) 800 MG tablet Take 800 mg by mouth 3 (three) times daily.   . hydrOXYzine (ATARAX/VISTARIL) 25 MG tablet Take 25 mg by mouth 3 (three) times daily as needed. for anxiety  . ibuprofen (ADVIL,MOTRIN) 600 MG tablet Take 1 tablet (600 mg total) by mouth every 6 (six) hours as needed.  . insulin aspart protamine- aspart (NOVOLOG MIX 70/30) (70-30) 100 UNIT/ML injection Inject 6 Units into the skin 2 (two) times daily with a meal.  . insulin glargine (LANTUS) 100 unit/mL SOPN Inject 60 Units into the skin 2 (two) times daily.   Marland Kitchen levothyroxine (SYNTHROID, LEVOTHROID) 50 MCG tablet Take 50 mcg by mouth daily.   Marland Kitchen losartan (COZAAR) 100 MG tablet Take 1 tablet (100 mg total) by mouth daily.  . ondansetron (ZOFRAN ODT) 4 MG disintegrating tablet 4mg  ODT q4 hours prn nausea/vomit  . ondansetron (ZOFRAN) 4 MG tablet Take 4 mg by mouth daily as needed for nausea or vomiting.  . promethazine-dextromethorphan (PROMETHAZINE-DM) 6.25-15 MG/5ML syrup Take 5 mLs by mouth 3 (three) times daily as needed for cough.  . tenofovir (VIREAD) 300 MG tablet Take 300 mg  by mouth daily.  Marland Kitchen tiZANidine (ZANAFLEX) 4 MG tablet Take 4 mg by mouth 3 (three) times daily as needed for muscle spasms.   Marland Kitchen triamcinolone cream (KENALOG) 0.1 % Apply 1 application topically 3 (three) times daily.  Marland Kitchen venlafaxine XR (EFFEXOR-XR) 37.5 MG 24 hr capsule Take 37.5 mg by mouth daily.  Marland Kitchen zolpidem (AMBIEN) 10 MG tablet Take 10 mg by mouth at bedtime as needed for sleep.   Marland Kitchen ZOVIRAX 5 % Apply 1 application topically 3 (three) times daily.   Marland Kitchen amLODipine (NORVASC) 10 MG tablet Take 1 tablet (10 mg total) by mouth daily.   Current Facility-Administered Medications (Other)   Medication Route  . Bevacizumab (AVASTIN) SOLN 1.25 mg Intravitreal  . Bevacizumab (AVASTIN) SOLN 1.25 mg Intravitreal  . Bevacizumab (AVASTIN) SOLN 1.25 mg Intravitreal  . Bevacizumab (AVASTIN) SOLN 1.25 mg Intravitreal  . Bevacizumab (AVASTIN) SOLN 1.25 mg Intravitreal  . Bevacizumab (AVASTIN) SOLN 1.25 mg Intravitreal  . Bevacizumab (AVASTIN) SOLN 1.25 mg Intravitreal  . Bevacizumab (AVASTIN) SOLN 1.25 mg Intravitreal  . Bevacizumab (AVASTIN) SOLN 1.25 mg Intravitreal      REVIEW OF SYSTEMS: ROS    Positive for: Endocrine, Eyes   Negative for: Constitutional, Gastrointestinal, Neurological, Skin, Genitourinary, Musculoskeletal, HENT, Cardiovascular, Respiratory, Psychiatric, Allergic/Imm, Heme/Lymph   Last edited by Eldridge Scot, LPN on 1/61/0960  9:35 AM. (History)       ALLERGIES No Known Allergies  PAST MEDICAL HISTORY Past Medical History:  Diagnosis Date  . COPD (chronic obstructive pulmonary disease) (HCC)   . Depression   . Diabetes mellitus without complication (HCC)   . Heart murmur   . Hepatic cirrhosis (HCC)   . Hypertension   . Obesity   . Thyroid disease    Past Surgical History:  Procedure Laterality Date  . CESAREAN SECTION    . ESOPHAGOGASTRODUODENOSCOPY (EGD) WITH PROPOFOL N/A 09/01/2017   Procedure: ESOPHAGOGASTRODUODENOSCOPY (EGD) WITH PROPOFOL;  Surgeon: Kathi Der, MD;  Location: [facility];  Service: Gastroenterology;  Laterality: N/A;  . THYROIDECTOMY      FAMILY HISTORY Family History  Problem Relation Age of Onset  . Cancer Mother   . Hypertension Other     SOCIAL HISTORY Social History   Tobacco Use  . Smoking status: Never Smoker  . Smokeless tobacco: Never Used  Substance Use Topics  . Alcohol use: Not Currently  . Drug use: No         OPHTHALMIC EXAM:  Base Eye Exam    Visual Acuity (Snellen - Linear)      Right Left   Dist [place] 20/60 -2 20/40 +2   Dist ph [place] 20/30 -1 20/25 -1       Tonometry  (Tonopen, 9:45 AM)      Right Left   Pressure 18 15       Pupils      Dark Light Shape React APD   Right 4 3 Round Brisk None   Left 4 3 Round Brisk None       Visual Fields (Counting fingers)      Left Right    Full Full       Extraocular Movement      Right Left    Full, Ortho Full, Ortho       Neuro/Psych    Oriented x3:  Yes   Mood/Affect:  Normal       Dilation    Both eyes:  1.0% Mydriacyl, 2.5% Phenylephrine @ 9:45 AM  Slit Lamp and Fundus Exam    Slit Lamp Exam      Right Left   Lids/Lashes Dermatochalasis - upper lid Dermatochalasis - upper lid   Conjunctiva/Sclera Melanosis Melanosis   Cornea Arcus, 2+ diffuse Punctate epithelial erosions Arcus, 2+ diffuse Punctate epithelial erosions   Anterior Chamber Deep and quiet Deep and quiet   Iris Round and dilated Round and dilated   Lens 1+ Nuclear sclerosis, 2+ Cortical cataract 1+ Nuclear sclerosis, 2+ Cortical cataract   Vitreous Trace Vitreous syneresis Trace Vitreous syneresis       Fundus Exam      Right Left   Disc Pink and Sharp Pink and Sharp   C/D Ratio 0.4 0.5   Macula Blunted foveal reflex, interval improvement in central edema/IRF, parafoveal exudates SN fovea, scattered IRH/CWS Good foveal reflex, cluster of Microaneurysms and exudates superior to fovea with +edema -- improved, scattered Intraretinal hemorrhage, scattered DBH, mild focal laser scars superior to fovea, interval development of shallow SRF centrally   Vessels Dilated and Tortuous veins, +beading -- slight improvement, Copper wiring Dilated and Tortuous veins, Copper wiring   Periphery Attached, +IRH, +CWS -- scattered surrounding disc, scattered posterior DBH Attached, 360 IRH and CWS superiorly and around nerve, Cotton wool spots and DBH nasal to fovea -- improving          IMAGING AND PROCEDURES  Imaging and Procedures for @TODAY @  OCT, Retina - OU - Both Eyes       Right Eye Quality was good. Central Foveal  Thickness: 212. Progression has improved. Findings include intraretinal fluid, no SRF, outer retinal atrophy, normal foveal contour (interval improvement of IRF).   Left Eye Quality was good. Central Foveal Thickness: 315. Progression has worsened. Findings include intraretinal fluid, abnormal foveal contour, subretinal fluid (Interval increase in IRF, interval development of subfoveal SRF).   Notes *Images captured and stored on drive  Diagnosis / Impression:  OD: central DME -- interval improvement in IRF OS: +DME superior to fovea -- Interval increase in IRF, interval development of subfoveal SRF  Clinical management:  See below  Abbreviations: NFP - Normal foveal profile. CME - cystoid macular edema. PED - pigment epithelial detachment. IRF - intraretinal fluid. SRF - subretinal fluid. EZ - ellipsoid zone. ERM - epiretinal membrane. ORA - outer retinal atrophy. ORT - outer retinal tubulation. SRHM - subretinal hyper-reflective material         Intravitreal Injection, Pharmacologic Agent - OD - Right Eye       Time Out 02/23/2018. 10:55 AM. Confirmed correct patient, procedure, site, and patient consented.   Anesthesia Topical anesthesia was used. Anesthetic medications included Lidocaine 2%, Proparacaine 0.5%.   Procedure Preparation included 5% betadine to ocular surface. A supplied needle was used.   Injection:  1.25 mg Bevacizumab (AVASTIN) SOLN   NDC: 38329-191-66, Lot: [id number] , Expiration date: 05/07/2018   Route: Intravitreal, Site: Right Eye, Waste: 0 mL  Post-op Post injection exam found visual acuity of at least counting fingers. The patient tolerated the procedure well. There were no complications. The patient received written and verbal post procedure care education.        Intravitreal Injection, Pharmacologic Agent - OS - Left Eye       Time Out 02/23/2018. 10:55 AM. Confirmed correct patient, procedure, site, and patient consented.    Anesthesia Topical anesthesia was used. Anesthetic medications included Lidocaine 2%, Proparacaine 0.5%.   Procedure Preparation included 5% betadine to ocular surface, eyelid speculum. A supplied needle was used.  Injection:  1.25 mg Bevacizumab (AVASTIN) SOLN   NDC: 70360-001-02   Route: Intravitreal, Site: Left Eye, Waste: 0 mL  Post-op Post injection exam found visual acuity of at least counting fingers. The patient tolerated the procedure well. There were no complications. The patient received written and verbal post procedure care education.                 ASSESSMENT/PLAN:    ICD-10-CM   1. Severe nonproliferative diabetic retinopathy of both eyes with macular edema associated with type 2 diabetes mellitus (HCC) U27.2536 Intravitreal Injection, Pharmacologic Agent - OD - Right Eye    Intravitreal Injection, Pharmacologic Agent - OS - Left Eye  2. Retinal edema H35.81 OCT, Retina - OU - Both Eyes  3. Essential hypertension I10   4. Hypertensive retinopathy of both eyes H35.033   5. 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 Severe Non-proliferative diabetic retinopathy, OU - exam with extensive 360 IRH, CWS and venous beading OU - initial FA shows late leaking MAs OU, No NV - OCT shows diabetic macular edema, OU (OD > OS, OS non-central) - S/P IVA OD #1 (07.18.19), #2 (08.14.19), #3 (09.11.19), #4 (10.14.19), #5 (11.11.19), #6 (12.09.19), #7 (01.06.20) - S/P IVA OS #1 (12.09.19),  #2 (01.06.20) - S/P focal laser OS (09.25.19) - OCT shows interval decrease in IRF / DME OD, but interval increase in IRF/SRF OS - BCVA stable at 20/30 OD, 20/25 OS - recommend IVA OU today 02.18.20, OD #8 OS, #3 - pt wishes to proceed - RBA of procedure discussed, questions answered - informed consent obtained and signed - see procedure note - f/u in 4 weeks -- DFE/OCT/possible injxn vs focal laser  3,4. Hypertensive retinopathy OU - discussed importance  of tight BP control - monitor  5. Combined form age related cataract OU-  - The symptoms of cataract, surgical options, and treatments and risks were discussed with patient. - discussed diagnosis and progression - not yet visually significant - monitor for now  Ophthalmic Meds Ordered this visit:  No orders of the defined types were placed in this encounter.     Return for f/u 4-6 weeks NPDR OU, DFE, OCT.  There are no Patient Instructions on file for this visit.  This document serves as a record of services personally performed by Karie Chimera, MD, PhD. It was created on their behalf by Laurian Brim, OA, an ophthalmic assistant. The creation of this record is the provider's dictation and/or activities during the visit.    Electronically signed by: Laurian Brim, OA  02.17.2020 1:14 PM    Karie Chimera, M.D., Ph.D. Diseases & Surgery of the Retina and Vitreous [facility]  I have reviewed the above documentation for accuracy and completeness, and I agree with the above. Karie Chimera, M.D., Ph.D. 02/23/18 1:14 PM   Abbreviations: M myopia (nearsighted); A astigmatism; H hyperopia (farsighted); P presbyopia; Mrx spectacle prescription;  CTL contact lenses; OD right eye; OS left eye; OU both eyes  XT exotropia; ET esotropia; PEK punctate epithelial keratitis; PEE punctate epithelial erosions; DES dry eye syndrome; MGD meibomian gland dysfunction; ATs artificial tears; PFAT's preservative free artificial tears; NSC nuclear sclerotic cataract; PSC posterior subcapsular cataract; ERM epi-retinal membrane; PVD posterior vitreous detachment; RD retinal detachment; DM diabetes mellitus; DR diabetic retinopathy; NPDR non-proliferative diabetic retinopathy; PDR proliferative diabetic retinopathy; CSME clinically significant macular edema; DME diabetic macular edema; dbh dot blot hemorrhages; CWS  cotton wool spot; POAG primary open angle glaucoma; C/D cup-to-disc  ratio; HVF humphrey visual field; GVF goldmann visual field; OCT optical coherence tomography; IOP intraocular pressure; BRVO Branch retinal vein occlusion; CRVO central retinal vein occlusion; CRAO central retinal artery occlusion; BRAO branch retinal artery occlusion; RT retinal tear; SB scleral buckle; PPV pars plana vitrectomy; VH Vitreous hemorrhage; PRP panretinal laser photocoagulation; IVK intravitreal kenalog; VMT vitreomacular traction; MH Macular hole;  NVD neovascularization of the disc; NVE neovascularization elsewhere; AREDS age related eye disease study; ARMD age related macular degeneration; POAG primary open angle glaucoma; EBMD epithelial/anterior basement membrane dystrophy; ACIOL anterior chamber intraocular lens; IOL intraocular lens; PCIOL posterior chamber intraocular lens; Phaco/IOL phacoemulsification with intraocular lens placement; PRK photorefractive keratectomy; LASIK laser assisted in situ keratomileusis; HTN hypertension; DM diabetes mellitus; COPD chronic obstructive pulmonary disease

## 2018-02-23 ENCOUNTER — Ambulatory Visit (INDEPENDENT_AMBULATORY_CARE_PROVIDER_SITE_OTHER): Payer: Medicaid Other | Admitting: Ophthalmology

## 2018-02-23 ENCOUNTER — Encounter (INDEPENDENT_AMBULATORY_CARE_PROVIDER_SITE_OTHER): Payer: Self-pay | Admitting: Ophthalmology

## 2018-02-23 DIAGNOSIS — I10 Essential (primary) hypertension: Secondary | ICD-10-CM | POA: Diagnosis not present

## 2018-02-23 DIAGNOSIS — H3581 Retinal edema: Secondary | ICD-10-CM | POA: Diagnosis not present

## 2018-02-23 DIAGNOSIS — E113413 Type 2 diabetes mellitus with severe nonproliferative diabetic retinopathy with macular edema, bilateral: Secondary | ICD-10-CM

## 2018-02-23 DIAGNOSIS — H35033 Hypertensive retinopathy, bilateral: Secondary | ICD-10-CM | POA: Diagnosis not present

## 2018-02-23 DIAGNOSIS — H25813 Combined forms of age-related cataract, bilateral: Secondary | ICD-10-CM

## 2018-02-23 MED ORDER — BEVACIZUMAB CHEMO INJECTION 1.25MG/0.05ML SYRINGE FOR KALEIDOSCOPE
1.2500 mg | INTRAVITREAL | Status: AC
  Administered 2018-02-23: 1.25 mg via INTRAVITREAL

## 2018-03-21 NOTE — Progress Notes (Signed)
[facility] - Clinic Note  03/23/2018     CHIEF COMPLAINT Patient presents for Retina Evaluation   HISTORY OF PRESENT ILLNESS: Sheila Gibson is a 48 y.o. female who presents to the clinic today for:   HPI    Retina Evaluation    In both eyes.  This started months ago.  Duration of months.  Context:  distance vision and near vision.  I, the attending physician,  performed the HPI with the patient and updated documentation appropriately.          Comments    4 week eval (NPDR OU).  Patient states her vision is stable.  She has occasional pain in both eyes that comes and goes.  She denies new or worsening floaters or flashes of light OU.       Last edited by Rennis Chris, MD on 03/23/2018 12:49 PM. (History)    pt states she is having a little pain in her right eye, she says the vision in her left eye is "pretty good"  Referring physician: No referring provider defined for this encounter.  HISTORICAL INFORMATION:   Selected notes from the MEDICAL RECORD NUMBER Referred by Dr. Aura Camps for concern of PDR and CSME OU LEE: 06.24.19 Kirk Ruths) [BCVA: OD: 20/60-2 OS: 20/25] Ocular Hx-DME OU, PDR OU, cataract OU PMH-DM (taking Lantus and Novolog), Thyroid disease, HTN    CURRENT MEDICATIONS: No current outpatient medications on file. (Ophthalmic Drugs)   No current facility-administered medications for this visit.  (Ophthalmic Drugs)   Current Outpatient Medications (Other)  Medication Sig  . acetaminophen (TYLENOL) 500 MG tablet Take 500 mg by mouth daily as needed for moderate pain.  Marland Kitchen albuterol (PROVENTIL HFA;VENTOLIN HFA) 108 (90 BASE) MCG/ACT inhaler Inhale 1-2 puffs into the lungs every 6 (six) hours as needed for wheezing or shortness of breath.  Marland Kitchen amLODipine (NORVASC) 10 MG tablet Take 1 tablet (10 mg total) by mouth daily.  Marland Kitchen amLODipine (NORVASC) 5 MG tablet Take 5 mg by mouth daily.  . benzonatate (TESSALON) 100 MG capsule Take 1  capsule (100 mg total) by mouth every 8 (eight) hours.  . benzonatate (TESSALON) 100 MG capsule Take 1-2 capsules (100-200 mg total) by mouth 3 (three) times daily as needed.  . gabapentin (NEURONTIN) 800 MG tablet Take 800 mg by mouth 3 (three) times daily.   . hydrOXYzine (ATARAX/VISTARIL) 25 MG tablet Take 25 mg by mouth 3 (three) times daily as needed. for anxiety  . ibuprofen (ADVIL,MOTRIN) 600 MG tablet Take 1 tablet (600 mg total) by mouth every 6 (six) hours as needed.  . insulin aspart protamine- aspart (NOVOLOG MIX 70/30) (70-30) 100 UNIT/ML injection Inject 6 Units into the skin 2 (two) times daily with a meal.  . insulin glargine (LANTUS) 100 unit/mL SOPN Inject 60 Units into the skin 2 (two) times daily.   Marland Kitchen levothyroxine (SYNTHROID, LEVOTHROID) 50 MCG tablet Take 50 mcg by mouth daily.   Marland Kitchen losartan (COZAAR) 100 MG tablet Take 1 tablet (100 mg total) by mouth daily.  . ondansetron (ZOFRAN ODT) 4 MG disintegrating tablet 4mg  ODT q4 hours prn nausea/vomit  . ondansetron (ZOFRAN) 4 MG tablet Take 4 mg by mouth daily as needed for nausea or vomiting.  . promethazine-dextromethorphan (PROMETHAZINE-DM) 6.25-15 MG/5ML syrup Take 5 mLs by mouth 3 (three) times daily as needed for cough.  . tenofovir (VIREAD) 300 MG tablet Take 300 mg by mouth daily.  Marland Kitchen tiZANidine (ZANAFLEX) 4 MG tablet Take  4 mg by mouth 3 (three) times daily as needed for muscle spasms.   Marland Kitchen triamcinolone cream (KENALOG) 0.1 % Apply 1 application topically 3 (three) times daily.  Marland Kitchen venlafaxine XR (EFFEXOR-XR) 37.5 MG 24 hr capsule Take 37.5 mg by mouth daily.  Marland Kitchen zolpidem (AMBIEN) 10 MG tablet Take 10 mg by mouth at bedtime as needed for sleep.   Marland Kitchen ZOVIRAX 5 % Apply 1 application topically 3 (three) times daily.    Current Facility-Administered Medications (Other)  Medication Route  . Bevacizumab (AVASTIN) SOLN 1.25 mg Intravitreal  . Bevacizumab (AVASTIN) SOLN 1.25 mg Intravitreal  . Bevacizumab (AVASTIN) SOLN 1.25 mg  Intravitreal  . Bevacizumab (AVASTIN) SOLN 1.25 mg Intravitreal  . Bevacizumab (AVASTIN) SOLN 1.25 mg Intravitreal  . Bevacizumab (AVASTIN) SOLN 1.25 mg Intravitreal  . Bevacizumab (AVASTIN) SOLN 1.25 mg Intravitreal  . Bevacizumab (AVASTIN) SOLN 1.25 mg Intravitreal  . Bevacizumab (AVASTIN) SOLN 1.25 mg Intravitreal  . Bevacizumab (AVASTIN) SOLN 1.25 mg Intravitreal  . Bevacizumab (AVASTIN) SOLN 1.25 mg Intravitreal      REVIEW OF SYSTEMS: ROS    Positive for: Endocrine, Eyes   Negative for: Constitutional, Gastrointestinal, Neurological, Skin, Genitourinary, Musculoskeletal, HENT, Cardiovascular, Respiratory, Psychiatric, Allergic/Imm, Heme/Lymph   Last edited by Corrinne Eagle on 03/23/2018  8:57 AM. (History)       ALLERGIES No Known Allergies  PAST MEDICAL HISTORY Past Medical History:  Diagnosis Date  . COPD (chronic obstructive pulmonary disease) (HCC)   . Depression   . Diabetes mellitus without complication (HCC)   . Heart murmur   . Hepatic cirrhosis (HCC)   . Hypertension   . Obesity   . Thyroid disease    Past Surgical History:  Procedure Laterality Date  . CESAREAN SECTION    . ESOPHAGOGASTRODUODENOSCOPY (EGD) WITH PROPOFOL N/A 09/01/2017   Procedure: ESOPHAGOGASTRODUODENOSCOPY (EGD) WITH PROPOFOL;  Surgeon: Kathi Der, MD;  Location: [facility];  Service: Gastroenterology;  Laterality: N/A;  . THYROIDECTOMY      FAMILY HISTORY Family History  Problem Relation Age of Onset  . Cancer Mother   . Hypertension Other     SOCIAL HISTORY Social History   Tobacco Use  . Smoking status: Never Smoker  . Smokeless tobacco: Never Used  Substance Use Topics  . Alcohol use: Not Currently  . Drug use: No         OPHTHALMIC EXAM:  Base Eye Exam    Visual Acuity (Snellen - Linear)      Right Left   Dist [place] 20/70 -2 20/70 +2   Dist ph  20/40 +2 20/30 -2       Tonometry (Tonopen, 9:01 AM)      Right Left   Pressure 13 11        Pupils      Pupils Dark Light Shape React APD   Right PERRL 2 1 Round Minimal 0   Left PERRL 2 1 Round Minimal 0       Neuro/Psych    Oriented x3:  Yes   Mood/Affect:  Normal       Dilation    Both eyes:  1.0% Mydriacyl, 2.5% Phenylephrine @ 9:01 AM        Slit Lamp and Fundus Exam    Slit Lamp Exam      Right Left   Lids/Lashes Dermatochalasis - upper lid Dermatochalasis - upper lid   Conjunctiva/Sclera Melanosis Melanosis   Cornea Arcus, 2+ diffuse Punctate epithelial erosions Arcus, 2+ diffuse Punctate epithelial erosions  Anterior Chamber Deep and quiet Deep and quiet   Iris Round and dilated Round and dilated   Lens 1+ Nuclear sclerosis, 2+ Cortical cataract 1+ Nuclear sclerosis, 2+ Cortical cataract   Vitreous Trace Vitreous syneresis Trace Vitreous syneresis       Fundus Exam      Right Left   Disc Pink and Sharp Pink and Sharp   C/D Ratio 0.4 0.5   Macula improved foveal reflex, interval improvement in central edema/IRF, parafoveal exudates SN fovea -- improved, scattered IRH/CWS -- improved blunted foveal reflex, cluster of Microaneurysms and exudates superior to fovea with +edema -- improved, scattered Intraretinal hemorrhage, scattered DBH, mild focal laser scars superior to fovea, mild interval increase in SRF centrally   Vessels Dilated and Tortuous veins, +beading -- slight improvement, Copper wiring Dilated and Tortuous veins, Copper wiring   Periphery Attached, +IRH, +CWS -- scattered surrounding disc, scattered posterior DBH Attached, 360 IRH and CWS superiorly and around nerve, Cotton wool spots and DBH nasal to fovea -- improving          IMAGING AND PROCEDURES  Imaging and Procedures for @TODAY @  OCT, Retina - OU - Both Eyes       Right Eye Quality was good. Central Foveal Thickness: 210. Progression has improved. Findings include no SRF, outer retinal atrophy, normal foveal contour, intraretinal fluid, intraretinal hyper-reflective material  (interval improvement of IRF/IRHM SN macula).   Left Eye Quality was good. Central Foveal Thickness: 389. Progression has worsened. Findings include intraretinal fluid, abnormal foveal contour, subretinal fluid (Interval decrease in IRF, interval increase in of subfoveal SRF).   Notes *Images captured and stored on drive  Diagnosis / Impression:  OD: interval improvement in IRF OS: +DME superior to fovea -- Interval decrease in IRF, interval increase of subfoveal SRF -- ?CSR component  Clinical management:  See below  Abbreviations: NFP - Normal foveal profile. CME - cystoid macular edema. PED - pigment epithelial detachment. IRF - intraretinal fluid. SRF - subretinal fluid. EZ - ellipsoid zone. ERM - epiretinal membrane. ORA - outer retinal atrophy. ORT - outer retinal tubulation. SRHM - subretinal hyper-reflective material         Intravitreal Injection, Pharmacologic Agent - OD - Right Eye       Time Out 03/23/2018. 10:01 AM. Confirmed correct patient, procedure, site, and patient consented.   Anesthesia Topical anesthesia was used. Anesthetic medications included Proparacaine 0.5%, Lidocaine 2%.   Procedure Preparation included eyelid speculum, 5% betadine to ocular surface. A supplied needle was used.   Injection:  1.25 mg Bevacizumab (AVASTIN) SOLN   NDC: 79150-569-79, Lot: [id number] , Expiration date: 05/05/2018   Route: Intravitreal, Site: Right Eye, Waste: 0 mL  Post-op Post injection exam found visual acuity of at least counting fingers. The patient tolerated the procedure well. There were no complications. The patient received written and verbal post procedure care education.        Intravitreal Injection, Pharmacologic Agent - OS - Left Eye       Time Out 03/23/2018. 10:08 AM. Confirmed correct patient, procedure, site, and patient consented.   Anesthesia Topical anesthesia was used. Anesthetic medications included Lidocaine 2%, Proparacaine 0.5%.    Procedure Preparation included 5% betadine to ocular surface, eyelid speculum. A 30 gauge needle was used.   Injection:  1.25 mg Bevacizumab (AVASTIN) SOLN   NDC: 03/25/2018, Lot: [id number] , Expiration date: 06/18/2018   Route: Intravitreal, Site: Left Eye, Waste: 0 mL  Post-op Post injection exam found visual acuity of at  least counting fingers. The patient tolerated the procedure well. There were no complications. The patient received written and verbal post procedure care education.                 ASSESSMENT/PLAN:    ICD-10-CM   1. Severe nonproliferative diabetic retinopathy of both eyes with macular edema associated with type 2 diabetes mellitus (HCC) M57.8469 Intravitreal Injection, Pharmacologic Agent - OD - Right Eye    Intravitreal Injection, Pharmacologic Agent - OS - Left Eye    Bevacizumab (AVASTIN) SOLN 1.25 mg    Bevacizumab (AVASTIN) SOLN 1.25 mg  2. Retinal edema H35.81 OCT, Retina - OU - Both Eyes  3. Essential hypertension I10   4. Hypertensive retinopathy of both eyes H35.033   5. 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 Severe Non-proliferative diabetic retinopathy, OU - exam with extensive 360 IRH, CWS and venous beading OU - initial FA shows late leaking MAs OU, No NV - OCT shows diabetic macular edema, OU (OD > OS, OS non-central) - S/P IVA OD #1 (07.18.19), #2 (08.14.19), #3 (09.11.19), #4 (10.14.19), #5 (11.11.19), #6 (12.09.19), #7 (01.06.20), #8 (02.18.20) - S/P IVA OS #1 (12.09.19),  #2 (01.06.20), #3 (02.18.20) - S/P focal laser OS (09.25.19) - OCT shows interval decrease in IRF / DME OD, but interval increase in IRF/SRF OS -- ?CSR component - BCVA at 20/40+2 OD, 20/30-2 OS - recommend IVA OU today, #9 OD, #4 OS - pt wishes to proceed - RBA of procedure discussed, questions answered - informed consent obtained and signed - see procedure note - f/u in 4 weeks -- DFE/OCT/FA (optos, transit OS)  3,4.  Hypertensive retinopathy OU - discussed importance of tight BP control - monitor  5. Combined form age related cataract OU-  - The symptoms of cataract, surgical options, and treatments and risks were discussed with patient. - discussed diagnosis and progression - not yet visually significant - monitor for now  Ophthalmic Meds Ordered this visit:  Meds ordered this encounter  Medications  . Bevacizumab (AVASTIN) SOLN 1.25 mg  . Bevacizumab (AVASTIN) SOLN 1.25 mg      Return in about 4 weeks (around 04/20/2018) for f/u NPDR OU, DFE, OCT, FA (optos, transit OS).  There are no Patient Instructions on file for this visit.  This document serves as a record of services personally performed by Karie Chimera, MD, PhD. It was created on their behalf by Laurian Brim, OA, an ophthalmic assistant. The creation of this record is the provider's dictation and/or activities during the visit.    Electronically signed by: Laurian Brim, OA  03.15.2020 1:00 PM    Karie Chimera, M.D., Ph.D. Diseases & Surgery of the Retina and Vitreous [facility]  I have reviewed the above documentation for accuracy and completeness, and I agree with the above. Karie Chimera, M.D., Ph.D. 03/23/18 1:00 PM    Abbreviations: M myopia (nearsighted); A astigmatism; H hyperopia (farsighted); P presbyopia; Mrx spectacle prescription;  CTL contact lenses; OD right eye; OS left eye; OU both eyes  XT exotropia; ET esotropia; PEK punctate epithelial keratitis; PEE punctate epithelial erosions; DES dry eye syndrome; MGD meibomian gland dysfunction; ATs artificial tears; PFAT's preservative free artificial tears; NSC nuclear sclerotic cataract; PSC posterior subcapsular cataract; ERM epi-retinal membrane; PVD posterior vitreous detachment; RD retinal detachment; DM diabetes mellitus; DR diabetic retinopathy; NPDR non-proliferative diabetic retinopathy; PDR proliferative diabetic retinopathy; CSME  clinically significant macular edema;  DME diabetic macular edema; dbh dot blot hemorrhages; CWS cotton wool spot; POAG primary open angle glaucoma; C/D cup-to-disc ratio; HVF humphrey visual field; GVF goldmann visual field; OCT optical coherence tomography; IOP intraocular pressure; BRVO Branch retinal vein occlusion; CRVO central retinal vein occlusion; CRAO central retinal artery occlusion; BRAO branch retinal artery occlusion; RT retinal tear; SB scleral buckle; PPV pars plana vitrectomy; VH Vitreous hemorrhage; PRP panretinal laser photocoagulation; IVK intravitreal kenalog; VMT vitreomacular traction; MH Macular hole;  NVD neovascularization of the disc; NVE neovascularization elsewhere; AREDS age related eye disease study; ARMD age related macular degeneration; POAG primary open angle glaucoma; EBMD epithelial/anterior basement membrane dystrophy; ACIOL anterior chamber intraocular lens; IOL intraocular lens; PCIOL posterior chamber intraocular lens; Phaco/IOL phacoemulsification with intraocular lens placement; [place] photorefractive keratectomy; LASIK laser assisted in situ keratomileusis; HTN hypertension; DM diabetes mellitus; COPD chronic obstructive pulmonary disease

## 2018-03-23 ENCOUNTER — Ambulatory Visit (INDEPENDENT_AMBULATORY_CARE_PROVIDER_SITE_OTHER): Payer: Medicaid Other | Admitting: Ophthalmology

## 2018-03-23 ENCOUNTER — Other Ambulatory Visit: Payer: Self-pay

## 2018-03-23 ENCOUNTER — Encounter (INDEPENDENT_AMBULATORY_CARE_PROVIDER_SITE_OTHER): Payer: Self-pay | Admitting: Ophthalmology

## 2018-03-23 DIAGNOSIS — H35033 Hypertensive retinopathy, bilateral: Secondary | ICD-10-CM

## 2018-03-23 DIAGNOSIS — I10 Essential (primary) hypertension: Secondary | ICD-10-CM

## 2018-03-23 DIAGNOSIS — H25813 Combined forms of age-related cataract, bilateral: Secondary | ICD-10-CM

## 2018-03-23 DIAGNOSIS — H3581 Retinal edema: Secondary | ICD-10-CM | POA: Diagnosis not present

## 2018-03-23 DIAGNOSIS — E113413 Type 2 diabetes mellitus with severe nonproliferative diabetic retinopathy with macular edema, bilateral: Secondary | ICD-10-CM

## 2018-03-23 MED ORDER — BEVACIZUMAB CHEMO INJECTION 1.25MG/0.05ML SYRINGE FOR KALEIDOSCOPE
1.2500 mg | INTRAVITREAL | Status: AC | PRN
  Administered 2018-03-23: 1.25 mg via INTRAVITREAL

## 2018-03-31 ENCOUNTER — Ambulatory Visit (HOSPITAL_COMMUNITY)
Admission: EM | Admit: 2018-03-31 | Discharge: 2018-03-31 | Disposition: A | Discharge status: Home / Self Care | Payer: Medicaid Other | Attending: Family Medicine | Admitting: Family Medicine

## 2018-03-31 ENCOUNTER — Other Ambulatory Visit: Payer: Self-pay

## 2018-03-31 ENCOUNTER — Encounter (HOSPITAL_COMMUNITY): Payer: Self-pay | Admitting: Emergency Medicine

## 2018-03-31 DIAGNOSIS — Z3202 Encounter for pregnancy test, result negative: Secondary | ICD-10-CM | POA: Diagnosis not present

## 2018-03-31 DIAGNOSIS — N766 Ulceration of vulva: Secondary | ICD-10-CM

## 2018-03-31 DIAGNOSIS — R300 Dysuria: Secondary | ICD-10-CM | POA: Diagnosis not present

## 2018-03-31 LAB — POCT URINALYSIS DIP (DEVICE)
Bilirubin Urine: NEGATIVE
Glucose, UA: NEGATIVE mg/dL
Ketones, ur: NEGATIVE mg/dL
Nitrite: NEGATIVE
Protein, ur: 30 mg/dL — AB
Specific Gravity, Urine: 1.015 (ref 1.005–1.030)
Urobilinogen, UA: 0.2 mg/dL (ref 0.0–1.0)
pH: 6.5 (ref 5.0–8.0)

## 2018-03-31 LAB — POCT PREGNANCY, URINE: Preg Test, Ur: NEGATIVE

## 2018-03-31 MED ORDER — CLOTRIMAZOLE 1 % EX CREA: TOPICAL_CREAM | CUTANEOUS | 0 refills | Status: DC

## 2018-03-31 MED ORDER — NITROFURANTOIN MONOHYD MACRO 100 MG PO CAPS: 100.0000 mg | ORAL_CAPSULE | Freq: Two times a day (BID) | ORAL | 0 refills | Status: AC

## 2018-03-31 MED ORDER — VALACYCLOVIR HCL 1 G PO TABS: 1000.0000 mg | ORAL_TABLET | Freq: Three times a day (TID) | ORAL | 0 refills | Status: AC

## 2018-03-31 NOTE — ED Provider Notes (Signed)
[facility]    CSN: [id number] Arrival date & time: 03/31/18  1336     History   Chief Complaint Chief Complaint  Patient presents with  . Urinary Tract Infection    HPI [address] [place] is a 48 y.o. female history of DM type II, hypertension, COPD presenting today for evaluation of possible UTI.  Patient notes that she has had discomfort with urination.  She also notes that her boyfriend has seen an ulcerated area in her genital region.  She denies increased frequency or urgency.  She tried an over-the-counter yeast medicine without relief.  Notes that she has a history of both UTIs and yeast infections related to her diabetes.  She denies concern for STDs as she states that she recently was tested a couple weeks ago.  Denies any other partners/new partners.  Denies history of herpes with herself or her partner.  She is still having menstrual cycles, but irregular.  Last cycle in December/January.  HPI  Past Medical History:  Diagnosis Date  . COPD (chronic obstructive pulmonary disease) (HCC)   . Depression   . Diabetes mellitus without complication (HCC)   . Heart murmur   . Hepatic cirrhosis (HCC)   . Hypertension   . Obesity   . Thyroid disease     Patient Active Problem List   Diagnosis Date Noted  . Warts, genital 08/20/2017  . Hypertensive urgency 08/17/2017    Past Surgical History:  Procedure Laterality Date  . CESAREAN SECTION    . ESOPHAGOGASTRODUODENOSCOPY (EGD) WITH PROPOFOL N/A 09/01/2017   Procedure: ESOPHAGOGASTRODUODENOSCOPY (EGD) WITH PROPOFOL;  Surgeon: Kathi Der, MD;  Location: [facility];  Service: Gastroenterology;  Laterality: N/A;  . THYROIDECTOMY      OB History    Gravida  6   Para  5   Term  5   Preterm      AB  1   Living  4     SAB  1   TAB      Ectopic      Multiple      Live Births  4            Home Medications    Prior to Admission medications   Medication Sig Start Date End  Date Taking? Authorizing Provider  acetaminophen (TYLENOL) 500 MG tablet Take 500 mg by mouth daily as needed for moderate pain.    [provider]  albuterol (PROVENTIL HFA;VENTOLIN HFA) 108 (90 BASE) MCG/ACT inhaler Inhale 1-2 puffs into the lungs every 6 (six) hours as needed for wheezing or shortness of breath.    [provider]  clotrimazole (LOTRIMIN) 1 % cream Apply to affected area 2 times daily 03/31/18   Donie Lemelin C, PA-C  gabapentin (NEURONTIN) 800 MG tablet Take 800 mg by mouth 3 (three) times daily.     [provider]  hydrOXYzine (ATARAX/VISTARIL) 25 MG tablet Take 25 mg by mouth 3 (three) times daily as needed. for anxiety 08/05/17   [provider]  ibuprofen (ADVIL,MOTRIN) 600 MG tablet Take 1 tablet (600 mg total) by mouth every 6 (six) hours as needed. 01/12/18   Shaunika Italiano C, PA-C  insulin aspart protamine- aspart (NOVOLOG MIX 70/30) (70-30) 100 UNIT/ML injection Inject 6 Units into the skin 2 (two) times daily with a meal.    [provider]  insulin glargine (LANTUS) 100 unit/mL SOPN Inject 60 Units into the skin 2 (two) times daily.     [provider]  levothyroxine (SYNTHROID, LEVOTHROID) 50 MCG tablet Take 50 mcg by mouth daily.  07/20/17   [provider]  losartan (COZAAR) 100 MG tablet Take 1 tablet (100 mg total) by mouth daily. 01/18/18   Wallis BambergMani, Mario, PA-C  nitrofurantoin, macrocrystal-monohydrate, (MACROBID) 100 MG capsule Take 1 capsule (100 mg total) by mouth 2 (two) times daily for 5 days. 03/31/18 04/05/18  Kaya Pottenger C, PA-C  ondansetron (ZOFRAN ODT) 4 MG disintegrating tablet 4mg  ODT q4 hours prn nausea/vomit 08/27/17   Bethann BerkshireZammit, Joseph, MD  ondansetron (ZOFRAN) 4 MG tablet Take 4 mg by mouth daily as needed for nausea or vomiting.    [provider]  promethazine-dextromethorphan (PROMETHAZINE-DM) 6.25-15 MG/5ML syrup Take 5 mLs by mouth 3 (three) times daily as needed for cough.  01/18/18   Wallis BambergMani, Mario, PA-C  tenofovir (VIREAD) 300 MG tablet Take 300 mg by mouth daily. 08/03/17   [provider]  tiZANidine (ZANAFLEX) 4 MG tablet Take 4 mg by mouth 3 (three) times daily as needed for muscle spasms.     [provider]  triamcinolone cream (KENALOG) 0.1 % Apply 1 application topically 3 (three) times daily. 08/05/17   [provider]  valACYclovir (VALTREX) 1000 MG tablet Take 1 tablet (1,000 mg total) by mouth 3 (three) times daily for 14 days. 03/31/18 04/14/18  Keandria Berrocal C, PA-C  venlafaxine XR (EFFEXOR-XR) 37.5 MG 24 hr capsule Take 37.5 mg by mouth daily. 07/06/17   [provider]  zolpidem (AMBIEN) 10 MG tablet Take 10 mg by mouth at bedtime as needed for sleep.  01/23/17   [provider]  ZOVIRAX 5 % Apply 1 application topically 3 (three) times daily.  04/14/17   [provider]    Family History Family History  Problem Relation Age of Onset  . Cancer Mother   . Hypertension Other     Social History Social History   Tobacco Use  . Smoking status: Never Smoker  . Smokeless tobacco: Never Used  Substance Use Topics  . Alcohol use: Not Currently  . Drug use: No     Allergies   Patient has no known allergies.   Review of Systems Review of Systems  Constitutional: Negative for fever.  Respiratory: Negative for shortness of breath.   Cardiovascular: Negative for chest pain.  Gastrointestinal: Negative for abdominal pain, diarrhea, nausea and vomiting.  Genitourinary: Positive for dysuria and genital sores. Negative for flank pain, hematuria, menstrual problem, vaginal bleeding, vaginal discharge and vaginal pain.  Musculoskeletal: Negative for back pain.  Skin: Negative for rash.  Neurological: Negative for dizziness, light-headedness and headaches.     Physical Exam Triage Vital Signs ED Triage Vitals  Enc Vitals Group     BP 03/31/18 1412 (!) 148/89     Pulse Rate 03/31/18 1412 88      Resp 03/31/18 1412 (!) 22     Temp 03/31/18 1412 98.4 F (36.9 C)     Temp Source 03/31/18 1412 Oral     SpO2 03/31/18 1412 98 %     Weight --      Height --      Head Circumference --      Peak Flow --      Pain Score 03/31/18 1409 10     Pain Loc --      Pain Edu? --      Excl. in GC? --    No data found.  Updated Vital Signs BP (!) 148/89 (BP Location:  Left Arm) Comment (BP Location): large cuff  Pulse 88   Temp 98.4 F (36.9 C) (Oral)   Resp (!) 22   SpO2 98%   Visual Acuity Right Eye Distance:   Left Eye Distance:   Bilateral Distance:    Right Eye Near:   Left Eye Near:    Bilateral Near:     Physical Exam Vitals signs and nursing note reviewed.  Constitutional:      Appearance: She is well-developed.     Comments: No acute distress  HENT:     Head: Normocephalic and atraumatic.     Nose: Nose normal.  Eyes:     Conjunctiva/sclera: Conjunctivae normal.  Neck:     Musculoskeletal: Neck supple.  Cardiovascular:     Rate and Rhythm: Normal rate.  Pulmonary:     Effort: Pulmonary effort is normal. No respiratory distress.  Abdominal:     General: There is no distension.  Genitourinary:    Comments: Isolated flat ulcerative slightly erythematous circular lesion between clitoris and urethra, tender to touch, vagina with mild amount of white milky discharge, no cervical erythema  Right groin with area of erythema and appears slightly overall Musculoskeletal: Normal range of motion.  Skin:    General: Skin is warm and dry.  Neurological:     Mental Status: She is alert and oriented to person, place, and time.      UC Treatments / Results  Labs (all labs ordered are listed, but only abnormal results are displayed) Labs Reviewed  POCT URINALYSIS DIP (DEVICE) - Abnormal; Notable for the following components:      Result Value   Hgb urine dipstick TRACE (*)    Protein, ur 30 (*)    Leukocytes,Ua MODERATE (*)    All other components within normal  limits  URINE CULTURE  HSV CULTURE AND TYPING  POC URINE PREG, ED  POCT PREGNANCY, URINE    EKG None  Radiology No results found.  Procedures Procedures (including critical care time)  Medications Ordered in UC Medications - No data to display  Initial Impression / Assessment and Plan / UC Course  I have reviewed the triage vital signs and the nursing notes.  Pertinent labs & imaging results that were available during my care of the patient were reviewed by me and considered in my medical decision making (see chart for details).    Moderate leuks on UA, will treat for UTI with Macrobid twice daily x5 days.  Culture sent to confirm as well as check sensitivities. Lesion in genital area does not appear classic herpes, but given associated pain will swab for HSV.  Will start empiric treatment with Valtrex twice daily.  This is likely source of dysuria.  Provide clotrimazole to apply topically to also help with the yeast that may be in right groin contributing to broken skin/discoloration.  Cervicovaginal swab to check for abnormal causes of discharge.  Continue to monitor,Discussed strict return precautions. Patient verbalized understanding and is agreeable with plan.  Final Clinical Impressions(s) / UC Diagnoses   Final diagnoses:  Dysuria  Genital ulcer, female     Discharge Instructions     Urine showed evidence of infection. We are treating you with macrobid, twice daily for 5 days. Be sure to take full course. Stay hydrated- urine should be pale yellow to clear. My continue azo for relief of burning while infection is being cleared.   Based off the ulcer/lesion I swabbed you to check for herpes, we will call  with results. Begin valtrex 3 times daily for next 1-2 weeks until symptoms clear.   Please return or follow up with your primary provider if symptoms not improving with treatment. Please return sooner if you have worsening of symptoms or develop fever, nausea,  vomiting, abdominal pain, back pain, lightheadedness, dizziness.   ED Prescriptions    Medication Sig Dispense Auth. Provider   nitrofurantoin, macrocrystal-monohydrate, (MACROBID) 100 MG capsule Take 1 capsule (100 mg total) by mouth 2 (two) times daily for 5 days. 10 capsule Pola Furno C, PA-C   valACYclovir (VALTREX) 1000 MG tablet Take 1 tablet (1,000 mg total) by mouth 3 (three) times daily for 14 days. 42 tablet Saramarie Stinger C, PA-C   clotrimazole (LOTRIMIN) 1 % cream Apply to affected area 2 times daily 15 g Rovena Hearld C, PA-C     Controlled Substance Prescriptions [place] Controlled Substance Registry consulted? Not Applicable   Lew Dawes, [place] 03/31/18 1536

## 2018-03-31 NOTE — Discharge Instructions (Addendum)
Urine showed evidence of infection. We are treating you with macrobid, twice daily for 5 days. Be sure to take full course. Stay hydrated- urine should be pale yellow to clear. My continue azo for relief of burning while infection is being cleared.   Based off the ulcer/lesion I swabbed you to check for herpes, we will call with results. Begin valtrex 3 times daily for next 1-2 weeks until symptoms clear.   Please return or follow up with your primary provider if symptoms not improving with treatment. Please return sooner if you have worsening of symptoms or develop fever, nausea, vomiting, abdominal pain, back pain, lightheadedness, dizziness.

## 2018-03-31 NOTE — ED Triage Notes (Addendum)
Concerned for uti.  Reports pain with urination.  Has pictures of labia with ulcerated areas.  Onset of symptoms 2 weeks ago.  Thought to be yeast infection, used miconazole, but no relief

## 2018-04-01 LAB — URINE CULTURE

## 2018-04-01 LAB — CERVICOVAGINAL ANCILLARY ONLY
Bacterial vaginitis: POSITIVE — AB
Candida vaginitis: NEGATIVE
Chlamydia: NEGATIVE
Neisseria Gonorrhea: NEGATIVE
Trichomonas: NEGATIVE

## 2018-04-02 ENCOUNTER — Telehealth (HOSPITAL_COMMUNITY): Payer: Self-pay | Admitting: Emergency Medicine

## 2018-04-02 LAB — HSV CULTURE AND TYPING

## 2018-04-02 MED ORDER — METRONIDAZOLE 500 MG PO TABS: 500.0000 mg | ORAL_TABLET | Freq: Two times a day (BID) | ORAL | 0 refills | Status: AC

## 2018-04-02 NOTE — Telephone Encounter (Signed)
Bacterial vaginosis is positive. This was not treated at the urgent care visit.  Flagyl 500 mg BID x 7 days #14 no refills sent to patients pharmacy of choice.    Patient contacted and made aware of all results, all questions answered.  Pending HSV culture

## 2018-04-21 ENCOUNTER — Encounter (INDEPENDENT_AMBULATORY_CARE_PROVIDER_SITE_OTHER): Payer: Medicaid Other | Admitting: Ophthalmology

## 2018-04-23 ENCOUNTER — Encounter (INDEPENDENT_AMBULATORY_CARE_PROVIDER_SITE_OTHER): Payer: Medicaid Other | Admitting: Ophthalmology

## 2018-05-21 ENCOUNTER — Encounter (INDEPENDENT_AMBULATORY_CARE_PROVIDER_SITE_OTHER): Payer: Medicaid Other | Admitting: Ophthalmology

## 2018-05-25 ENCOUNTER — Other Ambulatory Visit: Payer: Self-pay

## 2018-05-25 ENCOUNTER — Encounter (INDEPENDENT_AMBULATORY_CARE_PROVIDER_SITE_OTHER): Payer: Self-pay | Admitting: Ophthalmology

## 2018-05-25 ENCOUNTER — Ambulatory Visit (INDEPENDENT_AMBULATORY_CARE_PROVIDER_SITE_OTHER): Payer: Medicaid Other | Admitting: Ophthalmology

## 2018-05-25 DIAGNOSIS — H25813 Combined forms of age-related cataract, bilateral: Secondary | ICD-10-CM

## 2018-05-25 DIAGNOSIS — I10 Essential (primary) hypertension: Secondary | ICD-10-CM

## 2018-05-25 DIAGNOSIS — E113413 Type 2 diabetes mellitus with severe nonproliferative diabetic retinopathy with macular edema, bilateral: Secondary | ICD-10-CM | POA: Diagnosis not present

## 2018-05-25 DIAGNOSIS — H3581 Retinal edema: Secondary | ICD-10-CM

## 2018-05-25 DIAGNOSIS — H35033 Hypertensive retinopathy, bilateral: Secondary | ICD-10-CM

## 2018-05-25 MED ORDER — BEVACIZUMAB CHEMO INJECTION 1.25MG/0.05ML SYRINGE FOR KALEIDOSCOPE
1.2500 mg | INTRAVITREAL | Status: AC | PRN
  Administered 2018-05-25: 17:00:00 1.25 mg via INTRAVITREAL

## 2018-05-25 MED ORDER — BEVACIZUMAB CHEMO INJECTION 1.25MG/0.05ML SYRINGE FOR KALEIDOSCOPE
1.2500 mg | INTRAVITREAL | Status: AC | PRN
  Administered 2018-05-25: 1.25 mg via INTRAVITREAL

## 2018-05-25 NOTE — Progress Notes (Signed)
[facility] - Clinic Note  05/25/2018     CHIEF COMPLAINT Patient presents for Retina Evaluation   HISTORY OF PRESENT ILLNESS: Sheila Gibson is a 48 y.o. female who presents to the clinic today for:   HPI    Retina Evaluation    In both eyes.  This started weeks ago.  Duration of weeks.  Response to treatment was mild improvement.  I, the attending physician,  performed the HPI with the patient and updated documentation appropriately.          Comments    Patient states her vision is improving.  She denies any recent eye pain or discomfort.  Patient denies any new or worsening floaters or flashes of light.       Last edited by Rennis ChrisZamora, Bowie Delia, MD on 05/25/2018  2:51 PM. (History)    f/u delayed to 9 wks instead of 4. pt states she missed her last visit bc she has a medical condition that causes her to fall a lot and she had a flare up, pt states her eyes are hurting today, she states she feels like her left eye vision is worse than last time  Referring physician: No referring provider defined for this encounter.  HISTORICAL INFORMATION:   Selected notes from the MEDICAL RECORD NUMBER Referred by Dr. Aura CampsMichael Spencer for concern of PDR and CSME OU LEE: 06.24.19 Kirk Ruths(M. Spencer) [BCVA: OD: 20/60-2 OS: 20/25] Ocular Hx-DME OU, PDR OU, cataract OU PMH-DM (taking Lantus and Novolog), Thyroid disease, HTN    CURRENT MEDICATIONS: No current outpatient medications on file. (Ophthalmic Drugs)   No current facility-administered medications for this visit.  (Ophthalmic Drugs)   Current Outpatient Medications (Other)  Medication Sig  . acetaminophen (TYLENOL) 500 MG tablet Take 500 mg by mouth daily as needed for moderate pain.  Marland Kitchen. albuterol (PROVENTIL HFA;VENTOLIN HFA) 108 (90 BASE) MCG/ACT inhaler Inhale 1-2 puffs into the lungs every 6 (six) hours as needed for wheezing or shortness of breath.  . clotrimazole (LOTRIMIN) 1 % cream Apply to affected area 2  times daily  . gabapentin (NEURONTIN) 800 MG tablet Take 800 mg by mouth 3 (three) times daily.   . hydrOXYzine (ATARAX/VISTARIL) 25 MG tablet Take 25 mg by mouth 3 (three) times daily as needed. for anxiety  . ibuprofen (ADVIL,MOTRIN) 600 MG tablet Take 1 tablet (600 mg total) by mouth every 6 (six) hours as needed.  . insulin aspart protamine- aspart (NOVOLOG MIX 70/30) (70-30) 100 UNIT/ML injection Inject 6 Units into the skin 2 (two) times daily with a meal.  . insulin glargine (LANTUS) 100 unit/mL SOPN Inject 60 Units into the skin 2 (two) times daily.   Marland Kitchen. levothyroxine (SYNTHROID, LEVOTHROID) 50 MCG tablet Take 50 mcg by mouth daily.   Marland Kitchen. losartan (COZAAR) 100 MG tablet Take 1 tablet (100 mg total) by mouth daily.  . ondansetron (ZOFRAN ODT) 4 MG disintegrating tablet 4mg  ODT q4 hours prn nausea/vomit  . ondansetron (ZOFRAN) 4 MG tablet Take 4 mg by mouth daily as needed for nausea or vomiting.  . promethazine-dextromethorphan (PROMETHAZINE-DM) 6.25-15 MG/5ML syrup Take 5 mLs by mouth 3 (three) times daily as needed for cough.  . tenofovir (VIREAD) 300 MG tablet Take 300 mg by mouth daily.  Marland Kitchen. tiZANidine (ZANAFLEX) 4 MG tablet Take 4 mg by mouth 3 (three) times daily as needed for muscle spasms.   Marland Kitchen. triamcinolone cream (KENALOG) 0.1 % Apply 1 application topically 3 (three) times daily.  Marland Kitchen. venlafaxine  XR (EFFEXOR-XR) 37.5 MG 24 hr capsule Take 37.5 mg by mouth daily.  Marland Kitchen zolpidem (AMBIEN) 10 MG tablet Take 10 mg by mouth at bedtime as needed for sleep.   Marland Kitchen ZOVIRAX 5 % Apply 1 application topically 3 (three) times daily.    Current Facility-Administered Medications (Other)  Medication Route  . Bevacizumab (AVASTIN) SOLN 1.25 mg Intravitreal  . Bevacizumab (AVASTIN) SOLN 1.25 mg Intravitreal  . Bevacizumab (AVASTIN) SOLN 1.25 mg Intravitreal  . Bevacizumab (AVASTIN) SOLN 1.25 mg Intravitreal  . Bevacizumab (AVASTIN) SOLN 1.25 mg Intravitreal  . Bevacizumab (AVASTIN) SOLN 1.25 mg  Intravitreal  . Bevacizumab (AVASTIN) SOLN 1.25 mg Intravitreal  . Bevacizumab (AVASTIN) SOLN 1.25 mg Intravitreal  . Bevacizumab (AVASTIN) SOLN 1.25 mg Intravitreal  . Bevacizumab (AVASTIN) SOLN 1.25 mg Intravitreal  . Bevacizumab (AVASTIN) SOLN 1.25 mg Intravitreal      REVIEW OF SYSTEMS: ROS    Positive for: Endocrine, Eyes   Negative for: Constitutional, Gastrointestinal, Neurological, Skin, Genitourinary, Musculoskeletal, HENT, Cardiovascular, Respiratory, Psychiatric, Allergic/Imm, Heme/Lymph   Last edited by Corrinne Eagle on 05/25/2018  1:45 PM. (History)       ALLERGIES No Known Allergies  PAST MEDICAL HISTORY Past Medical History:  Diagnosis Date  . COPD (chronic obstructive pulmonary disease) (HCC)   . Depression   . Diabetes mellitus without complication (HCC)   . Heart murmur   . Hepatic cirrhosis (HCC)   . Hypertension   . Obesity   . Thyroid disease    Past Surgical History:  Procedure Laterality Date  . CESAREAN SECTION    . ESOPHAGOGASTRODUODENOSCOPY (EGD) WITH PROPOFOL N/A 09/01/2017   Procedure: ESOPHAGOGASTRODUODENOSCOPY (EGD) WITH PROPOFOL;  Surgeon: Kathi Der, MD;  Location: [facility];  Service: Gastroenterology;  Laterality: N/A;  . THYROIDECTOMY      FAMILY HISTORY Family History  Problem Relation Age of Onset  . Cancer Mother   . Hypertension Other     SOCIAL HISTORY Social History   Tobacco Use  . Smoking status: Never Smoker  . Smokeless tobacco: Never Used  Substance Use Topics  . Alcohol use: Not Currently  . Drug use: No         OPHTHALMIC EXAM:  Base Eye Exam    Visual Acuity (Snellen - Linear)      Right Left   Dist [place] 20/40 -2 20/40 -2   Dist ph [place] 20/30 -2 NI       Tonometry (Tonopen, 1:48 PM)      Right Left   Pressure 21 24       Pupils      Dark Light Shape React APD   Right 3 2 Round Brisk 0   Left 3 2 Round Brisk 0       Extraocular Movement      Right Left    Full Full        Neuro/Psych    Oriented x3:  Yes   Mood/Affect:  Normal       Dilation    Both eyes:  1.0% Mydriacyl, 2.5% Phenylephrine @ 1:48 PM        Slit Lamp and Fundus Exam    Slit Lamp Exam      Right Left   Lids/Lashes Dermatochalasis - upper lid Dermatochalasis - upper lid   Conjunctiva/Sclera Melanosis Melanosis   Cornea Arcus, 2+ diffuse Punctate epithelial erosions Arcus, 2+ diffuse Punctate epithelial erosions   Anterior Chamber Deep and quiet Deep and quiet   Iris Round and dilated Round and  dilated   Lens 1+ Nuclear sclerosis, 2+ Cortical cataract 1+ Nuclear sclerosis, 2+ Cortical cataract   Vitreous Trace Vitreous syneresis Trace Vitreous syneresis       Fundus Exam      Right Left   Disc Pink and Sharp Pink and Sharp   C/D Ratio 0.4 0.5   Macula Stably improved foveal reflex, central edema/IRF, parafoveal exudates SN fovea -- improved, scattered IRH/CWS -- improved blunted foveal reflex, cluster of Microaneurysms and exudates superior to fovea with +edema -- improved, scattered Intraretinal hemorrhage, scattered DBH, mild focal laser scars superior to fovea, mild interval increase in SRF/IRF centrally   Vessels Dilated and Tortuous veins, +beading -- slight improvement, Copper wiring Dilated and Tortuous veins, Copper wiring   Periphery Attached, +IRH, +CWS -- scattered surrounding disc, scattered posterior DBH Attached, 360 IRH and CWS superiorly and around nerve, Cotton wool spots and DBH nasal to fovea -- improving          IMAGING AND PROCEDURES  Imaging and Procedures for @  OCT, Retina - OU - Both Eyes       Right Eye Quality was good. Central Foveal Thickness: 213. Progression has worsened. Findings include no SRF, outer retinal atrophy, normal foveal contour, intraretinal fluid, intraretinal hyper-reflective material (interval increase of IRF nasal macula).   Left Eye Quality was good. Central Foveal Thickness: 430. Progression has worsened. Findings  include intraretinal fluid, abnormal foveal contour, subretinal fluid (Interval increase in IRF/SRF).   Notes *Images captured and stored on drive  Diagnosis / Impression:  OD: interval interval increase of IRF nasal macula OS: +DME superior to fovea -- Interval increase in IRF/SRF  Clinical management:  See below  Abbreviations: NFP - Normal foveal profile. CME - cystoid macular edema. PED - pigment epithelial detachment. IRF - intraretinal fluid. SRF - subretinal fluid. EZ - ellipsoid zone. ERM - epiretinal membrane. ORA - outer retinal atrophy. ORT - outer retinal tubulation. SRHM - subretinal hyper-reflective material         Intravitreal Injection, Pharmacologic Agent - OD - Right Eye       Time Out 05/25/2018. 3:01 PM. Confirmed correct patient, procedure, site, and patient consented.   Anesthesia Topical anesthesia was used. Anesthetic medications included Lidocaine 2%, Proparacaine 0.5%.   Procedure Preparation included 5% betadine to ocular surface, eyelid speculum. A 30 gauge needle was used.   Injection:  1.25 mg Bevacizumab (AVASTIN) SOLN   NDC: 16109-604-54, Lot: [id number] , Expiration date: 06/18/2018   Route: Intravitreal, Site: Right Eye, Waste: 0 mL  Post-op Post injection exam found visual acuity of at least counting fingers. The patient tolerated the procedure well. There were no complications. The patient received written and verbal post procedure care education.        Intravitreal Injection, Pharmacologic Agent - OS - Left Eye       Time Out 05/25/2018. 2:58 PM. Confirmed correct patient, procedure, site, and patient consented.   Anesthesia Topical anesthesia was used. Anesthetic medications included Lidocaine 2%, Proparacaine 0.5%.   Procedure Preparation included 5% betadine to ocular surface, eyelid speculum. A supplied needle was used.   Injection:  1.25 mg Bevacizumab (AVASTIN) SOLN   NDC: 76283-151-76, Lot: [id number] , Expiration  date: 07/28/2018   Route: Intravitreal, Site: Left Eye, Waste: 0 mL  Post-op Post injection exam found visual acuity of at least counting fingers. The patient tolerated the procedure well. There were no complications. The patient received written and verbal post procedure care education.  Fluorescein Angiography Optos (Transit OS)       Right Eye   Progression has no prior data. Early phase findings include leakage, microaneurysm. Mid/Late phase findings include leakage, microaneurysm.   Left Eye   Progression has no prior data. Early phase findings include microaneurysm, leakage. Mid/Late phase findings include leakage, microaneurysm.   Notes Images stored on drive;   Impression: Severe NPDR OU Significant vascular leakage OU                  ASSESSMENT/PLAN:    ICD-10-CM   1. Severe nonproliferative diabetic retinopathy of both eyes with macular edema associated with type 2 diabetes mellitus (HCC) Z61.0960 Intravitreal Injection, Pharmacologic Agent - OD - Right Eye    Intravitreal Injection, Pharmacologic Agent - OS - Left Eye    Bevacizumab (AVASTIN) SOLN 1.25 mg    Bevacizumab (AVASTIN) SOLN 1.25 mg  2. Retinal edema H35.81 OCT, Retina - OU - Both Eyes  3. Essential hypertension I10   4. Hypertensive retinopathy of both eyes H35.033 Fluorescein Angiography Optos (Transit OS)  5. 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 Severe Non-proliferative diabetic retinopathy, OU  - exam with extensive 360 IRH, CWS and venous beading OU  - initial FA shows late leaking MAs OU, No NV  - OCT shows diabetic macular edema, OU   - S/P IVA OD #1 (07.18.19), #2 (08.14.19), #3 (09.11.19), #4 (10.14.19), #5 (11.11.19), #6 (12.09.19), #7 (01.06.20), #8 (02.18.20), #9 (03.17.20)  - S/P IVA OS #1 (12.09.19),  #2 (01.06.20), #3 (02.18.20), #4 (03.17.20)  - S/P focal laser OS (09.25.19)  - today, delayed to 9 wks instead of 4 due to other health  issues  - OCT today shows interval increase in non central IRF / DME OD, and IRF/SRF OS -- ?CSR component  - BCVA at 20/30-2 OD, 20/40-2 OS  - recommend IVA OU today, #10 OD, #5 OS  - pt wishes to proceed  - RBA of procedure discussed, questions answered  - informed consent obtained and signed  - see procedure note  - f/u in 4 weeks -- DFE/OCT  3,4. Hypertensive retinopathy OU  - discussed importance of tight BP control  - monitor  5. Combined form age related cataract OU-   - The symptoms of cataract, surgical options, and treatments and risks were discussed with patient.  - discussed diagnosis and progression  - not yet visually significant  - monitor for now  Ophthalmic Meds Ordered this visit:  Meds ordered this encounter  Medications  . Bevacizumab (AVASTIN) SOLN 1.25 mg  . Bevacizumab (AVASTIN) SOLN 1.25 mg      Return in about 5 weeks (around 06/29/2018) for f/u NPDR OU, DFE, OCT.  There are no Patient Instructions on file for this visit.  This document serves as a record of services personally performed by Karie Chimera, MD, PhD. It was created on their behalf by Laurian Brim, OA, an ophthalmic assistant. The creation of this record is the provider's dictation and/or activities during the visit.    Electronically signed by: Laurian Brim, OA  05.19.2020 5:04 PM    Karie Chimera, M.D., Ph.D. Diseases & Surgery of the Retina and Vitreous [facility]  I have reviewed the above documentation for accuracy and completeness, and I agree with the above. Karie Chimera, M.D., Ph.D. 05/25/18 5:07 PM    Abbreviations: M myopia (nearsighted); A astigmatism; H hyperopia (farsighted); P presbyopia; Mrx  spectacle prescription;  CTL contact lenses; OD right eye; OS left eye; OU both eyes  XT exotropia; ET esotropia; PEK punctate epithelial keratitis; PEE punctate epithelial erosions; DES dry eye syndrome; MGD meibomian gland dysfunction; ATs artificial  tears; PFAT's preservative free artificial tears; NSC nuclear sclerotic cataract; PSC posterior subcapsular cataract; ERM epi-retinal membrane; PVD posterior vitreous detachment; RD retinal detachment; DM diabetes mellitus; DR diabetic retinopathy; NPDR non-proliferative diabetic retinopathy; PDR proliferative diabetic retinopathy; CSME clinically significant macular edema; DME diabetic macular edema; dbh dot blot hemorrhages; CWS cotton wool spot; POAG primary open angle glaucoma; C/D cup-to-disc ratio; HVF humphrey visual field; GVF goldmann visual field; OCT optical coherence tomography; IOP intraocular pressure; BRVO Branch retinal vein occlusion; CRVO central retinal vein occlusion; CRAO central retinal artery occlusion; BRAO branch retinal artery occlusion; RT retinal tear; SB scleral buckle; PPV pars plana vitrectomy; VH Vitreous hemorrhage; PRP panretinal laser photocoagulation; IVK intravitreal kenalog; VMT vitreomacular traction; MH Macular hole;  NVD neovascularization of the disc; NVE neovascularization elsewhere; AREDS age related eye disease study; ARMD age related macular degeneration; POAG primary open angle glaucoma; EBMD epithelial/anterior basement membrane dystrophy; ACIOL anterior chamber intraocular lens; IOL intraocular lens; PCIOL posterior chamber intraocular lens; Phaco/IOL phacoemulsification with intraocular lens placement; PRK photorefractive keratectomy; LASIK laser assisted in situ keratomileusis; HTN hypertension; DM diabetes mellitus; COPD chronic obstructive pulmonary disease

## 2018-06-29 NOTE — Progress Notes (Signed)
[facility] Note  06/30/2018     CHIEF COMPLAINT Patient presents for Retina Follow Up   HISTORY OF PRESENT ILLNESS: Sheila Gibson is a 48 y.o. female who presents to the clinic today for:   HPI    Retina Follow Up    Patient presents with  Diabetic Retinopathy.  In both eyes.  This started 4 months ago.  Since onset it is stable.  I, the attending physician,  performed the HPI with the patient and updated documentation appropriately.          Comments    F/U NPDR OU. Patient states her vision is "good", denies new visual onsets/issues. Pt reports her glucometer meter is broken she has not checked her BS in a while now, denies hypo/hyperglucemic episodes.        Last edited by Bernarda Caffey, MD on 06/30/2018  1:27 PM. (History)    pt states she feels like her vision is doing okay, but she had a hard time on the eye chart today   Referring physician: Lorayne Bender, [place],  [place] [address]  HISTORICAL INFORMATION:   Selected notes from the MEDICAL RECORD NUMBER Referred by Dr. Gevena Cotton for concern of PDR and CSME OU LEE: 06.24.19 Reatha Armour) [BCVA: OD: 20/60-2 OS: 20/25] Ocular Hx-DME OU, PDR OU, cataract OU PMH-DM (taking Lantus and Novolog), Thyroid disease, HTN    CURRENT MEDICATIONS: No current outpatient medications on file. (Ophthalmic Drugs)   No current facility-administered medications for this visit.  (Ophthalmic Drugs)   Current Outpatient Medications (Other)  Medication Sig  . acetaminophen (TYLENOL) 500 MG tablet Take 500 mg by mouth daily as needed for moderate pain.  Marland Kitchen albuterol (PROVENTIL HFA;VENTOLIN HFA) 108 (90 BASE) MCG/ACT inhaler Inhale 1-2 puffs into the lungs every 6 (six) hours as needed for wheezing or shortness of breath.  . clotrimazole (LOTRIMIN) 1 % cream Apply to affected area 2 times daily  . gabapentin (NEURONTIN) 800 MG tablet Take 800 mg by mouth 3 (three) times daily.    . hydrOXYzine (ATARAX/VISTARIL) 25 MG tablet Take 25 mg by mouth 3 (three) times daily as needed. for anxiety  . ibuprofen (ADVIL,MOTRIN) 600 MG tablet Take 1 tablet (600 mg total) by mouth every 6 (six) hours as needed.  . insulin aspart protamine- aspart (NOVOLOG MIX 70/30) (70-30) 100 UNIT/ML injection Inject 6 Units into the skin 2 (two) times daily with a meal.  . insulin glargine (LANTUS) 100 unit/mL SOPN Inject 60 Units into the skin 2 (two) times daily.   Marland Kitchen levothyroxine (SYNTHROID, LEVOTHROID) 50 MCG tablet Take 50 mcg by mouth daily.   Marland Kitchen losartan (COZAAR) 100 MG tablet Take 1 tablet (100 mg total) by mouth daily.  . ondansetron (ZOFRAN ODT) 4 MG disintegrating tablet 4mg  ODT q4 hours prn nausea/vomit  . ondansetron (ZOFRAN) 4 MG tablet Take 4 mg by mouth daily as needed for nausea or vomiting.  . promethazine-dextromethorphan (PROMETHAZINE-DM) 6.25-15 MG/5ML syrup Take 5 mLs by mouth 3 (three) times daily as needed for cough.  . tenofovir (VIREAD) 300 MG tablet Take 300 mg by mouth daily.  Marland Kitchen tiZANidine (ZANAFLEX) 4 MG tablet Take 4 mg by mouth 3 (three) times daily as needed for muscle spasms.   Marland Kitchen triamcinolone cream (KENALOG) 0.1 % Apply 1 application topically 3 (three) times daily.  Marland Kitchen venlafaxine XR (EFFEXOR-XR) 37.5 MG 24 hr capsule Take 37.5 mg by mouth daily.  Marland Kitchen zolpidem (AMBIEN)  10 MG tablet Take 10 mg by mouth at bedtime as needed for sleep.   Marland Kitchen. ZOVIRAX 5 % Apply 1 application topically 3 (three) times daily.    Current Facility-Administered Medications (Other)  Medication Route  . Bevacizumab (AVASTIN) SOLN 1.25 mg Intravitreal  . Bevacizumab (AVASTIN) SOLN 1.25 mg Intravitreal  . Bevacizumab (AVASTIN) SOLN 1.25 mg Intravitreal  . Bevacizumab (AVASTIN) SOLN 1.25 mg Intravitreal  . Bevacizumab (AVASTIN) SOLN 1.25 mg Intravitreal  . Bevacizumab (AVASTIN) SOLN 1.25 mg Intravitreal  . Bevacizumab (AVASTIN) SOLN 1.25 mg Intravitreal  . Bevacizumab (AVASTIN) SOLN 1.25 mg  Intravitreal  . Bevacizumab (AVASTIN) SOLN 1.25 mg Intravitreal  . Bevacizumab (AVASTIN) SOLN 1.25 mg Intravitreal  . Bevacizumab (AVASTIN) SOLN 1.25 mg Intravitreal      REVIEW OF SYSTEMS: ROS    Positive for: Endocrine, Eyes   Negative for: Constitutional, Gastrointestinal, Neurological, Skin, Genitourinary, Musculoskeletal, HENT, Cardiovascular, Respiratory, Psychiatric, Allergic/Imm, Heme/Lymph   Last edited by Eldridge ScotKendrick, Glenda, LPN on 4/09/81196/24/2020  1:00 PM. (History)       ALLERGIES No Known Allergies  PAST MEDICAL HISTORY Past Medical History:  Diagnosis Date  . COPD (chronic obstructive pulmonary disease) (HCC)   . Depression   . Diabetes mellitus without complication (HCC)   . Heart murmur   . Hepatic cirrhosis (HCC)   . Hypertension   . Obesity   . Thyroid disease    Past Surgical History:  Procedure Laterality Date  . CESAREAN SECTION    . ESOPHAGOGASTRODUODENOSCOPY (EGD) WITH PROPOFOL N/A 09/01/2017   Procedure: ESOPHAGOGASTRODUODENOSCOPY (EGD) WITH PROPOFOL;  Surgeon: Kathi DerBrahmbhatt, Parag, MD;  Location: [facility];  Service: Gastroenterology;  Laterality: N/A;  . THYROIDECTOMY      FAMILY HISTORY Family History  Problem Relation Age of Onset  . Cancer Mother   . Hypertension Other     SOCIAL HISTORY Social History   Tobacco Use  . Smoking status: Never Smoker  . Smokeless tobacco: Never Used  Substance Use Topics  . Alcohol use: Not Currently  . Drug use: No         OPHTHALMIC EXAM:  Base Eye Exam    Visual Acuity (Snellen - Linear)      Right Left   Dist [place] 20/60 20/100 -1   Dist ph  20/40 -1 20/50 -1       Tonometry (Tonopen, 1:13 PM)      Right Left   Pressure 20 21       Pupils      Dark Light Shape React APD   Right 3 2 Round Brisk None   Left 3 2 Round Brisk None       Visual Fields      Left Right    Full Full       Extraocular Movement      Right Left    Full, Ortho Full, Ortho       Neuro/Psych    Oriented  x3: Yes   Mood/Affect: Normal       Dilation    Both eyes: 1.0% Mydriacyl, 2.5% Phenylephrine @ 1:05 PM        Slit Lamp and Fundus Exam    Slit Lamp Exam      Right Left   Lids/Lashes Dermatochalasis - upper lid Dermatochalasis - upper lid   Conjunctiva/Sclera Melanosis Melanosis   Cornea Arcus, 2+ diffuse Punctate epithelial erosions Arcus, 2+ diffuse Punctate epithelial erosions   Anterior Chamber Deep and quiet Deep and quiet   Iris Round and dilated  Round and dilated   Lens 1+ Nuclear sclerosis, 2+ Cortical cataract 1+ Nuclear sclerosis, 2+ Cortical cataract   Vitreous Trace Vitreous syneresis Trace Vitreous syneresis       Fundus Exam      Right Left   Disc Pink and Sharp Pink and Sharp   C/D Ratio 0.4 0.5   Macula Stably improved foveal reflex, parafoveal exudates SN fovea -- improved, scattered IRH/CWS -- improved, scattered non-central cystic changes blunted foveal reflex,interval improvement in SRF/IRF centrally, cluster of Microaneurysms and exudates superior to fovea with +edema -- improved, scattered Intraretinal hemorrhage, scattered DBH, mild focal laser scars superior to fovea   Vessels Dilated and Tortuous veins, +beading -- slight improvement, Copper wiring Dilated and Tortuous veins, Copper wiring   Periphery Attached, +IRH, +CWS -- scattered surrounding disc, scattered posterior DBH Attached, 360 IRH and CWS superiorly and around nerve, Cotton wool spots and DBH nasal to fovea -- improving          IMAGING AND PROCEDURES  Imaging and Procedures for @TODAY @  OCT, Retina - OU - Both Eyes       Right Eye Quality was good. Central Foveal Thickness: 219. Progression has been stable. Findings include no SRF, outer retinal atrophy, normal foveal contour, intraretinal fluid, intraretinal hyper-reflective material (Mild interval improvement in non-central IRF -- nasal macula).   Left Eye Quality was good. Central Foveal Thickness: 311. Progression has improved.  Findings include intraretinal fluid, abnormal foveal contour, subretinal fluid (Interval improvement in IRF/SRF, +IRHM).   Notes *Images captured and stored on drive  Diagnosis / Impression:  OD: Mild interval improvement in non-central IRF -- nasal macula OS: +DME -- Interval improvement in IRF/SRF, +IRHM  Clinical management:  See below  Abbreviations: NFP - Normal foveal profile. CME - cystoid macular edema. PED - pigment epithelial detachment. IRF - intraretinal fluid. SRF - subretinal fluid. EZ - ellipsoid zone. ERM - epiretinal membrane. ORA - outer retinal atrophy. ORT - outer retinal tubulation. SRHM - subretinal hyper-reflective material         Intravitreal Injection, Pharmacologic Agent - OS - Left Eye       Time Out 06/30/2018. 1:16 PM. Confirmed correct patient, procedure, site, and patient consented.   Anesthesia Topical anesthesia was used. Anesthetic medications included Lidocaine 2%, Proparacaine 0.5%.   Procedure Preparation included 5% betadine to ocular surface, eyelid speculum. A supplied needle was used.   Injection:  1.25 mg Bevacizumab (AVASTIN) SOLN   NDC: 16109-604-5450242-060-01, Lot: [id number] , Expiration date: 08/18/2018   Route: Intravitreal, Site: Left Eye, Waste: 0 mL  Post-op Post injection exam found visual acuity of at least counting fingers. The patient tolerated the procedure well. There were no complications. The patient received written and verbal post procedure care education.                 ASSESSMENT/PLAN:    ICD-10-CM   1. Severe nonproliferative diabetic retinopathy of both eyes with macular edema associated with type 2 diabetes mellitus (HCC)  W23.7628E11.3413 Intravitreal Injection, Pharmacologic Agent - OS - Left Eye    Bevacizumab (AVASTIN) SOLN 1.25 mg    CANCELED: Intravitreal Injection, Pharmacologic Agent - OD - Right Eye  2. Retinal edema  H35.81 OCT, Retina - OU - Both Eyes  3. Essential hypertension  I10   4. Hypertensive  retinopathy of both eyes  H35.033   5. Combined forms of age-related cataract of both eyes  H25.813    1,2. Severe Non-proliferative diabetic retinopathy, OU  - exam  with extensive 360 IRH, CWS and venous beading OU  - initial FA shows late leaking MAs OU, No NV  - OCT shows diabetic macular edema, OU   - S/P IVA OD #1 (07.18.19), #2 (08.14.19), #3 (09.11.19), #4 (10.14.19), #5 (11.11.19), #6 (12.09.19), #7 (01.06.20), #8 (02.18.20), #9 (03.17.20), #10 (05.19.20)  - S/P IVA OS #1 (12.09.19),  #2 (01.06.20), #3 (02.18.20), #4 (03.17.20), #5 (05.19.20)  - S/P focal laser OS (09.25.19)  - OCT today shows persistent non central IRF / DME OD, and interval improvement in IRF/SRF OS -- ?CSR component  - BCVA 20/40 OD, 20/50OS  - recommend IVA #6 OS today, 06.24.20  - pt wishes to proceed  - RBA of procedure discussed, questions answered  - informed consent obtained and signed  - see procedure note  - f/u in 4 weeks -- DFE/OCT  3,4. Hypertensive retinopathy OU  - discussed importance of tight BP control  - monitor  5. Combined form age related cataract OU-   - The symptoms of cataract, surgical options, and treatments and risks were discussed with patient.  - discussed diagnosis and progression  - not yet visually significant  - monitor for now  Ophthalmic Meds Ordered this visit:  Meds ordered this encounter  Medications  . Bevacizumab (AVASTIN) SOLN 1.25 mg      Return for f/u 4-5 weeks, NPDR OU, DFE, OCT.  There are no Patient Instructions on file for this visit.  This document serves as a record of services personally performed by Karie ChimeraBrian G. Patrisia Faeth, MD, PhD. It was created on their behalf by Laurian BrimAmanda Brown, OA, an ophthalmic assistant. The creation of this record is the provider's dictation and/or activities during the visit.    Electronically signed by: Laurian BrimAmanda Brown, OA  06.23.2020 5:57 PM    Karie ChimeraBrian G. Wade Sigala, M.D., Ph.D. Diseases & Surgery of the Retina and Vitreous [facility]  I have reviewed the above documentation for accuracy and completeness, and I agree with the above. Karie ChimeraBrian G. Rajanee Schuelke, M.D., Ph.D. 06/30/18 5:57 PM   Abbreviations: M myopia (nearsighted); A astigmatism; H hyperopia (farsighted); P presbyopia; Mrx spectacle prescription;  CTL contact lenses; OD right eye; OS left eye; OU both eyes  XT exotropia; ET esotropia; PEK punctate epithelial keratitis; PEE punctate epithelial erosions; DES dry eye syndrome; MGD meibomian gland dysfunction; ATs artificial tears; PFAT's preservative free artificial tears; NSC nuclear sclerotic cataract; PSC posterior subcapsular cataract; ERM epi-retinal membrane; PVD posterior vitreous detachment; RD retinal detachment; DM diabetes mellitus; DR diabetic retinopathy; NPDR non-proliferative diabetic retinopathy; PDR proliferative diabetic retinopathy; CSME clinically significant macular edema; DME diabetic macular edema; dbh dot blot hemorrhages; CWS cotton wool spot; POAG primary open angle glaucoma; C/D cup-to-disc ratio; HVF humphrey visual field; GVF goldmann visual field; OCT optical coherence tomography; IOP intraocular pressure; BRVO Branch retinal vein occlusion; CRVO central retinal vein occlusion; CRAO central retinal artery occlusion; BRAO branch retinal artery occlusion; RT retinal tear; SB scleral buckle; PPV pars plana vitrectomy; VH Vitreous hemorrhage; PRP panretinal laser photocoagulation; IVK intravitreal kenalog; VMT vitreomacular traction; MH Macular hole;  NVD neovascularization of the disc; NVE neovascularization elsewhere; AREDS age related eye disease study; ARMD age related macular degeneration; POAG primary open angle glaucoma; EBMD epithelial/anterior basement membrane dystrophy; ACIOL anterior chamber intraocular lens; IOL intraocular lens; PCIOL posterior chamber intraocular lens; Phaco/IOL phacoemulsification with intraocular lens placement; PRK photorefractive keratectomy; LASIK  laser assisted in situ keratomileusis; HTN hypertension; DM diabetes mellitus; COPD chronic obstructive pulmonary disease

## 2018-06-30 ENCOUNTER — Encounter (INDEPENDENT_AMBULATORY_CARE_PROVIDER_SITE_OTHER): Payer: Self-pay | Admitting: Ophthalmology

## 2018-06-30 ENCOUNTER — Ambulatory Visit (INDEPENDENT_AMBULATORY_CARE_PROVIDER_SITE_OTHER): Payer: Medicaid Other | Admitting: Ophthalmology

## 2018-06-30 ENCOUNTER — Other Ambulatory Visit: Payer: Self-pay

## 2018-06-30 DIAGNOSIS — I10 Essential (primary) hypertension: Secondary | ICD-10-CM | POA: Diagnosis not present

## 2018-06-30 DIAGNOSIS — H3581 Retinal edema: Secondary | ICD-10-CM

## 2018-06-30 DIAGNOSIS — H35033 Hypertensive retinopathy, bilateral: Secondary | ICD-10-CM | POA: Diagnosis not present

## 2018-06-30 DIAGNOSIS — E113413 Type 2 diabetes mellitus with severe nonproliferative diabetic retinopathy with macular edema, bilateral: Secondary | ICD-10-CM

## 2018-06-30 DIAGNOSIS — H25813 Combined forms of age-related cataract, bilateral: Secondary | ICD-10-CM

## 2018-06-30 MED ORDER — BEVACIZUMAB CHEMO INJECTION 1.25MG/0.05ML SYRINGE FOR KALEIDOSCOPE
1.2500 mg | INTRAVITREAL | Status: AC | PRN
  Administered 2018-06-30: 1.25 mg via INTRAVITREAL

## 2018-07-28 ENCOUNTER — Encounter (INDEPENDENT_AMBULATORY_CARE_PROVIDER_SITE_OTHER): Payer: Medicaid Other | Admitting: Ophthalmology

## 2018-08-20 ENCOUNTER — Encounter (HOSPITAL_COMMUNITY): Payer: Self-pay

## 2018-08-20 ENCOUNTER — Ambulatory Visit (HOSPITAL_COMMUNITY)
Admission: EM | Admit: 2018-08-20 | Discharge: 2018-08-20 | Disposition: A | Discharge status: Home / Self Care | Payer: Medicaid Other | Attending: Family Medicine | Admitting: Family Medicine

## 2018-08-20 DIAGNOSIS — R112 Nausea with vomiting, unspecified: Secondary | ICD-10-CM | POA: Diagnosis not present

## 2018-08-20 DIAGNOSIS — K219 Gastro-esophageal reflux disease without esophagitis: Secondary | ICD-10-CM | POA: Diagnosis not present

## 2018-08-20 DIAGNOSIS — R229 Localized swelling, mass and lump, unspecified: Secondary | ICD-10-CM

## 2018-08-20 LAB — POCT PREGNANCY, URINE: Preg Test, Ur: NEGATIVE

## 2018-08-20 MED ORDER — OMEPRAZOLE 40 MG PO CPDR: 40.0000 mg | DELAYED_RELEASE_CAPSULE | Freq: Every day | ORAL | 0 refills | Status: DC

## 2018-08-20 MED ORDER — FAMOTIDINE 20 MG PO TABS: 20.0000 mg | ORAL_TABLET | Freq: Every day | ORAL | 0 refills | Status: DC

## 2018-08-20 NOTE — ED Triage Notes (Signed)
Pt presents with complaints of emesis x 2 weeks and a lump the size of a golf call on her right hip. Reports she has missed her last two periods. Was recently tested for COVID and it was negative.

## 2018-08-20 NOTE — Discharge Instructions (Signed)
Please start taking daily omeprazole in the morning. Pepcid before bed. See provided information about changes which may help manage your symptoms.  Warm compresses to the area of your leg. If this worsens, becomes more red, painful, swollen or otherwise worsens please return to be seen.  Please continue to follow with your PCP if symptoms persist.  If develop fevers, dehydration, pain, or otherwise worsening please go to the ER.

## 2018-08-20 NOTE — ED Provider Notes (Signed)
[facility]    CSN: [id number] Arrival date & time: 08/20/18  1118      History   Chief Complaint Chief Complaint  Patient presents with  . Emesis  . Mass    [address] Gibson is a 48 y.o. female.   [facility] presents with complaints of heartburn, nausea and vomiting which she wakes with at approximately 0200a and lasts until approximately 0800. She will have multiple episodes of vomiting in that time frame. No abdominal pain with this. No blood, non bilious emesis. Denies any current nausea. No abdominal pain. During the day she is able to eat and drink well without difficulty. Has had some increased in BM's, they are softer but not loose. No dizziness. No lightheadedness. Normal urination. She has been taking baking soda as a heartburn relief. No other medications for this. Denies any previous abdominal history. History  Of htn, hepatic cirrhosis, dm, copd, thyroid disease. Her period is late. States there is also a "knot" to her right lateral thigh which she noted this morning today. It is tender if touched, but otherwise no pain. No redness or swelling. She is concerned about possible spider bite although no specific known bite. Denies any previous similar. Hasn't tried anything for it.      ROS per HPI, negative if not otherwise mentioned.      Past Medical History:  Diagnosis Date  . COPD (chronic obstructive pulmonary disease) (HCC)   . Depression   . Diabetes mellitus without complication (HCC)   . Heart murmur   . Hepatic cirrhosis (HCC)   . Hypertension   . Obesity   . Thyroid disease     Patient Active Problem List   Diagnosis Date Noted  . Warts, genital 08/20/2017  . Hypertensive urgency 08/17/2017    Past Surgical History:  Procedure Laterality Date  . CESAREAN SECTION    . ESOPHAGOGASTRODUODENOSCOPY (EGD) WITH PROPOFOL N/A 09/01/2017   Procedure: ESOPHAGOGASTRODUODENOSCOPY (EGD) WITH PROPOFOL;  Surgeon: Kathi DerBrahmbhatt,  Parag, MD;  Location: [facility];  Service: Gastroenterology;  Laterality: N/A;  . THYROIDECTOMY      OB History    Gravida  6   Para  5   Term  5   Preterm      AB  1   Living  4     SAB  1   TAB      Ectopic      Multiple      Live Births  4            Home Medications    Prior to Admission medications   Medication Sig Start Date End Date Taking? Authorizing Provider  acetaminophen (TYLENOL) 500 MG tablet Take 500 mg by mouth daily as needed for moderate pain.    [provider]  albuterol (PROVENTIL HFA;VENTOLIN HFA) 108 (90 BASE) MCG/ACT inhaler Inhale 1-2 puffs into the lungs every 6 (six) hours as needed for wheezing or shortness of breath.    [provider]  clotrimazole (LOTRIMIN) 1 % cream Apply to affected area 2 times daily 03/31/18   Wieters, Hallie C, PA-C  famotidine (PEPCID) 20 MG tablet Take 1 tablet (20 mg total) by mouth at bedtime. 08/20/18   Georgetta HaberBurky, Natalie B, NP  gabapentin (NEURONTIN) 800 MG tablet Take 800 mg by mouth 3 (three) times daily.     [provider]  hydrOXYzine (ATARAX/VISTARIL) 25 MG tablet Take 25 mg by mouth 3 (three) times daily as needed. for  anxiety 08/05/17   [provider]  ibuprofen (ADVIL,MOTRIN) 600 MG tablet Take 1 tablet (600 mg total) by mouth every 6 (six) hours as needed. 01/12/18   Wieters, Hallie C, PA-C  insulin aspart protamine- aspart (NOVOLOG MIX 70/30) (70-30) 100 UNIT/ML injection Inject 6 Units into the skin 2 (two) times daily with a meal.    [provider]  insulin glargine (LANTUS) 100 unit/mL SOPN Inject 60 Units into the skin 2 (two) times daily.     [provider]  levothyroxine (SYNTHROID, LEVOTHROID) 50 MCG tablet Take 50 mcg by mouth daily.  07/20/17   [provider]  losartan (COZAAR) 100 MG tablet Take 1 tablet (100 mg total) by mouth daily. 01/18/18   Wallis BambergMani, Mario, PA-C  omeprazole (PRILOSEC) 40 MG capsule Take 1 capsule (40 mg total) by  mouth daily. 08/20/18   Georgetta HaberBurky, Natalie B, NP  ondansetron (ZOFRAN ODT) 4 MG disintegrating tablet 4mg  ODT q4 hours prn nausea/vomit 08/27/17   Bethann BerkshireZammit, Joseph, MD  ondansetron (ZOFRAN) 4 MG tablet Take 4 mg by mouth daily as needed for nausea or vomiting.    [provider]  promethazine-dextromethorphan (PROMETHAZINE-DM) 6.25-15 MG/5ML syrup Take 5 mLs by mouth 3 (three) times daily as needed for cough. 01/18/18   Wallis BambergMani, Mario, PA-C  tenofovir (VIREAD) 300 MG tablet Take 300 mg by mouth daily. 08/03/17   [provider]  tiZANidine (ZANAFLEX) 4 MG tablet Take 4 mg by mouth 3 (three) times daily as needed for muscle spasms.     [provider]  triamcinolone cream (KENALOG) 0.1 % Apply 1 application topically 3 (three) times daily. 08/05/17   [provider]  venlafaxine XR (EFFEXOR-XR) 37.5 MG 24 hr capsule Take 37.5 mg by mouth daily. 07/06/17   [provider]  zolpidem (AMBIEN) 10 MG tablet Take 10 mg by mouth at bedtime as needed for sleep.  01/23/17   [provider]  ZOVIRAX 5 % Apply 1 application topically 3 (three) times daily.  04/14/17   [provider]    Family History Family History  Problem Relation Age of Onset  . Cancer Mother   . Hypertension Other     Social History Social History   Tobacco Use  . Smoking status: Never Smoker  . Smokeless tobacco: Never Used  Substance Use Topics  . Alcohol use: Not Currently  . Drug use: No     Allergies   Patient has no known allergies.   Review of Systems Review of Systems   Physical Exam Triage Vital Signs ED Triage Vitals  Enc Vitals Group     BP 08/20/18 1140 (!) 178/90     Pulse Rate 08/20/18 1140 88     Resp 08/20/18 1140 18     Temp 08/20/18 1140 98.4 F (36.9 C)     Temp src --      SpO2 08/20/18 1140 98 %     Weight --      Height --      Head Circumference --      Peak Flow --      Pain Score 08/20/18 1137 0     Pain Loc --      Pain Edu? --       Excl. in GC? --    No data found.  Updated Vital Signs BP (!) 178/90   Pulse 88   Temp 98.4 F (36.9 C)   Resp 18   LMP 06/20/2018   SpO2 98%  Physical Exam Constitutional:      General: She is not in acute distress.    Appearance: She is well-developed. She is obese.  Cardiovascular:     Rate and Rhythm: Normal rate.  Pulmonary:     Effort: Pulmonary effort is normal.  Abdominal:     General: There is no distension.     Palpations: There is no mass.     Tenderness: There is no abdominal tenderness. There is no right CVA tenderness, left CVA tenderness, guarding or rebound.  Musculoskeletal:       Legs:     Comments: Difficult to fully appreciate the "knot" patient is feeling to proximal right lateral thigh due to body habitus; non tender; no redness or warmth; no palpable or visible abscess or cellulitis; lipoma? Or beginning of abscess? Maybe some denser tissue to the area   Skin:    General: Skin is warm and dry.  Neurological:     Mental Status: She is alert and oriented to person, place, and time.      UC Treatments / Results  Labs (all labs ordered are listed, but only abnormal results are displayed) Labs Reviewed  POC URINE PREG, ED  POCT PREGNANCY, URINE    EKG   Radiology No results found.  Procedures Procedures (including critical care time)  Medications Ordered in UC Medications - No data to display  Initial Impression / Assessment and Plan / UC Course  I have reviewed the triage vital signs and the nursing notes.  Pertinent labs & imaging results that were available during my care of the patient were reviewed by me and considered in my medical decision making (see chart for details).     Negative urine pregnancy. Sounds likely consistent with reflux which is causing her morning nausea and vomiting. Lifestyle modifications and diet recommendations discussed. Close follow up recommended. Return precautions provided. Lipoma vs early abscess  to thigh? Difficult in assessing this complaint well due to body habitus but no red flag findings today. To return if progressing. Patient verbalized understanding and agreeable to plan.  Ambulatory out of clinic without difficulty.    Final Clinical Impressions(s) / UC Diagnoses   Final diagnoses:  Gastroesophageal reflux disease, esophagitis presence not specified  Non-intractable vomiting with nausea, unspecified vomiting type  Localized soft tissue swelling     Discharge Instructions     Please start taking daily omeprazole in the morning. Pepcid before bed. See provided information about changes which may help manage your symptoms.  Warm compresses to the area of your leg. If this worsens, becomes more red, painful, swollen or otherwise worsens please return to be seen.  Please continue to follow with your PCP if symptoms persist.  If develop fevers, dehydration, pain, or otherwise worsening please go to the ER.    ED Prescriptions    Medication Sig Dispense Auth. Provider   omeprazole (PRILOSEC) 40 MG capsule Take 1 capsule (40 mg total) by mouth daily. 30 capsule Augusto Gamble B, NP   famotidine (PEPCID) 20 MG tablet Take 1 tablet (20 mg total) by mouth at bedtime. 30 tablet Zigmund Gottron, NP     Controlled Substance Prescriptions [place] Controlled Substance Registry consulted? Not Applicable   Zigmund Gottron, NP 08/20/18 1227

## 2018-09-06 ENCOUNTER — Encounter (HOSPITAL_COMMUNITY): Payer: Self-pay

## 2018-09-06 ENCOUNTER — Other Ambulatory Visit: Payer: Self-pay

## 2018-09-06 ENCOUNTER — Ambulatory Visit (HOSPITAL_COMMUNITY)
Admission: EM | Admit: 2018-09-06 | Discharge: 2018-09-06 | Disposition: A | Discharge status: Home / Self Care | Payer: Medicaid Other | Attending: Family Medicine | Admitting: Family Medicine

## 2018-09-06 DIAGNOSIS — I10 Essential (primary) hypertension: Secondary | ICD-10-CM

## 2018-09-06 DIAGNOSIS — H00014 Hordeolum externum left upper eyelid: Secondary | ICD-10-CM | POA: Diagnosis not present

## 2018-09-06 MED ORDER — TOBRAMYCIN 0.3 % OP SOLN: 1.0000 [drp] | Freq: Four times a day (QID) | OPHTHALMIC | 0 refills | Status: DC

## 2018-09-06 NOTE — ED Triage Notes (Signed)
Pt states she has eye swelling and draining x 2 days.  Pt states it's sore.

## 2018-09-06 NOTE — ED Provider Notes (Signed)
[facility]   [id number] 09/06/18 Arrival Time: 1030  ASSESSMENT & PLAN:  1. Hordeolum externum of left upper eyelid   2. Uncontrolled hypertension     Meds ordered this encounter  Medications  . tobramycin (TOBREX) 0.3 % ophthalmic solution    Sig: Place 1 drop into the right eye every 6 (six) hours.    Dispense:  5 mL    Refill:  0   Discussed the diagnosis and proper care of a stye/conjunctivitis. Ophthalmic drops per orders. Warm compress to eye(s). Local eye care discussed.  Also discussed elevated BP. Not currently taking BP medication.  Recommend: Follow-up Information    Schedule an appointment as soon as possible for a visit  with Alveta HeimlichJohnson, Angela E, FNP.   Specialty: Family Medicine Why: To discuss your blood pressure. Contact information: [address] [place] [id number] [phone]          May f/u here as needed.  Reviewed expectations re: course of current medical issues. Questions answered. Outlined signs and symptoms indicating need for more acute intervention. Patient verbalized understanding. After Visit Summary given.   SUBJECTIVE:  Sheila Gibson is a 48 y.o. female who presents with complaint of persistent left eye irritation and swelling. Onset abrupt, first noticed 1-2 days ago. Questions "a sore" on L medial upper eyelid. Injury: no. Visual changes: no. No photophobia. Contact lens use: no. Recent illness: no. Self treatment: none reported.  Increased blood pressure noted today. Reports that she is being treated for HTN.  She reports no chest pain on exertion, no dyspnea on exertion, no swelling of ankles, no orthostatic dizziness or lightheadedness, no orthopnea or paroxysmal nocturnal dyspnea, no palpitations and no intermittent claudication symptoms.  ROS: As per HPI. All other systems negative.   OBJECTIVE:  Vitals:   09/06/18 1118 09/06/18 1122  BP: (!) 191/98   Pulse: 90   Resp: 18   Temp: 98.6 F  (37 C)   TempSrc: Oral   SpO2: 100%   Weight:  133.8 kg    General appearance: alert; no distress HEENT: [place]; AT; PERRLA; EOMI OS: without reported pain; with mild conjunctival injection; without active drainage; without corneal opacities; without limbal flush; without periorbital swelling or erythema OD: normal exam Neck: supple without LAD Lungs: clear to auscultation bilaterally; unlabored respirations Heart: regular rate and rhythm Skin: warm and dry Psychological: alert and cooperative; normal mood and affect  No Known Allergies  Past Medical History:  Diagnosis Date  . COPD (chronic obstructive pulmonary disease) (HCC)   . Depression   . Diabetes mellitus without complication (HCC)   . Heart murmur   . Hepatic cirrhosis (HCC)   . Hypertension   . Obesity   . Thyroid disease    Social History   Socioeconomic History  . Marital status: Single    Spouse name: Not on file  . Number of children: Not on file  . Years of education: Not on file  . Highest education level: Not on file  Occupational History  . Not on file  Social Needs  . Financial resource strain: Not on file  . Food insecurity    Worry: Not on file    Inability: Not on file  . Transportation needs    Medical: Not on file    Non-medical: Not on file  Tobacco Use  . Smoking status: Never Smoker  . Smokeless tobacco: Never Used  Substance and Sexual Activity  . Alcohol use: Not Currently  . Drug  use: No  . Sexual activity: Not Currently    Birth control/protection: None  Lifestyle  . Physical activity    Days per week: Not on file    Minutes per session: Not on file  . Stress: Not on file  Relationships  . Social Herbalist on phone: Not on file    Gets together: Not on file    Attends religious service: Not on file    Active member of club or organization: Not on file    Attends meetings of clubs or organizations: Not on file    Relationship status: Not on file  . Intimate  partner violence    Fear of current or ex partner: Not on file    Emotionally abused: Not on file    Physically abused: Not on file    Forced sexual activity: Not on file  Other Topics Concern  . Not on file  Social History Narrative  . Not on file   Family History  Problem Relation Age of Onset  . Cancer Mother   . Hypertension Other    Past Surgical History:  Procedure Laterality Date  . CESAREAN SECTION    . ESOPHAGOGASTRODUODENOSCOPY (EGD) WITH PROPOFOL N/A 09/01/2017   Procedure: ESOPHAGOGASTRODUODENOSCOPY (EGD) WITH PROPOFOL;  Surgeon: Otis Brace, MD;  Location: [facility];  Service: Gastroenterology;  Laterality: N/A;  . Lanier Clam, MD 09/06/18 1248

## 2018-12-07 ENCOUNTER — Encounter (HOSPITAL_BASED_OUTPATIENT_CLINIC_OR_DEPARTMENT_OTHER): Payer: Self-pay | Admitting: Emergency Medicine

## 2018-12-07 ENCOUNTER — Other Ambulatory Visit: Payer: Self-pay

## 2018-12-07 ENCOUNTER — Emergency Department (HOSPITAL_BASED_OUTPATIENT_CLINIC_OR_DEPARTMENT_OTHER)
Admission: EM | Admit: 2018-12-07 | Discharge: 2018-12-08 | Disposition: A | Discharge status: Home / Self Care | Payer: Medicaid Other | Attending: Emergency Medicine | Admitting: Emergency Medicine

## 2018-12-07 DIAGNOSIS — M791 Myalgia, unspecified site: Secondary | ICD-10-CM | POA: Diagnosis not present

## 2018-12-07 DIAGNOSIS — E1140 Type 2 diabetes mellitus with diabetic neuropathy, unspecified: Secondary | ICD-10-CM | POA: Insufficient documentation

## 2018-12-07 DIAGNOSIS — I10 Essential (primary) hypertension: Secondary | ICD-10-CM | POA: Insufficient documentation

## 2018-12-07 DIAGNOSIS — Z79899 Other long term (current) drug therapy: Secondary | ICD-10-CM | POA: Diagnosis not present

## 2018-12-07 DIAGNOSIS — R52 Pain, unspecified: Secondary | ICD-10-CM

## 2018-12-07 DIAGNOSIS — J449 Chronic obstructive pulmonary disease, unspecified: Secondary | ICD-10-CM | POA: Diagnosis not present

## 2018-12-07 DIAGNOSIS — R519 Headache, unspecified: Secondary | ICD-10-CM | POA: Insufficient documentation

## 2018-12-07 LAB — PREGNANCY, URINE: Preg Test, Ur: NEGATIVE

## 2018-12-07 MED ORDER — DIPHENHYDRAMINE HCL 50 MG/ML IJ SOLN
25.0000 mg | Freq: Once | INTRAMUSCULAR | Status: AC
  Administered 2018-12-07: 25 mg via INTRAVENOUS
  Filled 2018-12-07: qty 1

## 2018-12-07 MED ORDER — PROCHLORPERAZINE EDISYLATE 10 MG/2ML IJ SOLN
10.0000 mg | Freq: Once | INTRAMUSCULAR | Status: AC
  Administered 2018-12-07: 10 mg via INTRAVENOUS
  Filled 2018-12-07: qty 2

## 2018-12-07 MED ORDER — SODIUM CHLORIDE 0.9 % IV BOLUS
1000.0000 mL | Freq: Once | INTRAVENOUS | Status: AC
  Administered 2018-12-07: 1000 mL via INTRAVENOUS

## 2018-12-07 MED ORDER — KETOROLAC TROMETHAMINE 30 MG/ML IJ SOLN
30.0000 mg | Freq: Once | INTRAMUSCULAR | Status: AC
  Administered 2018-12-07: 30 mg via INTRAVENOUS
  Filled 2018-12-07: qty 1

## 2018-12-07 MED ORDER — PROCHLORPERAZINE MALEATE 10 MG PO TABS: 10.0000 mg | ORAL_TABLET | Freq: Two times a day (BID) | ORAL | 0 refills | Status: DC | PRN

## 2018-12-07 NOTE — ED Triage Notes (Signed)
Pt states she has neuropathy and her medication is not working  Pt is c/o general body aches  Pt states she also has had a headache for a couple of days

## 2018-12-07 NOTE — ED Provider Notes (Signed)
[facility] EMERGENCY DEPARTMENT Provider Note   CSN: [id number] Arrival date & time: 12/07/18  2033     History   Chief Complaint Chief Complaint  Patient presents with   Generalized Body Aches    HPI Sheila Gibson is a 48 y.o. female.     The history is provided by the patient and medical records. No language interpreter was used.  Headache Pain location:  Generalized Quality:  Dull Radiates to:  Does not radiate Severity currently:  9/10 Severity at highest:  9/10 Onset quality:  Gradual Duration:  2 days Timing:  Constant Progression:  Waxing and waning Chronicity:  Recurrent Similar to prior headaches: yes   Relieved by:  Nothing Worsened by:  Nothing Ineffective treatments:  None tried Associated symptoms: no abdominal pain, no back pain, no congestion, no cough, no diarrhea, no dizziness, no fatigue, no fever, no focal weakness, no nausea, no neck pain, no neck stiffness, no photophobia, no seizures, no vomiting and no weakness     Past Medical History:  Diagnosis Date   COPD (chronic obstructive pulmonary disease) (HCC)    Depression    Diabetes mellitus without complication (HCC)    Heart murmur    Hepatic cirrhosis (HCC)    Hypertension    Obesity    Thyroid disease     Patient Active Problem List   Diagnosis Date Noted   Warts, 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/20/2017   Hypertensive urgency 08/17/2017    Past Surgical History:  Procedure Laterality Date   CESAREAN SECTION     ESOPHAGOGASTRODUODENOSCOPY (EGD) WITH PROPOFOL N/A 09/01/2017   Procedure: ESOPHAGOGASTRODUODENOSCOPY (EGD) WITH PROPOFOL;  Surgeon: Kathi Der, MD;  Location: [facility];  Service: Gastroenterology;  Laterality: N/A;   THYROIDECTOMY       OB History    Gravida  6   Para  5   Term  5   Preterm      AB  1   Living  4     SAB  1   TAB      Ectopic      Multiple      Live Births  4            Home Medications    Prior to  Admission medications   Medication Sig Start Date End Date Taking? Authorizing Provider  acetaminophen (TYLENOL) 500 MG tablet Take 500 mg by mouth daily as needed for moderate pain.    [provider]  albuterol (PROVENTIL HFA;VENTOLIN HFA) 108 (90 BASE) MCG/ACT inhaler Inhale 1-2 puffs into the lungs every 6 (six) hours as needed for wheezing or shortness of breath.    [provider]  clotrimazole (LOTRIMIN) 1 % cream Apply to affected area 2 times daily 03/31/18   Wieters, Hallie C, PA-C  famotidine (PEPCID) 20 MG tablet Take 1 tablet (20 mg total) by mouth at bedtime. 08/20/18   Georgetta Haber, NP  gabapentin (NEURONTIN) 800 MG tablet Take 800 mg by mouth 3 (three) times daily.     [provider]  hydrOXYzine (ATARAX/VISTARIL) 25 MG tablet Take 25 mg by mouth 3 (three) times daily as needed. for anxiety 08/05/17   [provider]  ibuprofen (ADVIL,MOTRIN) 600 MG tablet Take 1 tablet (600 mg total) by mouth every 6 (six) hours as needed. 01/12/18   Wieters, Hallie C, PA-C  insulin aspart protamine- aspart (NOVOLOG MIX 70/30) (70-30) 100 UNIT/ML injection Inject 6 Units into the skin 2 (two) times daily with a meal.  [provider]  insulin glargine (LANTUS) 100 unit/mL SOPN Inject 60 Units into the skin 2 (two) times daily.     [provider]  levothyroxine (SYNTHROID, LEVOTHROID) 50 MCG tablet Take 50 mcg by mouth daily.  07/20/17   [provider]  losartan (COZAAR) 100 MG tablet Take 1 tablet (100 mg total) by mouth daily. 01/18/18   Wallis BambergMani, Mario, PA-C  omeprazole (PRILOSEC) 40 MG capsule Take 1 capsule (40 mg total) by mouth daily. 08/20/18   Georgetta HaberBurky, Natalie B, NP  ondansetron (ZOFRAN ODT) 4 MG disintegrating tablet 4mg  ODT q4 hours prn nausea/vomit 08/27/17   Bethann BerkshireZammit, Joseph, MD  ondansetron (ZOFRAN) 4 MG tablet Take 4 mg by mouth daily as needed for nausea or vomiting.    [provider]  promethazine-dextromethorphan  (PROMETHAZINE-DM) 6.25-15 MG/5ML syrup Take 5 mLs by mouth 3 (three) times daily as needed for cough. 01/18/18   Wallis BambergMani, Mario, PA-C  tenofovir (VIREAD) 300 MG tablet Take 300 mg by mouth daily. 08/03/17   [provider]  tiZANidine (ZANAFLEX) 4 MG tablet Take 4 mg by mouth 3 (three) times daily as needed for muscle spasms.     [provider]  tobramycin (TOBREX) 0.3 % ophthalmic solution Place 1 drop into the right eye every 6 (six) hours. 09/06/18   Mardella LaymanHagler, Brian, MD  triamcinolone cream (KENALOG) 0.1 % Apply 1 application topically 3 (three) times daily. 08/05/17   [provider]  venlafaxine XR (EFFEXOR-XR) 37.5 MG 24 hr capsule Take 37.5 mg by mouth daily. 07/06/17   [provider]  zolpidem (AMBIEN) 10 MG tablet Take 10 mg by mouth at bedtime as needed for sleep.  01/23/17   [provider]  ZOVIRAX 5 % Apply 1 application topically 3 (three) times daily.  04/14/17   [provider]    Family History Family History  Problem Relation Age of Onset   Cancer Mother    Hypertension Other     Social History Social History   Tobacco Use   Smoking status: Never Smoker   Smokeless tobacco: Never Used  Substance Use Topics   Alcohol use: Not Currently   Drug use: No     Allergies   Patient has no known allergies.   Review of Systems Review of Systems  Constitutional: Negative for chills, diaphoresis, fatigue and fever.  HENT: Negative for congestion.   Eyes: Negative for photophobia.  Respiratory: Negative for cough, shortness of breath and wheezing.   Cardiovascular: Negative for chest pain and palpitations.  Gastrointestinal: Negative for abdominal pain, diarrhea, nausea and vomiting.  Genitourinary: Negative for flank pain.  Musculoskeletal: Negative for back pain, neck pain and neck stiffness.  Neurological: Positive for headaches. Negative for dizziness, focal weakness, seizures, weakness and light-headedness.    Psychiatric/Behavioral: Negative for agitation and confusion.  All other systems reviewed and are negative.    Physical Exam Updated Vital Signs BP (!) 184/77 (BP Location: Left Arm)    Pulse (!) 101    Temp 99.8 F (37.7 C) (Oral)    Resp 20    Ht 5\' 7"  (1.702 m)    Wt 136.1 kg    SpO2 99%    BMI 46.99 kg/m   Physical Exam Vitals signs and nursing note reviewed.  Constitutional:      General: She is not in acute distress.    Appearance: She is well-developed. She is obese. She is not ill-appearing, toxic-appearing or diaphoretic.  HENT:     Head: Normocephalic  and atraumatic.     Right Ear: External ear normal.     Left Ear: External ear normal.     Nose: Nose normal. No congestion or rhinorrhea.     Mouth/Throat:     Mouth: Mucous membranes are moist.     Pharynx: No oropharyngeal exudate or posterior oropharyngeal erythema.  Eyes:     Extraocular Movements: Extraocular movements intact.     Conjunctiva/sclera: Conjunctivae normal.     Pupils: Pupils are equal, round, and reactive to light.  Neck:     Musculoskeletal: Normal range of motion and neck supple. No neck rigidity or muscular tenderness.     Vascular: No carotid bruit.  Cardiovascular:     Rate and Rhythm: Normal rate and regular rhythm.     Pulses: Normal pulses.     Heart sounds: Murmur present.  Pulmonary:     Effort: Pulmonary effort is normal. No respiratory distress.     Breath sounds: No stridor. No wheezing, rhonchi or rales.  Chest:     Chest wall: No tenderness.  Abdominal:     General: There is no distension.     Tenderness: There is no abdominal tenderness. There is no right CVA tenderness, left CVA tenderness, guarding or rebound.  Musculoskeletal:        General: No tenderness.     Right lower leg: No edema.     Left lower leg: No edema.  Skin:    General: Skin is warm.     Capillary Refill: Capillary refill takes less than 2 seconds.     Findings: No erythema or rash.  Neurological:      General: No focal deficit present.     Mental Status: She is alert and oriented to person, place, and time.     Cranial Nerves: No cranial nerve deficit.     Sensory: No sensory deficit.     Motor: No weakness or abnormal muscle tone.     Coordination: Coordination normal.     Deep Tendon Reflexes: Reflexes are normal and symmetric.  Psychiatric:        Mood and Affect: Mood normal.      ED Treatments / Results  Labs (all labs ordered are listed, but only abnormal results are displayed) Labs Reviewed  PREGNANCY, URINE    EKG None  Radiology No results found.  Procedures Procedures (including critical care time)  Medications Ordered in ED Medications  sodium chloride 0.9 % bolus 1,000 mL (1,000 mLs Intravenous New Bag/Given 12/07/18 2257)  prochlorperazine (COMPAZINE) injection 10 mg (10 mg Intravenous Given 12/07/18 2257)  diphenhydrAMINE (BENADRYL) injection 25 mg (25 mg Intravenous Given 12/07/18 2257)  ketorolac (TORADOL) 30 MG/ML injection 30 mg (30 mg Intravenous Given 12/07/18 2257)     Initial Impression / Assessment and Plan / ED Course  I have reviewed the triage vital signs and the nursing notes.  Pertinent labs & imaging results that were available during my care of the patient were reviewed by me and considered in my medical decision making (see chart for details).        [address] Duby is a 48 y.o. female with a past medical history significant for COPD, diabetes, hypertension, thyroid disease, hepatic cirrhosis, heart murmur, and depression who presents with missed menstrual cycles x2, nausea, headache, and neuropathy pains.  Patient reports that she thinks her neuropathy pains are worse due to the temperature changes over the weekend.  She reports he is having pain in all extremities consistent with  her exacerbated neuropathy pains.  She reports she takes gabapentin and Zanaflex and they have not been working.  She reports that she missed her  menstrual cycle for the last 2 months and was sexually active.  She would like to be checked to see if she is pregnant.  She denies abdominal pain, vaginal bleeding, or vaginal discharge.  She denies chest pain, shortness breath or palpitations.  She denies neck pain, neck stiffness, or back pain.  She reports 8 out of 10 headache that is diffuse and all over.  She denies significant photophobia but she reports that she has had similar headache in the past but the headache cocktail fixed.  On exam, abdomen is nontender.  Lungs are clear.  Chest is nontender.  No wheezing.  No focal neurologic deficits.  Pupils are symmetric and reactive with normal extraocular movements.  Neck is not stiff and not tender.  Patient resting comfortably during exam.  Clinical aspect patient has a migrainous headache exacerbating her overall aches and pains.  We will get a pregnancy test initially.  If Thelma Barge is negative, anticipate headache cocktail to try and alleviate her headache and discussed medications to go home with for her neuropathy exacerbation.  Given her lack of fevers, chills, neck pain, neck stiffness, urinary symptoms or GI symptoms, do not feel she needs imaging or laboratory testing at this time.  We will try and alleviate her symptoms after checking pregnancy.  Anticipate reassessment after work-up.  Patient is feeling better, dissipate discharge home.  11:45 PM Pregnancy test was negative.  She was given a headache cocktail.  She reports her headache is completely resolved and so has her neuropathy pains.  She is feeling much better.  Patient completed her fluids and is not feeling dehydrated.  Patient appears very well.  Patient was discharged home with prescription for Compazine to help with headache, nausea, and other symptoms.  She was given instructions to take Benadryl she starts having akathisia or other symptoms.  She agrees with plan of care and follow-up instructions.  She was given return  precautions and instructions follow-up with PCP.  She no other questions or concerns and was discharged in good condition with improvement in symptoms.    Final Clinical Impressions(s) / ED Diagnoses   Final diagnoses:  Body aches  Bad headache  Diabetic neuropathy, painful [facility])    ED Discharge Orders         Ordered    prochlorperazine (COMPAZINE) 10 MG tablet  2 times daily PRN     12/07/18 2346          Clinical Impression: 1. Body aches   2. Bad headache   3. Diabetic neuropathy, painful (HCC)     Disposition: Discharge  Condition: Good  I have discussed the results, Dx and Tx plan with the pt(& family if present). He/she/they expressed understanding and agree(s) with the plan. Discharge instructions discussed at great length. Strict return precautions discussed and pt &/or family have verbalized understanding of the instructions. No further questions at time of discharge.    New Prescriptions   PROCHLORPERAZINE (COMPAZINE) 10 MG TABLET    Take 1 tablet (10 mg total) by mouth 2 (two) times daily as needed for nausea or vomiting.    Follow Up: Alveta Heimlich, FNP [address] [place] [address] [phone]     [facility] [address] [id number] mc [address] [place] [address] [phone]       Haruka Kowaleski,  Canary Brim, MD 12/08/18 [phone]

## 2018-12-07 NOTE — Discharge Instructions (Signed)
Your history and exam today was overall reassuring with no focal deficits and your symptoms were consistent with prior neuropathy flares.  Given your headache, we gave you a headache cocktail with resolution of the headache and neuropathy symptoms.  You are not pregnant.  As you are feeling better after medications and fluids, we feel you are safe for discharge home.  Please use the Compazine to help with headache, nausea, and your symptoms.  You may use Benadryl if you get other symptoms such as feeling agitated or restless.  Please follow-up with your primary doctor and continue your home medications.  Please stay hydrated and follow your glucoses.  If any symptoms change or worsen, please return to the nearest emergency department immediately.

## 2018-12-27 ENCOUNTER — Ambulatory Visit: Payer: Medicaid Other | Admitting: Obstetrics

## 2019-01-11 ENCOUNTER — Ambulatory Visit: Payer: Medicaid Other | Admitting: Obstetrics

## 2019-01-12 ENCOUNTER — Ambulatory Visit: Payer: Medicaid Other | Admitting: Obstetrics

## 2019-02-08 IMAGING — CT CT ANGIO NECK
2 of 12 series · 6 of 33 positions shown · IV contrast (ISOVUE)
Comparison: 11/25/2016

CLINICAL DATA: Retro-orbital headache on the right. Blurred vision.

EXAM:
CT ANGIOGRAPHY HEAD AND NECK
TECHNIQUE: Multidetector CT imaging of the head and neck was performed using
the standard protocol during bolus administration of intravenous
contrast. Multiplanar CT image reconstructions and MIPs were
obtained to evaluate the vascular anatomy. Carotid stenosis
measurements (when applicable) are obtained utilizing NASCET
criteria, using the distal internal carotid diameter as the
denominator.
CONTRAST:  100mL 90G4NQ-GBF IOPAMIDOL (90G4NQ-GBF) INJECTION 76%

[Series 11: cta head neck thins · axial · 0.39mm/px · z∈[-209,-22]mm · 4 of 624 slices shown]
[im 125/624  soft-tissue]
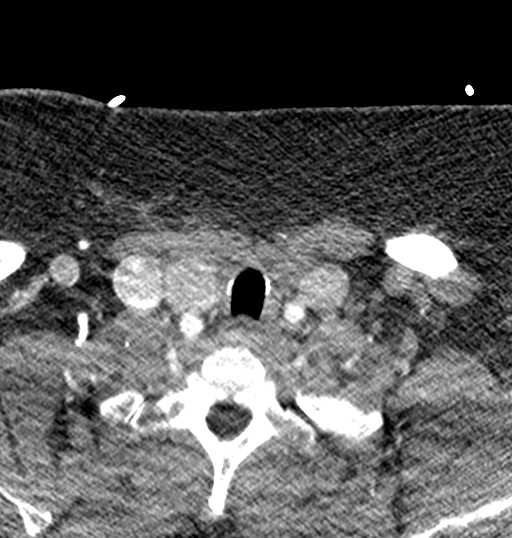
[im 250/624  bone]
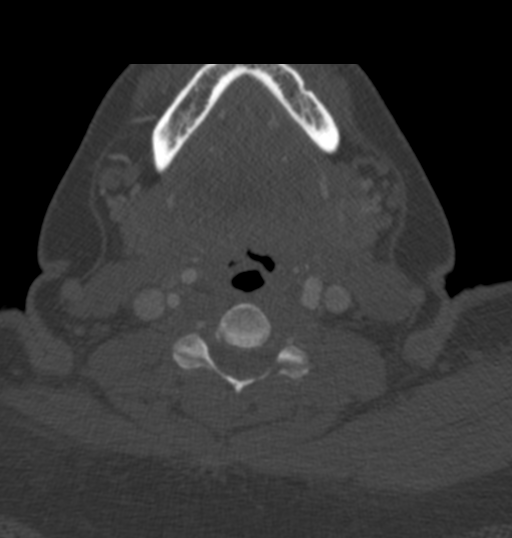
[im 374/624  soft-tissue]
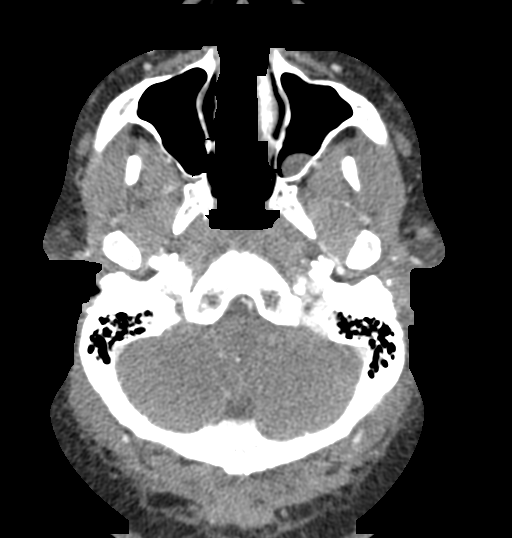
[im 499/624  bone]
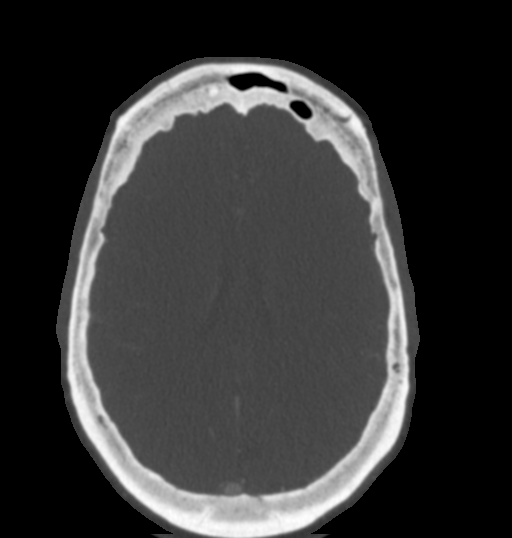

[Series 19: ax thin · axial · 0.39mm/px · z∈[-176,-67]mm · 2 of 328 slices shown]
[im 110/328  soft-tissue]
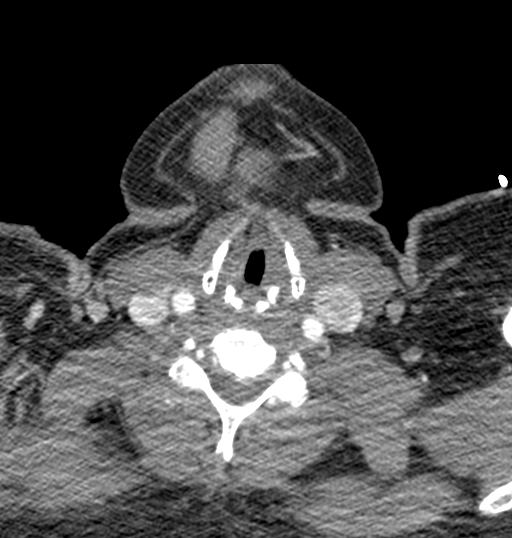
[im 219/328  soft-tissue]
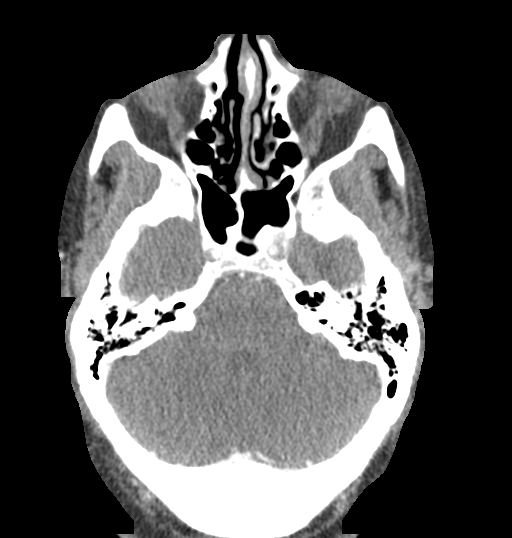

[6 of 33 positions shown; findings below may reference images not displayed]

FINDINGS: CT HEAD FINDINGS

Brain: Normal appearance of the brain. No evidence of old or acute
infarction, mass lesion, hemorrhage, hydrocephalus or extra-axial
collection.

Vascular: No abnormal vascular finding without contrast.

Skull: Normal

Sinuses: No evidence of sinusitis. Few retention cyst in the left
maxillary sinus. Unerupted molar tooth of the left maxilla.

Orbits: No abnormal orbital finding. Both globes appear normal. By
history there is been recent procedure performed on the right, but
no complication is evident related to that.

Review of the MIP images confirms the above findings

CTA NECK FINDINGS

Aortic arch: Normal

Right carotid system: Common carotid artery widely patent to the
bifurcation. The carotid bifurcation is normal. Cervical internal
carotid artery is normal.

Left carotid system: Common carotid artery widely patent to the
bifurcation. Carotid bifurcation is normal. Cervical internal
carotid artery is normal.

Vertebral arteries: Vertebral arteries are not well seen because of
motion and patient's size. I believe the vertebral arteries are
normal, widely patent at their origin and through the cervical
region to the foramen magnum. Posterior circulation is somewhat
diminutive secondary to fetal origin of both posterior cerebral
arteries.

Skeleton: Normal except for lower cervical facet osteoarthritis.

Other neck: 3.7 cm mass in the low right neck, unchanged since 5218
and likely related to a thyroid nodule.

Upper chest: Normal

Review of the MIP images confirms the above findings

CTA HEAD FINDINGS

Anterior circulation: Both internal carotid arteries are patent
through the skull base and siphon regions. The anterior and middle
cerebral vessels appear normal without stenosis or occlusion,
aneurysm or vascular malformation. No abnormal vascular finding
relating to the right orbit.

Posterior circulation: Diminutive posterior circulation because of
fetal origin of the posterior cerebral arteries. No evidence of
posterior circulation stenosis or occlusion.

Venous sinuses: Patent and normal.

Anatomic variants: No abnormal variant.

Delayed phase: No abnormal enhancement.

Review of the MIP images confirms the above findings
IMPRESSION: SOME:
IMPRESSION: SOME
No vascular abnormality to explain retro-orbital headache.

Normal appearance of the brain. I do not discern any abnormality of
the right orbit, despite the history of a recent surgical procedure.

3.7 cm mass in the low right neck presumed to derive from the
thyroid gland. Has this been previously evaluated? I think the
patient has had previous thyroidectomy related to goiter and this
could be some residual thyroid tissue. It is unchanged since a CT
scan of 5218 and therefore is likely benign.

## 2019-04-28 ENCOUNTER — Other Ambulatory Visit: Payer: Self-pay

## 2019-04-28 ENCOUNTER — Emergency Department (HOSPITAL_COMMUNITY): Admission: EM | Admit: 2019-04-28 | Payer: Medicaid Other | Source: Home / Self Care

## 2019-05-10 ENCOUNTER — Encounter (HOSPITAL_COMMUNITY): Payer: Self-pay | Admitting: *Deleted

## 2019-05-10 ENCOUNTER — Inpatient Hospital Stay (HOSPITAL_COMMUNITY)
Admission: EM | Admit: 2019-05-10 | Discharge: 2019-05-12 | DRG: 291 | Disposition: A | Discharge status: Home / Self Care | Payer: Medicaid Other | Attending: Internal Medicine | Admitting: Internal Medicine

## 2019-05-10 ENCOUNTER — Other Ambulatory Visit: Payer: Self-pay

## 2019-05-10 ENCOUNTER — Emergency Department (HOSPITAL_COMMUNITY): Payer: Medicaid Other

## 2019-05-10 ENCOUNTER — Inpatient Hospital Stay (HOSPITAL_COMMUNITY): Payer: Medicaid Other

## 2019-05-10 DIAGNOSIS — I509 Heart failure, unspecified: Secondary | ICD-10-CM

## 2019-05-10 DIAGNOSIS — K7460 Unspecified cirrhosis of liver: Secondary | ICD-10-CM | POA: Diagnosis present

## 2019-05-10 DIAGNOSIS — Z79899 Other long term (current) drug therapy: Secondary | ICD-10-CM | POA: Diagnosis not present

## 2019-05-10 DIAGNOSIS — I5032 Chronic diastolic (congestive) heart failure: Secondary | ICD-10-CM | POA: Diagnosis present

## 2019-05-10 DIAGNOSIS — J9601 Acute respiratory failure with hypoxia: Secondary | ICD-10-CM | POA: Diagnosis present

## 2019-05-10 DIAGNOSIS — E1165 Type 2 diabetes mellitus with hyperglycemia: Secondary | ICD-10-CM | POA: Diagnosis present

## 2019-05-10 DIAGNOSIS — E559 Vitamin D deficiency, unspecified: Secondary | ICD-10-CM | POA: Diagnosis present

## 2019-05-10 DIAGNOSIS — Z794 Long term (current) use of insulin: Secondary | ICD-10-CM | POA: Diagnosis not present

## 2019-05-10 DIAGNOSIS — E039 Hypothyroidism, unspecified: Secondary | ICD-10-CM | POA: Diagnosis present

## 2019-05-10 DIAGNOSIS — I110 Hypertensive heart disease with heart failure: Principal | ICD-10-CM | POA: Diagnosis present

## 2019-05-10 DIAGNOSIS — E119 Type 2 diabetes mellitus without complications: Secondary | ICD-10-CM

## 2019-05-10 DIAGNOSIS — J449 Chronic obstructive pulmonary disease, unspecified: Secondary | ICD-10-CM | POA: Diagnosis present

## 2019-05-10 DIAGNOSIS — Z6841 Body Mass Index (BMI) 40.0 and over, adult: Secondary | ICD-10-CM | POA: Diagnosis not present

## 2019-05-10 DIAGNOSIS — Z809 Family history of malignant neoplasm, unspecified: Secondary | ICD-10-CM

## 2019-05-10 DIAGNOSIS — D649 Anemia, unspecified: Secondary | ICD-10-CM

## 2019-05-10 DIAGNOSIS — R748 Abnormal levels of other serum enzymes: Secondary | ICD-10-CM

## 2019-05-10 DIAGNOSIS — R778 Other specified abnormalities of plasma proteins: Secondary | ICD-10-CM

## 2019-05-10 DIAGNOSIS — D509 Iron deficiency anemia, unspecified: Secondary | ICD-10-CM | POA: Diagnosis present

## 2019-05-10 DIAGNOSIS — G4733 Obstructive sleep apnea (adult) (pediatric): Secondary | ICD-10-CM | POA: Diagnosis present

## 2019-05-10 DIAGNOSIS — Z79890 Hormone replacement therapy (postmenopausal): Secondary | ICD-10-CM

## 2019-05-10 DIAGNOSIS — N920 Excessive and frequent menstruation with regular cycle: Secondary | ICD-10-CM | POA: Diagnosis present

## 2019-05-10 DIAGNOSIS — Z20822 Contact with and (suspected) exposure to covid-19: Secondary | ICD-10-CM | POA: Diagnosis present

## 2019-05-10 DIAGNOSIS — R0602 Shortness of breath: Secondary | ICD-10-CM | POA: Diagnosis present

## 2019-05-10 DIAGNOSIS — R739 Hyperglycemia, unspecified: Secondary | ICD-10-CM | POA: Diagnosis not present

## 2019-05-10 DIAGNOSIS — D631 Anemia in chronic kidney disease: Secondary | ICD-10-CM

## 2019-05-10 DIAGNOSIS — I5031 Acute diastolic (congestive) heart failure: Secondary | ICD-10-CM | POA: Diagnosis not present

## 2019-05-10 DIAGNOSIS — Z9119 Patient's noncompliance with other medical treatment and regimen: Secondary | ICD-10-CM | POA: Diagnosis not present

## 2019-05-10 DIAGNOSIS — E1159 Type 2 diabetes mellitus with other circulatory complications: Secondary | ICD-10-CM | POA: Diagnosis not present

## 2019-05-10 LAB — COMPREHENSIVE METABOLIC PANEL
ALT: 28 U/L (ref 0–44)
AST: 41 U/L (ref 15–41)
Albumin: 2.5 g/dL — ABNORMAL LOW (ref 3.5–5.0)
Alkaline Phosphatase: 141 U/L — ABNORMAL HIGH (ref 38–126)
Anion gap: 6 (ref 5–15)
BUN: 12 mg/dL (ref 6–20)
CO2: 21 mmol/L — ABNORMAL LOW (ref 22–32)
Calcium: 7.7 mg/dL — ABNORMAL LOW (ref 8.9–10.3)
Chloride: 110 mmol/L (ref 98–111)
Creatinine, Ser: 1.08 mg/dL — ABNORMAL HIGH (ref 0.44–1.00)
GFR calc Af Amer: >60 mL/min (ref >60)
GFR calc non Af Amer: >60 mL/min (ref >60)
Glucose, Bld: 264 mg/dL — ABNORMAL HIGH (ref 70–99)
Potassium: 3.6 mmol/L (ref 3.5–5.1)
Sodium: 137 mmol/L (ref 135–145)
Total Bilirubin: 0.8 mg/dL (ref 0.3–1.2)
Total Protein: 8.2 g/dL — ABNORMAL HIGH (ref 6.5–8.1)

## 2019-05-10 LAB — CBC WITH DIFFERENTIAL/PLATELET
Abs Immature Granulocytes: 0.06 10*3/uL (ref 0.00–0.07)
Basophils Absolute: 0 10*3/uL (ref 0.0–0.1)
Basophils Relative: 1 %
Eosinophils Absolute: 0.2 10*3/uL (ref 0.0–0.5)
Eosinophils Relative: 3 %
HCT: 22.5 % — ABNORMAL LOW (ref 36.0–46.0)
Hemoglobin: 6.7 g/dL — CL (ref 12.0–15.0)
Immature Granulocytes: 1 %
Lymphocytes Relative: 28 %
Lymphs Abs: 2.2 10*3/uL (ref 0.7–4.0)
MCH: 24.6 pg — ABNORMAL LOW (ref 26.0–34.0)
MCHC: 29.8 g/dL — ABNORMAL LOW (ref 30.0–36.0)
MCV: 82.7 fL (ref 80.0–100.0)
Monocytes Absolute: 0.9 10*3/uL (ref 0.1–1.0)
Monocytes Relative: 12 %
Neutro Abs: 4.5 10*3/uL (ref 1.7–7.7)
Neutrophils Relative %: 55 %
Platelets: 161 10*3/uL (ref 150–400)
RBC: 2.72 MIL/uL — ABNORMAL LOW (ref 3.87–5.11)
RDW: 17.1 % — ABNORMAL HIGH (ref 11.5–15.5)
WBC Morphology: >10
WBC: 7.9 10*3/uL (ref 4.0–10.5)
nRBC: 0 % (ref 0.0–0.2)

## 2019-05-10 LAB — ECHOCARDIOGRAM COMPLETE
Height: 67 in
Weight: 4960 oz

## 2019-05-10 LAB — CBC
HCT: 25.1 % — ABNORMAL LOW (ref 36.0–46.0)
Hemoglobin: 7.6 g/dL — ABNORMAL LOW (ref 12.0–15.0)
MCH: 25 pg — ABNORMAL LOW (ref 26.0–34.0)
MCHC: 30.3 g/dL (ref 30.0–36.0)
MCV: 82.6 fL (ref 80.0–100.0)
Platelets: 179 10*3/uL (ref 150–400)
RBC: 3.04 MIL/uL — ABNORMAL LOW (ref 3.87–5.11)
RDW: 16.7 % — ABNORMAL HIGH (ref 11.5–15.5)
WBC: 8.1 10*3/uL (ref 4.0–10.5)
nRBC: 0 % (ref 0.0–0.2)

## 2019-05-10 LAB — POC OCCULT BLOOD, ED: Fecal Occult Bld: NEGATIVE

## 2019-05-10 LAB — HEMOGLOBIN AND HEMATOCRIT, BLOOD
HCT: 22.4 % — ABNORMAL LOW (ref 36.0–46.0)
Hemoglobin: 6.6 g/dL — CL (ref 12.0–15.0)

## 2019-05-10 LAB — GLUCOSE, CAPILLARY
Glucose-Capillary: 151 mg/dL — ABNORMAL HIGH (ref 70–99)
Glucose-Capillary: 134 mg/dL — ABNORMAL HIGH (ref 70–99)

## 2019-05-10 LAB — HIV ANTIBODY (ROUTINE TESTING W REFLEX): HIV Screen 4th Generation wRfx: NONREACTIVE

## 2019-05-10 LAB — IRON AND TIBC
Iron: 16 ug/dL — ABNORMAL LOW (ref 28–170)
Saturation Ratios: 4 % — ABNORMAL LOW (ref 10.4–31.8)
TIBC: 382 ug/dL (ref 250–450)
UIBC: 366 ug/dL

## 2019-05-10 LAB — FOLATE: Folate: 6.9 ng/mL (ref >5.9)

## 2019-05-10 LAB — RETICULOCYTES
Immature Retic Fract: 22.3 % — ABNORMAL HIGH (ref 2.3–15.9)
RBC.: 2.66 MIL/uL — ABNORMAL LOW (ref 3.87–5.11)
Retic Count, Absolute: 56.4 10*3/uL (ref 19.0–186.0)
Retic Ct Pct: 2.1 % (ref 0.4–3.1)

## 2019-05-10 LAB — TROPONIN I (HIGH SENSITIVITY)
Troponin I (High Sensitivity): 22 ng/L — ABNORMAL HIGH (ref <18)
Troponin I (High Sensitivity): 22 ng/L — ABNORMAL HIGH (ref <18)

## 2019-05-10 LAB — HEMOGLOBIN A1C
Hgb A1c MFr Bld: 9.3 % — ABNORMAL HIGH (ref 4.8–5.6)
Mean Plasma Glucose: 220.21 mg/dL

## 2019-05-10 LAB — RESPIRATORY PANEL BY RT PCR (FLU A&B, COVID)
Influenza A by PCR: NEGATIVE
Influenza B by PCR: NEGATIVE
SARS Coronavirus 2 by RT PCR: NEGATIVE

## 2019-05-10 LAB — TSH: TSH: 9.846 u[IU]/mL — ABNORMAL HIGH (ref 0.350–4.500)

## 2019-05-10 LAB — CBG MONITORING, ED
Glucose-Capillary: 149 mg/dL — ABNORMAL HIGH (ref 70–99)
Glucose-Capillary: 75 mg/dL (ref 70–99)

## 2019-05-10 LAB — VITAMIN B12: Vitamin B-12: 839 pg/mL (ref 180–914)

## 2019-05-10 LAB — FERRITIN: Ferritin: 9 ng/mL — ABNORMAL LOW (ref 11–307)

## 2019-05-10 LAB — ABO/RH: ABO/RH(D): A POS

## 2019-05-10 LAB — PREPARE RBC (CROSSMATCH)

## 2019-05-10 LAB — BRAIN NATRIURETIC PEPTIDE: B Natriuretic Peptide: 142.9 pg/mL — ABNORMAL HIGH (ref 0.0–100.0)

## 2019-05-10 LAB — MAGNESIUM: Magnesium: 1.9 mg/dL (ref 1.7–2.4)

## 2019-05-10 MED ORDER — SODIUM CHLORIDE 0.9 % IV SOLN
250.0000 mL | INTRAVENOUS | Status: DC | PRN
  Administered 2019-05-11: 250 mL via INTRAVENOUS

## 2019-05-10 MED ORDER — INSULIN GLARGINE 100 UNIT/ML ~~LOC~~ SOLN
60.0000 [IU] | Freq: Two times a day (BID) | SUBCUTANEOUS | Status: DC
  Administered 2019-05-10: 60 [IU] via SUBCUTANEOUS
  Filled 2019-05-10: qty 0.6

## 2019-05-10 MED ORDER — IPRATROPIUM-ALBUTEROL 0.5-2.5 (3) MG/3ML IN SOLN: 3.0000 mL | Freq: Four times a day (QID) | RESPIRATORY_TRACT | Status: DC | PRN

## 2019-05-10 MED ORDER — GABAPENTIN 400 MG PO CAPS
800.0000 mg | ORAL_CAPSULE | Freq: Three times a day (TID) | ORAL | Status: DC
  Administered 2019-05-10 – 2019-05-12 (×6): 800 mg via ORAL
  Filled 2019-05-10 (×6): qty 2

## 2019-05-10 MED ORDER — POTASSIUM CHLORIDE CRYS ER 20 MEQ PO TBCR
40.0000 meq | EXTENDED_RELEASE_TABLET | Freq: Once | ORAL | Status: AC
  Administered 2019-05-10: 40 meq via ORAL
  Filled 2019-05-10: qty 2

## 2019-05-10 MED ORDER — INSULIN ASPART 100 UNIT/ML ~~LOC~~ SOLN
0.0000 [IU] | Freq: Three times a day (TID) | SUBCUTANEOUS | Status: DC
  Administered 2019-05-10: 2 [IU] via SUBCUTANEOUS
  Administered 2019-05-10 – 2019-05-12 (×2): 1 [IU] via SUBCUTANEOUS
  Filled 2019-05-10: qty 0.09

## 2019-05-10 MED ORDER — INSULIN GLARGINE 100 UNIT/ML ~~LOC~~ SOLN
60.0000 [IU] | Freq: Every day | SUBCUTANEOUS | Status: DC
  Administered 2019-05-11: 30 [IU] via SUBCUTANEOUS
  Filled 2019-05-10: qty 0.6

## 2019-05-10 MED ORDER — FUROSEMIDE 10 MG/ML IJ SOLN
40.0000 mg | Freq: Two times a day (BID) | INTRAMUSCULAR | Status: DC
  Administered 2019-05-10 – 2019-05-12 (×5): 40 mg via INTRAVENOUS
  Filled 2019-05-10 (×5): qty 4

## 2019-05-10 MED ORDER — TENOFOVIR DISOPROXIL FUMARATE 300 MG PO TABS
300.0000 mg | ORAL_TABLET | Freq: Every day | ORAL | Status: DC
  Administered 2019-05-10 – 2019-05-12 (×3): 300 mg via ORAL
  Filled 2019-05-10 (×3): qty 1

## 2019-05-10 MED ORDER — ENOXAPARIN SODIUM 80 MG/0.8ML ~~LOC~~ SOLN
70.0000 mg | SUBCUTANEOUS | Status: DC
  Administered 2019-05-10 – 2019-05-12 (×3): 70 mg via SUBCUTANEOUS
  Filled 2019-05-10: qty 0.7
  Filled 2019-05-10 (×2): qty 0.8

## 2019-05-10 MED ORDER — TRAZODONE HCL 100 MG PO TABS
100.0000 mg | ORAL_TABLET | Freq: Every evening | ORAL | Status: DC | PRN
  Administered 2019-05-10 – 2019-05-11 (×2): 100 mg via ORAL
  Filled 2019-05-10 (×2): qty 1

## 2019-05-10 MED ORDER — MAGNESIUM SULFATE IN D5W 1-5 GM/100ML-% IV SOLN
1.0000 g | Freq: Once | INTRAVENOUS | Status: AC
  Administered 2019-05-10: 1 g via INTRAVENOUS
  Filled 2019-05-10: qty 100

## 2019-05-10 MED ORDER — NITROGLYCERIN 2 % TD OINT
1.0000 [in_us] | TOPICAL_OINTMENT | Freq: Once | TRANSDERMAL | Status: AC
  Administered 2019-05-10: 1 [in_us] via TOPICAL

## 2019-05-10 MED ORDER — SODIUM CHLORIDE 0.9% FLUSH
3.0000 mL | Freq: Two times a day (BID) | INTRAVENOUS | Status: DC
  Administered 2019-05-10 – 2019-05-12 (×5): 3 mL via INTRAVENOUS

## 2019-05-10 MED ORDER — FUROSEMIDE 10 MG/ML IJ SOLN
40.0000 mg | Freq: Once | INTRAMUSCULAR | Status: AC
  Administered 2019-05-10: 40 mg via INTRAVENOUS
  Filled 2019-05-10: qty 4

## 2019-05-10 MED ORDER — ACETAMINOPHEN 325 MG PO TABS
650.0000 mg | ORAL_TABLET | ORAL | Status: DC | PRN
  Administered 2019-05-10: 650 mg via ORAL
  Filled 2019-05-10: qty 2

## 2019-05-10 MED ORDER — TIZANIDINE HCL 4 MG PO TABS
4.0000 mg | ORAL_TABLET | Freq: Three times a day (TID) | ORAL | Status: DC | PRN
  Administered 2019-05-10 – 2019-05-11 (×2): 4 mg via ORAL
  Filled 2019-05-10 (×3): qty 1

## 2019-05-10 MED ORDER — SODIUM CHLORIDE 0.9 % IV SOLN
10.0000 mL/h | Freq: Once | INTRAVENOUS | Status: AC
  Administered 2019-05-10: 10 mL/h via INTRAVENOUS

## 2019-05-10 MED ORDER — LEVOTHYROXINE SODIUM 50 MCG PO TABS
50.0000 ug | ORAL_TABLET | Freq: Every day | ORAL | Status: DC
  Administered 2019-05-10 – 2019-05-11 (×2): 50 ug via ORAL
  Filled 2019-05-10 (×2): qty 1

## 2019-05-10 MED ORDER — LOSARTAN POTASSIUM 50 MG PO TABS
100.0000 mg | ORAL_TABLET | Freq: Every day | ORAL | Status: DC
  Administered 2019-05-10 – 2019-05-12 (×3): 100 mg via ORAL
  Filled 2019-05-10 (×4): qty 2

## 2019-05-10 MED ORDER — INSULIN ASPART 100 UNIT/ML ~~LOC~~ SOLN
0.0000 [IU] | Freq: Every day | SUBCUTANEOUS | Status: DC
  Filled 2019-05-10: qty 0.05

## 2019-05-10 MED ORDER — SODIUM CHLORIDE 0.9% FLUSH: 3.0000 mL | INTRAVENOUS | Status: DC | PRN

## 2019-05-10 NOTE — H&P (Signed)
History and Physical    Sheila Gibson QZE:[id number] DOB: 1970/06/04 DOA: 05/10/2019  PCP: Lorayne Bender, FNP Patient coming from: Home  Chief Complaint: Shortness of breath  HPI: Sheila Gibson is a 49 y.o. 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  Patient states she has had worsening shortness of breath for the past 2 weeks.  She has been experiencing shortness of breath even with minimal exertion at home.  She has also had episodes of substernal chest pressure with exertion almost every day for the past 2 weeks, most recent episode was 2 days ago.  Yesterday she was in bed all day and did not move much.  She has also had a cough.  Reports abdominal distention and waking up in the middle of the night feeling short of breath.  She has been gaining weight.  Denies fevers, night sweats, or bone pain.  States she noticed some bright red blood with her stool 2 weeks ago but no further episodes since then.  Denies melena or hematemesis.  ED Course: Afebrile.  Slightly tachycardic on arrival.  Tachypneic but not hypoxic.   Labs showing no leukocytosis.  Hemoglobin 6.7, previously ranging between 10.8-11.9 in 2019.  No recent labs for comparison.  MCV 82.7.  Rectal exam with light-colored stool and Hemoccult negative.  Anemia panel pending.  Platelet count normal.  Bicarb 21, anion gap 6.  Blood glucose 264.  Creatinine 1.0, stable since labs done in September 2019.  Alk phos 141, improved compared to prior labs.  Remainder of LFTs normal.  BNP 142.  Initial high-sensitivity troponin 22, repeat pending.  EKG without acute ischemic changes.  SARS-CoV-2 PCR test negative. Chest x-ray showing cardiomegaly and mild vascular congestion.  Patient was given IV Lasix 40 mg and  nitroglycerin ointment.  2 unit PRBCs ordered.  Review of Systems:  All systems reviewed and apart from history of presenting illness, are negative.  Past Medical History:  Diagnosis Date  . COPD (chronic obstructive pulmonary disease) ([place])   . Depression   . Diabetes mellitus without complication ([place])   . Heart murmur   . Hepatic cirrhosis ([place])   . Hypertension   . Obesity   . Thyroid disease     Past Surgical History:  Procedure Laterality Date  . CESAREAN SECTION    . ESOPHAGOGASTRODUODENOSCOPY (EGD) WITH PROPOFOL N/A 09/01/2017   Procedure: ESOPHAGOGASTRODUODENOSCOPY (EGD) WITH PROPOFOL;  Surgeon: Otis Brace, MD;  Location: [facility];  Service: Gastroenterology;  Laterality: N/A;  . THYROIDECTOMY       reports that she has never smoked. She has never used smokeless tobacco. She reports previous alcohol use. She reports that she does not use drugs.  No Known Allergies  Family History  Problem Relation Age of Onset  . Cancer Mother   . Hypertension Other     Prior to Admission medications   Medication Sig Start Date End Date Taking? Authorizing Provider  acetaminophen (TYLENOL) 500 MG tablet Take 500 mg by mouth daily as needed for moderate pain.   Yes [provider]  albuterol (PROVENTIL HFA;VENTOLIN HFA) 108 (90 BASE) MCG/ACT inhaler Inhale 1-2 puffs into the lungs every 6 (six) hours as needed for wheezing or shortness of breath.   Yes [provider]  gabapentin (NEURONTIN) 800 MG tablet Take 800 mg by mouth 3 (three)  times daily.    Yes [provider]  hydrOXYzine (ATARAX/VISTARIL) 25 MG tablet Take 25 mg by mouth 3 (three) times daily as needed. for anxiety 08/05/17  Yes [provider]  insulin glargine (LANTUS) 100 unit/mL SOPN Inject 60 Units into the skin 2 (two) times daily.    Yes [provider]  levothyroxine (SYNTHROID, LEVOTHROID) 50 MCG tablet Take 50 mcg by mouth daily.  07/20/17  Yes [provider]  losartan (COZAAR) 100 MG tablet Take 1 tablet (100 mg total) by mouth daily. 01/18/18  Yes Jaynee Eagles, PA-C  tenofovir (VIREAD) 300 MG tablet Take 300 mg by mouth daily. 08/03/17  Yes [provider]  tiZANidine (ZANAFLEX) 4 MG tablet Take 4 mg by mouth 3 (three) times daily as needed for muscle spasms.    Yes [provider]  zolpidem (AMBIEN) 10 MG tablet Take 10 mg by mouth at bedtime as needed for sleep.  01/23/17  Yes [provider]  clotrimazole (LOTRIMIN) 1 % cream Apply to affected area 2 times daily Patient not taking: Reported on 05/10/2019 03/31/18   Wieters, Hallie C, PA-C  famotidine (PEPCID) 20 MG tablet Take 1 tablet (20 mg total) by mouth at bedtime. Patient not taking: Reported on 05/10/2019 08/20/18   Augusto Gamble B, NP  ibuprofen (ADVIL,MOTRIN) 600 MG tablet Take 1 tablet (600 mg total) by mouth every 6 (six) hours as needed. Patient not taking: Reported on 05/10/2019 01/12/18   Wieters, Madelynn Done C, PA-C  omeprazole (PRILOSEC) 40 MG capsule Take 1 capsule (40 mg total) by mouth daily. Patient not taking: Reported on 05/10/2019 08/20/18   Augusto Gamble B, NP  ondansetron (ZOFRAN ODT) 4 MG disintegrating tablet 10m ODT q4 hours prn nausea/vomit Patient not taking: Reported on 05/10/2019 08/27/17   ZMilton Ferguson MD  prochlorperazine (COMPAZINE) 10 MG tablet Take 1 tablet (10 mg total) by mouth 2 (two) times daily as needed for nausea or vomiting. Patient not taking: Reported on 05/10/2019 12/07/18   Tegeler, CGwenyth Allegra MD  promethazine-dextromethorphan (PROMETHAZINE-DM) 6.25-15 MG/5ML syrup Take 5 mLs by mouth 3 (three) times daily as needed for cough. Patient not taking: Reported on 05/10/2019 01/18/18   MJaynee Eagles PA-C  tobramycin (TOBREX) 0.3 % ophthalmic solution Place 1 drop into the right eye every 6 (six) hours. Patient not taking: Reported on 05/10/2019 09/06/18   HVanessa Kick MD    Physical Exam: Vitals:   05/10/19 0147 05/10/19 0400  05/10/19 0402 05/10/19 0521  BP: 123/72 135/74 135/74 (!) 151/82  Pulse: 93 94 94 91  Resp: (!) 31  (!) 23 (!) 21  Temp:      TempSrc:      SpO2: 99% 99% 99% 100%  Weight:      Height:        Physical Exam  Constitutional: She is oriented to person, place, and time. She appears well-developed and well-nourished.  HENT:  Head: Normocephalic.  Eyes: Right eye exhibits no discharge. Left eye exhibits no discharge.  Neck: JVD present.  Cardiovascular: Normal rate, regular rhythm and intact distal pulses.  Pulmonary/Chest: She has no wheezes.  Tachypneic with respiratory rate in the 30s Examination limited secondary to patient's large body habitus  Abdominal: Soft. Bowel sounds are normal. She exhibits distension. There is no abdominal tenderness. There is no guarding.  Musculoskeletal:        General: Edema present.     Cervical back: Neck supple.     Comments: +2 pitting edema of bilateral  lower extremities  Neurological: She is alert and oriented to person, place, and time.  Skin: Skin is warm and dry. She is not diaphoretic.    Labs on Admission: I have personally reviewed following labs and imaging studies  CBC: Recent Labs  Lab 05/10/19 0144  WBC 7.9  NEUTROABS 4.5  HGB 6.7*  HCT 22.5*  MCV 82.7  PLT 294   Basic Metabolic Panel: Recent Labs  Lab 05/10/19 0144  NA 137  K 3.6  CL 110  CO2 21*  GLUCOSE 264*  BUN 12  CREATININE 1.08*  CALCIUM 7.7*  MG 1.9   GFR: Estimated Creatinine Clearance: 93.7 mL/min (A) (by C-G formula based on SCr of 1.08 mg/dL (H)). Liver Function Tests: Recent Labs  Lab 05/10/19 0144  AST 41  ALT 28  ALKPHOS 141*  BILITOT 0.8  PROT 8.2*  ALBUMIN 2.5*   No results for input(s): LIPASE, AMYLASE in the last 168 hours. No results for input(s): AMMONIA in the last 168 hours. Coagulation Profile: No results for input(s): INR, PROTIME in the last 168 hours. Cardiac Enzymes: No results for input(s): CKTOTAL, CKMB, CKMBINDEX,  TROPONINI in the last 168 hours. BNP (last 3 results) No results for input(s): PROBNP in the last 8760 hours. HbA1C: No results for input(s): HGBA1C in the last 72 hours. CBG: No results for input(s): GLUCAP in the last 168 hours. Lipid Profile: No results for input(s): CHOL, HDL, LDLCALC, TRIG, CHOLHDL, LDLDIRECT in the last 72 hours. Thyroid Function Tests: No results for input(s): TSH, T4TOTAL, FREET4, T3FREE, THYROIDAB in the last 72 hours. Anemia Panel: Recent Labs    05/10/19 0402  FOLATE 6.9   Urine analysis:    Component Value Date/Time   COLORURINE AMBER (A) 09/10/2017 2045   APPEARANCEUR CLEAR 09/10/2017 2045   LABSPEC 1.015 03/31/2018 1419   PHURINE 6.5 03/31/2018 1419   GLUCOSEU NEGATIVE 03/31/2018 1419   HGBUR TRACE (A) 03/31/2018 1419   BILIRUBINUR NEGATIVE 03/31/2018 1419   BILIRUBINUR small 07/21/2017 1601   KETONESUR NEGATIVE 03/31/2018 1419   PROTEINUR 30 (A) 03/31/2018 1419   UROBILINOGEN 0.2 03/31/2018 1419   NITRITE NEGATIVE 03/31/2018 1419   LEUKOCYTESUR MODERATE (A) 03/31/2018 1419    Radiological Exams on Admission: DG Chest 2 View  Result Date: 05/10/2019 CLINICAL DATA:  49 year old female with shortness of breath. EXAM: CHEST - 2 VIEW COMPARISON:  Chest radiograph dated 01/11/2019. FINDINGS: Top-normal cardiac size. There is mild vascular congestion. Pneumonia is not excluded. Clinical correlation is recommended. No lobar consolidation, large pleural effusion, pneumothorax. No acute osseous pathology. IMPRESSION: Mild vascular congestion. Pneumonia is not excluded. Electronically Signed   By: Anner Crete M.D.   On: 05/10/2019 00:53    EKG: Independently reviewed.  Sinus rhythm, QTC 529.  No acute ischemic changes.  QT interval increased since prior tracing.  Assessment/Plan Principal Problem:   CHF (congestive heart failure) (HCC) Active Problems:   Anemia   Hyperglycemia   Type 2 diabetes mellitus (HCC)   Elevated troponin   Acute  CHF: Patient is presenting with complaints of progressively worsening dyspnea even on minimal exertion, paroxysmal nocturnal dyspnea, and abdominal distention.  Does have peripheral edema on exam.  BNP likely falsely low in the setting of morbid obesity. Chest x-ray personally showing cardiomegaly and mild vascular congestion. Echo done in August 2019 with normal LVEF of 65 to 70% and grade 1 diastolic dysfunction.  Currently tachypneic with respiratory rate in the 30s but satting 100% on room air. -Continue IV Lasix  40 mg twice daily.  Monitor intake and output, daily weights, and low-sodium diet with fluid restriction.  Repeat echocardiogram.  Normocytic anemia: Hemoglobin 6.7, previously ranging between 10.8-11.9 in 2019.  No recent labs for comparison.  MCV 82.7.  Rectal exam with light-colored stool and Hemoccult negative.  Total protein borderline elevated 8.2.  No hypercalcemia, renal failure, or complaints of constitutional symptoms/bone pain to suggest multiple myeloma. -2 units PRBCs ordered in the ED.  Will give 1 unit at this time and have advised nursing staff to hold the second unit given volume overload.  Repeat H&H after 1 unit PRBCs and give additional units if hemoglobin continues to be less than 7.  Anemia panel pending. -Total protein borderline elevated 8.2.  No hypercalcemia, renal failure, or complaints of constitutional symptoms/bone pain to suggest multiple myeloma.  Consider consulting hematology in a.m.  Hyperglycemia in the setting of insulin-dependent type 2 diabetes: Blood glucose 264.  Does not appear to be in DKA.  Bicarb borderline low at 21, anion gap 6.  Per pharmacy med rec, patient has not taken her home Lantus since sometime last week.  Plan is to check A1c.  Resume Lantus in order sliding scale insulin with meals.  Mild troponin elevation: Suspect related to demand ischemia in setting of acute CHF.  Initial high-sensitivity troponin 22.  EKG without acute ischemic  changes.  Does endorse anginal symptoms but per patient most recent episode of chest pain was 2 days ago.  No chest pain at present.  Plan is to continue to trend troponin and continue cardiac monitoring.  Echocardiogram as above.  Consult cardiology in a.m. for recommendations regarding further ischemic evaluation given acute CHF.  QT prolongation on EKG: Continue cardiac monitoring.  Keep potassium above 4 and magnesium above 2.  Avoid QT prolonging drugs.  Repeat EKG in a.m.  Hypothyroidism: Per pharmacy med rec, patient took Synthroid past month at unknown time.  Resume Synthroid and check TSH level.  DVT prophylaxis: Lovenox Code Status: Full code Family Communication: No family available at this time. Disposition Plan: Status is: Inpatient  Remains inpatient appropriate because:IV treatments appropriate due to intensity of illness or inability to take PO   Dispo: The patient is from: Home              Anticipated d/c is to: Home              Anticipated d/c date is: 3 days              Patient currently is not medically stable to d/c.  The medical decision making on this patient was of high complexity and the patient is at high risk for clinical deterioration, therefore this is a level 3 visit.  Shela Leff MD [facility]  If 7PM-7AM, please contact night-coverage [email or web address]  05/10/2019, 5:40 AM

## 2019-05-10 NOTE — ED Triage Notes (Addendum)
Pt says that she has been having SOB for about a week, says she has had increased SOB with exertion, could not even make it to her bathroom tonight. Also says that she has had swelling in her stomach. Pt with obvious dyspnea on exertion with walking to triage.

## 2019-05-10 NOTE — ED Notes (Signed)
Pt ambulated to the restroom with no assistance.

## 2019-05-10 NOTE — Progress Notes (Signed)
Patient seen and examined at bedside, patient admitted after midnight, please see earlier detailed admission note by John Giovanni, MD. Briefly, patient presented secondary to worsening dyspnea especially on exertion in addition to PND concerning for heart failure. Lasix IV diuresis initiated.  Physical exam:  General exam: Appears calm and comfortable Respiratory system: Diminished with mild wheezing. Respiratory effort normal. Cardiovascular system: S1 & S2 heard, RRR. 2/6 systolic murmur Gastrointestinal system: Abdomen is obese, distended, soft and nontender. No organomegaly or masses felt. Normal bowel sounds heard. Central nervous system: Alert and oriented. No focal neurological deficits. Extremities: 1+ BLE edema. No calf tenderness Skin: No cyanosis. No rashes Psychiatry: Judgement and insight appear normal. Mood & affect appropriate.   Assessment/Plan:  Dyspnea Acute respiratory failure with hypoxia Presumed heart failure. No urine output documented but patient has been heavily diuresing. Still requiring oxygen. Feels improved from admission but not at baseline. Transthoracic Echocardiogram is pending. -Continue diuresis with Lasix 40 mg BID and follow-up Transthoracic Echocardiogram results -Daily BMP -Daily weights and strict in/out  Iron deficiency anemia Patient has heavy menstrual cycles which is the likely cause. Significant iron deficiency noted on iron panel. Baseline hemoglobin of around 11 from just under two years ago. Hemoglobin on admission of 6.7 with repeat of 6.6. Patient ordered for 2 units of PRBC but appears only 1 unit was transfused -Post transfusion H&H -Would likely benefit from IV iron transfusion prior to discharge -Daily CBC  Hypothyroidism Patient is on Synthroid 50 mcg daily. TSH on admission of 9.846. Patient has not been taking her Synthroid as directed for years -Continue Synthroid 50 mcg daily  Rest per H&P  Jacquelin Hawking, MD [facility] 05/10/2019, 11:53 AM

## 2019-05-10 NOTE — ED Notes (Signed)
Pt put on purwick and pt put back on monitor.

## 2019-05-10 NOTE — ED Notes (Signed)
Date and time results received: 05/10/19 3:43 AM  (use smartphrase ".now" to insert current time)  Test: Hemoglobin Critical Value: 6.7  Name of Provider Notified: Dr.Gick  Orders Received? Or Actions Taken?:

## 2019-05-10 NOTE — ED Notes (Signed)
Attempted to call report to [phone], will call back when she is available.

## 2019-05-10 NOTE — Progress Notes (Signed)
Inpatient Diabetes Program Recommendations  AACE/ADA: New Consensus Statement on Inpatient Glycemic Control (2015)  Target Ranges:  Prepandial:   less than 140 mg/dL      Peak postprandial:   less than 180 mg/dL (1-2 hours)      Critically ill patients:  140 - 180 mg/dL   Lab Results  Component Value Date   GLUCAP 75 05/10/2019   HGBA1C 9.3 (H) 05/10/2019    Review of Glycemic Control Results for Sheila Gibson, Sheila Gibson (MRN [id number]) as of 05/10/2019 12:47  Ref. Range 05/10/2019 07:41 05/10/2019 11:44  Glucose-Capillary Latest Ref Range: 70 - 99 mg/dL 446 (H) 75   Diabetes history: DM 2 Outpatient Diabetes medications:  Lantus 60 units bid Current orders for Inpatient glycemic control:  Novolog 0-9 units tid with meals Lantus 60 units bid  Inpatient Diabetes Program Recommendations:   CBG=75 mg/dL. Please consider reducing Lantus to 30 units bid starting 05/11/19.   Thanks,  Beryl Meager, RN, BC-ADM Inpatient Diabetes Coordinator Pager [phone] (8a-5p)

## 2019-05-10 NOTE — Progress Notes (Signed)
  Echocardiogram 2D Echocardiogram has been performed.  [name] 05/10/2019, 12:00 PM

## 2019-05-10 NOTE — ED Provider Notes (Signed)
[facility] DEPT Provider Note   CSN: [id number] Arrival date & time: 05/10/19  0012    History Chief Complaint  Patient presents with  . Shortness of Breath    [address] Sheila Gibson is a 49 y.o. female.  The history is provided by the patient.  Shortness of Breath She has history of hypertension, diabetes, COPD, cirrhosis of the liver and comes in with progressive dyspnea over the last 2 weeks.  At baseline, she can climb 2 flights of steps to get to her apartment.  Now, she has dyspnea just walking between rooms in her apartment.  She has noted some increased abdominal girth along with this.  She denies chest pain, heaviness, tightness, pressure.  She denies cough.  She denies fever chills.  She denies exposure to COVID-19.  She has not been vaccinated against COVID-19.  There has been no change in sense of smell or taste.  She has not noted leg swelling but states that her legs hurt.  Past Medical History:  Diagnosis Date  . COPD (chronic obstructive pulmonary disease) ([place])   . Depression   . Diabetes mellitus without complication ([place])   . Heart murmur   . Hepatic cirrhosis ([place])   . Hypertension   . Obesity   . Thyroid disease     Patient Active Problem List   Diagnosis Date Noted  . Warts, genital 08/20/2017  . Hypertensive urgency 08/17/2017    Past Surgical History:  Procedure Laterality Date  . CESAREAN SECTION    . ESOPHAGOGASTRODUODENOSCOPY (EGD) WITH PROPOFOL N/A 09/01/2017   Procedure: ESOPHAGOGASTRODUODENOSCOPY (EGD) WITH PROPOFOL;  Surgeon: Otis Brace, MD;  Location: [facility];  Service: Gastroenterology;  Laterality: N/A;  . THYROIDECTOMY       OB History    Gravida  6   Para  5   Term  5   Preterm      AB  1   Living  4     SAB  1   TAB      Ectopic      Multiple      Live Births  4           Family History  Problem Relation Age of Onset  . Cancer Mother   . Hypertension Other      Social History   Tobacco Use  . Smoking status: Never Smoker  . Smokeless tobacco: Never Used  Substance Use Topics  . Alcohol use: Not Currently  . Drug use: No    Home Medications Prior to Admission medications   Medication Sig Start Date End Date Taking? Authorizing Provider  acetaminophen (TYLENOL) 500 MG tablet Take 500 mg by mouth daily as needed for moderate pain.    [provider]  albuterol (PROVENTIL HFA;VENTOLIN HFA) 108 (90 BASE) MCG/ACT inhaler Inhale 1-2 puffs into the lungs every 6 (six) hours as needed for wheezing or shortness of breath.    [provider]  clotrimazole (LOTRIMIN) 1 % cream Apply to affected area 2 times daily 03/31/18   Wieters, Hallie C, PA-C  famotidine (PEPCID) 20 MG tablet Take 1 tablet (20 mg total) by mouth at bedtime. 08/20/18   Zigmund Gottron, NP  gabapentin (NEURONTIN) 800 MG tablet Take 800 mg by mouth 3 (three) times daily.     [provider]  hydrOXYzine (ATARAX/VISTARIL) 25 MG tablet Take 25 mg by mouth 3 (three) times daily as needed. for anxiety 08/05/17   [provider]  ibuprofen (  ADVIL,MOTRIN) 600 MG tablet Take 1 tablet (600 mg total) by mouth every 6 (six) hours as needed. 01/12/18   Wieters, Hallie C, PA-C  insulin aspart protamine- aspart (NOVOLOG MIX 70/30) (70-30) 100 UNIT/ML injection Inject 6 Units into the skin 2 (two) times daily with a meal.    [provider]  insulin glargine (LANTUS) 100 unit/mL SOPN Inject 60 Units into the skin 2 (two) times daily.     [provider]  levothyroxine (SYNTHROID, LEVOTHROID) 50 MCG tablet Take 50 mcg by mouth daily.  07/20/17   [provider]  losartan (COZAAR) 100 MG tablet Take 1 tablet (100 mg total) by mouth daily. 01/18/18   Jaynee Eagles, PA-C  omeprazole (PRILOSEC) 40 MG capsule Take 1 capsule (40 mg total) by mouth daily. 08/20/18   Zigmund Gottron, NP  ondansetron (ZOFRAN ODT) 4 MG disintegrating tablet 64m ODT q4  hours prn nausea/vomit 08/27/17   ZMilton Ferguson MD  ondansetron (ZOFRAN) 4 MG tablet Take 4 mg by mouth daily as needed for nausea or vomiting.    [provider]  prochlorperazine (COMPAZINE) 10 MG tablet Take 1 tablet (10 mg total) by mouth 2 (two) times daily as needed for nausea or vomiting. 12/07/18   Tegeler, CGwenyth Allegra MD  promethazine-dextromethorphan (PROMETHAZINE-DM) 6.25-15 MG/5ML syrup Take 5 mLs by mouth 3 (three) times daily as needed for cough. 01/18/18   MJaynee Eagles PA-C  tenofovir (VIREAD) 300 MG tablet Take 300 mg by mouth daily. 08/03/17   [provider]  tiZANidine (ZANAFLEX) 4 MG tablet Take 4 mg by mouth 3 (three) times daily as needed for muscle spasms.     [provider]  tobramycin (TOBREX) 0.3 % ophthalmic solution Place 1 drop into the right eye every 6 (six) hours. 09/06/18   HVanessa Kick MD  triamcinolone cream (KENALOG) 0.1 % Apply 1 application topically 3 (three) times daily. 08/05/17   [provider]  venlafaxine XR (EFFEXOR-XR) 37.5 MG 24 hr capsule Take 37.5 mg by mouth daily. 07/06/17   [provider]  zolpidem (AMBIEN) 10 MG tablet Take 10 mg by mouth at bedtime as needed for sleep.  01/23/17   [provider]  ZOVIRAX 5 % Apply 1 application topically 3 (three) times daily.  04/14/17   [provider]    Allergies    Patient has no known allergies.  Review of Systems   Review of Systems  Respiratory: Positive for shortness of breath.   All other systems reviewed and are negative.   Physical Exam Updated Vital Signs BP (!) 174/100 (BP Location: Right Arm)   Pulse (!) 106   Temp 98.2 F (36.8 C) (Oral)   Resp (!) 26   Ht 5' 7"  (1.702 m)   Wt (!) 140.6 kg   SpO2 92%   BMI 48.55 kg/m   Physical Exam Vitals and nursing note reviewed.   Morbidly obese 49year old female, dyspneic at rest but not using accessory muscles of respiration. Vital signs are significant for elevated heart  rate, respiratory rate, blood pressure. Oxygen saturation is 92%, which is normal. Head is normocephalic and atraumatic. PERRLA, EOMI. Oropharynx is clear. Neck is nontender and supple without adenopathy or JVD. Back is nontender and there is no CVA tenderness. Lungs have rales at the left base without wheezes or rhonchi. Chest is nontender. Heart has regular rate and rhythm without murmur. Abdomen is soft, nontender without masses or hepatosplenomegaly and peristalsis is normoactive.  No fluid  wave detected. Extremities have 2-3+ edema, full range of motion is present. Skin is warm and dry without rash. Neurologic: Mental status is normal, cranial nerves are intact, there are no motor or sensory deficits.  ED Results / Procedures / Treatments   Labs (all labs ordered are listed, but only abnormal results are displayed) Labs Reviewed - No data to display  EKG EKG Interpretation  Date/Time:  Tuesday May 10 2019 00:32:21 EDT Ventricular Rate:  99 PR Interval:    QRS Duration: 108 QT Interval:  412 QTC Calculation: 529 R Axis:   92 Text Interpretation: Sinus rhythm Borderline Right axis deviation Prolonged QT When compared with ECG of 10/27/2016, QT has lengthened Reconfirmed by Delora Fuel ([id number]) on 05/10/2019 12:39:22 AM   Radiology DG Chest 2 View  Result Date: 05/10/2019 CLINICAL DATA:  49 year old female with shortness of breath. EXAM: CHEST - 2 VIEW COMPARISON:  Chest radiograph dated 01/11/2019. FINDINGS: Top-normal cardiac size. There is mild vascular congestion. Pneumonia is not excluded. Clinical correlation is recommended. No lobar consolidation, large pleural effusion, pneumothorax. No acute osseous pathology. IMPRESSION: Mild vascular congestion. Pneumonia is not excluded. Electronically Signed   By: Anner Crete M.D.   On: 05/10/2019 00:53    Procedures Procedures  CRITICAL CARE Performed by: Delora Fuel Total critical care time: 55 minutes Critical care time  was exclusive of separately billable procedures and treating other patients. Critical care was necessary to treat or prevent imminent or life-threatening deterioration. Critical care was time spent personally by me on the following activities: development of treatment plan with patient and/or surrogate as well as nursing, discussions with consultants, evaluation of patient's response to treatment, examination of patient, obtaining history from patient or surrogate, ordering and performing treatments and interventions, ordering and review of laboratory studies, ordering and review of radiographic studies, pulse oximetry and re-evaluation of patient's condition.  Medications Ordered in ED Medications  0.9 %  sodium chloride infusion (has no administration in time range)  nitroGLYCERIN (NITROGLYN) 2 % ointment 1 inch (1 inch Topical Given 05/10/19 0154)  furosemide (LASIX) injection 40 mg (40 mg Intravenous Given 05/10/19 0148)    ED Course  I have reviewed the triage vital signs and the nursing notes.  Pertinent labs & imaging results that were available during my care of the patient were reviewed by me and considered in my medical decision making (see chart for details).  MDM Rules/Calculators/A&P Dyspnea which appears to be heart failure.  Prominent leg edema is consistent with heart failure.  No risk factors for pulmonary embolism, no fever or cough to suggest pneumonia, no wheezing to suspect COPD exacerbation.  Chest x-ray shows significant increase in cardiac size compared with prior, and mild vascular congestion consistent with heart failure.  ECG is significant for mild prolongation of QT interval.  Will check screening labs and give furosemide and start on topical nitrates.  She has had good diuresis with furosemide.  Labs are significant for hemoglobin of 6.7 with normocytic but slightly hypochromic RBC indices.  Rectal exam was done showing light-colored stool which was Hemoccult negative.   Her heart failure may be high output secondary to her anemia, so she is given blood transfusion.  Chemistries do show elevated alkaline phosphatase which is less than it was previously.  She is noted to have low albumin and high total protein suggesting possible multiple myeloma.  Troponin is mildly elevated and felt to represent demand ischemia and not ACS.  Case is discussed with Dr. Marlowe Sax of  [facility], who agrees to admit the patient.  Final Clinical Impression(s) / ED Diagnoses Final diagnoses:  Acute heart failure, unspecified heart failure type (HCC)  Normocytic hypochromic anemia  Elevated troponin  Elevated alkaline phosphatase level  Morbid obesity [facility])    Rx / DC Orders ED Discharge Orders    None       Delora Fuel, MD 01/14/30 [phone]

## 2019-05-10 NOTE — Plan of Care (Signed)
Patient hand guide given. Call bell and personal belongings are within reach. Pt will call for assistance when needing to get out of bed.

## 2019-05-11 DIAGNOSIS — R739 Hyperglycemia, unspecified: Secondary | ICD-10-CM

## 2019-05-11 DIAGNOSIS — Z794 Long term (current) use of insulin: Secondary | ICD-10-CM

## 2019-05-11 DIAGNOSIS — E1159 Type 2 diabetes mellitus with other circulatory complications: Secondary | ICD-10-CM

## 2019-05-11 DIAGNOSIS — R778 Other specified abnormalities of plasma proteins: Secondary | ICD-10-CM

## 2019-05-11 LAB — BASIC METABOLIC PANEL
Anion gap: 4 — ABNORMAL LOW (ref 5–15)
BUN: 18 mg/dL (ref 6–20)
CO2: 26 mmol/L (ref 22–32)
Calcium: 7.7 mg/dL — ABNORMAL LOW (ref 8.9–10.3)
Chloride: 108 mmol/L (ref 98–111)
Creatinine, Ser: 1.36 mg/dL — ABNORMAL HIGH (ref 0.44–1.00)
GFR calc Af Amer: 53 mL/min — ABNORMAL LOW (ref >60)
GFR calc non Af Amer: 46 mL/min — ABNORMAL LOW (ref >60)
Glucose, Bld: 86 mg/dL (ref 70–99)
Potassium: 3.8 mmol/L (ref 3.5–5.1)
Sodium: 138 mmol/L (ref 135–145)

## 2019-05-11 LAB — CBC
HCT: 23.5 % — ABNORMAL LOW (ref 36.0–46.0)
Hemoglobin: 7 g/dL — ABNORMAL LOW (ref 12.0–15.0)
MCH: 24.6 pg — ABNORMAL LOW (ref 26.0–34.0)
MCHC: 29.8 g/dL — ABNORMAL LOW (ref 30.0–36.0)
MCV: 82.7 fL (ref 80.0–100.0)
Platelets: 146 10*3/uL — ABNORMAL LOW (ref 150–400)
RBC: 2.84 MIL/uL — ABNORMAL LOW (ref 3.87–5.11)
RDW: 16.7 % — ABNORMAL HIGH (ref 11.5–15.5)
WBC: 8.1 10*3/uL (ref 4.0–10.5)
nRBC: 0 % (ref 0.0–0.2)

## 2019-05-11 LAB — PREPARE RBC (CROSSMATCH)

## 2019-05-11 LAB — T4, FREE: Free T4: 0.57 ng/dL — ABNORMAL LOW (ref 0.61–1.12)

## 2019-05-11 LAB — HEMOGLOBIN AND HEMATOCRIT, BLOOD
HCT: 27.1 % — ABNORMAL LOW (ref 36.0–46.0)
Hemoglobin: 8.3 g/dL — ABNORMAL LOW (ref 12.0–15.0)

## 2019-05-11 LAB — GLUCOSE, CAPILLARY
Glucose-Capillary: 52 mg/dL — ABNORMAL LOW (ref 70–99)
Glucose-Capillary: 56 mg/dL — ABNORMAL LOW (ref 70–99)
Glucose-Capillary: 89 mg/dL (ref 70–99)
Glucose-Capillary: 142 mg/dL — ABNORMAL HIGH (ref 70–99)
Glucose-Capillary: 116 mg/dL — ABNORMAL HIGH (ref 70–99)
Glucose-Capillary: 82 mg/dL (ref 70–99)
Glucose-Capillary: 94 mg/dL (ref 70–99)

## 2019-05-11 LAB — VITAMIN D 25 HYDROXY (VIT D DEFICIENCY, FRACTURES): Vit D, 25-Hydroxy: 7.39 ng/mL — ABNORMAL LOW (ref 30–100)

## 2019-05-11 MED ORDER — SODIUM CHLORIDE 0.9 % IV SOLN: INTRAVENOUS | Status: DC | PRN

## 2019-05-11 MED ORDER — SODIUM CHLORIDE 0.9% IV SOLUTION: Freq: Once | INTRAVENOUS | Status: AC

## 2019-05-11 MED ORDER — FERROUS SULFATE 325 (65 FE) MG PO TABS
325.0000 mg | ORAL_TABLET | Freq: Two times a day (BID) | ORAL | Status: DC
  Administered 2019-05-12: 325 mg via ORAL
  Filled 2019-05-11: qty 1

## 2019-05-11 MED ORDER — SENNOSIDES-DOCUSATE SODIUM 8.6-50 MG PO TABS: 2.0000 | ORAL_TABLET | Freq: Every evening | ORAL | Status: DC | PRN

## 2019-05-11 MED ORDER — SODIUM CHLORIDE 0.9 % IV SOLN
510.0000 mg | Freq: Once | INTRAVENOUS | Status: AC
  Administered 2019-05-11: 510 mg via INTRAVENOUS
  Filled 2019-05-11: qty 510

## 2019-05-11 MED ORDER — VITAMIN D (ERGOCALCIFEROL) 1.25 MG (50000 UNIT) PO CAPS
50000.0000 [IU] | ORAL_CAPSULE | ORAL | Status: DC
  Administered 2019-05-11: 50000 [IU] via ORAL
  Filled 2019-05-11: qty 1

## 2019-05-11 MED ORDER — LEVOTHYROXINE SODIUM 100 MCG PO TABS
100.0000 ug | ORAL_TABLET | Freq: Every day | ORAL | Status: DC
  Administered 2019-05-12: 100 ug via ORAL
  Filled 2019-05-11: qty 1

## 2019-05-11 MED ORDER — POLYETHYLENE GLYCOL 3350 17 G PO PACK: 17.0000 g | PACK | Freq: Every day | ORAL | Status: DC | PRN

## 2019-05-11 MED ORDER — INSULIN GLARGINE 100 UNIT/ML ~~LOC~~ SOLN
30.0000 [IU] | Freq: Every day | SUBCUTANEOUS | Status: DC
  Administered 2019-05-11 – 2019-05-12 (×2): 30 [IU] via SUBCUTANEOUS
  Filled 2019-05-11 (×2): qty 0.3

## 2019-05-11 NOTE — Progress Notes (Signed)
Hypoglycemic Event  CBG: 56  Treatment: 8 oz juice/soda  Symptoms: None  Follow-up CBG: Time:   0812  CBG Result:89   Possible Reasons for Event: Unknown  Sheila Gibson

## 2019-05-11 NOTE — Progress Notes (Signed)
abg attempted x2 pt refusing to be stuck anymore. MD notified

## 2019-05-11 NOTE — Progress Notes (Signed)
PROGRESS NOTE    Sheila Gibson  WCB:[id number] DOB: 12/29/1970 DOA: 05/10/2019 PCP: Lorayne Bender, FNP   Brief Narrative:  49 year old with a history of COPD, DM 2, HTN, hepatic cirrhosis, chronic hep B on tenofovir, hypothyroidism, morbid obesity with BMI greater than 45, depression presented with shortness of breath.  Upon admission she was found to be in CHF exacerbation complicated by severe iron deficiency anemia.  She likely also has undiagnosed obstructive sleep apnea.   Assessment & Plan:   Principal Problem:   CHF (congestive heart failure) (HCC) Active Problems:   Anemia   Hyperglycemia   Type 2 diabetes mellitus (HCC)   Elevated troponin  Acute hypoxic respiratory failure secondary to congestive heart failure with preserved ejection fraction, EF 65% with grade 2 diastolic dysfunction. -Continue Lasix 40 mg IV twice daily.  Monitor urine input and output -Monitor her weights daily.  Fluid restriction. -Echocardiogram shows grade 2 diastolic dysfunction.  Normocytic iron deficiency anemia -Hemoccult negative, suspect from heavy menstruation over many years. -IV iron today, p.o. iron starting tomorrow.  Aggressive bowel regimen -Give 1U PRBC  Morbid obesity with BMI greater than 45 -Suspect some sort of obesity hypoventilation syndrome/sleep apnea.  She will really need outpatient sleep study eventually.  Hypocalcemia  Vit d Deficiency -Unclear etiology.  Will check ionized calcium -Start weekly vitamin D.  Diabetes mellitus type 2, uncontrolled secondary to hyperglycemia -Continue insulin sliding scale and Accu-Chek. -Hemoglobin A1c- 9.3 -Supportive care.  Hypothyroidism -Increase Synthroid 100 mcg daily. -Elevated TSH, low free T4.    DVT prophylaxis: Lovenox  Code Status: Full  Family Communication:  None  Status is: Inpatient  Remains inpatient appropriate because:Hemodynamically unstable   Dispo: The patient is from: Home               Anticipated d/c is to: Home              Anticipated d/c date is: 2 days              Patient currently dyspneic on exertion requiring IV diuretics.   Subjective: Drowsy during my evaluation but easily arousable.  Tells me she has a sleep study in the past but does not remember the results this was many years ago.  She also admits of having heavy menstruation and periodically about every month. Still has exertional dyspnea.  Review of Systems Otherwise negative except as per HPI, including: General: Denies fever, chills, night sweats or unintended weight loss. Resp: Denies cough, wheezing, shortness of breath. Cardiac: Denies chest pain, palpitations, orthopnea, paroxysmal nocturnal dyspnea. GI: Denies abdominal pain, nausea, vomiting, diarrhea or constipation GU: Denies dysuria, frequency, hesitancy or incontinence MS: Denies muscle aches, joint pain or swelling Neuro: Denies headache, neurologic deficits (focal weakness, numbness, tingling), abnormal gait Psych: Denies anxiety, depression, SI/HI/AVH Skin: Denies new rashes or lesions ID: Denies sick contacts, exotic exposures, travel  Examination:  General exam: Drowsy but easily arousable. Respiratory system: Bilateral diminished breath sounds Cardiovascular system: S1 & S2 heard, RRR. No JVD, murmurs, rubs, gallops or clicks.  Trace lower extremity edema bilaterally. Gastrointestinal system: Abdomen is nondistended, soft and nontender. No organomegaly or masses felt. Normal bowel sounds heard. Central nervous system: Alert and oriented. No focal neurological deficits. Extremities: Symmetric 5 x 5 power. Skin: No rashes, lesions or ulcers Psychiatry: Judgement and insight appear normal. Mood & affect appropriate.     Objective: Vitals:   05/10/19 2138 05/11/19 0207 05/11/19 0625 05/11/19 1208  BP: (!) 149/66 104/66  110/76 (!) 109/45  Pulse: 82 80 74 80  Resp: (!) 22 (!) 22 (!) 22 (!) 25  Temp: 98.4 F (36.9 C) 98.2 F  (36.8 C) 98 F (36.7 C) 98.5 F (36.9 C)  TempSrc: Oral Oral Oral Oral  SpO2: 99% 97% 97% 98%  Weight:      Height:        Intake/Output Summary (Last 24 hours) at 05/11/2019 1229 Last data filed at 05/11/2019 1000 Gross per 24 hour  Intake 870.5 ml  Output 2200 ml  Net -1329.5 ml   Filed Weights   05/10/19 0020  Weight: (!) 140.6 kg     Data Reviewed:   CBC: Recent Labs  Lab 05/10/19 0144 05/10/19 0549 05/10/19 1244 05/11/19 0454  WBC 7.9  --  8.1 8.1  NEUTROABS 4.5  --   --   --   HGB 6.7* 6.6* 7.6* 7.0*  HCT 22.5* 22.4* 25.1* 23.5*  MCV 82.7  --  82.6 82.7  PLT 161  --  179 146*   Basic Metabolic Panel: Recent Labs  Lab 05/10/19 0144 05/11/19 0454  NA 137 138  K 3.6 3.8  CL 110 108  CO2 21* 26  GLUCOSE 264* 86  BUN 12 18  CREATININE 1.08* 1.36*  CALCIUM 7.7* 7.7*  MG 1.9  --    GFR: Estimated Creatinine Clearance: 74.4 mL/min (A) (by C-G formula based on SCr of 1.36 mg/dL (H)). Liver Function Tests: Recent Labs  Lab 05/10/19 0144  AST 41  ALT 28  ALKPHOS 141*  BILITOT 0.8  PROT 8.2*  ALBUMIN 2.5*   No results for input(s): LIPASE, AMYLASE in the last 168 hours. No results for input(s): AMMONIA in the last 168 hours. Coagulation Profile: No results for input(s): INR, PROTIME in the last 168 hours. Cardiac Enzymes: No results for input(s): CKTOTAL, CKMB, CKMBINDEX, TROPONINI in the last 168 hours. BNP (last 3 results) No results for input(s): PROBNP in the last 8760 hours. HbA1C: Recent Labs    05/10/19 0549  HGBA1C 9.3*   CBG: Recent Labs  Lab 05/11/19 0744 05/11/19 0748 05/11/19 0812 05/11/19 1020 05/11/19 1209  GLUCAP 52* 56* 89 142* 116*   Lipid Profile: No results for input(s): CHOL, HDL, LDLCALC, TRIG, CHOLHDL, LDLDIRECT in the last 72 hours. Thyroid Function Tests: Recent Labs    05/10/19 0549 05/11/19 0454  TSH 9.846*  --   FREET4  --  0.57*   Anemia Panel: Recent Labs    05/10/19 0402 05/10/19 0549    VITAMINB12  --  839  FOLATE 6.9  --   FERRITIN  --  9*  TIBC  --  382  IRON  --  16*  RETICCTPCT  --  2.1   Sepsis Labs: No results for input(s): PROCALCITON, LATICACIDVEN in the last 168 hours.  Recent Results (from the past 240 hour(s))  Respiratory Panel by RT PCR (Flu A&B, Covid) - Nasopharyngeal Swab     Status: None   Collection Time: 05/10/19  1:44 AM   Specimen: Nasopharyngeal Swab  Result Value Ref Range Status   SARS Coronavirus 2 by RT PCR NEGATIVE NEGATIVE Final    Comment: (NOTE) SARS-CoV-2 target nucleic acids are NOT DETECTED. The SARS-CoV-2 RNA is generally detectable in upper respiratoy specimens during the acute phase of infection. The lowest concentration of SARS-CoV-2 viral copies this assay can detect is 131 copies/mL. A negative result does not preclude SARS-Cov-2 infection and should not be used as the sole basis for treatment  or other patient management decisions. A negative result may occur with  improper specimen collection/handling, submission of specimen other than nasopharyngeal swab, presence of viral mutation(s) within the areas targeted by this assay, and inadequate number of viral copies (<131 copies/mL). A negative result must be combined with clinical observations, patient history, and epidemiological information. The expected result is Negative. Fact Sheet for Patients:  https://www.moore.com/ Fact Sheet for Healthcare Providers:  https://www.young.biz/ This test is not yet ap proved or cleared by the [place] FDA and  has been authorized for detection and/or diagnosis of SARS-CoV-2 by FDA under an Emergency Use Authorization (EUA). This EUA will remain  in effect (meaning this test can be used) for the duration of the COVID-19 declaration under Section 564(b)(1) of the Act, 21 U.S.C. section 360bbb-3(b)(1), unless the authorization is terminated or revoked sooner.    Influenza A by PCR NEGATIVE  NEGATIVE Final   Influenza B by PCR NEGATIVE NEGATIVE Final    Comment: (NOTE) The Xpert Xpress SARS-CoV-2/FLU/RSV assay is intended as an aid in  the diagnosis of influenza from Nasopharyngeal swab specimens and  should not be used as a sole basis for treatment. Nasal washings and  aspirates are unacceptable for Xpert Xpress SARS-CoV-2/FLU/RSV  testing. Fact Sheet for Patients: https://www.moore.com/ Fact Sheet for Healthcare Providers: https://www.young.biz/ This test is not yet approved or cleared by the [place] FDA and  has been authorized for detection and/or diagnosis of SARS-CoV-2 by  FDA under an Emergency Use Authorization (EUA). This EUA will remain  in effect (meaning this test can be used) for the duration of the  Covid-19 declaration under Section 564(b)(1) of the Act, 21  U.S.C. section 360bbb-3(b)(1), unless the authorization is  terminated or revoked. Performed at [facility], [address], [place], [place] [address]          Radiology Studies: DG Chest 2 View  Result Date: 05/10/2019 CLINICAL DATA:  49 year old female with shortness of breath. EXAM: CHEST - 2 VIEW COMPARISON:  Chest radiograph dated 01/11/2019. FINDINGS: Top-normal cardiac size. There is mild vascular congestion. Pneumonia is not excluded. Clinical correlation is recommended. No lobar consolidation, large pleural effusion, pneumothorax. No acute osseous pathology. IMPRESSION: Mild vascular congestion. Pneumonia is not excluded. Electronically Signed   By: Elgie Collard M.D.   On: 05/10/2019 00:53   ECHOCARDIOGRAM COMPLETE  Result Date: 05/10/2019    ECHOCARDIOGRAM REPORT   Patient Name:   Jerilynn Mages Date of Exam: 05/10/2019 Medical Rec #:  [id number]              Height:       67.0 in Accession #:    [id number]             Weight:       310.0 lb Date of Birth:  04/25/1970              BSA:          2.436 m Patient Age:     48 years               BP:           147/91 mmHg Patient Gender: F                      HR:           84 bpm. Exam Location:  Inpatient Procedure: 2D Echo, Cardiac Doppler and Color Doppler Indications:    CHF-Acute Diastolic  428.31 / I50.31  History:        Patient has prior history of Echocardiogram examinations, most                 recent 08/18/2017. CHF; Risk Factors:Diabetes and Non-Smoker.                 Elevated troponin.  Sonographer:    Renella Cunas RDCS Referring Phys: [id number] VASUNDHRA RATHORE IMPRESSIONS  1. Left ventricular ejection fraction, by estimation, is 60 to 65%. The left ventricle has normal function. The left ventricle has no regional wall motion abnormalities. Left ventricular diastolic parameters are consistent with Grade II diastolic dysfunction (pseudonormalization). Elevated left ventricular end-diastolic pressure.  2. Right ventricular systolic function is normal. The right ventricular size is normal. There is normal pulmonary artery systolic pressure.  3. The mitral valve is normal in structure. Trivial mitral valve regurgitation. No evidence of mitral stenosis.  4. The aortic valve is tricuspid. Aortic valve regurgitation is not visualized. No aortic stenosis is present.  5. The inferior vena cava is normal in size with greater than 50% respiratory variability, suggesting right atrial pressure of 3 mmHg. FINDINGS  Left Ventricle: Left ventricular ejection fraction, by estimation, is 60 to 65%. The left ventricle has normal function. The left ventricle has no regional wall motion abnormalities. The left ventricular internal cavity size was normal in size. There is  no left ventricular hypertrophy. Left ventricular diastolic parameters are consistent with Grade II diastolic dysfunction (pseudonormalization). Elevated left ventricular end-diastolic pressure. Right Ventricle: The right ventricular size is normal. No increase in right ventricular wall thickness. Right ventricular systolic  function is normal. There is normal pulmonary artery systolic pressure. The tricuspid regurgitant velocity is 2.62 m/s, and  with an assumed right atrial pressure of 3 mmHg, the estimated right ventricular systolic pressure is 30.5 mmHg. Left Atrium: Left atrial size was normal in size. Right Atrium: Right atrial size was normal in size. Pericardium: There is no evidence of pericardial effusion. Mitral Valve: The mitral valve is normal in structure. Mild mitral annular calcification. Trivial mitral valve regurgitation. No evidence of mitral valve stenosis. Tricuspid Valve: The tricuspid valve is normal in structure. Tricuspid valve regurgitation is trivial. No evidence of tricuspid stenosis. Aortic Valve: The aortic valve is tricuspid. Aortic valve regurgitation is not visualized. No aortic stenosis is present. There is mild calcification of the aortic valve. Aortic valve mean gradient measures 12.0 mmHg. Aortic valve peak gradient measures 25.0 mmHg. Aortic valve area, by VTI measures 1.93 cm. Pulmonic Valve: The pulmonic valve was not well visualized. Pulmonic valve regurgitation is not visualized. No evidence of pulmonic stenosis. Aorta: The aortic root, ascending aorta and aortic arch are all structurally normal, with no evidence of dilitation or obstruction. Venous: The inferior vena cava is normal in size with greater than 50% respiratory variability, suggesting right atrial pressure of 3 mmHg. IAS/Shunts: The atrial septum is grossly normal.  LEFT VENTRICLE PLAX 2D LVIDd:         4.90 cm      Diastology LVIDs:         3.50 cm      LV e' lateral:   7.40 cm/s LV PW:         0.80 cm      LV E/e' lateral: 18.4 LV IVS:        0.80 cm      LV e' medial:    6.20 cm/s LVOT diam:     1.90  cm      LV E/e' medial:  21.9 LV SV:         91 LV SV Index:   37 LVOT Area:     2.84 cm  LV Volumes (MOD) LV vol d, MOD A2C: 150.0 ml LV vol d, MOD A4C: 164.0 ml LV vol s, MOD A2C: 49.0 ml LV vol s, MOD A4C: 64.0 ml LV SV MOD A2C:      101.0 ml LV SV MOD A4C:     164.0 ml LV SV MOD BP:      100.9 ml RIGHT VENTRICLE RV S prime:     14.30 cm/s TAPSE (M-mode): 1.8 cm LEFT ATRIUM             Index       RIGHT ATRIUM           Index LA diam:        4.30 cm 1.76 cm/m  RA Area:     13.80 cm LA Vol (A2C):   54.1 ml 22.21 ml/m RA Volume:   34.80 ml  14.28 ml/m LA Vol (A4C):   59.0 ml 24.22 ml/m LA Biplane Vol: 62.3 ml 25.57 ml/m  AORTIC VALVE AV Area (Vmax):    1.78 cm AV Area (Vmean):   1.90 cm AV Area (VTI):     1.93 cm AV Vmax:           250.00 cm/s AV Vmean:          166.000 cm/s AV VTI:            0.471 m AV Peak Grad:      25.0 mmHg AV Mean Grad:      12.0 mmHg LVOT Vmax:         157.00 cm/s LVOT Vmean:        111.000 cm/s LVOT VTI:          0.321 m LVOT/AV VTI ratio: 0.68  AORTA Ao Root diam: 3.20 cm MITRAL VALVE                TRICUSPID VALVE MV Area (PHT): 3.34 cm     TR Peak grad:   27.5 mmHg MV Decel Time: 227 msec     TR Vmax:        262.00 cm/s MV E velocity: 136.00 cm/s MV A velocity: 129.00 cm/s  SHUNTS MV E/A ratio:  1.05         Systemic VTI:  0.32 m                             Systemic Diam: 1.90 cm Jodelle Red MD Electronically signed by Jodelle Red MD Signature Date/Time: 05/10/2019/4:28:19 PM    Final         Scheduled Meds: . enoxaparin (LOVENOX) injection  70 mg Subcutaneous Q24H  . [START ON 05/12/2019] ferrous sulfate  325 mg Oral BID WC  . furosemide  40 mg Intravenous Q12H  . gabapentin  800 mg Oral TID  . insulin aspart  0-9 Units Subcutaneous TID WC  . insulin glargine  30 Units Subcutaneous Daily  . levothyroxine  50 mcg Oral Q0600  . losartan  100 mg Oral Daily  . sodium chloride flush  3 mL Intravenous Q12H  . tenofovir  300 mg Oral Daily   Continuous Infusions: . sodium chloride 250 mL (05/11/19 1111)  . sodium chloride       LOS: 1 day  Time spent= 35 mins    Griffith Santilli Joline Maxcyhirag Jla Reynolds, MD [facility]  If 7PM-7AM, please contact night-coverage  05/11/2019, 12:29  PM

## 2019-05-11 NOTE — Progress Notes (Signed)
   05/11/19 1505  Assess: MEWS Score  Temp 98.7 F (37.1 C)  BP (!) 142/87  Pulse Rate 78  Resp (!) 22  Level of Consciousness Responds to Voice  SpO2 97 %  O2 Device Room Air  Patient Activity (if Appropriate) In bed  Assess: MEWS Score  MEWS Temp 0  MEWS Systolic 0  MEWS Pulse 0  MEWS RR 1  MEWS LOC 1  MEWS Score 2  MEWS Score Color Yellow  Assess: if the MEWS score is Yellow or Red  Were vital signs taken at a resting state? Yes  Focused Assessment Documented focused assessment  Early Detection of Sepsis Score *See Row Information* Low  MEWS guidelines implemented *See Row Information* Yes  Treat  MEWS Interventions Administered scheduled meds/treatments  Take Vital Signs  Increase Vital Sign Frequency  Yellow: Q 2hr X 2 then Q 4hr X 2, if remains yellow, continue Q 4hrs  Notify: Charge Nurse/RN  Name of Charge Nurse/RN Notified Dalbert Garnet, RN  Date Charge Nurse/RN Notified 05/11/19  Time Charge Nurse/RN Notified 1510   Pt receiving one unit of PRBC per order.  Will continue to monitor. Sheila Gibson, Sheila Flemings, RN

## 2019-05-12 LAB — BASIC METABOLIC PANEL
Anion gap: 8 (ref 5–15)
BUN: 19 mg/dL (ref 6–20)
CO2: 25 mmol/L (ref 22–32)
Calcium: 7.8 mg/dL — ABNORMAL LOW (ref 8.9–10.3)
Chloride: 105 mmol/L (ref 98–111)
Creatinine, Ser: 1.23 mg/dL — ABNORMAL HIGH (ref 0.44–1.00)
GFR calc Af Amer: >60 mL/min (ref >60)
GFR calc non Af Amer: 52 mL/min — ABNORMAL LOW (ref >60)
Glucose, Bld: 108 mg/dL — ABNORMAL HIGH (ref 70–99)
Potassium: 4.1 mmol/L (ref 3.5–5.1)
Sodium: 138 mmol/L (ref 135–145)

## 2019-05-12 LAB — CBC
HCT: 26.5 % — ABNORMAL LOW (ref 36.0–46.0)
Hemoglobin: 8.1 g/dL — ABNORMAL LOW (ref 12.0–15.0)
MCH: 25.1 pg — ABNORMAL LOW (ref 26.0–34.0)
MCHC: 30.6 g/dL (ref 30.0–36.0)
MCV: 82 fL (ref 80.0–100.0)
Platelets: 154 10*3/uL (ref 150–400)
RBC: 3.23 MIL/uL — ABNORMAL LOW (ref 3.87–5.11)
RDW: 16.4 % — ABNORMAL HIGH (ref 11.5–15.5)
WBC: 7.5 10*3/uL (ref 4.0–10.5)
nRBC: 0 % (ref 0.0–0.2)

## 2019-05-12 LAB — BPAM RBC
Blood Product Expiration Date: 202105272359
Blood Product Expiration Date: 202105272359
ISSUE DATE / TIME: 202105040557
ISSUE DATE / TIME: 202105051442
Unit Type and Rh: 6200
Unit Type and Rh: 6200

## 2019-05-12 LAB — MAGNESIUM: Magnesium: 1.9 mg/dL (ref 1.7–2.4)

## 2019-05-12 LAB — TYPE AND SCREEN
ABO/RH(D): A POS
Antibody Screen: NEGATIVE
Unit division: 0
Unit division: 0

## 2019-05-12 LAB — CALCIUM, IONIZED: Calcium, Ionized, Serum: 4.5 mg/dL (ref 4.5–5.6)

## 2019-05-12 LAB — GLUCOSE, CAPILLARY: Glucose-Capillary: 130 mg/dL — ABNORMAL HIGH (ref 70–99)

## 2019-05-12 MED ORDER — POTASSIUM CHLORIDE ER 10 MEQ PO TBCR: 10.0000 meq | EXTENDED_RELEASE_TABLET | Freq: Every day | ORAL | 0 refills | Status: AC

## 2019-05-12 MED ORDER — FUROSEMIDE 40 MG PO TABS: 40.0000 mg | ORAL_TABLET | Freq: Every day | ORAL | 11 refills | Status: AC

## 2019-05-12 MED ORDER — SENNOSIDES-DOCUSATE SODIUM 8.6-50 MG PO TABS: 2.0000 | ORAL_TABLET | Freq: Every evening | ORAL | 0 refills | Status: AC | PRN

## 2019-05-12 MED ORDER — VITAMIN D (ERGOCALCIFEROL) 1.25 MG (50000 UNIT) PO CAPS: 50000.0000 [IU] | ORAL_CAPSULE | ORAL | 0 refills | Status: AC

## 2019-05-12 MED ORDER — FERROUS SULFATE 325 (65 FE) MG PO TABS: 325.0000 mg | ORAL_TABLET | Freq: Two times a day (BID) | ORAL | 1 refills | Status: AC

## 2019-05-12 MED ORDER — POLYETHYLENE GLYCOL 3350 17 G PO PACK: 17.0000 g | PACK | Freq: Every day | ORAL | 0 refills | Status: AC | PRN

## 2019-05-12 MED ORDER — LEVOTHYROXINE SODIUM 50 MCG PO TABS: 50.0000 ug | ORAL_TABLET | Freq: Every day | ORAL | 3 refills | Status: AC

## 2019-05-12 NOTE — Discharge Summary (Signed)
Physician Discharge Summary  [place] PYP:[id number] DOB: November 03, 1970 DOA: 05/10/2019  PCP: Alveta Heimlich, FNP  Admit date: 05/10/2019 Discharge date: 05/12/2019  Admitted From: Home  Disposition: Home  Recommendations for Outpatient Follow-up:  1. Follow up with PCP in 1-2 weeks 2. Lab work in 1 week-BMP/CBC 3. Iron supplements with bowel regimen prescribed 4. High dose of weekly vitamin D prescribed for 2 months and then will need daily vitamin D by PCP 5. Continue daily Synthroid 6. Daily Lasix with potassium supplements given 7. Needs outpatient sleep apnea study  Discharge Condition: Stable CODE STATUS: Full code Diet recommendation: Heart healthy, 2 g salt diet, fluid restriction 1800 cc daily  Brief/Interim Summary: 49 year old with a history of COPD, DM 2, HTN, hepatic cirrhosis, chronic hep B on tenofovir, hypothyroidism, morbid obesity with BMI greater than 45, depression presented with shortness of breath.  Upon admission she was found to be in CHF exacerbation complicated by severe iron deficiency anemia.  She likely also has undiagnosed obstructive sleep apnea.  During the hospitalization she was diuresed which symptomatically made her feel better.  Diagnosed with vitamin D deficiency, iron deficiency.  Recommend outpatient follow-up as directed above.   Assessment & Plan:    Acute hypoxic respiratory failure secondary to congestive heart failure with preserved ejection fraction, EF 65% with grade 2 diastolic dysfunction. -Symptomatically feeling much better no longer requiring oxygen.  Echocardiogram showed grade 2 diastolic dysfunction.  Will transition to Lasix 40 mg daily with potassium supplements.  Outpatient lab work in 1 week.  Other medications can slowly be added as deemed appropriate.  Normocytic iron deficiency anemia -Hemoccult negative, suspect heavy menstruation.  Received IV iron during the hospital.  Transition to p.o. iron supplements  with bowel regimen.  Recommend outpatient follow-up with PCP and obtain routine screening including C scope as appropriate.  Morbid obesity with BMI greater than 45 -Suspect some sort of obesity hypoventilation syndrome/sleep apnea.  She will really need outpatient sleep study eventually.  Hypocalcemia  Vit d Deficiency -Weekly vitamin D, high doses thereafter she will should be prescribed daily vitamin D by PCP.  Diabetes mellitus type 2, uncontrolled secondary to hyperglycemia -Continue insulin sliding scale and Accu-Chek. -Hemoglobin A1c- 9.3 -Supportive care.  Hypothyroidism -Due to noncompliance elevated TSH, low free T4.  Continue Synthroid 50 mcg daily.  Recheck lab work in 4 weeks.  PT/OT evaluation prior to discharge.  Patient does not like taking diuretics, I fear that she may stay noncompliant.  Discharge Diagnoses:  Principal Problem:   CHF (congestive heart failure) (HCC) Active Problems:   Anemia   Hyperglycemia   Type 2 diabetes mellitus (HCC)   Elevated troponin    Consultations:  None  Subjective: Feels okay no complaints.  Discharge Exam: Vitals:   05/11/19 2335 05/12/19 0320  BP: 126/86 124/75  Pulse: 85 81  Resp: (!) 22 (!) 22  Temp: 98.9 F (37.2 C) 98.4 F (36.9 C)  SpO2: 99% 94%   Vitals:   05/11/19 1850 05/11/19 2100 05/11/19 2335 05/12/19 0320  BP: (!) 166/96  126/86 124/75  Pulse: 81  85 81  Resp: (!) 24  (!) 22 (!) 22  Temp: 98.5 F (36.9 C) 98 F (36.7 C) 98.9 F (37.2 C) 98.4 F (36.9 C)  TempSrc: Oral  Oral Oral  SpO2: 100%  99% 94%  Weight:      Height:        General: Pt is alert, awake, not in acute distress, morbidly obese.  Cardiovascular: RRR, S1/S2 +, no rubs, no gallops Respiratory: CTA bilaterally, no wheezing, no rhonchi Abdominal: Soft, NT, ND, bowel sounds + Extremities: no edema, no cyanosis  Discharge Instructions   Allergies as of 05/12/2019   No Known Allergies     Medication List    STOP  taking these medications   clotrimazole 1 % cream Commonly known as: LOTRIMIN   famotidine 20 MG tablet Commonly known as: PEPCID   ibuprofen 600 MG tablet Commonly known as: ADVIL   omeprazole 40 MG capsule Commonly known as: PriLOSEC   ondansetron 4 MG disintegrating tablet Commonly known as: Zofran ODT   prochlorperazine 10 MG tablet Commonly known as: COMPAZINE   promethazine-dextromethorphan 6.25-15 MG/5ML syrup Commonly known as: PROMETHAZINE-DM   tobramycin 0.3 % ophthalmic solution Commonly known as: TOBREX     TAKE these medications   acetaminophen 500 MG tablet Commonly known as: TYLENOL Take 500 mg by mouth daily as needed for moderate pain.   albuterol 108 (90 Base) MCG/ACT inhaler Commonly known as: VENTOLIN HFA Inhale 1-2 puffs into the lungs every 6 (six) hours as needed for wheezing or shortness of breath.   ferrous sulfate 325 (65 FE) MG tablet Take 1 tablet (325 mg total) by mouth 2 (two) times daily with a meal.   furosemide 40 MG tablet Commonly known as: Lasix Take 1 tablet (40 mg total) by mouth daily.   gabapentin 800 MG tablet Commonly known as: NEURONTIN Take 800 mg by mouth 3 (three) times daily.   hydrOXYzine 25 MG tablet Commonly known as: ATARAX/VISTARIL Take 25 mg by mouth 3 (three) times daily as needed. for anxiety   insulin glargine 100 unit/mL Sopn Commonly known as: LANTUS Inject 60 Units into the skin 2 (two) times daily.   levothyroxine 50 MCG tablet Commonly known as: SYNTHROID Take 50 mcg by mouth daily.   losartan 100 MG tablet Commonly known as: COZAAR Take 1 tablet (100 mg total) by mouth daily.   polyethylene glycol 17 g packet Commonly known as: MIRALAX / GLYCOLAX Take 17 g by mouth daily as needed for moderate constipation or severe constipation.   potassium chloride 10 MEQ tablet Commonly known as: KLOR-CON Take 1 tablet (10 mEq total) by mouth daily.   senna-docusate 8.6-50 MG tablet Commonly known  as: Senokot-S Take 2 tablets by mouth at bedtime as needed for mild constipation or moderate constipation.   tenofovir 300 MG tablet Commonly known as: VIREAD Take 300 mg by mouth daily.   tiZANidine 4 MG tablet Commonly known as: ZANAFLEX Take 4 mg by mouth 3 (three) times daily as needed for muscle spasms.   Vitamin D (Ergocalciferol) 1.25 MG (50000 UNIT) Caps capsule Commonly known as: DRISDOL Take 1 capsule (50,000 Units total) by mouth every 7 (seven) days for 8 doses. Start taking on: May 18, 2019   zolpidem 10 MG tablet Commonly known as: AMBIEN Take 10 mg by mouth at bedtime as needed for sleep.      Follow-up Information    Alveta Heimlich, FNP. Schedule an appointment as soon as possible for a visit in 1 week(s).   Specialty: Family Medicine Contact information: [address] [place] [address] [phone]          No Known Allergies  You were cared for by a hospitalist during your hospital stay. If you have any questions about your discharge medications or the care you received while you were in the hospital after you are discharged, you can call  the unit and asked to speak with the hospitalist on call if the hospitalist that took care of you is not available. Once you are discharged, your primary care physician will handle any further medical issues. Please note that no refills for any discharge medications will be authorized once you are discharged, as it is imperative that you return to your primary care physician (or establish a relationship with a primary care physician if you do not have one) for your aftercare needs so that they can reassess your need for medications and monitor your lab values.   Procedures/Studies: DG Chest 2 View  Result Date: 05/10/2019 CLINICAL DATA:  49 year old female with shortness of breath. EXAM: CHEST - 2 VIEW COMPARISON:  Chest radiograph dated 01/11/2019. FINDINGS: Top-normal cardiac size. There is mild vascular congestion.  Pneumonia is not excluded. Clinical correlation is recommended. No lobar consolidation, large pleural effusion, pneumothorax. No acute osseous pathology. IMPRESSION: Mild vascular congestion. Pneumonia is not excluded. Electronically Signed   By: Elgie Collard M.D.   On: 05/10/2019 00:53   ECHOCARDIOGRAM COMPLETE  Result Date: 05/10/2019    ECHOCARDIOGRAM REPORT   Patient Name:   Sheila Gibson Date of Exam: 05/10/2019 Medical Rec #:  [id number]              Height:       67.0 in Accession #:    [id number]             Weight:       310.0 lb Date of Birth:  27-Sep-1970              BSA:          2.436 m Patient Age:    48 years               BP:           147/91 mmHg Patient Gender: F                      HR:           84 bpm. Exam Location:  Inpatient Procedure: 2D Echo, Cardiac Doppler and Color Doppler Indications:    CHF-Acute Diastolic 428.31 / I50.31  History:        Patient has prior history of Echocardiogram examinations, most                 recent 08/18/2017. CHF; Risk Factors:Diabetes and Non-Smoker.                 Elevated troponin.  Sonographer:    Renella Cunas RDCS Referring Phys: [id number] VASUNDHRA RATHORE IMPRESSIONS  1. Left ventricular ejection fraction, by estimation, is 60 to 65%. The left ventricle has normal function. The left ventricle has no regional wall motion abnormalities. Left ventricular diastolic parameters are consistent with Grade II diastolic dysfunction (pseudonormalization). Elevated left ventricular end-diastolic pressure.  2. Right ventricular systolic function is normal. The right ventricular size is normal. There is normal pulmonary artery systolic pressure.  3. The mitral valve is normal in structure. Trivial mitral valve regurgitation. No evidence of mitral stenosis.  4. The aortic valve is tricuspid. Aortic valve regurgitation is not visualized. No aortic stenosis is present.  5. The inferior vena cava is normal in size with greater than 50% respiratory variability,  suggesting right atrial pressure of 3 mmHg. FINDINGS  Left Ventricle: Left ventricular ejection fraction, by estimation, is 60 to 65%. The left ventricle has normal function. The left  ventricle has no regional wall motion abnormalities. The left ventricular internal cavity size was normal in size. There is  no left ventricular hypertrophy. Left ventricular diastolic parameters are consistent with Grade II diastolic dysfunction (pseudonormalization). Elevated left ventricular end-diastolic pressure. Right Ventricle: The right ventricular size is normal. No increase in right ventricular wall thickness. Right ventricular systolic function is normal. There is normal pulmonary artery systolic pressure. The tricuspid regurgitant velocity is 2.62 m/s, and  with an assumed right atrial pressure of 3 mmHg, the estimated right ventricular systolic pressure is 96.2 mmHg. Left Atrium: Left atrial size was normal in size. Right Atrium: Right atrial size was normal in size. Pericardium: There is no evidence of pericardial effusion. Mitral Valve: The mitral valve is normal in structure. Mild mitral annular calcification. Trivial mitral valve regurgitation. No evidence of mitral valve stenosis. Tricuspid Valve: The tricuspid valve is normal in structure. Tricuspid valve regurgitation is trivial. No evidence of tricuspid stenosis. Aortic Valve: The aortic valve is tricuspid. Aortic valve regurgitation is not visualized. No aortic stenosis is present. There is mild calcification of the aortic valve. Aortic valve mean gradient measures 12.0 mmHg. Aortic valve peak gradient measures 25.0 mmHg. Aortic valve area, by VTI measures 1.93 cm. Pulmonic Valve: The pulmonic valve was not well visualized. Pulmonic valve regurgitation is not visualized. No evidence of pulmonic stenosis. Aorta: The aortic root, ascending aorta and aortic arch are all structurally normal, with no evidence of dilitation or obstruction. Venous: The inferior vena  cava is normal in size with greater than 50% respiratory variability, suggesting right atrial pressure of 3 mmHg. IAS/Shunts: The atrial septum is grossly normal.  LEFT VENTRICLE PLAX 2D LVIDd:         4.90 cm      Diastology LVIDs:         3.50 cm      LV e' lateral:   7.40 cm/s LV PW:         0.80 cm      LV E/e' lateral: 18.4 LV IVS:        0.80 cm      LV e' medial:    6.20 cm/s LVOT diam:     1.90 cm      LV E/e' medial:  21.9 LV SV:         91 LV SV Index:   37 LVOT Area:     2.84 cm  LV Volumes (MOD) LV vol d, MOD A2C: 150.0 ml LV vol d, MOD A4C: 164.0 ml LV vol s, MOD A2C: 49.0 ml LV vol s, MOD A4C: 64.0 ml LV SV MOD A2C:     101.0 ml LV SV MOD A4C:     164.0 ml LV SV MOD BP:      100.9 ml RIGHT VENTRICLE RV S prime:     14.30 cm/s TAPSE (M-mode): 1.8 cm LEFT ATRIUM             Index       RIGHT ATRIUM           Index LA diam:        4.30 cm 1.76 cm/m  RA Area:     13.80 cm LA Vol (A2C):   54.1 ml 22.21 ml/m RA Volume:   34.80 ml  14.28 ml/m LA Vol (A4C):   59.0 ml 24.22 ml/m LA Biplane Vol: 62.3 ml 25.57 ml/m  AORTIC VALVE AV Area (Vmax):    1.78 cm AV Area (Vmean):   1.90  cm AV Area (VTI):     1.93 cm AV Vmax:           250.00 cm/s AV Vmean:          166.000 cm/s AV VTI:            0.471 m AV Peak Grad:      25.0 mmHg AV Mean Grad:      12.0 mmHg LVOT Vmax:         157.00 cm/s LVOT Vmean:        111.000 cm/s LVOT VTI:          0.321 m LVOT/AV VTI ratio: 0.68  AORTA Ao Root diam: 3.20 cm MITRAL VALVE                TRICUSPID VALVE MV Area (PHT): 3.34 cm     TR Peak grad:   27.5 mmHg MV Decel Time: 227 msec     TR Vmax:        262.00 cm/s MV E velocity: 136.00 cm/s MV A velocity: 129.00 cm/s  SHUNTS MV E/A ratio:  1.05         Systemic VTI:  0.32 m                             Systemic Diam: 1.90 cm Jodelle RedBridgette Christopher MD Electronically signed by Jodelle RedBridgette Christopher MD Signature Date/Time: 05/10/2019/4:28:19 PM    Final       The results of significant diagnostics from this hospitalization  (including imaging, microbiology, ancillary and laboratory) are listed below for reference.     Microbiology: Recent Results (from the past 240 hour(s))  Respiratory Panel by RT PCR (Flu A&B, Covid) - Nasopharyngeal Swab     Status: None   Collection Time: 05/10/19  1:44 AM   Specimen: Nasopharyngeal Swab  Result Value Ref Range Status   SARS Coronavirus 2 by RT PCR NEGATIVE NEGATIVE Final    Comment: (NOTE) SARS-CoV-2 target nucleic acids are NOT DETECTED. The SARS-CoV-2 RNA is generally detectable in upper respiratoy specimens during the acute phase of infection. The lowest concentration of SARS-CoV-2 viral copies this assay can detect is 131 copies/mL. A negative result does not preclude SARS-Cov-2 infection and should not be used as the sole basis for treatment or other patient management decisions. A negative result may occur with  improper specimen collection/handling, submission of specimen other than nasopharyngeal swab, presence of viral mutation(s) within the areas targeted by this assay, and inadequate number of viral copies (<131 copies/mL). A negative result must be combined with clinical observations, patient history, and epidemiological information. The expected result is Negative. Fact Sheet for Patients:  https://www.moore.com/https://www.fda.gov/media/142436/download Fact Sheet for Healthcare Providers:  https://www.young.biz/https://www.fda.gov/media/142435/download This test is not yet ap proved or cleared by the [place] FDA and  has been authorized for detection and/or diagnosis of SARS-CoV-2 by FDA under an Emergency Use Authorization (EUA). This EUA will remain  in effect (meaning this test can be used) for the duration of the COVID-19 declaration under Section 564(b)(1) of the Act, 21 U.S.C. section 360bbb-3(b)(1), unless the authorization is terminated or revoked sooner.    Influenza A by PCR NEGATIVE NEGATIVE Final   Influenza B by PCR NEGATIVE NEGATIVE Final    Comment: (NOTE) The Xpert  Xpress SARS-CoV-2/FLU/RSV assay is intended as an aid in  the diagnosis of influenza from Nasopharyngeal swab specimens and  should not be used as a sole basis for  treatment. Nasal washings and  aspirates are unacceptable for Xpert Xpress SARS-CoV-2/FLU/RSV  testing. Fact Sheet for Patients: https://www.moore.com/ Fact Sheet for Healthcare Providers: https://www.young.biz/ This test is not yet approved or cleared by the [place] FDA and  has been authorized for detection and/or diagnosis of SARS-CoV-2 by  FDA under an Emergency Use Authorization (EUA). This EUA will remain  in effect (meaning this test can be used) for the duration of the  Covid-19 declaration under Section 564(b)(1) of the Act, 21  U.S.C. section 360bbb-3(b)(1), unless the authorization is  terminated or revoked. Performed at [facility], [address] [place], [place] [address]      Labs: BNP (last 3 results) Recent Labs    05/10/19 0144  BNP 142.9*   Basic Metabolic Panel: Recent Labs  Lab 05/10/19 0144 05/11/19 0454 05/12/19 0501  NA 137 138 138  K 3.6 3.8 4.1  CL 110 108 105  CO2 21* 26 25  GLUCOSE 264* 86 108*  BUN CREATININE 1.08* 1.36* 1.23*  CALCIUM 7.7* 7.7* 7.8*  MG 1.9  --  1.9   Liver Function Tests: Recent Labs  Lab 05/10/19 0144  AST 41  ALT 28  ALKPHOS 141*  BILITOT 0.8  PROT 8.2*  ALBUMIN 2.5*   No results for input(s): LIPASE, AMYLASE in the last 168 hours. No results for input(s): AMMONIA in the last 168 hours. CBC: Recent Labs  Lab 05/10/19 0144 05/10/19 0144 05/10/19 0549 05/10/19 1244 05/11/19 0454 05/11/19 1941 05/12/19 0501  WBC 7.9  --   --  8.1 8.1  --  7.5  NEUTROABS 4.5  --   --   --   --   --   --   HGB 6.7*   < > 6.6* 7.6* 7.0* 8.3* 8.1*  HCT 22.5*   < > 22.4* 25.1* 23.5* 27.1* 26.5*  MCV 82.7  --   --  82.6 82.7  --  82.0  PLT 161  --   --  179 146*  --  154   < > = values  in this interval not displayed.   Cardiac Enzymes: No results for input(s): CKTOTAL, CKMB, CKMBINDEX, TROPONINI in the last 168 hours. BNP: Invalid input(s): POCBNP CBG: Recent Labs  Lab 05/11/19 0812 05/11/19 1020 05/11/19 1209 05/11/19 1653 05/11/19 2119  GLUCAP 89 142* 116* 82 94   D-Dimer No results for input(s): DDIMER in the last 72 hours. Hgb A1c Recent Labs    05/10/19 0549  HGBA1C 9.3*   Lipid Profile No results for input(s): CHOL, HDL, LDLCALC, TRIG, CHOLHDL, LDLDIRECT in the last 72 hours. Thyroid function studies Recent Labs    05/10/19 0549  TSH 9.846*   Anemia work up Recent Labs    05/10/19 0402 05/10/19 0549  VITAMINB12  --  839  FOLATE 6.9  --   FERRITIN  --  9*  TIBC  --  382  IRON  --  16*  RETICCTPCT  --  2.1   Urinalysis    Component Value Date/Time   COLORURINE AMBER (A) 09/10/2017 2045   APPEARANCEUR CLEAR 09/10/2017 2045   LABSPEC 1.015 03/31/2018 1419   PHURINE 6.5 03/31/2018 1419   GLUCOSEU NEGATIVE 03/31/2018 1419   HGBUR TRACE (A) 03/31/2018 1419   BILIRUBINUR NEGATIVE 03/31/2018 1419   BILIRUBINUR small 07/21/2017 1601   KETONESUR NEGATIVE 03/31/2018 1419   PROTEINUR 30 (A) 03/31/2018 1419   UROBILINOGEN 0.2 03/31/2018 1419   NITRITE NEGATIVE 03/31/2018 1419  LEUKOCYTESUR MODERATE (A) 03/31/2018 1419   Sepsis Labs Invalid input(s): PROCALCITONIN,  WBC,  LACTICIDVEN Microbiology Recent Results (from the past 240 hour(s))  Respiratory Panel by RT PCR (Flu A&B, Covid) - Nasopharyngeal Swab     Status: None   Collection Time: 05/10/19  1:44 AM   Specimen: Nasopharyngeal Swab  Result Value Ref Range Status   SARS Coronavirus 2 by RT PCR NEGATIVE NEGATIVE Final    Comment: (NOTE) SARS-CoV-2 target nucleic acids are NOT DETECTED. The SARS-CoV-2 RNA is generally detectable in upper respiratoy specimens during the acute phase of infection. The lowest concentration of SARS-CoV-2 viral copies this assay can detect is 131  copies/mL. A negative result does not preclude SARS-Cov-2 infection and should not be used as the sole basis for treatment or other patient management decisions. A negative result may occur with  improper specimen collection/handling, submission of specimen other than nasopharyngeal swab, presence of viral mutation(s) within the areas targeted by this assay, and inadequate number of viral copies (<131 copies/mL). A negative result must be combined with clinical observations, patient history, and epidemiological information. The expected result is Negative. Fact Sheet for Patients:  https://www.moore.com/ Fact Sheet for Healthcare Providers:  https://www.young.biz/ This test is not yet ap proved or cleared by the [place] FDA and  has been authorized for detection and/or diagnosis of SARS-CoV-2 by FDA under an Emergency Use Authorization (EUA). This EUA will remain  in effect (meaning this test can be used) for the duration of the COVID-19 declaration under Section 564(b)(1) of the Act, 21 U.S.C. section 360bbb-3(b)(1), unless the authorization is terminated or revoked sooner.    Influenza A by PCR NEGATIVE NEGATIVE Final   Influenza B by PCR NEGATIVE NEGATIVE Final    Comment: (NOTE) The Xpert Xpress SARS-CoV-2/FLU/RSV assay is intended as an aid in  the diagnosis of influenza from Nasopharyngeal swab specimens and  should not be used as a sole basis for treatment. Nasal washings and  aspirates are unacceptable for Xpert Xpress SARS-CoV-2/FLU/RSV  testing. Fact Sheet for Patients: https://www.moore.com/ Fact Sheet for Healthcare Providers: https://www.young.biz/ This test is not yet approved or cleared by the [place] FDA and  has been authorized for detection and/or diagnosis of SARS-CoV-2 by  FDA under an Emergency Use Authorization (EUA). This EUA will remain  in effect (meaning this test can  be used) for the duration of the  Covid-19 declaration under Section 564(b)(1) of the Act, 21  U.S.C. section 360bbb-3(b)(1), unless the authorization is  terminated or revoked. Performed at [facility], [address], [place], [place] [address]      Time coordinating discharge:  I have spent 35 minutes face to face with the patient and on the ward discussing the patients care, assessment, plan and disposition with other care givers. >50% of the time was devoted counseling the patient about the risks and benefits of treatment/Discharge disposition and coordinating care.   SIGNED:   Dimple Nanas, MD  [facility] 05/12/2019, 10:11 AM   If 7PM-7AM, please contact night-coverage

## 2019-05-12 NOTE — TOC Transition Note (Signed)
Transition of Care [facility]) - CM/SW Discharge Note   Patient Details  Name: Sheila Gibson MRN: [id number] Date of Birth: [birth year]  [facility]) CM/SW Contact:  Darleene Cleaver, LCSW Phone Number: 05/12/2019, 4:33 PM   Clinical Narrative:       Final next level of care: Home/Self Care Barriers to Discharge: Barriers Resolved   Patient Goals and CMS Choice Patient states their goals for this hospitalization and ongoing recovery are:: To return back home CMS Medicare.gov Compare Post Acute Care list provided to:: Patient Choice offered to / list presented to : Patient  Discharge Placement  Patient will be discharging home.  CSW was informed that patient will need a bariatric/heavy duty bedside commode.  CSW notified physician and [employer], orders have been received.  PT recommended outpatient therapy.  CSW signing off, patient discharging back home.               Discharge Plan and Services                DME Arranged: Bedside commode DME Agency: [facility] Date DME Agency Contacted: 05/12/19 Time DME Agency Contacted: [phone] Representative spoke with at DME Agency: Ian Malkin HH Arranged: NA HH Agency: NA        Social Determinants of Health (SDOH) Interventions     Readmission Risk Interventions No flowsheet data found.

## 2019-05-12 NOTE — Evaluation (Signed)
Occupational Therapy Evaluation Patient Details Name: Sheila Gibson MRN: [id number] DOB: 07-03-1970 Today's Date: 05/12/2019    History of Present Illness 49 year old with a history of COPD, DM 2, HTN, hepatic cirrhosis, chronic hep B on tenofovir, hypothyroidism, morbid obesity with BMI greater than 45, depression presented with shortness of breath.  Upon admission she was found to be in CHF exacerbation complicated by severe iron deficiency anemia.  She likely also has undiagnosed obstructive sleep apnea.  During the hospitalization she was diuresed which symptomatically made her feel better.  Diagnosed with vitamin D deficiency, iron deficiency.  Recommend outpatient follow-up as directed above.   Clinical Impression   Sheila Gibson is a 49 year old woman admitted to the hospital with shortness of breath and found to be in CHF exacerbation. On evaluation patient demonstrates good strength, ability to perform functional mobility and baseline ADLs. Patient is limited by need for frequent urination secondary to medication. Patient would benefit from [facility] at home as she reports she hasn't been able to make it to the bathroom. Patient has no further OT needs but may benefit from PT services at discharge as she reports difficulty with traversing steps to get into apartment.    Follow Up Recommendations  No OT follow up    Equipment Recommendations  (Patient needs a bariatric BSC.)    Recommendations for Other Services       Precautions / Restrictions Precautions Precautions: None Restrictions Weight Bearing Restrictions: No      Mobility Bed Mobility               General bed mobility comments: Patient able to perform bed mobility with supervision.  Transfers                 General transfer comment: Patient supervision for ambulation to bathroom and in room without supervision from therapist. No loss of balance. No unsafe movement.    Balance Overall  balance assessment: No apparent balance deficits (not formally assessed)                                         ADL either performed or assessed with clinical judgement   ADL Overall ADL's : Modified independent                                       General ADL Comments: Patient demonstrates ability to perform toileting, in room ambulation, standing at sink to perform grooming with supervision from therapist. No loss of balance. Patient reports difficulty with lower body dressing at baseline - and her kids help her with donning socks at times. Reports normally wearing slip on shoes. Therapist discussed use of long handled reacher for donning pants as an option for home if patient has increased difficulty. Patient reports not being able to "make it" to the bathroom at home. Therapist and patient discussed use of BSC at home for safety and to reduce patient trying to rush to the bathroom. Therapist also recommended shower chair for home use for safety.     Vision   Vision Assessment?: No apparent visual deficits     Perception     Praxis      Pertinent Vitals/Pain       Hand Dominance     Extremity/Trunk Assessment Upper Extremity Assessment  Upper Extremity Assessment: Overall WFL for tasks assessed   Lower Extremity Assessment Lower Extremity Assessment: Defer to PT evaluation   Cervical / Trunk Assessment Cervical / Trunk Assessment: Normal   Communication Communication Communication: No difficulties   Cognition Arousal/Alertness: Awake/alert Behavior During Therapy: WFL for tasks assessed/performed Overall Cognitive Status: Within Functional Limits for tasks assessed                                     General Comments       Exercises     Shoulder Instructions      Home Living Family/patient expects to be discharged to:: Private residence Living Arrangements: Children Available Help at Discharge: Family Type of  Home: Apartment Home Access: Stairs to enter [employer] of Steps: approx 20, up stairs and then down stairs to enter apartment   Home Layout: One level     Bathroom Shower/Tub: Teacher, early years/pre: Standard Bathroom Accessibility: No   Home Equipment: None          Prior Functioning/Environment Level of Independence: Independent        Comments: Reports difficulty with stairs prior to admission.        OT Problem List: Obesity;Decreased activity tolerance      OT Treatment/Interventions:      OT Goals(Current goals can be found in the care plan section)    OT Frequency:     Barriers to D/C:            Co-evaluation              AM-PAC OT "6 Clicks" Daily Activity     Outcome Measure Help from another person eating meals?: None Help from another person taking care of personal grooming?: None Help from another person toileting, which includes using toliet, bedpan, or urinal?: A Little Help from another person bathing (including washing, rinsing, drying)?: A Little Help from another person to put on and taking off regular upper body clothing?: A Little   6 Click Score: 17   End of Session    Activity Tolerance: Patient tolerated treatment well Patient left: in bed(sitting on side of bed per patient request. Nursing notified.)  OT Visit Diagnosis: Muscle weakness (generalized) (M62.81)                Time: 5462-7035 OT Time Calculation (min): 14 min Charges:  OT General Charges $OT Visit: 1 Visit OT Evaluation $OT Eval Low Complexity: 1 Low  Natelie Ostrosky, OTR/L Acute Care Rehab Services  Office [phone]   Lenward Chancellor 05/12/2019, 10:40 AM

## 2019-05-12 NOTE — Evaluation (Addendum)
Physical Therapy Evaluation Patient Details Name: Sheila Gibson MRN: [id number] DOB: 03-24-1970 Today's Date: 05/12/2019   History of Present Illness  49 year old with a history of COPD, DM 2, HTN, hepatic cirrhosis, chronic hep B on tenofovir, hypothyroidism, morbid obesity with BMI greater than 45, depression presented with shortness of breath.  Upon admission she was found to be in CHF exacerbation complicated by severe iron deficiency anemia.  She likely also has undiagnosed obstructive sleep apnea.  During the hospitalization she was diuresed which symptomatically made her feel better.  Diagnosed with vitamin D deficiency, iron deficiency.  Recommend outpatient follow-up as directed above.    Clinical Impression  Physical therapy evaluation completed, patient reports feeling like she is at baseline. Upon further discussion, recommending OPPT due to insurance, past difficulty with stairs and previous success with PT. Patient discharged to care of nursing for ambulation daily as tolerated for length of stay.     Follow Up Recommendations Outpatient PT    Equipment Recommendations  Other (comment)(bariatric BSC)    Recommendations for Other Services       Precautions / Restrictions Precautions Precautions: None Restrictions Weight Bearing Restrictions: No      Mobility  Bed Mobility Overal bed mobility: Modified Independent  General bed mobility comments: increased time, slightly labored  Transfers Overall transfer level: Modified independent Equipment used: None  General transfer comment: increased time with STS transfers, slightly labored  Ambulation/Gait Ambulation/Gait assistance: Modified independent (Device/Increase time) Gait Distance (Feet): 68 Feet Assistive device: None Gait Pattern/deviations: Step-through pattern;Wide base of support Gait velocity: decreased   General Gait Details: increased lateral weight-shifting, moderate shortness of breath with  gait, limited by chest pain that resolves with seated rest break  Stairs            Wheelchair Mobility    Modified Rankin (Stroke Patients Only)       Balance Overall balance assessment: No apparent balance deficits (not formally assessed)               Pertinent Vitals/Pain Pain Assessment: 0-10 Pain Score: 1  Pain Location: chest pain with ambulation- resolves with seated rest Pain Descriptors / Indicators: Aching Pain Intervention(s): Limited activity within patient's tolerance;Monitored during session    Home Living Family/patient expects to be discharged to:: Private residence Living Arrangements: Children Available Help at Discharge: Family Type of Home: Apartment(in basement) Home Access: Stairs to enter Entrance Stairs-Rails: Right Entrance Stairs-Number of Steps: steps up to front porch, then steps down to basement apartment, single handrail Home Layout: One level Home Equipment: None      Prior Function Level of Independence: Independent         Comments: Pt reports has 4 children at home with her     Hand Dominance        Extremity/Trunk Assessment   Upper Extremity Assessment Upper Extremity Assessment: Defer to OT evaluation    Lower Extremity Assessment Lower Extremity Assessment: Overall WFL for tasks assessed(BLE AROM WNL, strength 4/5)    Cervical / Trunk Assessment Cervical / Trunk Assessment: Normal  Communication   Communication: No difficulties  Cognition Arousal/Alertness: Awake/alert Behavior During Therapy: WFL for tasks assessed/performed Overall Cognitive Status: Within Functional Limits for tasks assessed                   General Comments      Exercises     Assessment/Plan    PT Assessment All further PT needs can be met in the next venue of  care  PT Problem List Decreased strength;Decreased activity tolerance;Cardiopulmonary status limiting activity       PT Treatment Interventions      PT  Goals (Current goals can be found in the Care Plan section)  Acute Rehab PT Goals Patient Stated Goal: return home with kids PT Goal Formulation: With patient Time For Goal Achievement: 05/12/19 Potential to Achieve Goals: Good    Frequency     Barriers to discharge        Co-evaluation               AM-PAC PT "6 Clicks" Mobility  Outcome Measure Help needed turning from your back to your side while in a flat bed without using bedrails?: None Help needed moving from lying on your back to sitting on the side of a flat bed without using bedrails?: None Help needed moving to and from a bed to a chair (including a wheelchair)?: None Help needed standing up from a chair using your arms (e.g., wheelchair or bedside chair)?: None Help needed to walk in hospital room?: None Help needed climbing 3-5 steps with a railing? : A Little 6 Click Score: 23    End of Session   Activity Tolerance: Patient tolerated treatment well Patient left: in bed;with call bell/phone within reach(seated at EOB) Nurse Communication: Mobility status PT Visit Diagnosis: Other abnormalities of gait and mobility (R26.89)    Time: 9509-3267 PT Time Calculation (min) (ACUTE ONLY): 20 min   Charges:   PT Evaluation $PT Eval Low Complexity: 1 Low          Tori Logann Whitebread PT, DPT 05/12/19, 3:43 PM

## 2019-05-13 LAB — GLUCOSE, CAPILLARY
Glucose-Capillary: 100 mg/dL — ABNORMAL HIGH (ref 70–99)
Glucose-Capillary: 102 mg/dL — ABNORMAL HIGH (ref 70–99)
Glucose-Capillary: 81 mg/dL (ref 70–99)

## 2019-06-03 ENCOUNTER — Other Ambulatory Visit (HOSPITAL_BASED_OUTPATIENT_CLINIC_OR_DEPARTMENT_OTHER): Payer: Self-pay

## 2019-06-03 DIAGNOSIS — G4710 Hypersomnia, unspecified: Secondary | ICD-10-CM

## 2019-06-03 DIAGNOSIS — R5383 Other fatigue: Secondary | ICD-10-CM

## 2019-06-03 DIAGNOSIS — R454 Irritability and anger: Secondary | ICD-10-CM

## 2019-06-03 DIAGNOSIS — R0683 Snoring: Secondary | ICD-10-CM

## 2019-06-03 DIAGNOSIS — G4700 Insomnia, unspecified: Secondary | ICD-10-CM

## 2019-06-17 ENCOUNTER — Other Ambulatory Visit (HOSPITAL_COMMUNITY)
Admission: RE | Admit: 2019-06-17 | Discharge: 2019-06-17 | Disposition: A | Discharge status: Home / Self Care | Payer: Medicaid Other | Source: Ambulatory Visit | Attending: Internal Medicine | Admitting: Internal Medicine

## 2019-06-17 DIAGNOSIS — Z01812 Encounter for preprocedural laboratory examination: Secondary | ICD-10-CM | POA: Insufficient documentation

## 2019-06-17 DIAGNOSIS — Z20822 Contact with and (suspected) exposure to covid-19: Secondary | ICD-10-CM | POA: Diagnosis not present

## 2019-06-17 LAB — SARS CORONAVIRUS 2 (TAT 6-24 HRS): SARS Coronavirus 2: NEGATIVE

## 2019-06-19 ENCOUNTER — Ambulatory Visit (HOSPITAL_BASED_OUTPATIENT_CLINIC_OR_DEPARTMENT_OTHER): Payer: Medicaid Other | Attending: Nurse Practitioner | Admitting: Internal Medicine

## 2019-06-19 ENCOUNTER — Other Ambulatory Visit: Payer: Self-pay

## 2019-06-19 DIAGNOSIS — G4700 Insomnia, unspecified: Secondary | ICD-10-CM

## 2019-06-19 DIAGNOSIS — R0683 Snoring: Secondary | ICD-10-CM

## 2019-06-19 DIAGNOSIS — R5383 Other fatigue: Secondary | ICD-10-CM

## 2019-06-19 DIAGNOSIS — G4710 Hypersomnia, unspecified: Secondary | ICD-10-CM

## 2019-06-19 DIAGNOSIS — R454 Irritability and anger: Secondary | ICD-10-CM

## 2019-06-19 DIAGNOSIS — G4733 Obstructive sleep apnea (adult) (pediatric): Secondary | ICD-10-CM | POA: Insufficient documentation

## 2019-06-26 DIAGNOSIS — R0683 Snoring: Secondary | ICD-10-CM

## 2019-06-26 NOTE — Procedures (Signed)
Patient Name: Sheila Gibson, Sheila Gibson Date: 06/19/2019 Gender: Female D.O.B: 08/03/1970 Age (years): 48 Referring Provider: Dolores Patty NP Height (inches): 53 Interpreting Physician: Baird Lyons MD, ABSM Weight (lbs): 400 RPSGT: Gwenyth Allegra BMI: 63 MRN: [id number] Neck Size: 18.50  CLINICAL INFORMATION Sleep Study Type: Split Night CPAP Indication for sleep study: Diabetes, Hypertension, OSA Epworth Sleepiness Score: 17  SLEEP STUDY TECHNIQUE As per the AASM Manual for the Scoring of Sleep and Associated Events v2.3 (April 2016) with a hypopnea requiring 4% desaturations.  The channels recorded and monitored were frontal, central and occipital EEG, electrooculogram (EOG), submentalis EMG (chin), nasal and oral airflow, thoracic and abdominal wall motion, anterior tibialis EMG, snore microphone, electrocardiogram, and pulse oximetry. Continuous positive airway pressure (CPAP) was initiated when the patient met split night criteria and was titrated according to treat sleep-disordered breathing.  MEDICATIONS Medications self-administered by patient taken the night of the study : none reported  RESPIRATORY PARAMETERS Diagnostic  Total AHI (/hr): 56.2 RDI (/hr): 73.2 OA Index (/hr): 5.7 CA Index (/hr): 0.0 REM AHI (/hr): 98.2 NREM AHI (/hr): 52.2 Supine AHI (/hr): N/A Non-supine AHI (/hr): 56.2 Min O2 Sat (%): 68.0 Mean O2 (%): 95.7 Time below 88% (min): 5.7   Titration  Optimal Pressure (cm): 18 AHI at Optimal Pressure (/hr): 0.0 Min O2 at Optimal Pressure (%): 95.0 Supine % at Optimal (%): 99 Sleep % at Optimal (%): 89   SLEEP ARCHITECTURE The recording time for the entire night was 363.1 minutes.  During a baseline period of 183.9 minutes, the patient slept for 127.0 minutes in REM and nonREM, yielding a sleep efficiency of 69.0%%. Sleep onset after lights out was 11.8 minutes with a REM latency of 77.0 minutes. The patient spent 61.8%% of the night in stage N1  sleep, 28.7%% in stage N2 sleep, 0.8%% in stage N3 and 8.7% in REM.  During the titration period of 178.2 minutes, the patient slept for 152.8 minutes in REM and nonREM, yielding a sleep efficiency of 85.8%%. Sleep onset after CPAP initiation was 16.3 minutes with a REM latency of 37.5 minutes. The patient spent 6.5%% of the night in stage N1 sleep, 55.5%% in stage N2 sleep, 0.0%% in stage N3 and 37.9% in REM.  CARDIAC DATA The 2 lead EKG demonstrated sinus rhythm. The mean heart rate was 100.0 beats per minute. Other EKG findings include: None.  LEG MOVEMENT DATA The total Periodic Limb Movements of Sleep (PLMS) were 0. The PLMS index was 0.0 .  IMPRESSIONS - Severe obstructive sleep apnea occurred during the diagnostic portion of the study (AHI = 56.2/hour). An optimal PAP pressure was selected for this patient ( 18 cm of water) - No significant central sleep apnea occurred during the diagnostic portion of the study (CAI = 0.0/hour). - The patient had minimal or no oxygen desaturation during the diagnostic portion of the study (Min O2 = 68.0%). Min sat on CPAP 18 was 95%. - The patient snored with moderate snoring volume during the diagnostic portion of the study. - No cardiac abnormalities were noted during this study. - Clinically significant periodic limb movements did not occur during sleep.  DIAGNOSIS - Obstructive Sleep Apnea (327.23 [G47.33 ICD-10])  RECOMMENDATIONS - Trial of CPAP therapy on 18 cm H2O Or autopap 10-20. - Patient used a Large size [employer] Full Face Simplus mask and heated humidification. - Be careful with alcohol, sedatives and other CNS depressants that may worsen sleep apnea and disrupt normal sleep architecture. - Sleep hygiene should  be reviewed to assess factors that may improve sleep quality. - Weight management and regular exercise should be initiated or continued.  [Electronically signed] 06/26/2019 08:46 AM  Baird Lyons MD, ABSM Diplomate,  American Board of Sleep Medicine   NPI: [id number]                          [place] [place] [facility]  ELECTRONICALLY SIGNED ON:  06/26/2019, 8:42 AM [place] PH: [phone]   FX: [phone] [place]

## 2019-08-03 ENCOUNTER — Other Ambulatory Visit: Payer: Self-pay

## 2019-08-03 ENCOUNTER — Encounter (HOSPITAL_COMMUNITY): Payer: Self-pay | Admitting: Emergency Medicine

## 2019-08-03 ENCOUNTER — Ambulatory Visit (HOSPITAL_COMMUNITY)
Admission: EM | Admit: 2019-08-03 | Discharge: 2019-08-03 | Disposition: A | Discharge status: Home / Self Care | Payer: Medicaid Other | Attending: Emergency Medicine | Admitting: Emergency Medicine

## 2019-08-03 DIAGNOSIS — J449 Chronic obstructive pulmonary disease, unspecified: Secondary | ICD-10-CM | POA: Diagnosis not present

## 2019-08-03 DIAGNOSIS — K047 Periapical abscess without sinus: Secondary | ICD-10-CM | POA: Diagnosis not present

## 2019-08-03 MED ORDER — IPRATROPIUM-ALBUTEROL 0.5-2.5 (3) MG/3ML IN SOLN: 3.0000 mL | Freq: Four times a day (QID) | RESPIRATORY_TRACT | 0 refills | Status: AC | PRN

## 2019-08-03 MED ORDER — AMOXICILLIN-POT CLAVULANATE 875-125 MG PO TABS: 1.0000 | ORAL_TABLET | Freq: Two times a day (BID) | ORAL | 0 refills | Status: AC

## 2019-08-03 MED ORDER — CEFTRIAXONE SODIUM 1 G IJ SOLR
1.0000 g | Freq: Once | INTRAMUSCULAR | Status: AC
  Administered 2019-08-03: 1 g via INTRAMUSCULAR

## 2019-08-03 MED ORDER — LIDOCAINE HCL (PF) 1 % IJ SOLN
INTRAMUSCULAR | Status: AC
  Filled 2019-08-03: qty 2

## 2019-08-03 MED ORDER — CEFTRIAXONE SODIUM 1 G IJ SOLR
INTRAMUSCULAR | Status: AC
  Filled 2019-08-03: qty 10

## 2019-08-03 NOTE — Discharge Instructions (Signed)
Dental abscess -We gave you an injection of Rocephin which is an antibiotic -Continue with Augmentin twice daily for 1 week -Tylenol and ibuprofen around-the-clock for fever pain and swelling -Warm compresses -If not seeing any improvement within 48 hours or symptoms worsening, developing neck stiffness, increased swelling, persistent fevers follow-up in emergency room  COPD -Please continue to use inhalers and nebulizers as prescribed, I refilled nebulizers -Monitor result of Covid swab from today -If you feel your breathing symptoms worsening or changing from normal please follow-up

## 2019-08-03 NOTE — ED Provider Notes (Signed)
[facility]    CSN: [id number] Arrival date & time: 08/03/19  1629      History   Chief Complaint Chief Complaint  Patient presents with  . Abscess    HPI [address] Sheila Gibson is a 49 y.o. female history of COPD, DM type II, CHF, presenting today for evaluation of dental abscess.  Patient reports that over the past 24 hours she has developed increased pain and swelling to her left lower jaw.  Reports she pulled a tooth in this area approximately 2 months ago at home.  She does feel of recently she has had some throat irritation and some congestion.  She denies any increase in cough from her baseline.  Denies any difficulty breathing or shortness of breath.  Does report using nebulizers and CPAP at home.  Reports increased breathing related to wearing mask currently.  Fevers began today.  Reports that she gets Covid tested weekly from her primary doctor as she recently had an MI in May 2021.  Swab pending from today.  She denies any URI symptoms or feeling under the weather before today.  She feels her breathing is at baseline.  HPI  Past Medical History:  Diagnosis Date  . COPD (chronic obstructive pulmonary disease) (HCC)   . Depression   . Diabetes mellitus without complication (HCC)   . Heart murmur   . Hepatic cirrhosis (HCC)   . Hypertension   . Obesity   . Thyroid disease     Patient Active Problem List   Diagnosis Date Noted  . CHF (congestive heart failure) (HCC) 05/10/2019  . Anemia 05/10/2019  . Hyperglycemia 05/10/2019  . Type 2 diabetes mellitus (HCC) 05/10/2019  . Elevated troponin 05/10/2019  . Warts, genital 08/20/2017  . Hypertensive urgency 08/17/2017    Past Surgical History:  Procedure Laterality Date  . CESAREAN SECTION    . ESOPHAGOGASTRODUODENOSCOPY (EGD) WITH PROPOFOL N/A 09/01/2017   Procedure: ESOPHAGOGASTRODUODENOSCOPY (EGD) WITH PROPOFOL;  Surgeon: Kathi Der, MD;  Location: [facility];  Service: Gastroenterology;   Laterality: N/A;  . THYROIDECTOMY      OB History    Gravida  6   Para  5   Term  5   Preterm      AB  1   Living  4     SAB  1   TAB      Ectopic      Multiple      Live Births  4            Home Medications    Prior to Admission medications   Medication Sig Start Date End Date Taking? Authorizing Provider  acetaminophen (TYLENOL) 500 MG tablet Take 500 mg by mouth daily as needed for moderate pain.    [provider]  albuterol (PROVENTIL HFA;VENTOLIN HFA) 108 (90 BASE) MCG/ACT inhaler Inhale 1-2 puffs into the lungs every 6 (six) hours as needed for wheezing or shortness of breath.    [provider]  amoxicillin-clavulanate (AUGMENTIN) 875-125 MG tablet Take 1 tablet by mouth every 12 (twelve) hours for 7 days. 08/03/19 08/10/19  Cashtyn Pouliot C, PA-C  ferrous sulfate 325 (65 FE) MG tablet Take 1 tablet (325 mg total) by mouth 2 (two) times daily with a meal. 05/12/19   Amin, Loura Halt, MD  furosemide (LASIX) 40 MG tablet Take 1 tablet (40 mg total) by mouth daily. 05/12/19 05/11/20  Amin, Loura Halt, MD  gabapentin (NEURONTIN) 800 MG tablet Take 800 mg by mouth  3 (three) times daily.     [provider]  hydrOXYzine (ATARAX/VISTARIL) 25 MG tablet Take 25 mg by mouth 3 (three) times daily as needed. for anxiety 08/05/17   [provider]  insulin glargine (LANTUS) 100 unit/mL SOPN Inject 60 Units into the skin 2 (two) times daily.     [provider]  ipratropium-albuterol (DUONEB) 0.5-2.5 (3) MG/3ML SOLN Take 3 mLs by nebulization every 6 (six) hours as needed. 08/03/19   Ermalee Mealy C, PA-C  levothyroxine (SYNTHROID) 50 MCG tablet Take 1 tablet (50 mcg total) by mouth daily before breakfast. 05/12/19   Amin, Loura Halt, MD  losartan (COZAAR) 100 MG tablet Take 1 tablet (100 mg total) by mouth daily. 01/18/18   Wallis Bamberg, PA-C  polyethylene glycol (MIRALAX / GLYCOLAX) 17 g packet Take 17 g by mouth daily as needed for  moderate constipation or severe constipation. 05/12/19   Amin, Loura Halt, MD  potassium chloride (KLOR-CON) 10 MEQ tablet Take 1 tablet (10 mEq total) by mouth daily. 05/12/19   Amin, Loura Halt, MD  senna-docusate (SENOKOT-S) 8.6-50 MG tablet Take 2 tablets by mouth at bedtime as needed for mild constipation or moderate constipation. 05/12/19   Amin, Loura Halt, MD  tenofovir (VIREAD) 300 MG tablet Take 300 mg by mouth daily. 08/03/17   [provider]  tiZANidine (ZANAFLEX) 4 MG tablet Take 4 mg by mouth 3 (three) times daily as needed for muscle spasms.     [provider]  zolpidem (AMBIEN) 10 MG tablet Take 10 mg by mouth at bedtime as needed for sleep.  01/23/17   [provider]    Family History Family History  Problem Relation Age of Onset  . Cancer Mother   . Hypertension Other     Social History Social History   Tobacco Use  . Smoking status: Never Smoker  . Smokeless tobacco: Never Used  Vaping Use  . Vaping Use: Never used  Substance Use Topics  . Alcohol use: Not Currently  . Drug use: No     Allergies   Patient has no known allergies.   Review of Systems Review of Systems  Constitutional: Positive for fever. Negative for activity change, appetite change, chills and fatigue.  HENT: Positive for congestion, dental problem, facial swelling and sore throat. Negative for ear pain, rhinorrhea, sinus pressure and trouble swallowing.   Eyes: Negative for discharge and redness.  Respiratory: Negative for cough, chest tightness and shortness of breath.   Cardiovascular: Negative for chest pain.  Gastrointestinal: Negative for abdominal pain, diarrhea, nausea and vomiting.  Musculoskeletal: Negative for myalgias.  Skin: Negative for rash.  Neurological: Negative for dizziness, light-headedness and headaches.     Physical Exam Triage Vital Signs ED Triage Vitals  Enc Vitals Group     BP 08/03/19 1736 (!) 148/63     Pulse Rate 08/03/19  1736 102     Resp 08/03/19 1736 (!) 36     Temp 08/03/19 1736 (!) 101.2 F (38.4 C)     Temp Source 08/03/19 1736 Oral     SpO2 08/03/19 1736 96 %     Weight --      Height --      Head Circumference --      Peak Flow --      Pain Score 08/03/19 1739 9     Pain Loc --      Pain Edu? --      Excl. in GC? --  No data found.  Updated Vital Signs BP (!) 148/63 (BP Location: Left Arm) Comment (BP Location): large cuff  Pulse 102   Temp (!) 101.2 F (38.4 C) (Oral)   Resp (!) 36   SpO2 96%   Visual Acuity Right Eye Distance:   Left Eye Distance:   Bilateral Distance:    Right Eye Near:   Left Eye Near:    Bilateral Near:     Physical Exam Vitals and nursing note reviewed.  Constitutional:      Appearance: She is well-developed.     Comments: No acute distress  HENT:     Head: Normocephalic and atraumatic.     Comments: Facial swelling noted to left lower jaw, tender to palpation over this area, no swelling noted below mandibular line    Nose: Nose normal.     Mouth/Throat:     Comments: No soft palate swelling Fractured tooth noted to left lower jaw with surrounding gingival swelling and erythema, tender to palpation of this area, no swelling or tenderness to palpation below tongue  Posterior pharynx patent without tonsillar swelling or erythema Eyes:     Conjunctiva/sclera: Conjunctivae normal.  Neck:     Comments: Full active range of motion of neck Cardiovascular:     Rate and Rhythm: Normal rate.  Pulmonary:     Effort: Pulmonary effort is normal. No respiratory distress.     Comments: Patient tachypneic with mask on, breathing slightly more comfortably when mask lowered, CTABL, no wheezing, rales or other adventitious sounds auscultated Abdominal:     General: There is no distension.  Musculoskeletal:        General: Normal range of motion.     Cervical back: Neck supple.  Skin:    General: Skin is warm and dry.  Neurological:     Mental Status: She  is alert and oriented to person, place, and time.      UC Treatments / Results  Labs (all labs ordered are listed, but only abnormal results are displayed) Labs Reviewed - No data to display  EKG   Radiology No results found.  Procedures Procedures (including critical care time)  Medications Ordered in UC Medications  cefTRIAXone (ROCEPHIN) injection 1 g (has no administration in time range)    Initial Impression / Assessment and Plan / UC Course  I have reviewed the triage vital signs and the nursing notes.  Pertinent labs & imaging results that were available during my care of the patient were reviewed by me and considered in my medical decision making (see chart for details).     1.  Dental abscess-fever beginning today with obvious facial swelling, no sign of deep space infection at this time, full active range of motion of neck, no soft palate swelling noted within mouth, will treat with IM Rocephin prior to discharge and continue with Augmentin.  Tylenol and ibuprofen for fever management pain and swelling, warm compresses.  1.  COPD-patient reports breathing at baseline, O2 96%, lungs clear to auscultation.  Had Covid test earlier today at offsite facility.  Will refill nebulizers and have patient continue to monitor her symptoms.  Advised to return if ever feeling breathing different from normal.  Discussed strict return precautions. Patient verbalized understanding and is agreeable with plan.  Final Clinical Impressions(s) / UC Diagnoses   Final diagnoses:  Dental abscess  Chronic obstructive pulmonary disease, unspecified COPD type [facility](HCC)     Discharge Instructions     Dental abscess -We gave you  an injection of Rocephin which is an antibiotic -Continue with Augmentin twice daily for 1 week -Tylenol and ibuprofen around-the-clock for fever pain and swelling -Warm compresses -If not seeing any improvement within 48 hours or symptoms worsening, developing  neck stiffness, increased swelling, persistent fevers follow-up in emergency room  COPD -Please continue to use inhalers and nebulizers as prescribed, I refilled nebulizers -Monitor result of Covid swab from today -If you feel your breathing symptoms worsening or changing from normal please follow-up     ED Prescriptions    Medication Sig Dispense Auth. Provider   amoxicillin-clavulanate (AUGMENTIN) 875-125 MG tablet Take 1 tablet by mouth every 12 (twelve) hours for 7 days. 14 tablet Dravon Nott C, PA-C   ipratropium-albuterol (DUONEB) 0.5-2.5 (3) MG/3ML SOLN Take 3 mLs by nebulization every 6 (six) hours as needed. 360 mL Elgene Coral, [place] C, PA-C     PDMP not reviewed this encounter.   Lew Dawes, [place] 08/03/19 [phone]

## 2019-08-03 NOTE — ED Triage Notes (Signed)
Patient woke this morning with abscess to left side of mouth.  Patient pulled a tooth out 2 months ago.  Last night started coughing up brown phlegm, patient has sore throat.

## 2019-10-12 ENCOUNTER — Emergency Department (HOSPITAL_COMMUNITY)
Admission: EM | Admit: 2019-10-12 | Discharge: 2019-10-12 | Disposition: A | Discharge status: Home / Self Care | Payer: Medicaid Other | Attending: Emergency Medicine | Admitting: Emergency Medicine

## 2019-10-12 ENCOUNTER — Encounter (HOSPITAL_COMMUNITY): Payer: Self-pay

## 2019-10-12 ENCOUNTER — Emergency Department (HOSPITAL_COMMUNITY): Payer: Medicaid Other

## 2019-10-12 DIAGNOSIS — Z79899 Other long term (current) drug therapy: Secondary | ICD-10-CM | POA: Insufficient documentation

## 2019-10-12 DIAGNOSIS — E079 Disorder of thyroid, unspecified: Secondary | ICD-10-CM | POA: Insufficient documentation

## 2019-10-12 DIAGNOSIS — I5023 Acute on chronic systolic (congestive) heart failure: Secondary | ICD-10-CM | POA: Insufficient documentation

## 2019-10-12 DIAGNOSIS — Z20822 Contact with and (suspected) exposure to covid-19: Secondary | ICD-10-CM | POA: Insufficient documentation

## 2019-10-12 DIAGNOSIS — Z794 Long term (current) use of insulin: Secondary | ICD-10-CM | POA: Diagnosis not present

## 2019-10-12 DIAGNOSIS — J449 Chronic obstructive pulmonary disease, unspecified: Secondary | ICD-10-CM | POA: Diagnosis not present

## 2019-10-12 DIAGNOSIS — E119 Type 2 diabetes mellitus without complications: Secondary | ICD-10-CM | POA: Insufficient documentation

## 2019-10-12 DIAGNOSIS — I509 Heart failure, unspecified: Secondary | ICD-10-CM

## 2019-10-12 DIAGNOSIS — I110 Hypertensive heart disease with heart failure: Secondary | ICD-10-CM | POA: Diagnosis not present

## 2019-10-12 DIAGNOSIS — R0602 Shortness of breath: Secondary | ICD-10-CM | POA: Diagnosis present

## 2019-10-12 LAB — CBC WITH DIFFERENTIAL/PLATELET
Abs Immature Granulocytes: 0.05 10*3/uL (ref 0.00–0.07)
Basophils Absolute: 0 10*3/uL (ref 0.0–0.1)
Basophils Relative: 1 %
Eosinophils Absolute: 0.2 10*3/uL (ref 0.0–0.5)
Eosinophils Relative: 3 %
HCT: 30.5 % — ABNORMAL LOW (ref 36.0–46.0)
Hemoglobin: 9.4 g/dL — ABNORMAL LOW (ref 12.0–15.0)
Immature Granulocytes: 1 %
Lymphocytes Relative: 29 %
Lymphs Abs: 2 10*3/uL (ref 0.7–4.0)
MCH: 29.8 pg (ref 26.0–34.0)
MCHC: 30.8 g/dL (ref 30.0–36.0)
MCV: 96.8 fL (ref 80.0–100.0)
Monocytes Absolute: 1.1 10*3/uL — ABNORMAL HIGH (ref 0.1–1.0)
Monocytes Relative: 16 %
Neutro Abs: 3.5 10*3/uL (ref 1.7–7.7)
Neutrophils Relative %: 50 %
Platelets: 109 10*3/uL — ABNORMAL LOW (ref 150–400)
RBC: 3.15 MIL/uL — ABNORMAL LOW (ref 3.87–5.11)
RDW: 15.1 % (ref 11.5–15.5)
WBC: 6.9 10*3/uL (ref 4.0–10.5)
nRBC: 0 % (ref 0.0–0.2)

## 2019-10-12 LAB — BRAIN NATRIURETIC PEPTIDE: B Natriuretic Peptide: 123.7 pg/mL — ABNORMAL HIGH (ref 0.0–100.0)

## 2019-10-12 LAB — BASIC METABOLIC PANEL
Anion gap: 6 (ref 5–15)
BUN: 19 mg/dL (ref 6–20)
CO2: 26 mmol/L (ref 22–32)
Calcium: 8 mg/dL — ABNORMAL LOW (ref 8.9–10.3)
Chloride: 108 mmol/L (ref 98–111)
Creatinine, Ser: 1.26 mg/dL — ABNORMAL HIGH (ref 0.44–1.00)
GFR calc non Af Amer: 50 mL/min — ABNORMAL LOW (ref >60)
Glucose, Bld: 206 mg/dL — ABNORMAL HIGH (ref 70–99)
Potassium: 4 mmol/L (ref 3.5–5.1)
Sodium: 140 mmol/L (ref 135–145)

## 2019-10-12 LAB — TROPONIN I (HIGH SENSITIVITY)
Troponin I (High Sensitivity): 22 ng/L — ABNORMAL HIGH (ref <18)
Troponin I (High Sensitivity): 26 ng/L — ABNORMAL HIGH (ref <18)

## 2019-10-12 LAB — RESPIRATORY PANEL BY RT PCR (FLU A&B, COVID)
Influenza A by PCR: NEGATIVE
Influenza B by PCR: NEGATIVE
SARS Coronavirus 2 by RT PCR: NEGATIVE

## 2019-10-12 MED ORDER — ALBUTEROL SULFATE HFA 108 (90 BASE) MCG/ACT IN AERS
2.0000 | INHALATION_SPRAY | RESPIRATORY_TRACT | Status: DC
  Administered 2019-10-12 (×2): 2 via RESPIRATORY_TRACT
  Filled 2019-10-12: qty 6.7

## 2019-10-12 MED ORDER — FUROSEMIDE 10 MG/ML IJ SOLN
40.0000 mg | INTRAMUSCULAR | Status: AC
  Administered 2019-10-12: 40 mg via INTRAVENOUS
  Filled 2019-10-12: qty 4

## 2019-10-12 NOTE — Discharge Instructions (Addendum)
Double your lasix dose for the next 3 days.  Notify your doctor of your ER visit and your symptoms and make an appointment for close follow-up.  Return to the ER with any new, worsening, or concerning symptoms

## 2019-10-12 NOTE — ED Provider Notes (Signed)
  Physical Exam  BP (!) 177/80 (BP Location: Right Arm)   Pulse 85   Temp 98.2 F (36.8 C) (Oral)   Resp (!) 36   Ht 5\' 7"  (1.702 m)   Wt (!) 158.8 kg   SpO2 100%   BMI 54.82 kg/m   Physical Exam   Gen: nontoxic Pulm: tachypneic. SpO2 stable on RA  ED Course/Procedures     Procedures  MDM   Pt signed out to me by , PA-C. Please see previous notes for further history.   In brief, patient presenting for evaluation of shortness of breath.  Work-up is consistent with acute on chronic heart failure.  Patient has not been hypoxic, however has been tachypneic.  She has received IV Lasix and albuterol.  She is pending repeat troponin and reassessment of symptoms.  Repeat troponin flat.  On reassessment, patient reports symptoms are slightly improved.  She has ambulated without hypoxia, however is tachypneic.  On my evaluation, patient is speaking in short sentences.  She is mildly tachypneic.  However she states she feels that she is safe to go home.  Due to patient's obesity, she likely has baseline deconditioning contributing to her tachypnea and shortness of breath today.  I reiterated that patient should take double Lasix dose and follow-up closely with her PCP.  Prompt return to the ER with any worsening symptoms.  At this time, patient appears safe for discharge.  Return precautions given.  Patient states she understands and agrees to plan.       Sheila Shay, PA-C 10/12/19 12/12/19    [id number], MD 10/12/19 [phone]

## 2019-10-12 NOTE — ED Provider Notes (Signed)
[facility]-EMERGENCY DEPT Provider Note   CSN: [id number] Arrival date & time: 10/12/19  0214     History No chief complaint on file.   [address] Pak is a 49 y.o. female.  Patient with past medical history notable for COPD, diabetes, hypertension, obesity, recently diagnosed with CHF, presents to the emergency department with a chief complaint of shortness of breath, abdominal and lower extremity swelling.  She states that the symptoms have been worsening for the past several days.  She reports having dry cough.  She denies any fevers or chills.  She denies having pain in her abdomen, but states that it does seem swollen.  She denies having chest pain.  States that her shortness of breath is worsened with activity.  She denies any other associated symptoms.  The history is provided by the patient. No language interpreter was used.       Past Medical History:  Diagnosis Date   COPD (chronic obstructive pulmonary disease) (HCC)    Depression    Diabetes mellitus without complication (HCC)    Heart murmur    Hepatic cirrhosis (HCC)    Hypertension    Obesity    Thyroid disease     Patient Active Problem List   Diagnosis Date Noted   CHF (congestive heart failure) (HCC) 05/10/2019   Anemia 05/10/2019   Hyperglycemia 05/10/2019   Type 2 diabetes mellitus (HCC) 05/10/2019   Elevated troponin 05/10/2019   Warts, genital 08/20/2017   Hypertensive urgency 08/17/2017    Past Surgical History:  Procedure Laterality Date   CESAREAN SECTION     ESOPHAGOGASTRODUODENOSCOPY (EGD) WITH PROPOFOL N/A 09/01/2017   Procedure: ESOPHAGOGASTRODUODENOSCOPY (EGD) WITH PROPOFOL;  Surgeon: Kathi Der, MD;  Location: [facility];  Service: Gastroenterology;  Laterality: N/A;   THYROIDECTOMY       OB History    Gravida  6   Para  5   Term  5   Preterm      AB  1   Living  4     SAB  1   TAB      Ectopic      Multiple       Live Births  4           Family History  Problem Relation Age of Onset   Cancer Mother    Hypertension Other     Social History   Tobacco Use   Smoking status: Never Smoker   Smokeless tobacco: Never Used  Vaping Use   Vaping Use: Never used  Substance Use Topics   Alcohol use: Not Currently   Drug use: No    Home Medications Prior to Admission medications   Medication Sig Start Date End Date Taking? Authorizing Provider  acetaminophen (TYLENOL) 500 MG tablet Take 500 mg by mouth daily as needed for moderate pain.    [provider]  albuterol (PROVENTIL HFA;VENTOLIN HFA) 108 (90 BASE) MCG/ACT inhaler Inhale 1-2 puffs into the lungs every 6 (six) hours as needed for wheezing or shortness of breath.    [provider]  ferrous sulfate 325 (65 FE) MG tablet Take 1 tablet (325 mg total) by mouth 2 (two) times daily with a meal. 05/12/19   Amin, Loura Halt, MD  furosemide (LASIX) 40 MG tablet Take 1 tablet (40 mg total) by mouth daily. 05/12/19 05/11/20  Amin, Loura Halt, MD  gabapentin (NEURONTIN) 800 MG tablet Take 800 mg by mouth 3 (three) times daily.  [provider]  hydrOXYzine (ATARAX/VISTARIL) 25 MG tablet Take 25 mg by mouth 3 (three) times daily as needed. for anxiety 08/05/17   [provider]  insulin glargine (LANTUS) 100 unit/mL SOPN Inject 60 Units into the skin 2 (two) times daily.     [provider]  ipratropium-albuterol (DUONEB) 0.5-2.5 (3) MG/3ML SOLN Take 3 mLs by nebulization every 6 (six) hours as needed. 08/03/19   Wieters, Hallie C, PA-C  levothyroxine (SYNTHROID) 50 MCG tablet Take 1 tablet (50 mcg total) by mouth daily before breakfast. 05/12/19   Amin, Loura Halt, MD  losartan (COZAAR) 100 MG tablet Take 1 tablet (100 mg total) by mouth daily. 01/18/18   Wallis Bamberg, PA-C  polyethylene glycol (MIRALAX / GLYCOLAX) 17 g packet Take 17 g by mouth daily as needed for moderate constipation or severe  constipation. 05/12/19   Amin, Loura Halt, MD  potassium chloride (KLOR-CON) 10 MEQ tablet Take 1 tablet (10 mEq total) by mouth daily. 05/12/19   Amin, Loura Halt, MD  senna-docusate (SENOKOT-S) 8.6-50 MG tablet Take 2 tablets by mouth at bedtime as needed for mild constipation or moderate constipation. 05/12/19   Amin, Loura Halt, MD  tenofovir (VIREAD) 300 MG tablet Take 300 mg by mouth daily. 08/03/17   [provider]  tiZANidine (ZANAFLEX) 4 MG tablet Take 4 mg by mouth 3 (three) times daily as needed for muscle spasms.     [provider]  zolpidem (AMBIEN) 10 MG tablet Take 10 mg by mouth at bedtime as needed for sleep.  01/23/17   [provider]    Allergies    Patient has no known allergies.  Review of Systems   Review of Systems  All other systems reviewed and are negative.   Physical Exam Updated Vital Signs BP (!) 179/78 (BP Location: Left Arm)    Pulse 95    Temp 98.9 F (37.2 C) (Oral)    Resp (!) 22    Ht 5\' 7"  (1.702 m)    Wt (!) 158.8 kg    SpO2 97%    BMI 54.82 kg/m   Physical Exam Vitals and nursing note reviewed.  Constitutional:      General: She is not in acute distress.    Appearance: She is well-developed. She is obese.  HENT:     Head: Normocephalic and atraumatic.  Eyes:     Conjunctiva/sclera: Conjunctivae normal.  Cardiovascular:     Rate and Rhythm: Normal rate and regular rhythm.     Heart sounds: No murmur heard.   Pulmonary:     Effort: Respiratory distress present.     Comments: Mild respiratory distress Diminished lung sounds Tachypnea Abdominal:     Palpations: Abdomen is soft.     Tenderness: There is no abdominal tenderness.  Musculoskeletal:     Cervical back: Neck supple.     Right lower leg: Edema present.     Left lower leg: Edema present.     Comments: 1+ pitting edema  Skin:    General: Skin is warm and dry.  Neurological:     Mental Status: She is alert and oriented to person, place, and time.    Psychiatric:        Mood and Affect: Mood normal.        Behavior: Behavior normal.     ED Results / Procedures / Treatments   Labs (all labs ordered are listed, but only abnormal results are displayed) Labs Reviewed  RESPIRATORY PANEL BY  RT PCR (FLU A&B, COVID)  CBC WITH DIFFERENTIAL/PLATELET  BASIC METABOLIC PANEL  BRAIN NATRIURETIC PEPTIDE  TROPONIN I (HIGH SENSITIVITY)    EKG None    Radiology No results found.  Procedures Procedures (including critical care time)  Medications Ordered in ED Medications  albuterol (VENTOLIN HFA) 108 (90 Base) MCG/ACT inhaler 2 puff (has no administration in time range)    ED Course  I have reviewed the triage vital signs and the nursing notes.  Pertinent labs & imaging results that were available during my care of the patient were reviewed by me and considered in my medical decision making (see chart for details).    MDM Rules/Calculators/A&P                          This patient complains of SOB, this involves an extensive number of treatment options, and is a complaint that carries with it a high risk of complications and morbidity.    Differential Dx CHF, COPD, COVID, pneumonia  Pertinent Labs I ordered, reviewed, and interpreted labs, which are notable for elevated trop at 22, BNP 123.7, Cr 1.26 (about baseline), K 4.0, covid negative  Imaging Interpretation I ordered imaging studies which included CXR.  I independently visualized and interpreted the CXR, which showed vascular congestion.   Medications I ordered medication lasix for diuresis.  Sources Previous records obtained and reviewed recently diagnosed with CHF, EF is 60-65   Critical Interventions  None  Reassessments After the interventions stated above, I reevaluated the patient and found still SOB, will continue diuresis.  Consultants None  Plan Patient to continue diuresing in the ED.  Recheck troponin and ambulate with O2 monitor.  If feeling  improved, consider discharge.  If still quite symptomatic admit for obs and further diuresis.  Signed out to oncoming team at shift change.    Final Clinical Impression(s) / ED Diagnoses Final diagnoses:  Acute on chronic congestive heart failure, unspecified heart failure type [facility])    Rx / DC Orders ED Discharge Orders    None       Roxy Horseman, PA-C 10/12/19 [id number]    Shon Baton, MD 10/12/19 [phone]

## 2019-10-12 NOTE — ED Triage Notes (Signed)
Pt complains of being short of breath and abdomen distention, pt also has a dry cough

## 2019-10-12 NOTE — ED Notes (Signed)
We patient ambulating the oxygen drop down to 96% however resp is 36

## 2019-10-18 ENCOUNTER — Emergency Department (HOSPITAL_COMMUNITY): Payer: Medicaid Other

## 2019-10-18 ENCOUNTER — Other Ambulatory Visit: Payer: Self-pay

## 2019-10-18 ENCOUNTER — Observation Stay (HOSPITAL_COMMUNITY): Payer: Medicaid Other

## 2019-10-18 ENCOUNTER — Inpatient Hospital Stay (HOSPITAL_COMMUNITY)
Admission: EM | Admit: 2019-10-18 | Discharge: 2019-10-24 | DRG: 291 | Disposition: A | Discharge status: Home / Self Care | Payer: Medicaid Other | Attending: Internal Medicine | Admitting: Internal Medicine

## 2019-10-18 ENCOUNTER — Observation Stay (HOSPITAL_BASED_OUTPATIENT_CLINIC_OR_DEPARTMENT_OTHER)
Admit: 2019-10-18 | Discharge: 2019-10-18 | Disposition: A | Discharge status: Home / Self Care | Payer: Medicaid Other | Attending: Internal Medicine | Admitting: Internal Medicine

## 2019-10-18 ENCOUNTER — Encounter (HOSPITAL_COMMUNITY): Payer: Self-pay | Admitting: Emergency Medicine

## 2019-10-18 DIAGNOSIS — K529 Noninfective gastroenteritis and colitis, unspecified: Secondary | ICD-10-CM | POA: Diagnosis not present

## 2019-10-18 DIAGNOSIS — E11649 Type 2 diabetes mellitus with hypoglycemia without coma: Secondary | ICD-10-CM | POA: Diagnosis present

## 2019-10-18 DIAGNOSIS — I5033 Acute on chronic diastolic (congestive) heart failure: Secondary | ICD-10-CM | POA: Diagnosis present

## 2019-10-18 DIAGNOSIS — E1122 Type 2 diabetes mellitus with diabetic chronic kidney disease: Secondary | ICD-10-CM | POA: Diagnosis present

## 2019-10-18 DIAGNOSIS — R609 Edema, unspecified: Secondary | ICD-10-CM | POA: Diagnosis not present

## 2019-10-18 DIAGNOSIS — I509 Heart failure, unspecified: Secondary | ICD-10-CM

## 2019-10-18 DIAGNOSIS — K7460 Unspecified cirrhosis of liver: Secondary | ICD-10-CM | POA: Diagnosis present

## 2019-10-18 DIAGNOSIS — Z8249 Family history of ischemic heart disease and other diseases of the circulatory system: Secondary | ICD-10-CM

## 2019-10-18 DIAGNOSIS — E162 Hypoglycemia, unspecified: Secondary | ICD-10-CM | POA: Diagnosis present

## 2019-10-18 DIAGNOSIS — R509 Fever, unspecified: Secondary | ICD-10-CM

## 2019-10-18 DIAGNOSIS — I130 Hypertensive heart and chronic kidney disease with heart failure and stage 1 through stage 4 chronic kidney disease, or unspecified chronic kidney disease: Principal | ICD-10-CM | POA: Diagnosis present

## 2019-10-18 DIAGNOSIS — R110 Nausea: Secondary | ICD-10-CM

## 2019-10-18 DIAGNOSIS — Z79899 Other long term (current) drug therapy: Secondary | ICD-10-CM

## 2019-10-18 DIAGNOSIS — E662 Morbid (severe) obesity with alveolar hypoventilation: Secondary | ICD-10-CM | POA: Diagnosis present

## 2019-10-18 DIAGNOSIS — R0602 Shortness of breath: Secondary | ICD-10-CM

## 2019-10-18 DIAGNOSIS — E1140 Type 2 diabetes mellitus with diabetic neuropathy, unspecified: Secondary | ICD-10-CM | POA: Diagnosis present

## 2019-10-18 DIAGNOSIS — J180 Bronchopneumonia, unspecified organism: Secondary | ICD-10-CM | POA: Diagnosis not present

## 2019-10-18 DIAGNOSIS — D696 Thrombocytopenia, unspecified: Secondary | ICD-10-CM | POA: Diagnosis present

## 2019-10-18 DIAGNOSIS — J9601 Acute respiratory failure with hypoxia: Secondary | ICD-10-CM | POA: Diagnosis present

## 2019-10-18 DIAGNOSIS — Z79890 Hormone replacement therapy (postmenopausal): Secondary | ICD-10-CM

## 2019-10-18 DIAGNOSIS — Z6841 Body Mass Index (BMI) 40.0 and over, adult: Secondary | ICD-10-CM

## 2019-10-18 DIAGNOSIS — D649 Anemia, unspecified: Secondary | ICD-10-CM | POA: Diagnosis present

## 2019-10-18 DIAGNOSIS — Z794 Long term (current) use of insulin: Secondary | ICD-10-CM

## 2019-10-18 DIAGNOSIS — E079 Disorder of thyroid, unspecified: Secondary | ICD-10-CM | POA: Diagnosis present

## 2019-10-18 DIAGNOSIS — Z20822 Contact with and (suspected) exposure to covid-19: Secondary | ICD-10-CM | POA: Diagnosis present

## 2019-10-18 DIAGNOSIS — E039 Hypothyroidism, unspecified: Secondary | ICD-10-CM | POA: Diagnosis present

## 2019-10-18 DIAGNOSIS — N1831 Chronic kidney disease, stage 3a: Secondary | ICD-10-CM | POA: Diagnosis present

## 2019-10-18 DIAGNOSIS — N179 Acute kidney failure, unspecified: Secondary | ICD-10-CM | POA: Diagnosis not present

## 2019-10-18 DIAGNOSIS — E440 Moderate protein-calorie malnutrition: Secondary | ICD-10-CM | POA: Diagnosis present

## 2019-10-18 DIAGNOSIS — G4733 Obstructive sleep apnea (adult) (pediatric): Secondary | ICD-10-CM | POA: Diagnosis present

## 2019-10-18 DIAGNOSIS — A419 Sepsis, unspecified organism: Secondary | ICD-10-CM | POA: Diagnosis not present

## 2019-10-18 DIAGNOSIS — J449 Chronic obstructive pulmonary disease, unspecified: Secondary | ICD-10-CM | POA: Diagnosis present

## 2019-10-18 LAB — COMPREHENSIVE METABOLIC PANEL
ALT: 44 U/L (ref 0–44)
AST: 54 U/L — ABNORMAL HIGH (ref 15–41)
Albumin: 2.3 g/dL — ABNORMAL LOW (ref 3.5–5.0)
Alkaline Phosphatase: 94 U/L (ref 38–126)
Anion gap: 5 (ref 5–15)
BUN: 36 mg/dL — ABNORMAL HIGH (ref 6–20)
CO2: 25 mmol/L (ref 22–32)
Calcium: 7.9 mg/dL — ABNORMAL LOW (ref 8.9–10.3)
Chloride: 109 mmol/L (ref 98–111)
Creatinine, Ser: 1.58 mg/dL — ABNORMAL HIGH (ref 0.44–1.00)
GFR, Estimated: 38 mL/min — ABNORMAL LOW (ref >60)
Glucose, Bld: 52 mg/dL — ABNORMAL LOW (ref 70–99)
Potassium: 3.8 mmol/L (ref 3.5–5.1)
Sodium: 139 mmol/L (ref 135–145)
Total Bilirubin: 1.2 mg/dL (ref 0.3–1.2)
Total Protein: 8.3 g/dL — ABNORMAL HIGH (ref 6.5–8.1)

## 2019-10-18 LAB — CBC WITH DIFFERENTIAL/PLATELET
Abs Immature Granulocytes: 0 10*3/uL (ref 0.00–0.07)
Band Neutrophils: 2 %
Basophils Absolute: 0 10*3/uL (ref 0.0–0.1)
Basophils Relative: 0 %
Eosinophils Absolute: 0.1 10*3/uL (ref 0.0–0.5)
Eosinophils Relative: 1 %
HCT: 31.9 % — ABNORMAL LOW (ref 36.0–46.0)
Hemoglobin: 9.8 g/dL — ABNORMAL LOW (ref 12.0–15.0)
Lymphocytes Relative: 35 %
Lymphs Abs: 3.9 10*3/uL (ref 0.7–4.0)
MCH: 29.7 pg (ref 26.0–34.0)
MCHC: 30.7 g/dL (ref 30.0–36.0)
MCV: 96.7 fL (ref 80.0–100.0)
Monocytes Absolute: 0.7 10*3/uL (ref 0.1–1.0)
Monocytes Relative: 6 %
Neutro Abs: 6.5 10*3/uL (ref 1.7–7.7)
Neutrophils Relative %: 56 %
Platelets: 143 10*3/uL — ABNORMAL LOW (ref 150–400)
RBC: 3.3 MIL/uL — ABNORMAL LOW (ref 3.87–5.11)
RDW: 15.3 % (ref 11.5–15.5)
WBC: 11.2 10*3/uL — ABNORMAL HIGH (ref 4.0–10.5)
nRBC: 0.2 % (ref 0.0–0.2)

## 2019-10-18 LAB — CBG MONITORING, ED
Glucose-Capillary: 59 mg/dL — ABNORMAL LOW (ref 70–99)
Glucose-Capillary: 90 mg/dL (ref 70–99)
Glucose-Capillary: 100 mg/dL — ABNORMAL HIGH (ref 70–99)
Glucose-Capillary: 165 mg/dL — ABNORMAL HIGH (ref 70–99)

## 2019-10-18 LAB — IRON AND TIBC
Iron: 64 ug/dL (ref 28–170)
Saturation Ratios: 21 % (ref 10.4–31.8)
TIBC: 302 ug/dL (ref 250–450)
UIBC: 238 ug/dL

## 2019-10-18 LAB — RESPIRATORY PANEL BY RT PCR (FLU A&B, COVID)
Influenza A by PCR: NEGATIVE
Influenza B by PCR: NEGATIVE
SARS Coronavirus 2 by RT PCR: NEGATIVE

## 2019-10-18 LAB — D-DIMER, QUANTITATIVE: D-Dimer, Quant: 6.24 ug/mL-FEU — ABNORMAL HIGH (ref 0.00–0.50)

## 2019-10-18 LAB — BRAIN NATRIURETIC PEPTIDE: B Natriuretic Peptide: 176.2 pg/mL — ABNORMAL HIGH (ref 0.0–100.0)

## 2019-10-18 LAB — HEMOGLOBIN A1C
Hgb A1c MFr Bld: 8.6 % — ABNORMAL HIGH (ref 4.8–5.6)
Mean Plasma Glucose: 200.12 mg/dL

## 2019-10-18 LAB — GLUCOSE, CAPILLARY: Glucose-Capillary: 156 mg/dL — ABNORMAL HIGH (ref 70–99)

## 2019-10-18 LAB — TROPONIN I (HIGH SENSITIVITY)
Troponin I (High Sensitivity): 34 ng/L — ABNORMAL HIGH (ref <18)
Troponin I (High Sensitivity): 33 ng/L — ABNORMAL HIGH (ref <18)

## 2019-10-18 MED ORDER — INSULIN ASPART 100 UNIT/ML ~~LOC~~ SOLN
0.0000 [IU] | Freq: Every day | SUBCUTANEOUS | Status: DC
  Filled 2019-10-18: qty 0.05

## 2019-10-18 MED ORDER — INSULIN ASPART 100 UNIT/ML ~~LOC~~ SOLN
0.0000 [IU] | Freq: Three times a day (TID) | SUBCUTANEOUS | Status: DC
  Administered 2019-10-19: 1 [IU] via SUBCUTANEOUS
  Administered 2019-10-19 (×2): 2 [IU] via SUBCUTANEOUS
  Administered 2019-10-20: 1 [IU] via SUBCUTANEOUS
  Administered 2019-10-20: 2 [IU] via SUBCUTANEOUS
  Administered 2019-10-20 – 2019-10-21 (×4): 1 [IU] via SUBCUTANEOUS
  Administered 2019-10-22: 2 [IU] via SUBCUTANEOUS
  Administered 2019-10-22: 1 [IU] via SUBCUTANEOUS
  Administered 2019-10-22 – 2019-10-24 (×4): 2 [IU] via SUBCUTANEOUS
  Administered 2019-10-24: 1 [IU] via SUBCUTANEOUS
  Filled 2019-10-18: qty 0.09

## 2019-10-18 MED ORDER — FUROSEMIDE 10 MG/ML IJ SOLN
40.0000 mg | Freq: Once | INTRAMUSCULAR | Status: AC
  Administered 2019-10-18: 40 mg via INTRAVENOUS
  Filled 2019-10-18: qty 4

## 2019-10-18 MED ORDER — IPRATROPIUM BROMIDE HFA 17 MCG/ACT IN AERS
2.0000 | INHALATION_SPRAY | Freq: Once | RESPIRATORY_TRACT | Status: AC
  Administered 2019-10-18: 2 via RESPIRATORY_TRACT
  Filled 2019-10-18: qty 12.9

## 2019-10-18 MED ORDER — ISOSORB DINITRATE-HYDRALAZINE 20-37.5 MG PO TABS
1.0000 | ORAL_TABLET | Freq: Three times a day (TID) | ORAL | Status: DC
  Administered 2019-10-19 – 2019-10-21 (×8): 1 via ORAL
  Filled 2019-10-18 (×10): qty 1

## 2019-10-18 MED ORDER — ALBUTEROL SULFATE HFA 108 (90 BASE) MCG/ACT IN AERS
6.0000 | INHALATION_SPRAY | Freq: Once | RESPIRATORY_TRACT | Status: AC
  Administered 2019-10-18: 6 via RESPIRATORY_TRACT
  Filled 2019-10-18: qty 6.7

## 2019-10-18 MED ORDER — TECHNETIUM TO 99M ALBUMIN AGGREGATED: 4.0000 | Freq: Once | INTRAVENOUS | Status: DC | PRN

## 2019-10-18 MED ORDER — FUROSEMIDE 10 MG/ML IJ SOLN
40.0000 mg | Freq: Every day | INTRAMUSCULAR | Status: DC
  Administered 2019-10-19: 40 mg via INTRAVENOUS
  Filled 2019-10-18: qty 4

## 2019-10-18 MED ORDER — AEROCHAMBER Z-STAT PLUS/MEDIUM MISC
1.0000 | Freq: Once | Status: AC
  Administered 2019-10-18: 1
  Filled 2019-10-18: qty 2

## 2019-10-18 MED ORDER — METOPROLOL TARTRATE 5 MG/5ML IV SOLN
5.0000 mg | Freq: Four times a day (QID) | INTRAVENOUS | Status: DC
  Filled 2019-10-18: qty 5

## 2019-10-18 MED ORDER — DEXTROSE 10 % IV SOLN: INTRAVENOUS | Status: DC

## 2019-10-18 MED ORDER — DEXTROSE 50 % IV SOLN
0.5000 | Freq: Once | INTRAVENOUS | Status: AC
  Administered 2019-10-18: 25 mL via INTRAVENOUS
  Filled 2019-10-18: qty 50

## 2019-10-18 MED ORDER — ACETAMINOPHEN 325 MG PO TABS
650.0000 mg | ORAL_TABLET | Freq: Four times a day (QID) | ORAL | Status: DC | PRN
  Administered 2019-10-19 – 2019-10-23 (×4): 650 mg via ORAL
  Filled 2019-10-18 (×4): qty 2

## 2019-10-18 MED ORDER — ACETAMINOPHEN 650 MG RE SUPP: 650.0000 mg | Freq: Four times a day (QID) | RECTAL | Status: DC | PRN

## 2019-10-18 MED ORDER — ENOXAPARIN SODIUM 80 MG/0.8ML ~~LOC~~ SOLN
80.0000 mg | SUBCUTANEOUS | Status: DC
  Administered 2019-10-18 – 2019-10-23 (×6): 80 mg via SUBCUTANEOUS
  Filled 2019-10-18 (×7): qty 0.8

## 2019-10-18 NOTE — H&P (Signed)
History and Physical    Sheila Gibson SJG:[id number] DOB: [birth year] DOA: 10/18/2019  PCP: Patient, No Pcp Per  Patient coming from: Home  Chief Complaint: dyspnea  HPI: Sheila Gibson is a 49 y.o. female with medical history significant of HTN, DM2, morbid obesity. Presenting with 2 weeks of dyspnea and cough. She first noticed this when she was walking up the steps and got out of breath. No chest pain, just winded. Family had to assist her to her apartment. She was able to calm down some, but her breathing never quite returned to normal. She has noticed since that time her breathing has been on the decline. She has also noticed swelling in her legs over that time. She went to the ED about a week ago for these symptoms. She says that she was given lasix and breathing treatments. She was told that there was nothing else to do. Since then, everything has continued to worsen. She became concerned again and came to the ED. She reports no other aggravating or alleviating factors. She reports compliance with her lasix but this has not improved her situation.   ED Course: CXR was obtained. It showed no acute process. She was given 40mg  lasix IV. Trp were slightly elevated but flat. She was found to be hypoglycemic. TRH was called for admission.   Review of Systems:  Denies CP, palpitations, N, V, ab pain, D, fever, syncopal episodes. Reports decreased appetite. Review of systems is otherwise negative for all not mentioned in HPI.   PMHx Past Medical History:  Diagnosis Date  . COPD (chronic obstructive pulmonary disease) (HCC)   . Depression   . Diabetes mellitus without complication (HCC)   . Heart murmur   . Hepatic cirrhosis (HCC)   . Hypertension   . Obesity   . Thyroid disease     PSHx Past Surgical History:  Procedure Laterality Date  . CESAREAN SECTION    . ESOPHAGOGASTRODUODENOSCOPY (EGD) WITH PROPOFOL N/A 09/01/2017   Procedure: ESOPHAGOGASTRODUODENOSCOPY (EGD)  WITH PROPOFOL;  Surgeon: 09/03/2017, MD;  Location: [facility];  Service: Gastroenterology;  Laterality: N/A;  . THYROIDECTOMY      SocHx  reports that she has never smoked. She has never used smokeless tobacco. She reports previous alcohol use. She reports that she does not use drugs.  No Known Allergies  FamHx Family History  Problem Relation Age of Onset  . Cancer Mother   . Hypertension Other     Prior to Admission medications   Medication Sig Start Date End Date Taking? Authorizing Provider  tiZANidine (ZANAFLEX) 4 MG tablet Take 4 mg by mouth 3 (three) times daily as needed for muscle spasms.    Yes [provider]  zolpidem (AMBIEN) 10 MG tablet Take 10 mg by mouth at bedtime as needed for sleep.  01/23/17  Yes [provider]  acetaminophen (TYLENOL) 500 MG tablet Take 500 mg by mouth daily as needed for moderate pain.    [provider]  albuterol (PROVENTIL HFA;VENTOLIN HFA) 108 (90 BASE) MCG/ACT inhaler Inhale 1-2 puffs into the lungs every 6 (six) hours as needed for wheezing or shortness of breath.    [provider]  ferrous sulfate 325 (65 FE) MG tablet Take 1 tablet (325 mg total) by mouth 2 (two) times daily with a meal. 05/12/19   Amin, 07/12/19, MD  furosemide (LASIX) 40 MG tablet Take 1 tablet (40 mg total) by mouth daily. 05/12/19 05/11/20  07/11/20, MD  gabapentin (NEURONTIN) 800 MG tablet Take 800 mg by mouth 3 (three) times daily.     [provider]  hydrOXYzine (ATARAX/VISTARIL) 25 MG tablet Take 25 mg by mouth 3 (three) times daily as needed. for anxiety 08/05/17   [provider]  insulin glargine (LANTUS) 100 unit/mL SOPN Inject 60 Units into the skin 2 (two) times daily.     [provider]  ipratropium-albuterol (DUONEB) 0.5-2.5 (3) MG/3ML SOLN Take 3 mLs by nebulization every 6 (six) hours as needed. 08/03/19   Wieters, Hallie C, PA-C  levothyroxine (SYNTHROID) 50 MCG tablet Take 1  tablet (50 mcg total) by mouth daily before breakfast. 05/12/19   Amin, Loura HaltAnkit Chirag, MD  losartan (COZAAR) 100 MG tablet Take 1 tablet (100 mg total) by mouth daily. 01/18/18   Wallis BambergMani, Mario, PA-C  polyethylene glycol (MIRALAX / GLYCOLAX) 17 g packet Take 17 g by mouth daily as needed for moderate constipation or severe constipation. 05/12/19   Amin, Loura HaltAnkit Chirag, MD  potassium chloride (KLOR-CON) 10 MEQ tablet Take 1 tablet (10 mEq total) by mouth daily. 05/12/19   Amin, Loura HaltAnkit Chirag, MD  senna-docusate (SENOKOT-S) 8.6-50 MG tablet Take 2 tablets by mouth at bedtime as needed for mild constipation or moderate constipation. 05/12/19   Amin, Loura HaltAnkit Chirag, MD  tenofovir (VIREAD) 300 MG tablet Take 300 mg by mouth daily. 08/03/17   [provider]    Physical Exam: Vitals:   10/18/19 0600 10/18/19 0630 10/18/19 0730 10/18/19 0815  BP: (!) 185/106 (!) 160/88 (!) 169/82   Pulse: 85 88 78   Resp: 20 (!) 28 17   TempSrc:      SpO2: 90% 98%  98%  Weight:      Height:        General: 49 y.o. female resting in bed in NAD Eyes: PERRL, normal sclera ENMT: Nares patent w/o discharge, orophaynx clear, dentition normal, ears w/o discharge/lesions/ulcers Neck: Supple, trachea midline Cardiovascular: RRR, +S1, S2, no g/r, 2/6 SEM, equal pulses throughout Respiratory: decreased at bases, no w/r/r, normal WOB on 2L [place] GI: BS+, obese, NDNT, no masses noted, no organomegaly noted MSK: No c/c; 1+ BLE edema Skin: No rashes, bruises, ulcerations noted Neuro: A&O x 3, no focal deficits Psyc: somewhat drowsy and affect, calm/cooperative  Labs on Admission: I have personally reviewed following labs and imaging studies  CBC: Recent Labs  Lab 10/12/19 0350 10/18/19 0252  WBC 6.9 11.2*  NEUTROABS 3.5 6.5  HGB 9.4* 9.8*  HCT 30.5* 31.9*  MCV 96.8 96.7  PLT 109* 143*   Basic Metabolic Panel: Recent Labs  Lab 10/12/19 0350 10/18/19 0252  NA 140 139  K 4.0 3.8  CL 108 109  CO2 26 25  GLUCOSE 206*  52*  BUN 19 36*  CREATININE 1.26* 1.58*  CALCIUM 8.0* 7.9*   GFR: Estimated Creatinine Clearance: 69.1 mL/min (A) (by C-G formula based on SCr of 1.58 mg/dL (H)). Liver Function Tests: Recent Labs  Lab 10/18/19 0252  AST 54*  ALT 44  ALKPHOS 94  BILITOT 1.2  PROT 8.3*  ALBUMIN 2.3*   No results for input(s): LIPASE, AMYLASE in the last 168 hours. No results for input(s): AMMONIA in the last 168 hours. Coagulation Profile: No results for input(s): INR, PROTIME in the last 168 hours. Cardiac Enzymes: No results for input(s): CKTOTAL, CKMB, CKMBINDEX, TROPONINI in the last 168 hours. BNP (last 3 results) No results for input(s): PROBNP in the last 8760 hours. HbA1C: No results for input(s):  HGBA1C in the last 72 hours. CBG: Recent Labs  Lab 10/18/19 0435 10/18/19 0508 10/18/19 0550  GLUCAP 59* 90 100*   Lipid Profile: No results for input(s): CHOL, HDL, LDLCALC, TRIG, CHOLHDL, LDLDIRECT in the last 72 hours. Thyroid Function Tests: No results for input(s): TSH, T4TOTAL, FREET4, T3FREE, THYROIDAB in the last 72 hours. Anemia Panel: No results for input(s): VITAMINB12, FOLATE, FERRITIN, TIBC, IRON, RETICCTPCT in the last 72 hours. Urine analysis:    Component Value Date/Time   COLORURINE AMBER (A) 09/10/2017 2045   APPEARANCEUR CLEAR 09/10/2017 2045   LABSPEC 1.015 03/31/2018 1419   PHURINE 6.5 03/31/2018 1419   GLUCOSEU NEGATIVE 03/31/2018 1419   HGBUR TRACE (A) 03/31/2018 1419   BILIRUBINUR NEGATIVE 03/31/2018 1419   BILIRUBINUR small 07/21/2017 1601   KETONESUR NEGATIVE 03/31/2018 1419   PROTEINUR 30 (A) 03/31/2018 1419   UROBILINOGEN 0.2 03/31/2018 1419   NITRITE NEGATIVE 03/31/2018 1419   LEUKOCYTESUR MODERATE (A) 03/31/2018 1419    Radiological Exams on Admission: DG Chest 2 View  Result Date: 10/18/2019 CLINICAL DATA:  Shortness of breath EXAM: CHEST - 2 VIEW COMPARISON:  10/12/2019 FINDINGS: The heart size and mediastinal contours are within normal  limits. Both lungs are clear. The visualized skeletal structures are unremarkable. IMPRESSION: No active cardiopulmonary disease. Electronically Signed   By: Deatra Robinson M.D.   On: 10/18/2019 01:21   Assessment/Plan Dyspnea Acute on chronic HFpEF     - admit to obs, telemetry     - received IV lasix 40mg  x 1 in ED     - CXR w/o acute process, BNP is 176     - trp mildly elevated (34 -> 33), denies CP, EKG w/o st changes     - COVID negative     - d-dimer is elevated; checking VQ scan (no CTA PE d/t renal function)     - check echo     - continue lasix for now  Hypoglycemia DM2 DM neuropathy     - hold home lantus for right now     - SSI, DM2 diet, glucose checks     - she received amp 50, and is getting D10 IVF     - got her regular home dosing of lantus yesterday even though she was not eating well     - check A1c     - continue gabapentin  CKD 3a     - she is a little off baseline     - she is receiving some fluids, follow     - got lasix in ED, but no UOP recorded and none seen in purewick; check renal  Morbid obesity     - counsel lifestyle changes  HTN     - holding home losartan d/t increased Scr     - will schedule bidil  Normocytic anemia Thrombocytopenia     - no evidence of bleed     - check iron  DVT prophylaxis: lovenox  Code Status: Full  Family Communication: None at beside  Consults called: None  Admission status: Observation  Status is: Observation  The patient remains OBS appropriate and will d/c before 2 midnights.  Dispo: The patient is from: Home              Anticipated d/c is to: Home              Anticipated d/c date is: 1 day  Patient currently is not medically stable to d/c.  Time spent coordinating admission: 55 minutes  Sharlynn Seckinger A Simi Briel DO [facility]  If 7PM-7AM, please contact night-coverage [email or web address]  10/18/2019, 8:40 AM

## 2019-10-18 NOTE — ED Triage Notes (Signed)
Patient states that she is still having sob and trouble walking from the last time that she was here. Patient states that it has not gotten any better. Patient states she has a dry cough also.

## 2019-10-18 NOTE — ED Provider Notes (Signed)
[facility]-EMERGENCY DEPT Provider Note  CSN: [id number] Arrival date & time: 10/18/19 0031  Chief Complaint(s) Shortness of Breath  HPI Sheila Gibson is a 49 y.o. female with a past medical history listed below including hypertension, diabetes, COPD not on oxygen, OSA, diastolic heart failure on Lasix, liver cirrhosis who presents to the emergency department for continued shortness of breath worse with exertion.  Patient was seen last week for the same and it was attributed to acute on chronic CHF exacerbation.  Patient was diuresed and felt stable for discharge.  She reports that she has been taking her Lasix, doubled as recommended with minimal relief.  She reports that she is having worsening abdominal distention.  No change in lower extremity edema.  No associated chest pain.  She does have mild cough.  She also reports using her home inhalers with minimal relief.  No other physical complaints.  HPI  Past Medical History Past Medical History:  Diagnosis Date  . COPD (chronic obstructive pulmonary disease) (HCC)   . Depression   . Diabetes mellitus without complication (HCC)   . Heart murmur   . Hepatic cirrhosis (HCC)   . Hypertension   . Obesity   . Thyroid disease    Patient Active Problem List   Diagnosis Date Noted  . CHF (congestive heart failure) (HCC) 05/10/2019  . Anemia 05/10/2019  . Hyperglycemia 05/10/2019  . Type 2 diabetes mellitus (HCC) 05/10/2019  . Elevated troponin 05/10/2019  . Warts, genital 08/20/2017  . Hypertensive urgency 08/17/2017   Home Medication(s) Prior to Admission medications   Medication Sig Start Date End Date Taking? Authorizing Provider  tiZANidine (ZANAFLEX) 4 MG tablet Take 4 mg by mouth 3 (three) times daily as needed for muscle spasms.    Yes [provider]  zolpidem (AMBIEN) 10 MG tablet Take 10 mg by mouth at bedtime as needed for sleep.  01/23/17  Yes [provider]  acetaminophen  (TYLENOL) 500 MG tablet Take 500 mg by mouth daily as needed for moderate pain.    [provider]  albuterol (PROVENTIL HFA;VENTOLIN HFA) 108 (90 BASE) MCG/ACT inhaler Inhale 1-2 puffs into the lungs every 6 (six) hours as needed for wheezing or shortness of breath.    [provider]  ferrous sulfate 325 (65 FE) MG tablet Take 1 tablet (325 mg total) by mouth 2 (two) times daily with a meal. 05/12/19   Amin, Loura Halt, MD  furosemide (LASIX) 40 MG tablet Take 1 tablet (40 mg total) by mouth daily. 05/12/19 05/11/20  Amin, Loura Halt, MD  gabapentin (NEURONTIN) 800 MG tablet Take 800 mg by mouth 3 (three) times daily.     [provider]  hydrOXYzine (ATARAX/VISTARIL) 25 MG tablet Take 25 mg by mouth 3 (three) times daily as needed. for anxiety 08/05/17   [provider]  insulin glargine (LANTUS) 100 unit/mL SOPN Inject 60 Units into the skin 2 (two) times daily.     [provider]  ipratropium-albuterol (DUONEB) 0.5-2.5 (3) MG/3ML SOLN Take 3 mLs by nebulization every 6 (six) hours as needed. 08/03/19   Wieters, Hallie C, PA-C  levothyroxine (SYNTHROID) 50 MCG tablet Take 1 tablet (50 mcg total) by mouth daily before breakfast. 05/12/19   Amin, Loura Halt, MD  losartan (COZAAR) 100 MG tablet Take 1 tablet (100 mg total) by mouth daily. 01/18/18   Wallis Bamberg, PA-C  polyethylene glycol (MIRALAX / GLYCOLAX) 17 g packet Take 17 g by mouth daily as  needed for moderate constipation or severe constipation. 05/12/19   Amin, Loura Halt, MD  potassium chloride (KLOR-CON) 10 MEQ tablet Take 1 tablet (10 mEq total) by mouth daily. 05/12/19   Amin, Loura Halt, MD  senna-docusate (SENOKOT-S) 8.6-50 MG tablet Take 2 tablets by mouth at bedtime as needed for mild constipation or moderate constipation. 05/12/19   Amin, Loura Halt, MD  tenofovir (VIREAD) 300 MG tablet Take 300 mg by mouth daily. 08/03/17   [provider]                                                                                                                                     Past Surgical History Past Surgical History:  Procedure Laterality Date  . CESAREAN SECTION    . ESOPHAGOGASTRODUODENOSCOPY (EGD) WITH PROPOFOL N/A 09/01/2017   Procedure: ESOPHAGOGASTRODUODENOSCOPY (EGD) WITH PROPOFOL;  Surgeon: Kathi Der, MD;  Location: [facility];  Service: Gastroenterology;  Laterality: N/A;  . THYROIDECTOMY     Family History Family History  Problem Relation Age of Onset  . Cancer Mother   . Hypertension Other     Social History Social History   Tobacco Use  . Smoking status: Never Smoker  . Smokeless tobacco: Never Used  Vaping Use  . Vaping Use: Never used  Substance Use Topics  . Alcohol use: Not Currently  . Drug use: No   Allergies Patient has no known allergies.  Review of Systems Review of Systems All other systems are reviewed and are negative for acute change except as noted in the HPI  Physical Exam Vital Signs  I have reviewed the triage vital signs BP (!) 173/87   Pulse 78   Resp 19   Ht 5\' 7"  (1.702 m)   Wt (!) 158.8 kg   SpO2 93%   BMI 54.82 kg/m   Physical Exam Vitals reviewed.  Constitutional:      General: She is not in acute distress.    Appearance: She is well-developed. She is morbidly obese. She is not diaphoretic.  HENT:     Head: Normocephalic and atraumatic.     Nose: Nose normal.  Eyes:     General: No scleral icterus.       Right eye: No discharge.        Left eye: No discharge.     Conjunctiva/sclera: Conjunctivae normal.     Pupils: Pupils are equal, round, and reactive to light.  Cardiovascular:     Rate and Rhythm: Normal rate and regular rhythm.     Heart sounds: No murmur heard.  No friction rub. No gallop.   Pulmonary:     Effort: Pulmonary effort is normal. No respiratory distress.     Breath sounds: Normal breath sounds. No stridor. No rales.  Abdominal:     General: There is no distension.      Palpations: Abdomen is soft.     Tenderness: There is  no abdominal tenderness.  Musculoskeletal:        General: No tenderness.     Cervical back: Normal range of motion and neck supple.     Right lower leg: 1+ Pitting Edema present.     Left lower leg: 1+ Pitting Edema present.  Skin:    General: Skin is warm and dry.     Findings: No erythema or rash.  Neurological:     Mental Status: She is oriented to person, place, and time. She is lethargic.     ED Results and Treatments Labs (all labs ordered are listed, but only abnormal results are displayed) Labs Reviewed  CBC WITH DIFFERENTIAL/PLATELET - Abnormal; Notable for the following components:      Result Value   WBC 11.2 (*)    RBC 3.30 (*)    Hemoglobin 9.8 (*)    HCT 31.9 (*)    Platelets 143 (*)    All other components within normal limits  COMPREHENSIVE METABOLIC PANEL - Abnormal; Notable for the following components:   Glucose, Bld 52 (*)    BUN 36 (*)    Creatinine, Ser 1.58 (*)    Calcium 7.9 (*)    Total Protein 8.3 (*)    Albumin 2.3 (*)    AST 54 (*)    GFR, Estimated 38 (*)    All other components within normal limits  BRAIN NATRIURETIC PEPTIDE - Abnormal; Notable for the following components:   B Natriuretic Peptide 176.2 (*)    All other components within normal limits  CBG MONITORING, ED - Abnormal; Notable for the following components:   Glucose-Capillary 59 (*)    All other components within normal limits  TROPONIN I (HIGH SENSITIVITY) - Abnormal; Notable for the following components:   Troponin I (High Sensitivity) 34 (*)    All other components within normal limits  RESPIRATORY PANEL BY RT PCR (FLU A&B, COVID)  CBG MONITORING, ED  CBG MONITORING, ED  TROPONIN I (HIGH SENSITIVITY)                                                                                                                         EKG  EKG Interpretation  Date/Time:    Ventricular Rate:    PR Interval:    QRS Duration:     QT Interval:    QTC Calculation:   R Axis:     Text Interpretation:        Radiology DG Chest 2 View  Result Date: 10/18/2019 CLINICAL DATA:  Shortness of breath EXAM: CHEST - 2 VIEW COMPARISON:  10/12/2019 FINDINGS: The heart size and mediastinal contours are within normal limits. Both lungs are clear. The visualized skeletal structures are unremarkable. IMPRESSION: No active cardiopulmonary disease. Electronically Signed   By: Deatra RobinsonKevin  Herman M.D.   On: 10/18/2019 01:21    Pertinent labs & imaging results that were available during my care of the patient were reviewed by me and considered in my medical decision  making (see chart for details).  Medications Ordered in ED Medications  dextrose 10 % infusion ( Intravenous New Bag/Given 10/18/19 0455)  albuterol (VENTOLIN HFA) 108 (90 Base) MCG/ACT inhaler 6 puff (6 puffs Inhalation Given 10/18/19 0313)  ipratropium (ATROVENT HFA) inhaler 2 puff (2 puffs Inhalation Given 10/18/19 0313)  aerochamber Z-Stat Plus/medium 1 each (1 each Other Given 10/18/19 0312)  furosemide (LASIX) injection 40 mg (40 mg Intravenous Given 10/18/19 0455)  dextrose 50 % solution 25 mL (25 mLs Intravenous Given 10/18/19 0455)                                                                                                                                    Procedures Procedures  (including critical care time)  Medical Decision Making / ED Course I have reviewed the nursing notes for this encounter and the patient's prior records (if available in EHR or on provided paperwork).   Sheila Gibson was evaluated in Emergency Department on 10/18/2019 for the symptoms described in the history of present illness. She was evaluated in the context of the global COVID-19 pandemic, which necessitated consideration that the patient might be at risk for infection with the SARS-CoV-2 virus that causes COVID-19. Institutional protocols and algorithms that pertain to the  evaluation of patients at risk for COVID-19 are in a state of rapid change based on information released by regulatory bodies including the CDC and federal and state organizations. These policies and algorithms were followed during the patient's care in the ED.  Shortness of breath with dyspnea on exertion and reported increased abdominal distention. COPD versus CHF versus increased ascites.  Chest x-ray without evidence of pulmonary edema or pneumonia.  No pneumothorax.  Patient noted to be a bit lethargic on exam.  Lab notable for hypoglycemia.  Patient was given juice and crackers with little improvement.  Patient reports that she took her nighttime Lantus as prescribed. Denies taking extra doses of her insulin.  States that she has been eating well.  No vomiting or diarrhea.  Patient given half an amp of D50 and placed on D10 infusion.  Given her long acting insulin, she will require admission for continued monitoring and management.  With regards to the patient's shortness of breath, patient's BNP is close to her baseline.  Her troponin is slightly elevated.  She is not having any active chest pain and her EKG does not show any evidence of acute ischemic change from for ACS.  Patient does have a mild AKI.  We will discuss the case with the hospitalist team.  Recommend continuing management.  Will likely need to be assessed for the volume of ascites and see if therapeutic paracentesis may help.      Final Clinical Impression(s) / ED Diagnoses Final diagnoses:  Hypoglycemia  Shortness of breath      This chart was dictated using voice recognition software.  Despite best efforts to proofread,  errors can occur which can change the documentation meaning.   Nira Conn, MD 10/18/19 [phone]

## 2019-10-18 NOTE — ED Notes (Signed)
After juice and crackers, patient's BG rechecked, 59. MD at bedside and aware of reading. Verbal order given to administer 1/2 amp of D50 via IV.

## 2019-10-18 NOTE — ED Notes (Signed)
BG noted to be 52, MD made aware.  Per MD request, juice and crackers provided to patient. Patient aware of need to eat/drink to increase blood sugar. Will recheck CBG after patient has eaten.

## 2019-10-18 NOTE — Progress Notes (Signed)
Bilateral lower extremity venous duplex has been completed. Preliminary results can be found in CV Proc through chart review.   10/18/19 11:33 AM Olen Cordial RVT

## 2019-10-18 NOTE — ED Notes (Signed)
Bedside commode emptied of urine and stool, pt resting comfortably in bed

## 2019-10-18 NOTE — Plan of Care (Signed)

## 2019-10-19 ENCOUNTER — Observation Stay (HOSPITAL_COMMUNITY): Payer: Medicaid Other

## 2019-10-19 ENCOUNTER — Inpatient Hospital Stay (HOSPITAL_COMMUNITY): Payer: Medicaid Other

## 2019-10-19 DIAGNOSIS — I130 Hypertensive heart and chronic kidney disease with heart failure and stage 1 through stage 4 chronic kidney disease, or unspecified chronic kidney disease: Secondary | ICD-10-CM | POA: Diagnosis present

## 2019-10-19 DIAGNOSIS — J180 Bronchopneumonia, unspecified organism: Secondary | ICD-10-CM | POA: Diagnosis not present

## 2019-10-19 DIAGNOSIS — E1140 Type 2 diabetes mellitus with diabetic neuropathy, unspecified: Secondary | ICD-10-CM | POA: Diagnosis present

## 2019-10-19 DIAGNOSIS — E440 Moderate protein-calorie malnutrition: Secondary | ICD-10-CM | POA: Diagnosis present

## 2019-10-19 DIAGNOSIS — E1122 Type 2 diabetes mellitus with diabetic chronic kidney disease: Secondary | ICD-10-CM | POA: Diagnosis present

## 2019-10-19 DIAGNOSIS — J449 Chronic obstructive pulmonary disease, unspecified: Secondary | ICD-10-CM | POA: Diagnosis present

## 2019-10-19 DIAGNOSIS — J9601 Acute respiratory failure with hypoxia: Secondary | ICD-10-CM | POA: Diagnosis present

## 2019-10-19 DIAGNOSIS — I5031 Acute diastolic (congestive) heart failure: Secondary | ICD-10-CM | POA: Diagnosis not present

## 2019-10-19 DIAGNOSIS — R0602 Shortness of breath: Secondary | ICD-10-CM

## 2019-10-19 DIAGNOSIS — N179 Acute kidney failure, unspecified: Secondary | ICD-10-CM

## 2019-10-19 DIAGNOSIS — N1831 Chronic kidney disease, stage 3a: Secondary | ICD-10-CM | POA: Diagnosis present

## 2019-10-19 DIAGNOSIS — K529 Noninfective gastroenteritis and colitis, unspecified: Secondary | ICD-10-CM | POA: Diagnosis not present

## 2019-10-19 DIAGNOSIS — I5033 Acute on chronic diastolic (congestive) heart failure: Secondary | ICD-10-CM | POA: Diagnosis present

## 2019-10-19 DIAGNOSIS — G4733 Obstructive sleep apnea (adult) (pediatric): Secondary | ICD-10-CM | POA: Diagnosis present

## 2019-10-19 DIAGNOSIS — I509 Heart failure, unspecified: Secondary | ICD-10-CM

## 2019-10-19 DIAGNOSIS — K7460 Unspecified cirrhosis of liver: Secondary | ICD-10-CM | POA: Diagnosis present

## 2019-10-19 DIAGNOSIS — E079 Disorder of thyroid, unspecified: Secondary | ICD-10-CM | POA: Diagnosis present

## 2019-10-19 DIAGNOSIS — Z6841 Body Mass Index (BMI) 40.0 and over, adult: Secondary | ICD-10-CM | POA: Diagnosis not present

## 2019-10-19 DIAGNOSIS — Z8249 Family history of ischemic heart disease and other diseases of the circulatory system: Secondary | ICD-10-CM | POA: Diagnosis not present

## 2019-10-19 DIAGNOSIS — D649 Anemia, unspecified: Secondary | ICD-10-CM | POA: Diagnosis present

## 2019-10-19 DIAGNOSIS — Z20822 Contact with and (suspected) exposure to covid-19: Secondary | ICD-10-CM | POA: Diagnosis present

## 2019-10-19 DIAGNOSIS — E039 Hypothyroidism, unspecified: Secondary | ICD-10-CM | POA: Diagnosis present

## 2019-10-19 DIAGNOSIS — E11649 Type 2 diabetes mellitus with hypoglycemia without coma: Secondary | ICD-10-CM | POA: Diagnosis present

## 2019-10-19 DIAGNOSIS — E162 Hypoglycemia, unspecified: Secondary | ICD-10-CM | POA: Diagnosis not present

## 2019-10-19 DIAGNOSIS — E662 Morbid (severe) obesity with alveolar hypoventilation: Secondary | ICD-10-CM | POA: Diagnosis present

## 2019-10-19 DIAGNOSIS — D696 Thrombocytopenia, unspecified: Secondary | ICD-10-CM | POA: Diagnosis present

## 2019-10-19 DIAGNOSIS — A419 Sepsis, unspecified organism: Secondary | ICD-10-CM | POA: Diagnosis not present

## 2019-10-19 LAB — COMPREHENSIVE METABOLIC PANEL
ALT: 38 U/L (ref 0–44)
AST: 40 U/L (ref 15–41)
Albumin: 2.1 g/dL — ABNORMAL LOW (ref 3.5–5.0)
Alkaline Phosphatase: 87 U/L (ref 38–126)
Anion gap: 5 (ref 5–15)
BUN: 28 mg/dL — ABNORMAL HIGH (ref 6–20)
CO2: 26 mmol/L (ref 22–32)
Calcium: 7.7 mg/dL — ABNORMAL LOW (ref 8.9–10.3)
Chloride: 106 mmol/L (ref 98–111)
Creatinine, Ser: 1.29 mg/dL — ABNORMAL HIGH (ref 0.44–1.00)
GFR, Estimated: 49 mL/min — ABNORMAL LOW (ref >60)
Glucose, Bld: 165 mg/dL — ABNORMAL HIGH (ref 70–99)
Potassium: 4.1 mmol/L (ref 3.5–5.1)
Sodium: 137 mmol/L (ref 135–145)
Total Bilirubin: 0.9 mg/dL (ref 0.3–1.2)
Total Protein: 7.4 g/dL (ref 6.5–8.1)

## 2019-10-19 LAB — CBC
HCT: 29.6 % — ABNORMAL LOW (ref 36.0–46.0)
Hemoglobin: 9.2 g/dL — ABNORMAL LOW (ref 12.0–15.0)
MCH: 30 pg (ref 26.0–34.0)
MCHC: 31.1 g/dL (ref 30.0–36.0)
MCV: 96.4 fL (ref 80.0–100.0)
Platelets: 141 10*3/uL — ABNORMAL LOW (ref 150–400)
RBC: 3.07 MIL/uL — ABNORMAL LOW (ref 3.87–5.11)
RDW: 15.1 % (ref 11.5–15.5)
WBC: 9 10*3/uL (ref 4.0–10.5)
nRBC: 0 % (ref 0.0–0.2)

## 2019-10-19 LAB — ECHOCARDIOGRAM COMPLETE
AR max vel: 1.88 cm2
AV Area VTI: 1.78 cm2
AV Area mean vel: 1.81 cm2
AV Mean grad: 23 mmHg
AV Peak grad: 36 mmHg
Ao pk vel: 3 m/s
Area-P 1/2: 3.37 cm2
Height: 67 in
S' Lateral: 2.7 cm
Weight: 5988.8 oz

## 2019-10-19 LAB — GLUCOSE, CAPILLARY
Glucose-Capillary: 134 mg/dL — ABNORMAL HIGH (ref 70–99)
Glucose-Capillary: 195 mg/dL — ABNORMAL HIGH (ref 70–99)
Glucose-Capillary: 162 mg/dL — ABNORMAL HIGH (ref 70–99)
Glucose-Capillary: 148 mg/dL — ABNORMAL HIGH (ref 70–99)

## 2019-10-19 MED ORDER — FUROSEMIDE 10 MG/ML IJ SOLN
40.0000 mg | Freq: Two times a day (BID) | INTRAMUSCULAR | Status: DC
  Administered 2019-10-19 – 2019-10-21 (×4): 40 mg via INTRAVENOUS
  Filled 2019-10-19 (×4): qty 4

## 2019-10-19 MED ORDER — DIVALPROEX SODIUM 250 MG PO DR TAB
500.0000 mg | DELAYED_RELEASE_TABLET | Freq: Every day | ORAL | Status: DC
  Administered 2019-10-19 – 2019-10-24 (×6): 500 mg via ORAL
  Filled 2019-10-19 (×6): qty 2

## 2019-10-19 MED ORDER — TIZANIDINE HCL 4 MG PO TABS
4.0000 mg | ORAL_TABLET | Freq: Three times a day (TID) | ORAL | Status: DC | PRN
  Administered 2019-10-19 – 2019-10-23 (×3): 4 mg via ORAL
  Filled 2019-10-19 (×4): qty 1

## 2019-10-19 MED ORDER — LEVOTHYROXINE SODIUM 50 MCG PO TABS
50.0000 ug | ORAL_TABLET | Freq: Every day | ORAL | Status: DC
  Administered 2019-10-20 – 2019-10-24 (×5): 50 ug via ORAL
  Filled 2019-10-19 (×5): qty 1

## 2019-10-19 MED ORDER — HYDROXYZINE HCL 25 MG PO TABS
25.0000 mg | ORAL_TABLET | Freq: Three times a day (TID) | ORAL | Status: DC | PRN
  Administered 2019-10-19 – 2019-10-22 (×2): 25 mg via ORAL
  Filled 2019-10-19 (×3): qty 1

## 2019-10-19 MED ORDER — IPRATROPIUM-ALBUTEROL 0.5-2.5 (3) MG/3ML IN SOLN: 3.0000 mL | Freq: Four times a day (QID) | RESPIRATORY_TRACT | Status: DC | PRN

## 2019-10-19 MED ORDER — PERFLUTREN LIPID MICROSPHERE
1.0000 mL | INTRAVENOUS | Status: AC | PRN
  Administered 2019-10-19: 3 mL via INTRAVENOUS
  Filled 2019-10-19: qty 10

## 2019-10-19 MED ORDER — GABAPENTIN 400 MG PO CAPS
800.0000 mg | ORAL_CAPSULE | Freq: Three times a day (TID) | ORAL | Status: DC
  Administered 2019-10-19 – 2019-10-21 (×6): 800 mg via ORAL
  Filled 2019-10-19 (×6): qty 2

## 2019-10-19 NOTE — Progress Notes (Signed)
Echocardiogram 2D Echocardiogram has been performed.  Warren Lacy Leahanna Buser 10/19/2019, 9:35 AM

## 2019-10-19 NOTE — Progress Notes (Signed)
RT attempted BIPAP QHS with pt but pt ripped mask off and stated she couldn't wear it tonight.  Machine remains at bedside in case pt decides to wear.

## 2019-10-19 NOTE — Progress Notes (Signed)
PROGRESS NOTE  Sheila Gibson ZOX:[id number] DOB: 07/17/1970 DOA: 10/18/2019 PCP: Patient, No Pcp Per   LOS: 0 days   Brief Narrative / Interim history: 49 year old female with history of morbid obesity, hypertension, chronic diastolic CHF, DM 2, came into the hospital with several weeks of dyspnea and cough.  Patient has noted lower extremity swelling as well as increased abdominal swelling and discomfort, bloating.  She says that she has gained about 25 pounds since last time being in the hospital several months ago.  She was seen in the ER about a week ago, was given Lasix and breathing treatments and sent home, however came back to the hospital because of her breathing.  She was hypoxic in the ED requiring 3 L supplemental oxygen.  Subjective / 24h Interval events: She is feeling slightly better this morning however reports ongoing significant dyspnea with minimal activities.  Denies any chest pain.  Still appreciates swelling in her abdomen and legs  Assessment & Plan: Principal Problem Acute hypoxic respiratory failure due to acute on chronic diastolic CHF, underlying OHS/OSA -She was on 3 L up until this morning and she was able to be taken to room air.  Has not been up and ambulating, remains fluid overloaded, continue IV diuresis and I will increase her Lasix to twice daily. -2D echo has been ordered and is pending  Active Problems Type 2 diabetes mellitus  -poorly controlled with an A1c of 8.6 -Continue sliding scale, patient had an episode of hypoglycemia yesterday with CBGs in the 50s and her Lantus is now on hold.  Continue to monitor.  OHS/OSA -Recommended nightly BiPAP, continue while here  Morbid obesity -Based on BMI 58, she would benefit from significant weight loss  Hypothyroidism -Resume home Synthroid  Chronic kidney disease stage IIIa -Baseline creatinine 1.2-1.3 in 2021, 1.5 on admission improved back to baseline to 1.2 this morning  Essential  hypertension -Hold losartan to allow for diuresis  Normocytic anemia Thrombocytopenia -Anemia panel looks okay, monitor  Scheduled Meds: . enoxaparin (LOVENOX) injection  80 mg Subcutaneous Q24H  . furosemide  40 mg Intravenous Q12H  . insulin aspart  0-5 Units Subcutaneous QHS  . insulin aspart  0-9 Units Subcutaneous TID WC  . isosorbide-hydrALAZINE  1 tablet Oral TID  . metoprolol tartrate  5 mg Intravenous Q6H   Continuous Infusions: PRN Meds:.acetaminophen **OR** acetaminophen, perflutren lipid microspheres (DEFINITY) IV suspension, technetium albumin aggregated  Diet Orders (From admission, onward)    Start     Ordered   10/18/19 1613  Diet Carb Modified Fluid consistency: Thin; Room service appropriate? Yes; Fluid restriction: 1500 mL Fluid  Diet effective now       Comments: Low sodium  Question Answer Comment  Diet-HS Snack? Nothing   Calorie Level Medium 1600-2000   Fluid consistency: Thin   Room service appropriate? Yes   Fluid restriction: 1500 mL Fluid      10/18/19 1612          DVT prophylaxis:      Code Status: Full Code  Family Communication: no family at bedside   Status is: Observation  The patient will require care spanning > 2 midnights and should be moved to inpatient because: IV treatments appropriate due to intensity of illness or inability to take PO and Inpatient level of care appropriate due to severity of illness  Dispo: The patient is from: Home              Anticipated d/c is to: Home  Anticipated d/c date is: 2 days              Patient currently is not medically stable to d/c.   Consultants:  None   Procedures:  2D echo: pending  Microbiology  None   Antimicrobials: None     Objective: Vitals:   10/18/19 2200 10/19/19 0500 10/19/19 0852 10/19/19 1052  BP: (!) 144/73 105/61  125/65  Pulse: 87 81  86  Resp: 18 20    Temp: 98.8 F (37.1 C) 98.1 F (36.7 C)    TempSrc: Oral Oral    SpO2: 100% 92%      Weight:  (!) 170.5 kg (!) 169.8 kg   Height:   5\' 7"  (1.702 m)     Intake/Output Summary (Last 24 hours) at 10/19/2019 1055 Last data filed at 10/19/2019 10/21/2019 Gross per 24 hour  Intake 350 ml  Output 750 ml  Net -400 ml   Filed Weights   10/18/19 0109 10/19/19 0500 10/19/19 0852  Weight: (!) 158.8 kg (!) 170.5 kg (!) 169.8 kg    Examination:  Constitutional: NAD Eyes: no scleral icterus ENMT: Mucous membranes are moist.  Neck: normal, supple Respiratory: Difficult exam due to obesity but no wheezing, distant breath sounds and diminished at the bases Cardiovascular: Regular rate and rhythm, no murmurs / rubs / gallops.  1+ LE edema. Good peripheral pulses Abdomen: non distended, no tenderness. Bowel sounds positive.  Musculoskeletal: no clubbing / cyanosis.  Skin: no rashes Neurologic: Nonfocal, equal strength   Data Reviewed: I have independently reviewed following labs and imaging studies   CBC: Recent Labs  Lab 10/18/19 0252 10/19/19 0554  WBC 11.2* 9.0  NEUTROABS 6.5  --   HGB 9.8* 9.2*  HCT 31.9* 29.6*  MCV 96.7 96.4  PLT 143* 141*   Basic Metabolic Panel: Recent Labs  Lab 10/18/19 0252 10/19/19 0554  NA 139 137  K 3.8 4.1  CL 109 106  CO2 25 26  GLUCOSE 52* 165*  BUN 36* 28*  CREATININE 1.58* 1.29*  CALCIUM 7.9* 7.7*   Liver Function Tests: Recent Labs  Lab 10/18/19 0252 10/19/19 0554  AST 54* 40  ALT 44 38  ALKPHOS 94 87  BILITOT 1.2 0.9  PROT 8.3* 7.4  ALBUMIN 2.3* 2.1*   Coagulation Profile: No results for input(s): INR, PROTIME in the last 168 hours. HbA1C: Recent Labs    10/18/19 2052  HGBA1C 8.6*   CBG: Recent Labs  Lab 10/18/19 0508 10/18/19 0550 10/18/19 2050 10/18/19 2200 10/19/19 0729  GLUCAP 90 100* 165* 156* 134*    Recent Results (from the past 240 hour(s))  Respiratory Panel by RT PCR (Flu A&B, Covid) - Nasopharyngeal Swab     Status: None   Collection Time: 10/12/19  3:50 AM   Specimen: Nasopharyngeal  Swab  Result Value Ref Range Status   SARS Coronavirus 2 by RT PCR NEGATIVE NEGATIVE Final    Comment: (NOTE) SARS-CoV-2 target nucleic acids are NOT DETECTED.  The SARS-CoV-2 RNA is generally detectable in upper respiratoy specimens during the acute phase of infection. The lowest concentration of SARS-CoV-2 viral copies this assay can detect is 131 copies/mL. A negative result does not preclude SARS-Cov-2 infection and should not be used as the sole basis for treatment or other patient management decisions. A negative result may occur with  improper specimen collection/handling, submission of specimen other than nasopharyngeal swab, presence of viral mutation(s) within the areas targeted by this assay, and inadequate number of  viral copies (<131 copies/mL). A negative result must be combined with clinical observations, patient history, and epidemiological information. The expected result is Negative.  Fact Sheet for Patients:  https://www.moore.com/  Fact Sheet for Healthcare Providers:  https://www.young.biz/  This test is no t yet approved or cleared by the [place] FDA and  has been authorized for detection and/or diagnosis of SARS-CoV-2 by FDA under an Emergency Use Authorization (EUA). This EUA will remain  in effect (meaning this test can be used) for the duration of the COVID-19 declaration under Section 564(b)(1) of the Act, 21 U.S.C. section 360bbb-3(b)(1), unless the authorization is terminated or revoked sooner.     Influenza A by PCR NEGATIVE NEGATIVE Final   Influenza B by PCR NEGATIVE NEGATIVE Final    Comment: (NOTE) The Xpert Xpress SARS-CoV-2/FLU/RSV assay is intended as an aid in  the diagnosis of influenza from Nasopharyngeal swab specimens and  should not be used as a sole basis for treatment. Nasal washings and  aspirates are unacceptable for Xpert Xpress SARS-CoV-2/FLU/RSV  testing.  Fact Sheet for  Patients: https://www.moore.com/  Fact Sheet for Healthcare Providers: https://www.young.biz/  This test is not yet approved or cleared by the [place] FDA and  has been authorized for detection and/or diagnosis of SARS-CoV-2 by  FDA under an Emergency Use Authorization (EUA). This EUA will remain  in effect (meaning this test can be used) for the duration of the  Covid-19 declaration under Section 564(b)(1) of the Act, 21  U.S.C. section 360bbb-3(b)(1), unless the authorization is  terminated or revoked. Performed at [facility], [address], [place], [place] [address]   Respiratory Panel by RT PCR (Flu A&B, Covid) - Nasopharyngeal Swab     Status: None   Collection Time: 10/18/19  4:39 AM   Specimen: Nasopharyngeal Swab  Result Value Ref Range Status   SARS Coronavirus 2 by RT PCR NEGATIVE NEGATIVE Final    Comment: (NOTE) SARS-CoV-2 target nucleic acids are NOT DETECTED.  The SARS-CoV-2 RNA is generally detectable in upper respiratoy specimens during the acute phase of infection. The lowest concentration of SARS-CoV-2 viral copies this assay can detect is 131 copies/mL. A negative result does not preclude SARS-Cov-2 infection and should not be used as the sole basis for treatment or other patient management decisions. A negative result may occur with  improper specimen collection/handling, submission of specimen other than nasopharyngeal swab, presence of viral mutation(s) within the areas targeted by this assay, and inadequate number of viral copies (<131 copies/mL). A negative result must be combined with clinical observations, patient history, and epidemiological information. The expected result is Negative.  Fact Sheet for Patients:  https://www.moore.com/  Fact Sheet for Healthcare Providers:  https://www.young.biz/  This test is no t yet approved or cleared by  the [place] FDA and  has been authorized for detection and/or diagnosis of SARS-CoV-2 by FDA under an Emergency Use Authorization (EUA). This EUA will remain  in effect (meaning this test can be used) for the duration of the COVID-19 declaration under Section 564(b)(1) of the Act, 21 U.S.C. section 360bbb-3(b)(1), unless the authorization is terminated or revoked sooner.     Influenza A by PCR NEGATIVE NEGATIVE Final   Influenza B by PCR NEGATIVE NEGATIVE Final    Comment: (NOTE) The Xpert Xpress SARS-CoV-2/FLU/RSV assay is intended as an aid in  the diagnosis of influenza from Nasopharyngeal swab specimens and  should not be used as a sole basis for treatment. Nasal washings and  aspirates are unacceptable for Xpert Xpress SARS-CoV-2/FLU/RSV  testing.  Fact Sheet for Patients: https://www.moore.com/  Fact Sheet for Healthcare Providers: https://www.young.biz/  This test is not yet approved or cleared by the [place] FDA and  has been authorized for detection and/or diagnosis of SARS-CoV-2 by  FDA under an Emergency Use Authorization (EUA). This EUA will remain  in effect (meaning this test can be used) for the duration of the  Covid-19 declaration under Section 564(b)(1) of the Act, 21  U.S.C. section 360bbb-3(b)(1), unless the authorization is  terminated or revoked. Performed at [facility], [address] [place], [place] [address]      Radiology Studies: NM Pulmonary Perfusion  Result Date: 10/18/2019 CLINICAL DATA:  Shortness of breath and chest pain. Elevated D-dimer EXAM: NUCLEAR MEDICINE PERFUSION LUNG SCAN TECHNIQUE: Perfusion images were obtained in multiple projections after intravenous injection of radiopharmaceutical. Views: Anterior, posterior, left lateral, right lateral, RPO, LPO, RAO, LAO RADIOPHARMACEUTICALS:  4.0 mCi Tc-57m MAA IV COMPARISON:  Chest radiograph October 18, 2019 FINDINGS:  Radiotracer uptake is homogeneous and symmetric bilaterally. No focal perfusion defects are evident. IMPRESSION: No perfusion defects appreciable. No findings indicative of pulmonary embolus on this study. Electronically Signed   By: Bretta Bang III M.D.   On: 10/18/2019 13:51   VAS [place] LOWER EXTREMITY VENOUS (DVT)  Result Date: 10/18/2019  Lower Venous DVT Study Indications: Edema.  Risk Factors: None identified. Limitations: Body habitus and poor ultrasound/tissue interface. Comparison Study: No prior studies. Performing Technologist: Chanda Busing RVT  Examination Guidelines: A complete evaluation includes B-mode imaging, spectral Doppler, color Doppler, and power Doppler as needed of all accessible portions of each vessel. Bilateral testing is considered an integral part of a complete examination. Limited examinations for reoccurring indications may be performed as noted. The reflux portion of the exam is performed with the patient in reverse Trendelenburg.  +---------+---------------+---------+-----------+----------+--------------+ RIGHT    CompressibilityPhasicitySpontaneityPropertiesThrombus Aging +---------+---------------+---------+-----------+----------+--------------+ CFV      Full           Yes      Yes                                 +---------+---------------+---------+-----------+----------+--------------+ SFJ      Full                                                        +---------+---------------+---------+-----------+----------+--------------+ FV Prox  Full                                                        +---------+---------------+---------+-----------+----------+--------------+ FV Mid                  Yes      Yes                                 +---------+---------------+---------+-----------+----------+--------------+ FV Distal               Yes      Yes                                  +---------+---------------+---------+-----------+----------+--------------+  PFV      Full                                                        +---------+---------------+---------+-----------+----------+--------------+ POP      Full           Yes      Yes                                 +---------+---------------+---------+-----------+----------+--------------+ PTV                                                   Not visualized +---------+---------------+---------+-----------+----------+--------------+ PERO                                                  Not visualized +---------+---------------+---------+-----------+----------+--------------+   +---------+---------------+---------+-----------+----------+--------------+ LEFT     CompressibilityPhasicitySpontaneityPropertiesThrombus Aging +---------+---------------+---------+-----------+----------+--------------+ CFV      Full           Yes      Yes                                 +---------+---------------+---------+-----------+----------+--------------+ SFJ      Full                                                        +---------+---------------+---------+-----------+----------+--------------+ FV Prox  Full                                                        +---------+---------------+---------+-----------+----------+--------------+ FV Mid   Full                                                        +---------+---------------+---------+-----------+----------+--------------+ FV Distal               Yes      Yes                                 +---------+---------------+---------+-----------+----------+--------------+ PFV      Full                                                        +---------+---------------+---------+-----------+----------+--------------+ POP  Full           Yes      Yes                                  +---------+---------------+---------+-----------+----------+--------------+ PTV                                                   Not visualized +---------+---------------+---------+-----------+----------+--------------+ PERO     Full                                                        +---------+---------------+---------+-----------+----------+--------------+     Summary: RIGHT: - There is no evidence of deep vein thrombosis in the lower extremity. However, portions of this examination were limited- see technologist comments above.  - No cystic structure found in the popliteal fossa.  LEFT: - There is no evidence of deep vein thrombosis in the lower extremity. However, portions of this examination were limited- see technologist comments above.  - No cystic structure found in the popliteal fossa.  *See table(s) above for measurements and observations. Electronically signed by Waverly Ferrari MD on 10/18/2019 at 2:19:56 PM.    Final     Pamella Pert, MD, PhD [facility]  Between 7 am - 7 pm I am available, please contact me via Amion or Securechat  Between 7 pm - 7 am I am not available, please contact night coverage MD/APP via Amion

## 2019-10-19 NOTE — Evaluation (Signed)
Physical Therapy Evaluation Patient Details Name: Sheila Gibson MRN: [id number] DOB: Jan 24, 1970 Today's Date: 10/19/2019   History of Present Illness  49 year old female with history of morbid obesity, hypertension, chronic diastolic CHF, DM 2, came into the hospital with several weeks of dyspnea and cough.  Patient has noted lower extremity swelling as well as increased abdominal swelling and discomfort, bloating.  Pt admitted for Acute hypoxic respiratory failure due to acute on chronic diastolic CHF, underlying OHS/OSA  Clinical Impression  Pt admitted with above diagnosis.  Pt currently with functional limitations due to the deficits listed below (see PT Problem List). Pt will benefit from skilled PT to increase their independence and safety with mobility to allow discharge to the venue listed below.  Pt min/guard- supervision for mobility however very limited by dyspnea at this time.  Pt has 4 grown children at home that can assist her if needed.     Follow Up Recommendations No PT follow up    Equipment Recommendations  None recommended by PT    Recommendations for Other Services       Precautions / Restrictions Precautions Precautions: Fall      Mobility  Bed Mobility Overal bed mobility: Modified Independent                Transfers Overall transfer level: Needs assistance Equipment used: None Transfers: Sit to/from Stand Sit to Stand: Supervision            Ambulation/Gait Ambulation/Gait assistance: Min guard Gait Distance (Feet): 40 Feet Assistive device: None Gait Pattern/deviations: Step-through pattern;Decreased stride length;Wide base of support     General Gait Details: pt grazing hand rail however no required for stability, pt with 4/4 dyspnea and required recliner, SPO2 92-97% on room air (pt to start lasix today)  Stairs            Wheelchair Mobility    Modified Rankin (Stroke Patients Only)       Balance Overall  balance assessment: No apparent balance deficits (not formally assessed)                                           Pertinent Vitals/Pain Pain Assessment: No/denies pain    Home Living Family/patient expects to be discharged to:: Private residence Living Arrangements: Children Available Help at Discharge: Family Type of Home: Apartment Home Access: Stairs to enter Entrance Stairs-Rails: Right Entrance Stairs-Number of Steps: steps up to front porch, then steps down to basement apartment, single handrail Home Layout: One level Home Equipment: None      Prior Function Level of Independence: Independent         Comments: Pt reports has 4 children at home with her (ages 10-30)     Hand Dominance        Extremity/Trunk Assessment        Lower Extremity Assessment Lower Extremity Assessment: Overall WFL for tasks assessed       Communication   Communication: No difficulties  Cognition Arousal/Alertness: Awake/alert Behavior During Therapy: WFL for tasks assessed/performed Overall Cognitive Status: Within Functional Limits for tasks assessed                                        General Comments      Exercises  Assessment/Plan    PT Assessment Patient needs continued PT services  PT Problem List Decreased mobility;Cardiopulmonary status limiting activity;Decreased activity tolerance;Obesity       PT Treatment Interventions DME instruction;Therapeutic exercise;Gait training;Stair training;Functional mobility training;Therapeutic activities;Patient/family education    PT Goals (Current goals can be found in the Care Plan section)  Acute Rehab PT Goals PT Goal Formulation: With patient Time For Goal Achievement: 11/02/19 Potential to Achieve Goals: Good    Frequency Min 3X/week   Barriers to discharge        Co-evaluation               AM-PAC PT "6 Clicks" Mobility  Outcome Measure Help needed turning  from your back to your side while in a flat bed without using bedrails?: None Help needed moving from lying on your back to sitting on the side of a flat bed without using bedrails?: None Help needed moving to and from a bed to a chair (including a wheelchair)?: None Help needed standing up from a chair using your arms (e.g., wheelchair or bedside chair)?: None Help needed to walk in hospital room?: A Little Help needed climbing 3-5 steps with a railing? : A Little 6 Click Score: 22    End of Session   Activity Tolerance: Patient tolerated treatment well Patient left: in chair;with call bell/phone within reach Nurse Communication: Mobility status PT Visit Diagnosis: Difficulty in walking, not elsewhere classified (R26.2)    Time: 9509-3267 PT Time Calculation (min) (ACUTE ONLY): 21 min   Charges:   PT Evaluation $PT Eval Low Complexity: 1 Low     Kati PT, DPT Acute Rehabilitation Services Pager: [phone] Office: [phone]  Maida Sale E 10/19/2019, 11:43 AM

## 2019-10-19 NOTE — Progress Notes (Signed)
Patient called out c/o left lower quad abdominal pain. States it's a 7/10. Pt is moaning and restless. Also c/o nausea. Md notified and orders received. Melton Alar, RN

## 2019-10-20 DIAGNOSIS — R0602 Shortness of breath: Secondary | ICD-10-CM | POA: Diagnosis not present

## 2019-10-20 LAB — CBC
HCT: 28 % — ABNORMAL LOW (ref 36.0–46.0)
Hemoglobin: 8.7 g/dL — ABNORMAL LOW (ref 12.0–15.0)
MCH: 29.8 pg (ref 26.0–34.0)
MCHC: 31.1 g/dL (ref 30.0–36.0)
MCV: 95.9 fL (ref 80.0–100.0)
Platelets: 123 10*3/uL — ABNORMAL LOW (ref 150–400)
RBC: 2.92 MIL/uL — ABNORMAL LOW (ref 3.87–5.11)
RDW: 15 % (ref 11.5–15.5)
WBC: 8.7 10*3/uL (ref 4.0–10.5)
nRBC: 0 % (ref 0.0–0.2)

## 2019-10-20 LAB — COMPREHENSIVE METABOLIC PANEL
ALT: 36 U/L (ref 0–44)
AST: 41 U/L (ref 15–41)
Albumin: 2 g/dL — ABNORMAL LOW (ref 3.5–5.0)
Alkaline Phosphatase: 90 U/L (ref 38–126)
Anion gap: 6 (ref 5–15)
BUN: 31 mg/dL — ABNORMAL HIGH (ref 6–20)
CO2: 24 mmol/L (ref 22–32)
Calcium: 7.5 mg/dL — ABNORMAL LOW (ref 8.9–10.3)
Chloride: 106 mmol/L (ref 98–111)
Creatinine, Ser: 1.63 mg/dL — ABNORMAL HIGH (ref 0.44–1.00)
GFR, Estimated: 37 mL/min — ABNORMAL LOW (ref >60)
Glucose, Bld: 157 mg/dL — ABNORMAL HIGH (ref 70–99)
Potassium: 3.9 mmol/L (ref 3.5–5.1)
Sodium: 136 mmol/L (ref 135–145)
Total Bilirubin: 1 mg/dL (ref 0.3–1.2)
Total Protein: 7.3 g/dL (ref 6.5–8.1)

## 2019-10-20 LAB — GLUCOSE, CAPILLARY
Glucose-Capillary: 135 mg/dL — ABNORMAL HIGH (ref 70–99)
Glucose-Capillary: 125 mg/dL — ABNORMAL HIGH (ref 70–99)
Glucose-Capillary: 189 mg/dL — ABNORMAL HIGH (ref 70–99)
Glucose-Capillary: 177 mg/dL — ABNORMAL HIGH (ref 70–99)

## 2019-10-20 NOTE — Plan of Care (Signed)
  Problem: Education: Goal: Knowledge of General Education information will improve Description: Including pain rating scale, medication(s)/side effects and non-pharmacologic comfort measures Outcome: Progressing   Problem: Activity: Goal: Capacity to carry out activities will improve Outcome: Progressing   Problem: Safety: Goal: Ability to remain free from injury will improve Outcome: Progressing

## 2019-10-20 NOTE — Progress Notes (Signed)
PROGRESS NOTE  Sheila Gibson WJX:[id number] DOB: April 11, 1970 DOA: 10/18/2019 PCP: Patient, No Pcp Per   LOS: 1 day   Brief Narrative / Interim history: 49 year old female with history of morbid obesity, hypertension, chronic diastolic CHF, DM 2, came into the hospital with several weeks of dyspnea and cough.  Patient has noted lower extremity swelling as well as increased abdominal swelling and discomfort, bloating.  She says that she has gained about 25 pounds since last time being in the hospital several months ago.  She was seen in the ER about a week ago, was given Lasix and breathing treatments and sent home, however came back to the hospital because of her breathing.  She was hypoxic in the ED requiring 3 L supplemental oxygen.  Subjective / 24h Interval events: Was able to ambulate in the hallway yesterday-went to the nurses station, felt short of breath but overall better  Assessment & Plan: Principal Problem Acute hypoxic respiratory failure due to acute on chronic diastolic CHF, underlying OHS/OSA -Hypoxic to 3 L on admission, she was able to be weaned off on room air Lasix.  Continue Lasix today, ins and outs not charted -2D echo with normal EF and grade 1 diastolic dysfunction -Patient's breathing difficulties are likely multifactorial, there is a component of fluid overload however that is improving with Lasix.  She is extremely obese with a BMI of 58, and has untreated sleep apnea.  Chart review shows that she has gained 30 kg in the last 5 months, some fluids but I believe that is a component of the weight gain also.  Patient states that she has not made attempts for dietary restriction/weight loss  Active Problems Type 2 diabetes mellitus  -poorly controlled with an A1c of 8.6 -Continue sliding scale  CBG (last 3)  Recent Labs    10/19/19 2127 10/20/19 0725 10/20/19 1123  GLUCAP 148* 135* 125*   OHS/OSA -Recommended nightly BiPAP, continue while here however  she was unable to tolerate last night  Morbid obesity -Based on BMI 58, she would benefit from significant weight loss  Hypothyroidism -Resume home Synthroid  Chronic kidney disease stage IIIa -Baseline creatinine 1.2-1.3 in 2021, 1.6 this morning with Lasix, still within acceptable range, continue Lasix  Essential hypertension -Hold losartan to allow for diuresis  Normocytic anemia Thrombocytopenia -Anemia panel looks okay, monitor  Sch Eduled Meds: . divalproex  500 mg Oral Daily  . enoxaparin (LOVENOX) injection  80 mg Subcutaneous Q24H  . furosemide  40 mg Intravenous Q12H  . gabapentin  800 mg Oral TID  . insulin aspart  0-5 Units Subcutaneous QHS  . insulin aspart  0-9 Units Subcutaneous TID WC  . isosorbide-hydrALAZINE  1 tablet Oral TID  . levothyroxine  50 mcg Oral QAC breakfast   Continuous Infusions: PRN Meds:.acetaminophen **OR** acetaminophen, hydrOXYzine, ipratropium-albuterol, technetium albumin aggregated, tiZANidine  Diet Orders (From admission, onward)    Start     Ordered   10/18/19 1613  Diet Carb Modified Fluid consistency: Thin; Room service appropriate? Yes; Fluid restriction: 1500 mL Fluid  Diet effective now       Comments: Low sodium  Question Answer Comment  Diet-HS Snack? Nothing   Calorie Level Medium 1600-2000   Fluid consistency: Thin   Room service appropriate? Yes   Fluid restriction: 1500 mL Fluid      10/18/19 1612          DVT prophylaxis:      Code Status: Full Code  Family Communication: no  family at bedside   Status is: Inpatient  Remains inpatient appropriate because:IV treatments appropriate due to intensity of illness or inability to take PO   Dispo: The patient is from: Home              Anticipated d/c is to: Home              Anticipated d/c date is: 1 day              Patient currently is not medically stable to d/c.   Consultants:  None   Procedures:  2D echo  Microbiology  None    Antimicrobials: None     Objective: Vitals:   10/20/19 0517 10/20/19 0601 10/20/19 1043 10/20/19 1312  BP: (!) 123/41 (!) 113/53  131/60  Pulse: 70 75  97  Resp: (!) 22 20  13   Temp: 98.6 F (37 C) 98 F (36.7 C)  98.6 F (37 C)  TempSrc:  Oral  Oral  SpO2: 100% 98%  96%  Weight:   (!) 170 kg   Height:        Intake/Output Summary (Last 24 hours) at 10/20/2019 1353 Last data filed at 10/20/2019 1129 Gross per 24 hour  Intake 600 ml  Output 1440 ml  Net -840 ml   Filed Weights   10/19/19 0500 10/19/19 0852 10/20/19 1043  Weight: (!) 170.5 kg (!) 169.8 kg (!) 170 kg    Examination:  Constitutional: In bed, no significant distress Eyes: No scleral icterus ENMT: mmm Neck: normal, supple Respiratory: Difficult exam due to obesity, no wheezing heard, distant breath sounds Cardiovascular: Regular rate and rhythm, no murmurs, trace edema Abdomen: Obese, bowel sounds distant with positive Musculoskeletal: no clubbing / cyanosis.  Skin: No rashes Neurologic: Nonfocal   Data Reviewed: I have independently reviewed following labs and imaging studies   CBC: Recent Labs  Lab 10/18/19 0252 10/19/19 0554 10/20/19 0431  WBC 11.2* 9.0 8.7  NEUTROABS 6.5  --   --   HGB 9.8* 9.2* 8.7*  HCT 31.9* 29.6* 28.0*  MCV 96.7 96.4 95.9  PLT 143* 141* 123*   Basic Metabolic Panel: Recent Labs  Lab 10/18/19 0252 10/19/19 0554 10/20/19 0431  NA 139 137 136  K 3.8 4.1 3.9  CL 109 106 106  CO2 25 26 24   GLUCOSE 52* 165* 157*  BUN 36* 28* 31*  CREATININE 1.58* 1.29* 1.63*  CALCIUM 7.9* 7.7* 7.5*   Liver Function Tests: Recent Labs  Lab 10/18/19 0252 10/19/19 0554 10/20/19 0431  AST 54* 40 41  ALT 44 38 36  ALKPHOS 94 87 90  BILITOT 1.2 0.9 1.0  PROT 8.3* 7.4 7.3  ALBUMIN 2.3* 2.1* 2.0*   Coagulation Profile: No results for input(s): INR, PROTIME in the last 168 hours. HbA1C: Recent Labs    10/18/19 2052  HGBA1C 8.6*   CBG: Recent Labs  Lab  10/19/19 1153 10/19/19 1629 10/19/19 2127 10/20/19 0725 10/20/19 1123  GLUCAP 195* 162* 148* 135* 125*    Recent Results (from the past 240 hour(s))  Respiratory Panel by RT PCR (Flu A&B, Covid) - Nasopharyngeal Swab     Status: None   Collection Time: 10/12/19  3:50 AM   Specimen: Nasopharyngeal Swab  Result Value Ref Range Status   SARS Coronavirus 2 by RT PCR NEGATIVE NEGATIVE Final    Comment: (NOTE) SARS-CoV-2 target nucleic acids are NOT DETECTED.  The SARS-CoV-2 RNA is generally detectable in upper respiratoy specimens during the acute phase  of infection. The lowest concentration of SARS-CoV-2 viral copies this assay can detect is 131 copies/mL. A negative result does not preclude SARS-Cov-2 infection and should not be used as the sole basis for treatment or other patient management decisions. A negative result may occur with  improper specimen collection/handling, submission of specimen other than nasopharyngeal swab, presence of viral mutation(s) within the areas targeted by this assay, and inadequate number of viral copies (<131 copies/mL). A negative result must be combined with clinical observations, patient history, and epidemiological information. The expected result is Negative.  Fact Sheet for Patients:  https://www.moore.com/  Fact Sheet for Healthcare Providers:  https://www.young.biz/  This test is no t yet approved or cleared by the [place] FDA and  has been authorized for detection and/or diagnosis of SARS-CoV-2 by FDA under an Emergency Use Authorization (EUA). This EUA will remain  in effect (meaning this test can be used) for the duration of the COVID-19 declaration under Section 564(b)(1) of the Act, 21 U.S.C. section 360bbb-3(b)(1), unless the authorization is terminated or revoked sooner.     Influenza A by PCR NEGATIVE NEGATIVE Final   Influenza B by PCR NEGATIVE NEGATIVE Final    Comment:  (NOTE) The Xpert Xpress SARS-CoV-2/FLU/RSV assay is intended as an aid in  the diagnosis of influenza from Nasopharyngeal swab specimens and  should not be used as a sole basis for treatment. Nasal washings and  aspirates are unacceptable for Xpert Xpress SARS-CoV-2/FLU/RSV  testing.  Fact Sheet for Patients: https://www.moore.com/  Fact Sheet for Healthcare Providers: https://www.young.biz/  This test is not yet approved or cleared by the [place] FDA and  has been authorized for detection and/or diagnosis of SARS-CoV-2 by  FDA under an Emergency Use Authorization (EUA). This EUA will remain  in effect (meaning this test can be used) for the duration of the  Covid-19 declaration under Section 564(b)(1) of the Act, 21  U.S.C. section 360bbb-3(b)(1), unless the authorization is  terminated or revoked. Performed at [facility], [address], [place], [place] [address]   Respiratory Panel by RT PCR (Flu A&B, Covid) - Nasopharyngeal Swab     Status: None   Collection Time: 10/18/19  4:39 AM   Specimen: Nasopharyngeal Swab  Result Value Ref Range Status   SARS Coronavirus 2 by RT PCR NEGATIVE NEGATIVE Final    Comment: (NOTE) SARS-CoV-2 target nucleic acids are NOT DETECTED.  The SARS-CoV-2 RNA is generally detectable in upper respiratoy specimens during the acute phase of infection. The lowest concentration of SARS-CoV-2 viral copies this assay can detect is 131 copies/mL. A negative result does not preclude SARS-Cov-2 infection and should not be used as the sole basis for treatment or other patient management decisions. A negative result may occur with  improper specimen collection/handling, submission of specimen other than nasopharyngeal swab, presence of viral mutation(s) within the areas targeted by this assay, and inadequate number of viral copies (<131 copies/mL). A negative result must be combined with  clinical observations, patient history, and epidemiological information. The expected result is Negative.  Fact Sheet for Patients:  https://www.moore.com/  Fact Sheet for Healthcare Providers:  https://www.young.biz/  This test is no t yet approved or cleared by the [place] FDA and  has been authorized for detection and/or diagnosis of SARS-CoV-2 by FDA under an Emergency Use Authorization (EUA). This EUA will remain  in effect (meaning this test can be used) for the duration of the COVID-19 declaration under Section 564(b)(1) of the Act,  21 U.S.C. section 360bbb-3(b)(1), unless the authorization is terminated or revoked sooner.     Influenza A by PCR NEGATIVE NEGATIVE Final   Influenza B by PCR NEGATIVE NEGATIVE Final    Comment: (NOTE) The Xpert Xpress SARS-CoV-2/FLU/RSV assay is intended as an aid in  the diagnosis of influenza from Nasopharyngeal swab specimens and  should not be used as a sole basis for treatment. Nasal washings and  aspirates are unacceptable for Xpert Xpress SARS-CoV-2/FLU/RSV  testing.  Fact Sheet for Patients: https://www.moore.com/  Fact Sheet for Healthcare Providers: https://www.young.biz/  This test is not yet approved or cleared by the [place] FDA and  has been authorized for detection and/or diagnosis of SARS-CoV-2 by  FDA under an Emergency Use Authorization (EUA). This EUA will remain  in effect (meaning this test can be used) for the duration of the  Covid-19 declaration under Section 564(b)(1) of the Act, 21  U.S.C. section 360bbb-3(b)(1), unless the authorization is  terminated or revoked. Performed at [facility], [place], [address] [place], [place] [address]      Radiology Studies: DG Abd 2 Views  Result Date: 10/19/2019 CLINICAL DATA:  49 year old female with nausea. EXAM: ABDOMEN - 2 VIEW COMPARISON:  Radiograph dated  09/10/2017. FINDINGS: Evaluation is limited due to body habitus and soft tissue attenuation. No bowel dilatation or evidence of obstruction. There is air within the colon. The osseous structures and soft tissues are grossly unremarkable. IMPRESSION: Negative. Electronically Signed   By: Elgie Collard M.D.   On: 10/19/2019 20:13    Pamella Pert, MD, PhD [facility]  Between 7 am - 7 pm I am available, please contact me via Amion or Securechat  Between 7 pm - 7 am I am not available, please contact night coverage MD/APP via Amion

## 2019-10-20 NOTE — Progress Notes (Signed)
RN was checking patient, pt was sleeping, snoring with periods of apnea. Woke up patient and reminded to wear her BIPAP. Patient refused and educated. Will continue to monitor.

## 2019-10-21 ENCOUNTER — Inpatient Hospital Stay (HOSPITAL_COMMUNITY): Payer: Medicaid Other

## 2019-10-21 DIAGNOSIS — R0602 Shortness of breath: Secondary | ICD-10-CM | POA: Diagnosis not present

## 2019-10-21 LAB — PREGNANCY, URINE: Preg Test, Ur: NEGATIVE

## 2019-10-21 LAB — URINALYSIS, ROUTINE W REFLEX MICROSCOPIC
Bilirubin Urine: NEGATIVE
Glucose, UA: NEGATIVE mg/dL
Ketones, ur: NEGATIVE mg/dL
Leukocytes,Ua: NEGATIVE
Nitrite: NEGATIVE
Protein, ur: NEGATIVE mg/dL
Specific Gravity, Urine: 1.017 (ref 1.005–1.030)
pH: 5 (ref 5.0–8.0)

## 2019-10-21 LAB — LACTIC ACID, PLASMA
Lactic Acid, Venous: 3.3 mmol/L (ref 0.5–1.9)
Lactic Acid, Venous: 2.9 mmol/L (ref 0.5–1.9)

## 2019-10-21 LAB — BASIC METABOLIC PANEL
Anion gap: 7 (ref 5–15)
BUN: 35 mg/dL — ABNORMAL HIGH (ref 6–20)
CO2: 24 mmol/L (ref 22–32)
Calcium: 7.3 mg/dL — ABNORMAL LOW (ref 8.9–10.3)
Chloride: 104 mmol/L (ref 98–111)
Creatinine, Ser: 1.82 mg/dL — ABNORMAL HIGH (ref 0.44–1.00)
GFR, Estimated: 32 mL/min — ABNORMAL LOW (ref >60)
Glucose, Bld: 148 mg/dL — ABNORMAL HIGH (ref 70–99)
Potassium: 4.3 mmol/L (ref 3.5–5.1)
Sodium: 135 mmol/L (ref 135–145)

## 2019-10-21 LAB — BLOOD GAS, ARTERIAL
Acid-Base Excess: 1.4 mmol/L (ref 0.0–2.0)
Bicarbonate: 24.5 mmol/L (ref 20.0–28.0)
Drawn by: 560031
FIO2: 32
O2 Content: 3 L/min
O2 Saturation: 91 %
Patient temperature: 101.3
pCO2 arterial: 34.1 mmHg (ref 32.0–48.0)
pH, Arterial: 7.47 — ABNORMAL HIGH (ref 7.350–7.450)
pO2, Arterial: 62.1 mmHg — ABNORMAL LOW (ref 83.0–108.0)

## 2019-10-21 LAB — HEPATIC FUNCTION PANEL
ALT: 36 U/L (ref 0–44)
AST: 43 U/L — ABNORMAL HIGH (ref 15–41)
Albumin: 2.4 g/dL — ABNORMAL LOW (ref 3.5–5.0)
Alkaline Phosphatase: 109 U/L (ref 38–126)
Bilirubin, Direct: 0.3 mg/dL — ABNORMAL HIGH (ref 0.0–0.2)
Indirect Bilirubin: 0.8 mg/dL (ref 0.3–0.9)
Total Bilirubin: 1.1 mg/dL (ref 0.3–1.2)
Total Protein: 8.5 g/dL — ABNORMAL HIGH (ref 6.5–8.1)

## 2019-10-21 LAB — LIPASE, BLOOD: Lipase: 40 U/L (ref 11–51)

## 2019-10-21 LAB — RESPIRATORY PANEL BY RT PCR (FLU A&B, COVID)
Influenza A by PCR: NEGATIVE
Influenza B by PCR: NEGATIVE
SARS Coronavirus 2 by RT PCR: NEGATIVE

## 2019-10-21 LAB — GLUCOSE, CAPILLARY
Glucose-Capillary: 139 mg/dL — ABNORMAL HIGH (ref 70–99)
Glucose-Capillary: 144 mg/dL — ABNORMAL HIGH (ref 70–99)
Glucose-Capillary: 136 mg/dL — ABNORMAL HIGH (ref 70–99)
Glucose-Capillary: 146 mg/dL — ABNORMAL HIGH (ref 70–99)

## 2019-10-21 MED ORDER — GABAPENTIN 100 MG PO CAPS
100.0000 mg | ORAL_CAPSULE | Freq: Three times a day (TID) | ORAL | Status: DC
  Administered 2019-10-21 – 2019-10-24 (×9): 100 mg via ORAL
  Filled 2019-10-21 (×9): qty 1

## 2019-10-21 MED ORDER — SODIUM CHLORIDE 0.9 % IV BOLUS
1000.0000 mL | Freq: Once | INTRAVENOUS | Status: AC
  Administered 2019-10-21: 1000 mL via INTRAVENOUS

## 2019-10-21 MED ORDER — VANCOMYCIN HCL 10 G IV SOLR
2500.0000 mg | Freq: Once | INTRAVENOUS | Status: AC
  Administered 2019-10-21: 2500 mg via INTRAVENOUS
  Filled 2019-10-21: qty 2000

## 2019-10-21 MED ORDER — METRONIDAZOLE IN NACL 5-0.79 MG/ML-% IV SOLN
500.0000 mg | Freq: Three times a day (TID) | INTRAVENOUS | Status: DC
  Administered 2019-10-21 – 2019-10-24 (×9): 500 mg via INTRAVENOUS
  Filled 2019-10-21 (×10): qty 100

## 2019-10-21 MED ORDER — IOHEXOL 9 MG/ML PO SOLN
500.0000 mL | ORAL | Status: AC
  Administered 2019-10-21 (×2): 500 mL via ORAL

## 2019-10-21 MED ORDER — SODIUM CHLORIDE 0.9 % IV SOLN
2.0000 g | Freq: Three times a day (TID) | INTRAVENOUS | Status: DC
  Administered 2019-10-21 – 2019-10-24 (×8): 2 g via INTRAVENOUS
  Filled 2019-10-21 (×10): qty 2

## 2019-10-21 MED ORDER — VANCOMYCIN HCL 1500 MG/300ML IV SOLN
1500.0000 mg | INTRAVENOUS | Status: DC
  Administered 2019-10-22 – 2019-10-23 (×2): 1500 mg via INTRAVENOUS
  Filled 2019-10-21 (×2): qty 300

## 2019-10-21 MED ORDER — LIP MEDEX EX OINT
1.0000 "application " | TOPICAL_OINTMENT | CUTANEOUS | Status: DC | PRN
  Filled 2019-10-21: qty 7

## 2019-10-21 MED ORDER — IOHEXOL 9 MG/ML PO SOLN
ORAL | Status: AC
  Filled 2019-10-21: qty 1000

## 2019-10-21 NOTE — Progress Notes (Signed)
PT Cancellation Note  Patient Details Name: Analese Sovine MRN: [id number] DOB: [birth year]   Cancelled Treatment:    Reason Eval/Treat Not Completed: Patient at procedure or test/unavailable   Gerrett Loman,KATHrine E 10/21/2019, 3:24 PM Paulino Door, DPT Acute Rehabilitation Services Pager: [phone] Office: [phone]

## 2019-10-21 NOTE — Progress Notes (Addendum)
Pt began moaning, shaking, complaining of abd pain. MD was notified and pt was ordered abd CT scan. Val Eagle

## 2019-10-21 NOTE — Progress Notes (Signed)
CRITICAL VALUE ALERT  Critical Value:  Lactic acid 3.3 Date & Time Notied:  10/21/2019 1420 Provider Notified: C. Gherghe MD  Orders Received/Actions taken: awaiting response. Nino Parsley

## 2019-10-21 NOTE — Plan of Care (Signed)
  Problem: Education: Goal: Knowledge of General Education information will improve Description: Including pain rating scale, medication(s)/side effects and non-pharmacologic comfort measures Outcome: Progressing   Problem: Health Behavior/Discharge Planning: Goal: Ability to manage health-related needs will improve Outcome: Progressing   Problem: Safety: Goal: Ability to remain free from injury will improve Outcome: Progressing   

## 2019-10-21 NOTE — Progress Notes (Signed)
PROGRESS NOTE  Sheila Gibson WUJ:[id number] DOB: 1970-02-06 DOA: 10/18/2019 PCP: Patient, No Pcp Per   LOS: 2 days   Brief Narrative / Interim history: 49 year old female with history of morbid obesity, hypertension, chronic diastolic CHF, DM 2, came into the hospital with several weeks of dyspnea and cough.  Patient has noted lower extremity swelling as well as increased abdominal swelling and discomfort, bloating.  She says that she has gained about 25 pounds since last time being in the hospital several months ago.  She was seen in the ER about a week ago, was given Lasix and breathing treatments and sent home, however came back to the hospital because of her breathing.  She was hypoxic in the ED requiring 3 L supplemental oxygen.  Subjective / 24h Interval events: Shortness of breath is improved.  She is complaining of abdominal pain today  Assessment & Plan: Principal Problem Acute hypoxic respiratory failure due to acute on chronic diastolic CHF, underlying OHS/OSA -Hypoxic to 3 L on admission, she was able to be weaned off on room air Lasix.  Received IV Lasix for couple of days, hold today given drop in weight and elevation in creatinine -2D echo with normal EF and grade 1 diastolic dysfunction -Patient's breathing difficulties are likely multifactorial, there is a component of fluid overload however that is improving with Lasix.  She is extremely obese with a BMI of 58, and has untreated sleep apnea.  Chart review shows that she has gained 30 kg in the last 5 months, some fluids but I believe that is a component of the weight gain also.  Patient states that she has not made attempts for dietary restriction/weight loss  Active Problems Type 2 diabetes mellitus  -poorly controlled with an A1c of 8.6 -Continue sliding scale  CBG (last 3)  Recent Labs    10/20/19 1620 10/20/19 2059 10/21/19 0712  GLUCAP 189* 177* 139*   OHS/OSA -Recommended nightly BiPAP, continue  while here however she was unable to tolerate last night  Abdominal pain -Had brief abdominal pain couple of days ago, resolved with her home Neurontin which was not restarted at that time.  She is having recurrent abdominal pain today, no nausea or vomiting.  Abdominal x-ray few days ago unremarkable.  Obtain LFTs, lipase, obtain CT scan of the abdomen and pelvis with oral contrast  Morbid obesity -Based on BMI 58, she would benefit from significant weight loss  Hypothyroidism -Resume home Synthroid  Acute kidney injury on chronic kidney disease stage IIIa -Baseline creatinine 1.2-1.3 in 2021, creatinine up due to Lasix, hold Lasix today and monitor  Essential hypertension -Hold losartan to allow for diuresis  Normocytic anemia Thrombocytopenia -Anemia panel looks okay, monitor  Sch Eduled Meds: . divalproex  500 mg Oral Daily  . enoxaparin (LOVENOX) injection  80 mg Subcutaneous Q24H  . gabapentin  800 mg Oral TID  . insulin aspart  0-5 Units Subcutaneous QHS  . insulin aspart  0-9 Units Subcutaneous TID WC  . isosorbide-hydrALAZINE  1 tablet Oral TID  . levothyroxine  50 mcg Oral QAC breakfast   Continuous Infusions: PRN Meds:.acetaminophen **OR** acetaminophen, hydrOXYzine, ipratropium-albuterol, technetium albumin aggregated, tiZANidine  Diet Orders (From admission, onward)    Start     Ordered   10/18/19 1613  Diet Carb Modified Fluid consistency: Thin; Room service appropriate? Yes; Fluid restriction: 1500 mL Fluid  Diet effective now       Comments: Low sodium  Question Answer Comment  Diet-HS Snack? Nothing  Calorie Level Medium 1600-2000   Fluid consistency: Thin   Room service appropriate? Yes   Fluid restriction: 1500 mL Fluid      10/18/19 1612          DVT prophylaxis:      Code Status: Full Code  Family Communication: no family at bedside   Status is: Inpatient  Remains inpatient appropriate because:IV treatments appropriate due to  intensity of illness or inability to take PO   Dispo: The patient is from: Home              Anticipated d/c is to: Home              Anticipated d/c date is: 1 day              Patient currently is not medically stable to d/c.   Consultants:  None   Procedures:  2D echo  Microbiology  None   Antimicrobials: None     Objective: Vitals:   10/21/19 0440 10/21/19 0451 10/21/19 0500 10/21/19 0910  BP: (!) 122/56 (!) 109/46    Pulse: 95 97    Resp: (!) 36 (!) 24    Temp: 98 F (36.7 C) 97.8 F (36.6 C)    TempSrc: Oral Oral    SpO2: 96% 96%    Weight:   (!) 172 kg 73.4 kg  Height:        Intake/Output Summary (Last 24 hours) at 10/21/2019 1049 Last data filed at 10/21/2019 [id number] Gross per 24 hour  Intake 300 ml  Output 1140 ml  Net -840 ml   Filed Weights   10/20/19 1043 10/21/19 0500 10/21/19 0910  Weight: (!) 170 kg (!) 172 kg 73.4 kg    Examination:  Constitutional: No apparent distress, sitting at the edge of the bed Eyes: No scleral icterus seen ENMT: mmm Neck: normal, supple Respiratory: Distant breath sounds due to obesity, no wheezing Cardiovascular: Distant heart sounds due to obesity, no murmurs appreciated Abdomen: exam difficult due to obesity but not overtly tender Musculoskeletal: no clubbing / cyanosis.  Skin: No rashes Neurologic: No focal deficits   Data Reviewed: I have independently reviewed following labs and imaging studies   CBC: Recent Labs  Lab 10/18/19 0252 10/19/19 0554 10/20/19 0431  WBC 11.2* 9.0 8.7  NEUTROABS 6.5  --   --   HGB 9.8* 9.2* 8.7*  HCT 31.9* 29.6* 28.0*  MCV 96.7 96.4 95.9  PLT 143* 141* 123*   Basic Metabolic Panel: Recent Labs  Lab 10/18/19 0252 10/19/19 0554 10/20/19 0431 10/21/19 0352  NA 139 137 136 135  K 3.8 4.1 3.9 4.3  CL 109 106 106 104  CO2 25 26 24 24   GLUCOSE 52* 165* 157* 148*  BUN 36* 28* 31* 35*  CREATININE 1.58* 1.29* 1.63* 1.82*  CALCIUM 7.9* 7.7* 7.5* 7.3*   Liver  Function Tests: Recent Labs  Lab 10/18/19 0252 10/19/19 0554 10/20/19 0431  AST 54* 40 41  ALT 44 38 36  ALKPHOS 94 87 90  BILITOT 1.2 0.9 1.0  PROT 8.3* 7.4 7.3  ALBUMIN 2.3* 2.1* 2.0*   Coagulation Profile: No results for input(s): INR, PROTIME in the last 168 hours. HbA1C: Recent Labs    10/18/19 2052  HGBA1C 8.6*   CBG: Recent Labs  Lab 10/20/19 0725 10/20/19 1123 10/20/19 1620 10/20/19 2059 10/21/19 0712  GLUCAP 135* 125* 189* 177* 139*    Recent Results (from the past 240 hour(s))  Respiratory Panel by RT  PCR (Flu A&B, Covid) - Nasopharyngeal Swab     Status: None   Collection Time: 10/12/19  3:50 AM   Specimen: Nasopharyngeal Swab  Result Value Ref Range Status   SARS Coronavirus 2 by RT PCR NEGATIVE NEGATIVE Final    Comment: (NOTE) SARS-CoV-2 target nucleic acids are NOT DETECTED.  The SARS-CoV-2 RNA is generally detectable in upper respiratoy specimens during the acute phase of infection. The lowest concentration of SARS-CoV-2 viral copies this assay can detect is 131 copies/mL. A negative result does not preclude SARS-Cov-2 infection and should not be used as the sole basis for treatment or other patient management decisions. A negative result may occur with  improper specimen collection/handling, submission of specimen other than nasopharyngeal swab, presence of viral mutation(s) within the areas targeted by this assay, and inadequate number of viral copies (<131 copies/mL). A negative result must be combined with clinical observations, patient history, and epidemiological information. The expected result is Negative.  Fact Sheet for Patients:  https://www.moore.com/  Fact Sheet for Healthcare Providers:  https://www.young.biz/  This test is no t yet approved or cleared by the [place] FDA and  has been authorized for detection and/or diagnosis of SARS-CoV-2 by FDA under an Emergency Use Authorization  (EUA). This EUA will remain  in effect (meaning this test can be used) for the duration of the COVID-19 declaration under Section 564(b)(1) of the Act, 21 U.S.C. section 360bbb-3(b)(1), unless the authorization is terminated or revoked sooner.     Influenza A by PCR NEGATIVE NEGATIVE Final   Influenza B by PCR NEGATIVE NEGATIVE Final    Comment: (NOTE) The Xpert Xpress SARS-CoV-2/FLU/RSV assay is intended as an aid in  the diagnosis of influenza from Nasopharyngeal swab specimens and  should not be used as a sole basis for treatment. Nasal washings and  aspirates are unacceptable for Xpert Xpress SARS-CoV-2/FLU/RSV  testing.  Fact Sheet for Patients: https://www.moore.com/  Fact Sheet for Healthcare Providers: https://www.young.biz/  This test is not yet approved or cleared by the [place] FDA and  has been authorized for detection and/or diagnosis of SARS-CoV-2 by  FDA under an Emergency Use Authorization (EUA). This EUA will remain  in effect (meaning this test can be used) for the duration of the  Covid-19 declaration under Section 564(b)(1) of the Act, 21  U.S.C. section 360bbb-3(b)(1), unless the authorization is  terminated or revoked. Performed at [facility], [address], [place], [place] [address]   Respiratory Panel by RT PCR (Flu A&B, Covid) - Nasopharyngeal Swab     Status: None   Collection Time: 10/18/19  4:39 AM   Specimen: Nasopharyngeal Swab  Result Value Ref Range Status   SARS Coronavirus 2 by RT PCR NEGATIVE NEGATIVE Final    Comment: (NOTE) SARS-CoV-2 target nucleic acids are NOT DETECTED.  The SARS-CoV-2 RNA is generally detectable in upper respiratoy specimens during the acute phase of infection. The lowest concentration of SARS-CoV-2 viral copies this assay can detect is 131 copies/mL. A negative result does not preclude SARS-Cov-2 infection and should not be used as the sole basis  for treatment or other patient management decisions. A negative result may occur with  improper specimen collection/handling, submission of specimen other than nasopharyngeal swab, presence of viral mutation(s) within the areas targeted by this assay, and inadequate number of viral copies (<131 copies/mL). A negative result must be combined with clinical observations, patient history, and epidemiological information. The expected result is Negative.  Fact Sheet for Patients:  https://www.moore.com/  Fact Sheet for Healthcare Providers:  https://www.young.biz/  This test is no t yet approved or cleared by the [place] FDA and  has been authorized for detection and/or diagnosis of SARS-CoV-2 by FDA under an Emergency Use Authorization (EUA). This EUA will remain  in effect (meaning this test can be used) for the duration of the COVID-19 declaration under Section 564(b)(1) of the Act, 21 U.S.C. section 360bbb-3(b)(1), unless the authorization is terminated or revoked sooner.     Influenza A by PCR NEGATIVE NEGATIVE Final   Influenza B by PCR NEGATIVE NEGATIVE Final    Comment: (NOTE) The Xpert Xpress SARS-CoV-2/FLU/RSV assay is intended as an aid in  the diagnosis of influenza from Nasopharyngeal swab specimens and  should not be used as a sole basis for treatment. Nasal washings and  aspirates are unacceptable for Xpert Xpress SARS-CoV-2/FLU/RSV  testing.  Fact Sheet for Patients: https://www.moore.com/  Fact Sheet for Healthcare Providers: https://www.young.biz/  This test is not yet approved or cleared by the [place] FDA and  has been authorized for detection and/or diagnosis of SARS-CoV-2 by  FDA under an Emergency Use Authorization (EUA). This EUA will remain  in effect (meaning this test can be used) for the duration of the  Covid-19 declaration under Section 564(b)(1) of the Act, 21   U.S.C. section 360bbb-3(b)(1), unless the authorization is  terminated or revoked. Performed at [facility], [address], [place], [place] [address]      Radiology Studies: No results found.  Pamella Pert, MD, PhD [facility]  Between 7 am - 7 pm I am available, please contact me via Amion or Securechat  Between 7 pm - 7 am I am not available, please contact night coverage MD/APP via Amion

## 2019-10-21 NOTE — Progress Notes (Addendum)
Pharmacy Antibiotic Note  Sheila Gibson is a 49 y.o. female admitted on 10/18/2019 with SOB.  Pharmacy has been consulted for vancomycin/cefepime/flagyl dosing for sepsis. Tmax 101.7 WBC 8.7 LA 3.3 SCr 1.82 Wt 161 Kg CXR neg CT abd/pelvis ordered  Plan: Vancomycin 2500 mg loading dose Vancomycin 1500 IV every 24 hours.  Goal trough 15-20 mcg/mL.  AUC 533.7. Vd 0.5, Css max/min 32.8/15 Cefepime 2 gm IV qh Flagyl 500 mg IV q8h F/u renal function, WBC, temp culture data Vancomycin trough as needed  Height: 5\' 7"  (170.2 cm) Weight: (!) 161 kg (354 lb 15.1 oz) IBW/kg (Calculated) : 61.6  Temp (24hrs), Avg:99.5 F (37.5 C), Min:97.8 F (36.6 C), Max:101.7 F (38.7 C)  Recent Labs  Lab 10/18/19 0252 10/19/19 0554 10/20/19 0431 10/21/19 0352 10/21/19 1330  WBC 11.2* 9.0 8.7  --   --   CREATININE 1.58* 1.29* 1.63* 1.82*  --   LATICACIDVEN  --   --   --   --  3.3*    Estimated Creatinine Clearance: 60.5 mL/min (A) (by C-G formula based on SCr of 1.82 mg/dL (H)).    No Known Allergies  Antimicrobials this admission: 10/15 vanc> 10/15 cefepime>> 10/15 flagyl>> Dose adjustments this admission:  Microbiology results: 10/15 BCx2: sent 10/12 Covid/Flu neg  Thank you for allowing pharmacy to be a part of this patient's care.  12/12, Pharm.D 10/21/2019 2:49 PM

## 2019-10-22 DIAGNOSIS — R0602 Shortness of breath: Secondary | ICD-10-CM | POA: Diagnosis not present

## 2019-10-22 LAB — COMPREHENSIVE METABOLIC PANEL
ALT: 31 U/L (ref 0–44)
AST: 34 U/L (ref 15–41)
Albumin: 2.1 g/dL — ABNORMAL LOW (ref 3.5–5.0)
Alkaline Phosphatase: 89 U/L (ref 38–126)
Anion gap: 11 (ref 5–15)
BUN: 40 mg/dL — ABNORMAL HIGH (ref 6–20)
CO2: 23 mmol/L (ref 22–32)
Calcium: 7.7 mg/dL — ABNORMAL LOW (ref 8.9–10.3)
Chloride: 104 mmol/L (ref 98–111)
Creatinine, Ser: 2.08 mg/dL — ABNORMAL HIGH (ref 0.44–1.00)
GFR, Estimated: 27 mL/min — ABNORMAL LOW (ref >60)
Glucose, Bld: 167 mg/dL — ABNORMAL HIGH (ref 70–99)
Potassium: 4 mmol/L (ref 3.5–5.1)
Sodium: 138 mmol/L (ref 135–145)
Total Bilirubin: 1.2 mg/dL (ref 0.3–1.2)
Total Protein: 7.9 g/dL (ref 6.5–8.1)

## 2019-10-22 LAB — GLUCOSE, CAPILLARY
Glucose-Capillary: 139 mg/dL — ABNORMAL HIGH (ref 70–99)
Glucose-Capillary: 155 mg/dL — ABNORMAL HIGH (ref 70–99)
Glucose-Capillary: 172 mg/dL — ABNORMAL HIGH (ref 70–99)
Glucose-Capillary: 178 mg/dL — ABNORMAL HIGH (ref 70–99)

## 2019-10-22 LAB — CBC WITH DIFFERENTIAL/PLATELET
Abs Immature Granulocytes: 0.35 10*3/uL — ABNORMAL HIGH (ref 0.00–0.07)
Basophils Absolute: 0.1 10*3/uL (ref 0.0–0.1)
Basophils Relative: 0 %
Eosinophils Absolute: 0.1 10*3/uL (ref 0.0–0.5)
Eosinophils Relative: 0 %
HCT: 29.8 % — ABNORMAL LOW (ref 36.0–46.0)
Hemoglobin: 9.6 g/dL — ABNORMAL LOW (ref 12.0–15.0)
Immature Granulocytes: 2 %
Lymphocytes Relative: 10 %
Lymphs Abs: 2 10*3/uL (ref 0.7–4.0)
MCH: 30.1 pg (ref 26.0–34.0)
MCHC: 32.2 g/dL (ref 30.0–36.0)
MCV: 93.4 fL (ref 80.0–100.0)
Monocytes Absolute: 1.5 10*3/uL — ABNORMAL HIGH (ref 0.1–1.0)
Monocytes Relative: 7 %
Neutro Abs: 17.1 10*3/uL — ABNORMAL HIGH (ref 1.7–7.7)
Neutrophils Relative %: 81 %
Platelets: 151 10*3/uL (ref 150–400)
RBC: 3.19 MIL/uL — ABNORMAL LOW (ref 3.87–5.11)
RDW: 15.3 % (ref 11.5–15.5)
WBC: 21.1 10*3/uL — ABNORMAL HIGH (ref 4.0–10.5)
nRBC: 0.1 % (ref 0.0–0.2)

## 2019-10-22 LAB — D-DIMER, QUANTITATIVE: D-Dimer, Quant: >20 ug/mL-FEU — ABNORMAL HIGH (ref 0.00–0.50)

## 2019-10-22 MED ORDER — SODIUM CHLORIDE 0.9 % IV BOLUS
500.0000 mL | Freq: Once | INTRAVENOUS | Status: AC
  Administered 2019-10-22: 500 mL via INTRAVENOUS

## 2019-10-22 NOTE — Progress Notes (Signed)
PROGRESS NOTE  Annalyssa Thune BMW:[id number] DOB: Jul 07, 1970 DOA: 10/18/2019 PCP: Patient, No Pcp Per   LOS: 3 days   Brief Narrative / Interim history: 49 year old female with history of morbid obesity, hypertension, chronic diastolic CHF, DM 2, came into the hospital with several weeks of dyspnea and cough.  Patient has noted lower extremity swelling as well as increased abdominal swelling and discomfort, bloating.  She says that she has gained about 25 pounds since last time being in the hospital several months ago.  She was seen in the ER about a week ago, was given Lasix and breathing treatments and sent home, however came back to the hospital because of her breathing.  She was hypoxic in the ED requiring 3 L supplemental oxygen.  Subjective / 24h Interval events: Feeling better this morning.  Breathing is okay, no chest pain, no abdominal pain  Assessment & Plan: Principal Problem Acute hypoxic respiratory failure due to acute on chronic diastolic CHF, underlying OHS/OSA -Hypoxic to 3 L on admission, she was able to be weaned off on room air Lasix.  Received IV Lasix for couple of days, and was hold on 10/15 due to increasing creatinine -2D echo with normal EF and grade 1 diastolic dysfunction -Patient's breathing difficulties are likely multifactorial, there is a component of fluid overload however that is improving with Lasix.  She is extremely obese with a BMI of 58, and has untreated sleep apnea.  Chart review shows that she has gained 30 kg in the last 5 months, some fluids but I believe that is a component of the weight gain also.  Patient states that she has not made attempts for dietary restriction/weight loss  Active Problems Fever on 10/15, SIRS, abdominal pain -Patient developed fever on 10/15, slightly more tachypneic than normal, was complaining of abdominal pain.  She underwent a CT scan of the abdomen and pelvis which showed perhaps some evidence of bronchopneumonia  however she denies shortness of breath, cough, or congestion.  There was concern about possible enteritis, patient does report few diarrheal episodes couple of days ago.  Urinalysis has some bacteria in it but no WBC.  No clear source of fever, her lower extremity ultrasound was negative for DVT and VQ scan was negative as well.  Afebrile today.  Started on broad-spectrum antibiotics, continue while monitoring cultures  Type 2 diabetes mellitus  -poorly controlled with an A1c of 8.6 -Continue sliding scale  CBG (last 3)  Recent Labs    10/21/19 2147 10/22/19 0735 10/22/19 1121  GLUCAP 146* 139* 155*   OHS/OSA -Recommended nightly BiPAP as an outpatient, she was not set up yet, continue BiPAP nightly here  Morbid obesity -Based on BMI 58, she would benefit from significant weight loss  Hypothyroidism -Resume home Synthroid  Acute kidney injury on chronic kidney disease stage IIIa -Baseline creatinine 1.2-1.3 in 2021, creatinine up to 2 Lasix on 10/15, labs this morning are pending  Essential hypertension -Hold losartan to allow for diuresis  Normocytic anemia Thrombocytopenia -Anemia panel looks okay, monitor  Sch Eduled Meds: . divalproex  500 mg Oral Daily  . enoxaparin (LOVENOX) injection  80 mg Subcutaneous Q24H  . gabapentin  100 mg Oral TID  . insulin aspart  0-5 Units Subcutaneous QHS  . insulin aspart  0-9 Units Subcutaneous TID WC  . levothyroxine  50 mcg Oral QAC breakfast   Continuous Infusions: . ceFEPime (MAXIPIME) IV 2 g (10/22/19 0517)  . metronidazole 500 mg (10/22/19 0350)  . vancomycin  PRN Meds:.acetaminophen **OR** acetaminophen, hydrOXYzine, ipratropium-albuterol, lip balm, technetium albumin aggregated, tiZANidine  Diet Orders (From admission, onward)    Start     Ordered   10/18/19 1613  Diet Carb Modified Fluid consistency: Thin; Room service appropriate? Yes; Fluid restriction: 1500 mL Fluid  Diet effective now       Comments: Low sodium   Question Answer Comment  Diet-HS Snack? Nothing   Calorie Level Medium 1600-2000   Fluid consistency: Thin   Room service appropriate? Yes   Fluid restriction: 1500 mL Fluid      10/18/19 1612          DVT prophylaxis:      Code Status: Full Code  Family Communication: no family at bedside   Status is: Inpatient  Remains inpatient appropriate because:IV treatments appropriate due to intensity of illness or inability to take PO   Dispo: The patient is from: Home              Anticipated d/c is to: Home              Anticipated d/c date is: 1 day              Patient currently is not medically stable to d/c.   Consultants:  None   Procedures:  2D echo  Microbiology  None   Antimicrobials: None     Objective: Vitals:   10/22/19 0129 10/22/19 0500 10/22/19 0510 10/22/19 0937  BP: (!) 106/53  121/62 112/60  Pulse: 89  91 82  Resp: (!) 30  (!) 28 (!) 28  Temp: 98 F (36.7 C)  98.5 F (36.9 C) 98.7 F (37.1 C)  TempSrc: Oral  Oral Oral  SpO2: 98%  97% 94%  Weight:  (!) 172 kg    Height:        Intake/Output Summary (Last 24 hours) at 10/22/2019 1138 Last data filed at 10/22/2019 0630 Gross per 24 hour  Intake 1932.5 ml  Output 500 ml  Net 1432.5 ml   Filed Weights   10/21/19 0500 10/21/19 0910 10/22/19 0500  Weight: (!) 172 kg (!) 161 kg (!) 172 kg    Examination:  Constitutional: No distress, smiling, appears more comfortable Eyes: No scleral icterus ENMT: Moist mucous membranes Neck: normal, supple Respiratory: Exam difficult due to obesity but no wheezing, crackles, rhonchi Cardiovascular: Exam difficult due to obesity but no murmurs appreciated, no significant edema Abdomen: Exam difficult due to obesity but nontender, bowel sounds positive Musculoskeletal: no clubbing / cyanosis.  Skin: No rashes appreciated Neurologic: Nonfocal   Data Reviewed: I have independently reviewed following labs and imaging studies   CBC: Recent Labs    Lab 10/18/19 0252 10/19/19 0554 10/20/19 0431  WBC 11.2* 9.0 8.7  NEUTROABS 6.5  --   --   HGB 9.8* 9.2* 8.7*  HCT 31.9* 29.6* 28.0*  MCV 96.7 96.4 95.9  PLT 143* 141* 123*   Basic Metabolic Panel: Recent Labs  Lab 10/18/19 0252 10/19/19 0554 10/20/19 0431 10/21/19 0352  NA 139 137 136 135  K 3.8 4.1 3.9 4.3  CL 109 106 106 104  CO2 25 26 24 24   GLUCOSE 52* 165* 157* 148*  BUN 36* 28* 31* 35*  CREATININE 1.58* 1.29* 1.63* 1.82*  CALCIUM 7.9* 7.7* 7.5* 7.3*   Liver Function Tests: Recent Labs  Lab 10/18/19 0252 10/19/19 0554 10/20/19 0431 10/21/19 1059  AST 54* 40 41 43*  ALT 44 38 36 36  ALKPHOS 94 87 90  109  BILITOT 1.2 0.9 1.0 1.1  PROT 8.3* 7.4 7.3 8.5*  ALBUMIN 2.3* 2.1* 2.0* 2.4*   Coagulation Profile: No results for input(s): INR, PROTIME in the last 168 hours. HbA1C: No results for input(s): HGBA1C in the last 72 hours. CBG: Recent Labs  Lab 10/21/19 1153 10/21/19 1743 10/21/19 2147 10/22/19 0735 10/22/19 1121  GLUCAP 144* 136* 146* 139* 155*    Recent Results (from the past 240 hour(s))  Respiratory Panel by RT PCR (Flu A&B, Covid) - Nasopharyngeal Swab     Status: None   Collection Time: 10/18/19  4:39 AM   Specimen: Nasopharyngeal Swab  Result Value Ref Range Status   SARS Coronavirus 2 by RT PCR NEGATIVE NEGATIVE Final    Comment: (NOTE) SARS-CoV-2 target nucleic acids are NOT DETECTED.  The SARS-CoV-2 RNA is generally detectable in upper respiratoy specimens during the acute phase of infection. The lowest concentration of SARS-CoV-2 viral copies this assay can detect is 131 copies/mL. A negative result does not preclude SARS-Cov-2 infection and should not be used as the sole basis for treatment or other patient management decisions. A negative result may occur with  improper specimen collection/handling, submission of specimen other than nasopharyngeal swab, presence of viral mutation(s) within the areas targeted by this assay,  and inadequate number of viral copies (<131 copies/mL). A negative result must be combined with clinical observations, patient history, and epidemiological information. The expected result is Negative.  Fact Sheet for Patients:  https://www.moore.com/  Fact Sheet for Healthcare Providers:  https://www.young.biz/  This test is no t yet approved or cleared by the [place] FDA and  has been authorized for detection and/or diagnosis of SARS-CoV-2 by FDA under an Emergency Use Authorization (EUA). This EUA will remain  in effect (meaning this test can be used) for the duration of the COVID-19 declaration under Section 564(b)(1) of the Act, 21 U.S.C. section 360bbb-3(b)(1), unless the authorization is terminated or revoked sooner.     Influenza A by PCR NEGATIVE NEGATIVE Final   Influenza B by PCR NEGATIVE NEGATIVE Final    Comment: (NOTE) The Xpert Xpress SARS-CoV-2/FLU/RSV assay is intended as an aid in  the diagnosis of influenza from Nasopharyngeal swab specimens and  should not be used as a sole basis for treatment. Nasal washings and  aspirates are unacceptable for Xpert Xpress SARS-CoV-2/FLU/RSV  testing.  Fact Sheet for Patients: https://www.moore.com/  Fact Sheet for Healthcare Providers: https://www.young.biz/  This test is not yet approved or cleared by the [place] FDA and  has been authorized for detection and/or diagnosis of SARS-CoV-2 by  FDA under an Emergency Use Authorization (EUA). This EUA will remain  in effect (meaning this test can be used) for the duration of the  Covid-19 declaration under Section 564(b)(1) of the Act, 21  U.S.C. section 360bbb-3(b)(1), unless the authorization is  terminated or revoked. Performed at [facility], [address], [place], [place] [address]   Culture, blood (Routine X 2) w Reflex to ID Panel     Status: None  (Preliminary result)   Collection Time: 10/21/19  1:30 PM   Specimen: BLOOD LEFT FOREARM  Result Value Ref Range Status   Specimen Description   Final    BLOOD LEFT FOREARM Performed at [facility], [address], [place], [place] [address]    Special Requests   Final    BOTTLES DRAWN AEROBIC AND ANAEROBIC Blood Culture adequate volume Performed at [facility], [address], [place], [place] [address]    Culture   Final    NO GROWTH < 24 HOURS Performed at [facility], [address], [place], [place] [address]    Report Status PENDING  Incomplete  Culture, blood (Routine X 2) w Reflex to ID Panel     Status: None (Preliminary result)   Collection Time: 10/21/19  1:30 PM   Specimen: BLOOD  Result Value Ref Range Status   Specimen Description   Final    BLOOD LEFT ANTECUBITAL Performed at [facility], [address] [place], [place] [address]    Special Requests   Final    BOTTLES DRAWN AEROBIC AND ANAEROBIC Blood Culture adequate volume Performed at [facility], [address], [place], [place] [address]    Culture   Final    NO GROWTH < 24 HOURS Performed at [facility], [address] [place], [place] [address]    Report Status PENDING  Incomplete  Respiratory Panel by RT PCR (Flu A&B, Covid) - Nasopharyngeal Swab     Status: None   Collection Time: 10/21/19  4:30 PM   Specimen: Nasopharyngeal Swab  Result Value Ref Range Status   SARS Coronavirus 2 by RT PCR NEGATIVE NEGATIVE Final    Comment: (NOTE) SARS-CoV-2 target nucleic acids are NOT DETECTED.  The SARS-CoV-2 RNA is generally detectable in upper respiratoy specimens during the acute phase of infection. The lowest concentration of SARS-CoV-2 viral copies this assay can detect is 131 copies/mL. A negative result does not preclude SARS-Cov-2 infection and should not be used as the sole basis for treatment or other  patient management decisions. A negative result may occur with  improper specimen collection/handling, submission of specimen other than nasopharyngeal swab, presence of viral mutation(s) within the areas targeted by this assay, and inadequate number of viral copies (<131 copies/mL). A negative result must be combined with clinical observations, patient history, and epidemiological information. The expected result is Negative.  Fact Sheet for Patients:  https://www.moore.com/  Fact Sheet for Healthcare Providers:  https://www.young.biz/  This test is no t yet approved or cleared by the [place] FDA and  has been authorized for detection and/or diagnosis of SARS-CoV-2 by FDA under an Emergency Use Authorization (EUA). This EUA will remain  in effect (meaning this test can be used) for the duration of the COVID-19 declaration under Section 564(b)(1) of the Act, 21 U.S.C. section 360bbb-3(b)(1), unless the authorization is terminated or revoked sooner.     Influenza A by PCR NEGATIVE NEGATIVE Final   Influenza B by PCR NEGATIVE NEGATIVE Final    Comment: (NOTE) The Xpert Xpress SARS-CoV-2/FLU/RSV assay is intended as an aid in  the diagnosis of influenza from Nasopharyngeal swab specimens and  should not be used as a sole basis for treatment. Nasal washings and  aspirates are unacceptable for Xpert Xpress SARS-CoV-2/FLU/RSV  testing.  Fact Sheet for Patients: https://www.moore.com/  Fact Sheet for Healthcare Providers: https://www.young.biz/  This test is not yet approved or cleared by the [place] FDA and  has been authorized for detection and/or diagnosis of SARS-CoV-2 by  FDA under an Emergency Use Authorization (EUA). This EUA will remain  in effect (meaning this test can be used) for the duration of the  Covid-19 declaration under Section 564(b)(1) of the Act, 21  U.S.C. section  360bbb-3(b)(1), unless the authorization is  terminated or revoked. Performed at [facility], [address] [place],  [place] [address]      Radiology Studies: CT ABDOMEN PELVIS WO CONTRAST  Result Date: 10/21/2019 CLINICAL DATA:  49 year old female with abdominal pain and distension. Acute kidney injury. Fever. EXAM: CT ABDOMEN AND PELVIS WITHOUT CONTRAST TECHNIQUE: Multidetector CT imaging of the abdomen and pelvis was performed following the standard protocol without IV contrast. COMPARISON:  Renal ultrasound 10/18/2019. CT Abdomen and Pelvis 08/27/2017. FINDINGS: Lower chest: Streaky and confluent bilateral lower lobe peribronchial opacity, beyond that typical of atelectasis. Mild superimposed respiratory motion artifact, and there is asymmetric elevation of the right hemidiaphragm. The middle lobes appear relatively spared. Cardiomegaly, which has progressed since 2019. No pericardial or pleural effusion. Hepatobiliary: Small volume of perihepatic ascites with simple fluid density. Chronic gallstones and/or sludge within the gallbladder, but no definite pericholecystic inflammation. The gallbladder is not distended. No discrete liver lesion in the absence of contrast. Pancreas: Within normal limits. Spleen: Trace perisplenic ascites, otherwise negative. Adrenals/Urinary Tract: Normal adrenal glands. Noncontrast kidneys are within normal limits. No hydronephrosis or hydroureter. No nephrolithiasis. Diminutive and unremarkable urinary bladder. Stomach/Bowel: No dilated large or small bowel loops. There is mild wall thickening throughout multiple small bowel loops in the left abdomen which contain dilute oral contrast (series 2, image 61). But oral contrast has reached the distal small bowel in the right lower quadrant although not yet the terminal ileum. Unremarkable stomach. No free air. There is a small volume of ascites in the lower small bowel mesentery. Vascular/Lymphatic:  Vascular patency is not evaluated in the absence of IV contrast. Normal caliber aorta. Minimal calcified atherosclerosis. Reproductive: Negative noncontrast appearance. Other: Generalized body wall edema, severe along the ventral abdomen and especially the left lateral abdominal wall and left flank. No soft tissue gas identified. Only trace free fluid in the pelvis. Musculoskeletal: No acute osseous abnormality identified. Spina bifida occulta in the lower thoracic and upper lumbar levels. IMPRESSION: 1. Streaky bilateral lower lobe opacity indeterminate for atelectasis versus bronchopneumonia. No pleural effusion. 2. Cardiomegaly. Generalized body wall edema, asymmetrically severe on the left. Small volume ascites in the abdomen. The constellation favors anasarca. 3. Negative for bowel obstruction although there is mild wall thickening throughout multiple small bowel loops in the left abdomen. Consider acute infectious enteritis. 4. Chronic gallstones.  No CT evidence of acute cholecystitis. 5. Unremarkable noncontrast kidneys and bladder. No evidence of obstructive uropathy. Electronically Signed   By: Odessa Fleming M.D.   On: 10/21/2019 17:33   DG CHEST PORT 1 VIEW  Result Date: 10/21/2019 CLINICAL DATA:  Fever EXAM: PORTABLE CHEST 1 VIEW COMPARISON:  October 18, 2019 FINDINGS: There is atelectatic change in the right base. The lungs elsewhere are clear. Heart is upper normal in size with pulmonary vascularity normal. No adenopathy. No bone lesions. IMPRESSION: Atelectatic change right base. Lungs elsewhere clear. Heart upper normal in size. No adenopathy appreciable. Electronically Signed   By: Bretta Bang III M.D.   On: 10/21/2019 13:40    Pamella Pert, MD, PhD [facility]  Between 7 am - 7 pm I am available, please contact me via Amion or Securechat  Between 7 pm - 7 am I am not available, please contact night coverage MD/APP via Amion

## 2019-10-22 NOTE — Progress Notes (Signed)
Pt c/o of loose stool-----3 loose golden brown consistency 5, denies abd cramps. MD updated; will continue to monitor, NO treatment ordered at this time per MD based on CT studies. SRP, RN

## 2019-10-22 NOTE — Plan of Care (Signed)
  Problem: Education: Goal: Knowledge of General Education information will improve Description: Including pain rating scale, medication(s)/side effects and non-pharmacologic comfort measures Outcome: Progressing   Problem: Cardiac: Goal: Ability to achieve and maintain adequate cardiopulmonary perfusion will improve Outcome: Progressing   Problem: Activity: Goal: Capacity to carry out activities will improve Outcome: Progressing

## 2019-10-22 NOTE — Progress Notes (Signed)
Pt with blood noted in the perineal area, noticed on bed pads after voiding pt reports she has been without her menses for a year or more. MD made aware. Will continue to monitor. SRP, RN

## 2019-10-22 NOTE — Progress Notes (Signed)
Physical Therapy Treatment Patient Details Name: Sheila Gibson MRN: [id number] DOB: 09/12/1970 Today's Date: 10/22/2019    History of Present Illness 48 year old female with history of morbid obesity, hypertension, chronic diastolic CHF, DM 2, came into the hospital with several weeks of dyspnea and cough.  Patient has noted lower extremity swelling as well as increased abdominal swelling and discomfort, bloating.  Pt admitted for Acute hypoxic respiratory failure due to acute on chronic diastolic CHF, underlying OHS/OSA    PT Comments    Pt making good progress today.  She was able to ambulate 120' with 2 standing rest breaks. Her O2 sats were stable on RA but DOE of 4/4 and resp rate of 40 breaths/min with activity.  Required cues for slow deep breaths. Continue to progress activity.    Follow Up Recommendations  No PT follow up     Equipment Recommendations  None recommended by PT    Recommendations for Other Services       Precautions / Restrictions Precautions Precautions: Fall    Mobility  Bed Mobility               General bed mobility comments: in chair  Transfers Overall transfer level: Needs assistance Equipment used: None Transfers: Sit to/from Stand Sit to Stand: Supervision         General transfer comment: performed x 2  Ambulation/Gait Ambulation/Gait assistance: Supervision Gait Distance (Feet): 120 Feet Assistive device: None Gait Pattern/deviations: Step-through pattern;Wide base of support Gait velocity: decreased   General Gait Details: Pt took 2 standing rest breaks due to DOE of 4/4.  Pt occasionally grazing hand rail but was stable.  She was on RA with sats 92-94%.  RR up to 40 breaths/min with activity   Stairs             Wheelchair Mobility    Modified Rankin (Stroke Patients Only)       Balance Overall balance assessment: No apparent balance deficits (not formally assessed)                                           Cognition Arousal/Alertness: Awake/alert Behavior During Therapy: WFL for tasks assessed/performed Overall Cognitive Status: Within Functional Limits for tasks assessed                                        Exercises      General Comments General comments (skin integrity, edema, etc.): encouraged to perform AROM as able independently; and mobility with nursing      Pertinent Vitals/Pain Pain Assessment: No/denies pain    Home Living                      Prior Function            PT Goals (current goals can now be found in the care plan section) Acute Rehab PT Goals PT Goal Formulation: With patient Time For Goal Achievement: 11/02/19 Potential to Achieve Goals: Good Progress towards PT goals: Progressing toward goals    Frequency    Min 3X/week      PT Plan Current plan remains appropriate    Co-evaluation              AM-PAC PT "6 Clicks" Mobility   Outcome  Measure  Help needed turning from your back to your side while in a flat bed without using bedrails?: None Help needed moving from lying on your back to sitting on the side of a flat bed without using bedrails?: None Help needed moving to and from a bed to a chair (including a wheelchair)?: None Help needed standing up from a chair using your arms (e.g., wheelchair or bedside chair)?: None Help needed to walk in hospital room?: None Help needed climbing 3-5 steps with a railing? : A Little 6 Click Score: 23    End of Session   Activity Tolerance: Patient tolerated treatment well Patient left: in chair;with call bell/phone within reach Nurse Communication: Mobility status PT Visit Diagnosis: Difficulty in walking, not elsewhere classified (R26.2)     Time: 4098-1191 PT Time Calculation (min) (ACUTE ONLY): 20 min  Charges:  $Gait Training: 8-22 mins                     Anise Salvo, PT Acute Rehab Services Pager [phone] [facility] [phone]     Rayetta Humphrey 10/22/2019, 10:54 AM

## 2019-10-23 DIAGNOSIS — R0602 Shortness of breath: Secondary | ICD-10-CM | POA: Diagnosis not present

## 2019-10-23 LAB — URINE CULTURE: Culture: <10000 — AB

## 2019-10-23 LAB — GLUCOSE, CAPILLARY
Glucose-Capillary: 95 mg/dL (ref 70–99)
Glucose-Capillary: 156 mg/dL — ABNORMAL HIGH (ref 70–99)
Glucose-Capillary: 163 mg/dL — ABNORMAL HIGH (ref 70–99)
Glucose-Capillary: 159 mg/dL — ABNORMAL HIGH (ref 70–99)

## 2019-10-23 LAB — CREATININE, SERUM
Creatinine, Ser: 1.7 mg/dL — ABNORMAL HIGH (ref 0.44–1.00)
GFR, Estimated: 35 mL/min — ABNORMAL LOW (ref >60)

## 2019-10-23 NOTE — Plan of Care (Signed)
  Problem: Education: Goal: Knowledge of General Education information will improve Description Including pain rating scale, medication(s)/side effects and non-pharmacologic comfort measures Outcome: Progressing   

## 2019-10-23 NOTE — Progress Notes (Signed)
PROGRESS NOTE  Sheila Gibson IHK:[id number] DOB: 06/29/1970 DOA: 10/18/2019 PCP: Patient, No Pcp Per   LOS: 4 days   Brief Narrative / Interim history: 49 year old female with history of morbid obesity, hypertension, chronic diastolic CHF, DM 2, came into the hospital with several weeks of dyspnea and cough.  Patient has noted lower extremity swelling as well as increased abdominal swelling and discomfort, bloating.  She says that she has gained about 25 pounds since last time being in the hospital several months ago.  She was seen in the ER about a week ago, was given Lasix and breathing treatments and sent home, however came back to the hospital because of her breathing.  She was hypoxic in the ED requiring 3 L supplemental oxygen.  Subjective / 24h Interval events: Sitting in the chair, feeling very well.  Eating breakfast.  Denies any shortness of breath.  Assessment & Plan: Principal Problem Acute hypoxic respiratory failure due to acute on chronic diastolic CHF, underlying OHS/OSA -Hypoxic to 3 L on admission, she was able to be weaned off on room air Lasix.  Received IV Lasix for couple of days, and was hold on 10/15 due to increasing creatinine -2D echo with normal EF and grade 1 diastolic dysfunction -Patient's breathing difficulties are likely multifactorial, there is a component of fluid overload however that is improving with Lasix.  She is extremely obese with a BMI of 58, and has untreated sleep apnea.  Chart review shows that she has gained 30 kg in the last 5 months, some fluids but I believe that is a component of the weight gain also.  Patient states that she has not made attempts for dietary restriction/weight loss -Remains on room air, able to ambulate  Active Problems Fever on 10/15, SIRS, abdominal pain, possible enteritis -Patient developed fever on 10/15, slightly more tachypneic than normal, was complaining of abdominal pain.  She underwent a CT scan of the  abdomen and pelvis which showed perhaps some evidence of bronchopneumonia however she denies shortness of breath, cough, or congestion.  There was concern about possible enteritis, has had few diarrheal episodes yesterday as well as few days ago.  She was started on broad-spectrum antibiotics including metronidazole, possibly helping.  If cultures remain stable for the next 24 hours, becomes afebrile, WBC improving, we will convert to p.o. and plan for home discharge  Type 2 diabetes mellitus  -poorly controlled with an A1c of 8.6 -Continue sliding scale  CBG (last 3)  Recent Labs    10/22/19 1602 10/22/19 2140 10/23/19 0727  GLUCAP 172* 178* 95   OHS/OSA -Recommended nightly BiPAP as an outpatient, she was not set up yet, continue BiPAP nightly here  Morbid obesity -Based on BMI 58, recommended weight loss, discussed with patient this morning.  She is malnourished and has a significant degree of third spacing  Hypothyroidism -Resume home Synthroid  Acute kidney injury on chronic kidney disease stage IIIa -Baseline creatinine 1.2-1.3 in 2021, creatinine up to 2 Lasix on 10/15, improving.  Essential hypertension -Hold losartan to allow for diuresis  Normocytic anemia Thrombocytopenia -Anemia panel looks okay, monitor  Sch Eduled Meds: . divalproex  500 mg Oral Daily  . enoxaparin (LOVENOX) injection  80 mg Subcutaneous Q24H  . gabapentin  100 mg Oral TID  . insulin aspart  0-5 Units Subcutaneous QHS  . insulin aspart  0-9 Units Subcutaneous TID WC  . levothyroxine  50 mcg Oral QAC breakfast   Continuous Infusions: . ceFEPime (MAXIPIME) IV  2 g (10/23/19 4098)  . metronidazole 500 mg (10/23/19 0411)  . vancomycin 150 mL/hr at 10/22/19 1807   PRN Meds:.acetaminophen **OR** acetaminophen, hydrOXYzine, ipratropium-albuterol, lip balm, technetium albumin aggregated, tiZANidine  Diet Orders (From admission, onward)    Start     Ordered   10/23/19 0912  Diet 2 gram sodium  Room service appropriate? Yes; Fluid consistency: Thin  Diet effective now       Question Answer Comment  Room service appropriate? Yes   Fluid consistency: Thin      10/23/19 0911         DVT prophylaxis:     Code Status: Full Code  Family Communication: no family at bedside   Status is: Inpatient  Remains inpatient appropriate because:IV treatments appropriate due to intensity of illness or inability to take PO   Dispo: The patient is from: Home              Anticipated d/c is to: Home              Anticipated d/c date is: 1 day              Patient currently is not medically stable to d/c.  Consultants:  None   Procedures:  2D echo  Microbiology  None   Antimicrobials: None     Objective: Vitals:   10/22/19 2040 10/22/19 2237 10/23/19 0536 10/23/19 0752  BP: (!) 121/58  (!) 111/59   Pulse: 98  89   Resp:  20 20   Temp: 99.5 F (37.5 C)  99.1 F (37.3 C)   TempSrc: Oral  Oral   SpO2: 94%  96%   Weight:   (!) 179.2 kg (!) 174.4 kg  Height:        Intake/Output Summary (Last 24 hours) at 10/23/2019 1046 Last data filed at 10/23/2019 1039 Gross per 24 hour  Intake 833.18 ml  Output 600 ml  Net 233.18 ml   Filed Weights   10/22/19 1307 10/23/19 0536 10/23/19 0752  Weight: (!) 172.6 kg (!) 179.2 kg (!) 174.4 kg    Examination:  Constitutional: No distress, in chair Eyes: No scleral icterus ENMT: Moist mucous membranes Neck: normal, supple Respiratory: Exam difficult due to obesity but no wheezing, no crackles, no rhonchi Cardiovascular: Exam difficult due to obesity but no significant murmurs, trace edema Abdomen: Exam difficult due to obesity but nontender, bowel sounds positive Musculoskeletal: no clubbing / cyanosis.  Skin: No rashes seen Neurologic: No focal deficits   Data Reviewed: I have independently reviewed following labs and imaging studies   CBC: Recent Labs  Lab 10/18/19 0252 10/19/19 0554 10/20/19 0431 10/22/19 1137    WBC 11.2* 9.0 8.7 21.1*  NEUTROABS 6.5  --   --  17.1*  HGB 9.8* 9.2* 8.7* 9.6*  HCT 31.9* 29.6* 28.0* 29.8*  MCV 96.7 96.4 95.9 93.4  PLT 143* 141* 123* 151   Basic Metabolic Panel: Recent Labs  Lab 10/18/19 0252 10/18/19 0252 10/19/19 0554 10/20/19 0431 10/21/19 0352 10/22/19 1137 10/23/19 0437  NA 139  --  137 136 135 138  --   K 3.8  --  4.1 3.9 4.3 4.0  --   CL 109  --  106 106 104 104  --   CO2 25  --  26 24 24 23   --   GLUCOSE 52*  --  165* 157* 148* 167*  --   BUN 36*  --  28* 31* 35* 40*  --  CREATININE 1.58*   < > 1.29* 1.63* 1.82* 2.08* 1.70*  CALCIUM 7.9*  --  7.7* 7.5* 7.3* 7.7*  --    < > = values in this interval not displayed.   Liver Function Tests: Recent Labs  Lab 10/18/19 0252 10/19/19 0554 10/20/19 0431 10/21/19 1059 10/22/19 1137  AST 54* 40 41 43* 34  ALT 44 38 36 36 31  ALKPHOS 94 87 90 109 89  BILITOT 1.2 0.9 1.0 1.1 1.2  PROT 8.3* 7.4 7.3 8.5* 7.9  ALBUMIN 2.3* 2.1* 2.0* 2.4* 2.1*   Coagulation Profile: No results for input(s): INR, PROTIME in the last 168 hours. HbA1C: No results for input(s): HGBA1C in the last 72 hours. CBG: Recent Labs  Lab 10/22/19 0735 10/22/19 1121 10/22/19 1602 10/22/19 2140 10/23/19 0727  GLUCAP 139* 155* 172* 178* 95    Recent Results (from the past 240 hour(s))  Respiratory Panel by RT PCR (Flu A&B, Covid) - Nasopharyngeal Swab     Status: None   Collection Time: 10/18/19  4:39 AM   Specimen: Nasopharyngeal Swab  Result Value Ref Range Status   SARS Coronavirus 2 by RT PCR NEGATIVE NEGATIVE Final    Comment: (NOTE) SARS-CoV-2 target nucleic acids are NOT DETECTED.  The SARS-CoV-2 RNA is generally detectable in upper respiratoy specimens during the acute phase of infection. The lowest concentration of SARS-CoV-2 viral copies this assay can detect is 131 copies/mL. A negative result does not preclude SARS-Cov-2 infection and should not be used as the sole basis for treatment or other patient  management decisions. A negative result may occur with  improper specimen collection/handling, submission of specimen other than nasopharyngeal swab, presence of viral mutation(s) within the areas targeted by this assay, and inadequate number of viral copies (<131 copies/mL). A negative result must be combined with clinical observations, patient history, and epidemiological information. The expected result is Negative.  Fact Sheet for Patients:  https://www.moore.com/  Fact Sheet for Healthcare Providers:  https://www.young.biz/  This test is no t yet approved or cleared by the [place] FDA and  has been authorized for detection and/or diagnosis of SARS-CoV-2 by FDA under an Emergency Use Authorization (EUA). This EUA will remain  in effect (meaning this test can be used) for the duration of the COVID-19 declaration under Section 564(b)(1) of the Act, 21 U.S.C. section 360bbb-3(b)(1), unless the authorization is terminated or revoked sooner.     Influenza A by PCR NEGATIVE NEGATIVE Final   Influenza B by PCR NEGATIVE NEGATIVE Final    Comment: (NOTE) The Xpert Xpress SARS-CoV-2/FLU/RSV assay is intended as an aid in  the diagnosis of influenza from Nasopharyngeal swab specimens and  should not be used as a sole basis for treatment. Nasal washings and  aspirates are unacceptable for Xpert Xpress SARS-CoV-2/FLU/RSV  testing.  Fact Sheet for Patients: https://www.moore.com/  Fact Sheet for Healthcare Providers: https://www.young.biz/  This test is not yet approved or cleared by the [place] FDA and  has been authorized for detection and/or diagnosis of SARS-CoV-2 by  FDA under an Emergency Use Authorization (EUA). This EUA will remain  in effect (meaning this test can be used) for the duration of the  Covid-19 declaration under Section 564(b)(1) of the Act, 21  U.S.C. section 360bbb-3(b)(1),  unless the authorization is  terminated or revoked. Performed at [facility], [address], [place], [place] [address]   Culture, blood (Routine X 2) w Reflex to ID Panel     Status:  None (Preliminary result)   Collection Time: 10/21/19  1:30 PM   Specimen: BLOOD LEFT FOREARM  Result Value Ref Range Status   Specimen Description   Final    BLOOD LEFT FOREARM Performed at [facility], [address] [place], [place] [address]    Special Requests   Final    BOTTLES DRAWN AEROBIC AND ANAEROBIC Blood Culture adequate volume Performed at [facility], [address] [place], [place] [address]    Culture   Final    NO GROWTH 2 DAYS Performed at [facility], [address], [place], [place] [address]    Report Status PENDING  Incomplete  Culture, blood (Routine X 2) w Reflex to ID Panel     Status: None (Preliminary result)   Collection Time: 10/21/19  1:30 PM   Specimen: BLOOD  Result Value Ref Range Status   Specimen Description   Final    BLOOD LEFT ANTECUBITAL Performed at [facility], [address], [place], [place] [address]    Special Requests   Final    BOTTLES DRAWN AEROBIC AND ANAEROBIC Blood Culture adequate volume Performed at [facility], [address], [place], [place] [address]    Culture   Final    NO GROWTH 2 DAYS Performed at [facility], [address], [place], [place] [address]    Report Status PENDING  Incomplete  Respiratory Panel by RT PCR (Flu A&B, Covid) - Nasopharyngeal Swab     Status: None   Collection Time: 10/21/19  4:30 PM   Specimen: Nasopharyngeal Swab  Result Value Ref Range Status   SARS Coronavirus 2 by RT PCR NEGATIVE NEGATIVE Final    Comment: (NOTE) SARS-CoV-2 target nucleic acids are NOT DETECTED.  The SARS-CoV-2 RNA is generally detectable in upper respiratoy specimens during the acute phase of infection. The  lowest concentration of SARS-CoV-2 viral copies this assay can detect is 131 copies/mL. A negative result does not preclude SARS-Cov-2 infection and should not be used as the sole basis for treatment or other patient management decisions. A negative result may occur with  improper specimen collection/handling, submission of specimen other than nasopharyngeal swab, presence of viral mutation(s) within the areas targeted by this assay, and inadequate number of viral copies (<131 copies/mL). A negative result must be combined with clinical observations, patient history, and epidemiological information. The expected result is Negative.  Fact Sheet for Patients:  https://www.moore.com/  Fact Sheet for Healthcare Providers:  https://www.young.biz/  This test is no t yet approved or cleared by the [place] FDA and  has been authorized for detection and/or diagnosis of SARS-CoV-2 by FDA under an Emergency Use Authorization (EUA). This EUA will remain  in effect (meaning this test can be used) for the duration of the COVID-19 declaration under Section 564(b)(1) of the Act, 21 U.S.C. section 360bbb-3(b)(1), unless the authorization is terminated or revoked sooner.     Influenza A by PCR NEGATIVE NEGATIVE Final   Influenza B by PCR NEGATIVE NEGATIVE Final    Comment: (NOTE) The Xpert Xpress SARS-CoV-2/FLU/RSV assay is intended as an aid in  the diagnosis of influenza from Nasopharyngeal swab specimens and  should not be used as a sole basis for treatment. Nasal washings and  aspirates are unacceptable for Xpert Xpress SARS-CoV-2/FLU/RSV  testing.  Fact Sheet for Patients: https://www.moore.com/  Fact Sheet for Healthcare Providers: https://www.young.biz/  This test is not yet approved or cleared by the  [place] FDA and  has been authorized for detection and/or diagnosis of SARS-CoV-2 by  FDA under  an TEFL teachermergency Use Authorization (EUA). This EUA will remain  in effect (meaning this test can be used) for the duration of the  Covid-19 declaration under Section 564(b)(1) of the Act, 21  U.S.C. section 360bbb-3(b)(1), unless the authorization is  terminated or revoked. Performed at [facility], [address] [place], [place] [address]   Culture, Urine     Status: Abnormal   Collection Time: 10/21/19 11:25 PM   Specimen: Urine, Random  Result Value Ref Range Status   Specimen Description   Final    URINE, RANDOM Performed at [facility], [address] [place], [place] [address]    Special Requests   Final    NONE Performed at [facility], [address] [place], [place] [address]    Culture (A)  Final    <10,000 COLONIES/mL INSIGNIFICANT GROWTH Performed at [facility], [address] [place], [place] [address]    Report Status 10/23/2019 FINAL  Final     Radiology Studies: No results found.  Pamella Pertostin Gerlean Cid, MD, PhD [facility]  Between 7 am - 7 pm I am available, please contact me via Amion or Securechat  Between 7 pm - 7 am I am not available, please contact night coverage MD/APP via Amion

## 2019-10-24 DIAGNOSIS — E162 Hypoglycemia, unspecified: Secondary | ICD-10-CM | POA: Diagnosis not present

## 2019-10-24 DIAGNOSIS — I5033 Acute on chronic diastolic (congestive) heart failure: Secondary | ICD-10-CM | POA: Diagnosis not present

## 2019-10-24 DIAGNOSIS — N179 Acute kidney failure, unspecified: Secondary | ICD-10-CM | POA: Diagnosis not present

## 2019-10-24 DIAGNOSIS — R0602 Shortness of breath: Secondary | ICD-10-CM | POA: Diagnosis not present

## 2019-10-24 LAB — COMPREHENSIVE METABOLIC PANEL
ALT: 26 U/L (ref 0–44)
AST: 32 U/L (ref 15–41)
Albumin: 1.9 g/dL — ABNORMAL LOW (ref 3.5–5.0)
Alkaline Phosphatase: 80 U/L (ref 38–126)
Anion gap: 9 (ref 5–15)
BUN: 30 mg/dL — ABNORMAL HIGH (ref 6–20)
CO2: 22 mmol/L (ref 22–32)
Calcium: 7.6 mg/dL — ABNORMAL LOW (ref 8.9–10.3)
Chloride: 108 mmol/L (ref 98–111)
Creatinine, Ser: 1.48 mg/dL — ABNORMAL HIGH (ref 0.44–1.00)
GFR, Estimated: 41 mL/min — ABNORMAL LOW (ref >60)
Glucose, Bld: 143 mg/dL — ABNORMAL HIGH (ref 70–99)
Potassium: 4 mmol/L (ref 3.5–5.1)
Sodium: 139 mmol/L (ref 135–145)
Total Bilirubin: 1 mg/dL (ref 0.3–1.2)
Total Protein: 7.5 g/dL (ref 6.5–8.1)

## 2019-10-24 LAB — CBC
HCT: 28.9 % — ABNORMAL LOW (ref 36.0–46.0)
Hemoglobin: 9.2 g/dL — ABNORMAL LOW (ref 12.0–15.0)
MCH: 30.1 pg (ref 26.0–34.0)
MCHC: 31.8 g/dL (ref 30.0–36.0)
MCV: 94.4 fL (ref 80.0–100.0)
Platelets: 148 10*3/uL — ABNORMAL LOW (ref 150–400)
RBC: 3.06 MIL/uL — ABNORMAL LOW (ref 3.87–5.11)
RDW: 15.2 % (ref 11.5–15.5)
WBC: 10.8 10*3/uL — ABNORMAL HIGH (ref 4.0–10.5)
nRBC: 0 % (ref 0.0–0.2)

## 2019-10-24 LAB — PHOSPHORUS: Phosphorus: 3.2 mg/dL (ref 2.5–4.6)

## 2019-10-24 LAB — MAGNESIUM: Magnesium: 2 mg/dL (ref 1.7–2.4)

## 2019-10-24 LAB — GLUCOSE, CAPILLARY
Glucose-Capillary: 128 mg/dL — ABNORMAL HIGH (ref 70–99)
Glucose-Capillary: 156 mg/dL — ABNORMAL HIGH (ref 70–99)

## 2019-10-24 MED ORDER — FUROSEMIDE 10 MG/ML IJ SOLN
40.0000 mg | Freq: Once | INTRAMUSCULAR | Status: AC
  Administered 2019-10-24: 40 mg via INTRAVENOUS
  Filled 2019-10-24: qty 4

## 2019-10-24 MED ORDER — METRONIDAZOLE 500 MG PO TABS: 500.0000 mg | ORAL_TABLET | Freq: Three times a day (TID) | ORAL | 0 refills | Status: AC

## 2019-10-24 NOTE — TOC Transition Note (Addendum)
Transition of Care [facility]) - CM/SW Discharge Note   Patient Details  Name: Sheila Gibson MRN: [id number] Date of Birth: 05/13/1970  Transition of Care [facility]) CM/SW Contact:  Darleene Cleaver, LCSW Phone Number: 10/24/2019, 1:08 PM   Clinical Narrative:     CSW was informed that patient needs a rolling walker.  CSW contacted [employer], they review orders and arrange to have walker delivered to room before she leaves.  CSW signing off, please reconsult with social work needs.  Final next level of care: Home/Self Care Barriers to Discharge: Barriers Resolved   Patient Goals and CMS Choice Patient states their goals for this hospitalization and ongoing recovery are:: To return back home. CMS Medicare.gov Compare Post Acute Care list provided to:: Patient Choice offered to / list presented to : Patient  Discharge Placement  Patient discharging back home.                     Discharge Plan and Services                DME Arranged: Walker rolling DME Agency: [employer] Date DME Agency Contacted: 10/24/19 Time DME Agency Contacted: 1308 Representative spoke with at DME Agency: Francesco Sor Arranged: NA          Social Determinants of Health (SDOH) Interventions     Readmission Risk Interventions No flowsheet data found.

## 2019-10-24 NOTE — Plan of Care (Signed)

## 2019-10-24 NOTE — Progress Notes (Addendum)
Pt has weakness and unsteady gait when getting up to bedside commode. Pt used front wheel walker while with nursing ambulating to recliner in room and beside commode to maintain stability. Reports she has low back pain and she needs support and uses the walker for assistance.  SW notified and MD updated. Walker ordered for home. SRP, RN

## 2019-10-24 NOTE — Progress Notes (Signed)
Pt discharged to home. Discharge instructions reviewed and acknowledged understanding.

## 2019-10-24 NOTE — Discharge Summary (Signed)
Physician Discharge Summary  [name] XIH:[id number] DOB: October 22, 1970 DOA: 10/18/2019  PCP: Patient, No Pcp Per  Admit date: 10/18/2019 Discharge date: 10/24/2019  Admitted From: home Disposition:  home  Recommendations for Outpatient Follow-up:  1. Follow up with PCP in 1-2 weeks  Home Health: none Equipment/Devices: home BiPAP  Discharge Condition: stable CODE STATUS: Full code Diet recommendation: low sodium  HPI: Per admitting MD, Sheila Gibson is a 49 y.o. female with medical history significant of HTN, DM2, morbid obesity. Presenting with 2 weeks of dyspnea and cough. She first noticed this when she was walking up the steps and got out of breath. No chest pain, just winded. Family had to assist her to her apartment. She was able to calm down some, but her breathing never quite returned to normal. She has noticed since that time her breathing has been on the decline. She has also noticed swelling in her legs over that time. She went to the ED about a week ago for these symptoms. She says that she was given lasix and breathing treatments. She was told that there was nothing else to do. Since then, everything has continued to worsen. She became concerned again and came to the ED. She reports no other aggravating or alleviating factors. She reports compliance with her lasix but this has not improved her situation.  Hospital Course / Discharge diagnoses: Principal Problem Acute hypoxic respiratory failure due to acute on chronic diastolic CHF, underlying OHS/OSA -patient was admitted to the hospital with running diagnosis of acute on chronic diastolic CHF, however on admission her chest x-ray was clear.  She received IV Lasix with subsequent increase in her creatinine and that had to be stopped.  Her clinical picture suggests intravascular depletion and a degree of third spacing given her malnutrition and probably high sodium intake.  She underwent a 2D echo which  showed normal EF and grade 1 diastolic dysfunction.  Her difficulties breathing are likely multifactorial. She is extremely obese with a BMI of 58, and has untreated sleep apnea.  Chart review shows that she has gained 30 kg in the last 5 months, some fluids but I believe that is a component of the weight gain also.  Patient states that she has not made attempts for dietary restriction/weight loss.  While she briefly needed 3 L on admission she was rapidly weaned off to room air, she is able to ambulate in the hallways without any breathing difficulties  Active Problems Sepsis due to possible enteritis -Patient developed fever on 10/15 along with worsening abdominal pain, increased WBC.  Patient did state that she has had abdominal pain as part of the reason for coming to the emergency room. She was placed on broad-spectrum antibiotics, cultures were obtained, and underwent a CT scan of the abdomen and pelvis which showed perhaps some evidence of bronchopneumonia however she denies shortness of breath, cough, or congestion.  There was concern about possible enteritis which is the possible culprit given several diarrheal episodes in the hospital.  She improved with antibiotics, her white count has improved and her diarrhea has slowed down.  Given response clinically to antibiotics will be narrowed to metronidazole to have GI coverage and she will complete the course upon discharge Type 2 diabetes mellitus  -poorly controlled with an A1c of 8.6, encouraged dietary compliance and weight loss, continue home medications OHS/OSA -Recommended nightly BiPAP as an outpatient, she is in the process of obtaining a BiPAP as an outpatient Morbid obesity -Based  on BMI 58, recommended weight loss, discussed with patient this morning, will make referral as an outpatient.  She is malnourished and has a significant degree of third spacing  Hypothyroidism -Resume home Synthroid Acute kidney injury on chronic kidney disease  stage IIIa -Baseline creatinine 1.2-1.3 in 2021, improving once IV Lasix has been held.  Resume home medication except for the ARB up until she is seen by her PCP in the next couple of weeks as scheduled Essential hypertension -Hold losartan Normocytic anemia Thrombocytopenia -No bleeding, stable  Discharge Instructions   Allergies as of 10/24/2019   No Known Allergies     Medication List    STOP taking these medications   ibuprofen 600 MG tablet Commonly known as: ADVIL   losartan 100 MG tablet Commonly known as: COZAAR     TAKE these medications   acetaminophen 500 MG tablet Commonly known as: TYLENOL Take 500 mg by mouth daily as needed for moderate pain.   albuterol 108 (90 Base) MCG/ACT inhaler Commonly known as: VENTOLIN HFA Inhale 1-2 puffs into the lungs every 6 (six) hours as needed for wheezing or shortness of breath.   ALPRAZolam 0.5 MG tablet Commonly known as: XANAX Take 0.5 mg by mouth at bedtime as needed for anxiety.   divalproex 500 MG DR tablet Commonly known as: DEPAKOTE Take 500 mg by mouth daily.   ferrous sulfate 325 (65 FE) MG tablet Take 1 tablet (325 mg total) by mouth 2 (two) times daily with a meal.   furosemide 40 MG tablet Commonly known as: Lasix Take 1 tablet (40 mg total) by mouth daily.   gabapentin 800 MG tablet Commonly known as: NEURONTIN Take 800 mg by mouth 3 (three) times daily.   hydrOXYzine 25 MG tablet Commonly known as: ATARAX/VISTARIL Take 25 mg by mouth 3 (three) times daily as needed for anxiety or itching. for anxiety   insulin glargine 100 unit/mL Sopn Commonly known as: LANTUS Inject 60 Units into the skin 2 (two) times daily.   ipratropium-albuterol 0.5-2.5 (3) MG/3ML Soln Commonly known as: DUONEB Take 3 mLs by nebulization every 6 (six) hours as needed.   levothyroxine 50 MCG tablet Commonly known as: SYNTHROID Take 1 tablet (50 mcg total) by mouth daily before breakfast.   metroNIDAZOLE 500 MG  tablet Commonly known as: Flagyl Take 1 tablet (500 mg total) by mouth 3 (three) times daily for 5 days.   polyethylene glycol 17 g packet Commonly known as: MIRALAX / GLYCOLAX Take 17 g by mouth daily as needed for moderate constipation or severe constipation.   potassium chloride 10 MEQ tablet Commonly known as: KLOR-CON Take 1 tablet (10 mEq total) by mouth daily.   senna-docusate 8.6-50 MG tablet Commonly known as: Senokot-S Take 2 tablets by mouth at bedtime as needed for mild constipation or moderate constipation.   tiZANidine 4 MG tablet Commonly known as: ZANAFLEX Take 4 mg by mouth 3 (three) times daily as needed for muscle spasms.   tranexamic acid 650 MG Tabs tablet Commonly known as: LYSTEDA Take 1,300 mg by mouth 3 (three) times daily.   zolpidem 10 MG tablet Commonly known as: AMBIEN Take 10 mg by mouth at bedtime as needed for sleep.        Consultations:  None  Procedures/Studies:  CT ABDOMEN PELVIS WO CONTRAST  Result Date: 10/21/2019 CLINICAL DATA:  49 year old female with abdominal pain and distension. Acute kidney injury. Fever. EXAM: CT ABDOMEN AND PELVIS WITHOUT CONTRAST TECHNIQUE: Multidetector CT imaging of the  abdomen and pelvis was performed following the standard protocol without IV contrast. COMPARISON:  Renal ultrasound 10/18/2019. CT Abdomen and Pelvis 08/27/2017. FINDINGS: Lower chest: Streaky and confluent bilateral lower lobe peribronchial opacity, beyond that typical of atelectasis. Mild superimposed respiratory motion artifact, and there is asymmetric elevation of the right hemidiaphragm. The middle lobes appear relatively spared. Cardiomegaly, which has progressed since 2019. No pericardial or pleural effusion. Hepatobiliary: Small volume of perihepatic ascites with simple fluid density. Chronic gallstones and/or sludge within the gallbladder, but no definite pericholecystic inflammation. The gallbladder is not distended. No discrete liver  lesion in the absence of contrast. Pancreas: Within normal limits. Spleen: Trace perisplenic ascites, otherwise negative. Adrenals/Urinary Tract: Normal adrenal glands. Noncontrast kidneys are within normal limits. No hydronephrosis or hydroureter. No nephrolithiasis. Diminutive and unremarkable urinary bladder. Stomach/Bowel: No dilated large or small bowel loops. There is mild wall thickening throughout multiple small bowel loops in the left abdomen which contain dilute oral contrast (series 2, image 61). But oral contrast has reached the distal small bowel in the right lower quadrant although not yet the terminal ileum. Unremarkable stomach. No free air. There is a small volume of ascites in the lower small bowel mesentery. Vascular/Lymphatic: Vascular patency is not evaluated in the absence of IV contrast. Normal caliber aorta. Minimal calcified atherosclerosis. Reproductive: Negative noncontrast appearance. Other: Generalized body wall edema, severe along the ventral abdomen and especially the left lateral abdominal wall and left flank. No soft tissue gas identified. Only trace free fluid in the pelvis. Musculoskeletal: No acute osseous abnormality identified. Spina bifida occulta in the lower thoracic and upper lumbar levels. IMPRESSION: 1. Streaky bilateral lower lobe opacity indeterminate for atelectasis versus bronchopneumonia. No pleural effusion. 2. Cardiomegaly. Generalized body wall edema, asymmetrically severe on the left. Small volume ascites in the abdomen. The constellation favors anasarca. 3. Negative for bowel obstruction although there is mild wall thickening throughout multiple small bowel loops in the left abdomen. Consider acute infectious enteritis. 4. Chronic gallstones.  No CT evidence of acute cholecystitis. 5. Unremarkable noncontrast kidneys and bladder. No evidence of obstructive uropathy. Electronically Signed   By: Odessa Fleming M.D.   On: 10/21/2019 17:33   DG Chest 2 View  Result  Date: 10/18/2019 CLINICAL DATA:  Shortness of breath EXAM: CHEST - 2 VIEW COMPARISON:  10/12/2019 FINDINGS: The heart size and mediastinal contours are within normal limits. Both lungs are clear. The visualized skeletal structures are unremarkable. IMPRESSION: No active cardiopulmonary disease. Electronically Signed   By: Deatra Robinson M.D.   On: 10/18/2019 01:21   NM Pulmonary Perfusion  Result Date: 10/18/2019 CLINICAL DATA:  Shortness of breath and chest pain. Elevated D-dimer EXAM: NUCLEAR MEDICINE PERFUSION LUNG SCAN TECHNIQUE: Perfusion images were obtained in multiple projections after intravenous injection of radiopharmaceutical. Views: Anterior, posterior, left lateral, right lateral, RPO, LPO, RAO, LAO RADIOPHARMACEUTICALS:  4.0 mCi Tc-66m MAA IV COMPARISON:  Chest radiograph October 18, 2019 FINDINGS: Radiotracer uptake is homogeneous and symmetric bilaterally. No focal perfusion defects are evident. IMPRESSION: No perfusion defects appreciable. No findings indicative of pulmonary embolus on this study. Electronically Signed   By: Bretta Bang III M.D.   On: 10/18/2019 13:51   US RENAL  Result Date: 10/18/2019 CLINICAL DATA:  Acute kidney injury. EXAM: RENAL / URINARY TRACT ULTRASOUND COMPLETE COMPARISON:  CT abdomen and pelvis 08/27/2017 FINDINGS: Image quality is degraded by large body habitus. Right Kidney: Renal measurements: 14.4 x 5.1 x 6.8 cm = volume: 260 mL. Echogenicity within normal limits.  No mass or hydronephrosis visualized. Left Kidney: Renal measurements: 14.6 x 6.4 x 6.5 cm = volume: 318 mL. Echogenicity within normal limits. No mass or hydronephrosis visualized. Bladder: Not visualized. Other: None. IMPRESSION: 1. Unremarkable appearance of the kidneys. No hydronephrosis. 2. Nonvisualization of the urinary bladder. Electronically Signed   By: Sebastian Ache M.D.   On: 10/18/2019 10:22   DG CHEST PORT 1 VIEW  Result Date: 10/21/2019 CLINICAL DATA:  Fever EXAM: PORTABLE  CHEST 1 VIEW COMPARISON:  October 18, 2019 FINDINGS: There is atelectatic change in the right base. The lungs elsewhere are clear. Heart is upper normal in size with pulmonary vascularity normal. No adenopathy. No bone lesions. IMPRESSION: Atelectatic change right base. Lungs elsewhere clear. Heart upper normal in size. No adenopathy appreciable. Electronically Signed   By: Bretta Bang III M.D.   On: 10/21/2019 13:40   DG Chest Port 1 View  Result Date: 10/12/2019 CLINICAL DATA:  Cough and shortness of breath EXAM: PORTABLE CHEST 1 VIEW COMPARISON:  05/10/2019 FINDINGS: Cardiomegaly and vascular pedicle widening. Congested appearance of vessels with cephalized blood flow. Generalized hazy opacity which is symmetric. Very limited study due to underpenetration and low lung volumes. IMPRESSION: CHF appearance. Electronically Signed   By: Marnee Spring M.D.   On: 10/12/2019 04:41   DG Abd 2 Views  Result Date: 10/19/2019 CLINICAL DATA:  49 year old female with nausea. EXAM: ABDOMEN - 2 VIEW COMPARISON:  Radiograph dated 09/10/2017. FINDINGS: Evaluation is limited due to body habitus and soft tissue attenuation. No bowel dilatation or evidence of obstruction. There is air within the colon. The osseous structures and soft tissues are grossly unremarkable. IMPRESSION: Negative. Electronically Signed   By: Elgie Collard M.D.   On: 10/19/2019 20:13   ECHOCARDIOGRAM COMPLETE  Result Date: 10/19/2019    ECHOCARDIOGRAM REPORT   Patient Name:   Sheila Gibson Date of Exam: 10/19/2019 Medical Rec #:  [id number]              Height:       67.0 in Accession #:    [id number]             Weight:       374.3 lb Date of Birth:  10-Jan-1970              BSA:          2.639 m Patient Age:    48 years               BP:           105/61 mmHg Patient Gender: F                      HR:           92 bpm. Exam Location:  Inpatient Procedure: 2D Echo, Color Doppler, Cardiac Doppler and Intracardiac             Opacification Agent Indications:    I50.31 Acute diastolic (congestive) heart failure  History:        Patient has prior history of Echocardiogram examinations, most                 recent 05/10/2019. CHF, COPD; Risk Factors:Hypertension and                 Diabetes.  Sonographer:    Irving Burton Senior RDCS Referring Phys: [id number] Teddy Spike IMPRESSIONS  1. Left ventricular ejection fraction, by estimation, is 60 to  65%. The left ventricle has normal function. The left ventricle has no regional wall motion abnormalities. There is moderate concentric left ventricular hypertrophy. Left ventricular diastolic parameters are consistent with Grade I diastolic dysfunction (impaired relaxation).  2. Right ventricular systolic function is normal. The right ventricular size is normal.  3. Left atrial size was mildly dilated.  4. The mitral valve is normal in structure. Trivial mitral valve regurgitation.  5. The aortic valve is tricuspid. There is mild calcification of the aortic valve. Aortic valve regurgitation is not visualized. Comparison(s): No significant change from prior study. FINDINGS  Left Ventricle: Left ventricular ejection fraction, by estimation, is 60 to 65%. The left ventricle has normal function. The left ventricle has no regional wall motion abnormalities. Definity contrast agent was given IV to delineate the left ventricular  endocardial borders. The left ventricular internal cavity size was normal in size. There is moderate concentric left ventricular hypertrophy. Left ventricular diastolic parameters are consistent with Grade I diastolic dysfunction (impaired relaxation). Right Ventricle: The right ventricular size is normal. Right vetricular wall thickness was not well visualized. Right ventricular systolic function is normal. Left Atrium: Left atrial size was mildly dilated. Right Atrium: Right atrial size was not well visualized. Pericardium: There is no evidence of pericardial effusion. Mitral Valve: The  mitral valve is normal in structure. There is mild thickening of the mitral valve leaflet(s). Mild mitral annular calcification. Trivial mitral valve regurgitation. Tricuspid Valve: The tricuspid valve is grossly normal. Tricuspid valve regurgitation is trivial. Aortic Valve: The aortic valve is tricuspid. There is mild calcification of the aortic valve. There is mild aortic valve annular calcification. Aortic valve regurgitation is not visualized. Pulmonic Valve: The pulmonic valve was not well visualized. Pulmonic valve regurgitation is not visualized. Aorta: The aortic root is normal in size and structure. IAS/Shunts: The atrial septum is grossly normal.  LEFT VENTRICLE PLAX 2D LVIDd:         4.10 cm LVIDs:         2.70 cm LV PW:         1.60 cm LV IVS:        1.30 cm LVOT diam:     1.90 cm LV SV:         111 LV SV Index:   42 LVOT Area:     2.84 cm  RIGHT VENTRICLE RV S prime:     12.80 cm/s TAPSE (M-mode): 2.0 cm LEFT ATRIUM              Index LA diam:        4.70 cm  1.78 cm/m LA Vol (A2C):   74.0 ml  28.04 ml/m LA Vol (A4C):   106.0 ml 40.16 ml/m LA Biplane Vol: 90.1 ml  34.14 ml/m  AORTIC VALVE AV Area (Vmax):    1.88 cm AV Area (Vmean):   1.81 cm AV Area (VTI):     1.78 cm AV Vmax:           300.00 cm/s AV Vmean:          225.000 cm/s AV VTI:            0.622 m AV Peak Grad:      36.0 mmHg AV Mean Grad:      23.0 mmHg LVOT Vmax:         199.00 cm/s LVOT Vmean:        144.000 cm/s LVOT VTI:          0.391 m  LVOT/AV VTI ratio: 0.63  AORTA Ao Root diam: 2.90 cm MITRAL VALVE MV Area (PHT): 3.37 cm     SHUNTS MV Decel Time: 225 msec     Systemic VTI:  0.39 m MV E velocity: 92.30 cm/s   Systemic Diam: 1.90 cm MV A velocity: 129.00 cm/s MV E/A ratio:  0.72 Laurance Flatten MD Electronically signed by Laurance Flatten MD Signature Date/Time: 10/19/2019/11:23:43 AM    Final    VAS [place] LOWER EXTREMITY VENOUS (DVT)  Result Date: 10/18/2019  Lower Venous DVT Study Indications: Edema.  Risk Factors: None  identified. Limitations: Body habitus and poor ultrasound/tissue interface. Comparison Study: No prior studies. Performing Technologist: Chanda Busing RVT  Examination Guidelines: A complete evaluation includes B-mode imaging, spectral Doppler, color Doppler, and power Doppler as needed of all accessible portions of each vessel. Bilateral testing is considered an integral part of a complete examination. Limited examinations for reoccurring indications may be performed as noted. The reflux portion of the exam is performed with the patient in reverse Trendelenburg.  +---------+---------------+---------+-----------+----------+--------------+ RIGHT    CompressibilityPhasicitySpontaneityPropertiesThrombus Aging +---------+---------------+---------+-----------+----------+--------------+ CFV      Full           Yes      Yes                                 +---------+---------------+---------+-----------+----------+--------------+ SFJ      Full                                                        +---------+---------------+---------+-----------+----------+--------------+ FV Prox  Full                                                        +---------+---------------+---------+-----------+----------+--------------+ FV Mid                  Yes      Yes                                 +---------+---------------+---------+-----------+----------+--------------+ FV Distal               Yes      Yes                                 +---------+---------------+---------+-----------+----------+--------------+ PFV      Full                                                        +---------+---------------+---------+-----------+----------+--------------+ POP      Full           Yes      Yes                                 +---------+---------------+---------+-----------+----------+--------------+  PTV                                                   Not visualized  +---------+---------------+---------+-----------+----------+--------------+ PERO                                                  Not visualized +---------+---------------+---------+-----------+----------+--------------+   +---------+---------------+---------+-----------+----------+--------------+ LEFT     CompressibilityPhasicitySpontaneityPropertiesThrombus Aging +---------+---------------+---------+-----------+----------+--------------+ CFV      Full           Yes      Yes                                 +---------+---------------+---------+-----------+----------+--------------+ SFJ      Full                                                        +---------+---------------+---------+-----------+----------+--------------+ FV Prox  Full                                                        +---------+---------------+---------+-----------+----------+--------------+ FV Mid   Full                                                        +---------+---------------+---------+-----------+----------+--------------+ FV Distal               Yes      Yes                                 +---------+---------------+---------+-----------+----------+--------------+ PFV      Full                                                        +---------+---------------+---------+-----------+----------+--------------+ POP      Full           Yes      Yes                                 +---------+---------------+---------+-----------+----------+--------------+ PTV                                                   Not visualized +---------+---------------+---------+-----------+----------+--------------+ PERO     Full                                                        +---------+---------------+---------+-----------+----------+--------------+  Summary: RIGHT: - There is no evidence of deep vein thrombosis in the lower extremity. However, portions of this  examination were limited- see technologist comments above.  - No cystic structure found in the popliteal fossa.  LEFT: - There is no evidence of deep vein thrombosis in the lower extremity. However, portions of this examination were limited- see technologist comments above.  - No cystic structure found in the popliteal fossa.  *See table(s) above for measurements and observations. Electronically signed by Waverly Ferrari MD on 10/18/2019 at 2:19:56 PM.    Final      Subjective: - no chest pain, shortness of breath, no abdominal pain, nausea or vomiting.   Discharge Exam: BP 113/63 (BP Location: Left Arm)   Pulse 73   Temp 97.8 F (36.6 C) (Oral)   Resp 20   Ht 5\' 7"  (1.702 m)   Wt (!) 176.4 kg   SpO2 98%   BMI 60.93 kg/m   General: Pt is alert, awake, not in acute distress Cardiovascular: RRR, S1/S2 +, no rubs, no gallops Respiratory: CTA bilaterally, no wheezing, no rhonchi Abdominal: Soft, NT, ND, bowel sounds + Extremities: no edema, no cyanosis   The results of significant diagnostics from this hospitalization (including imaging, microbiology, ancillary and laboratory) are listed below for reference.     Microbiology: Recent Results (from the past 240 hour(s))  Respiratory Panel by RT PCR (Flu A&B, Covid) - Nasopharyngeal Swab     Status: None   Collection Time: 10/18/19  4:39 AM   Specimen: Nasopharyngeal Swab  Result Value Ref Range Status   SARS Coronavirus 2 by RT PCR NEGATIVE NEGATIVE Final    Comment: (NOTE) SARS-CoV-2 target nucleic acids are NOT DETECTED.  The SARS-CoV-2 RNA is generally detectable in upper respiratoy specimens during the acute phase of infection. The lowest concentration of SARS-CoV-2 viral copies this assay can detect is 131 copies/mL. A negative result does not preclude SARS-Cov-2 infection and should not be used as the sole basis for treatment or other patient management decisions. A negative result may occur with  improper specimen  collection/handling, submission of specimen other than nasopharyngeal swab, presence of viral mutation(s) within the areas targeted by this assay, and inadequate number of viral copies (<131 copies/mL). A negative result must be combined with clinical observations, patient history, and epidemiological information. The expected result is Negative.  Fact Sheet for Patients:  12/18/19  Fact Sheet for Healthcare Providers:  https://www.moore.com/  This test is no t yet approved or cleared by the https://www.young.biz/ FDA and  has been authorized for detection and/or diagnosis of SARS-CoV-2 by FDA under an Emergency Use Authorization (EUA). This EUA will remain  in effect (meaning this test can be used) for the duration of the COVID-19 declaration under Section 564(b)(1) of the Act, 21 U.S.C. section 360bbb-3(b)(1), unless the authorization is terminated or revoked sooner.     Influenza A by PCR NEGATIVE NEGATIVE Final   Influenza B by PCR NEGATIVE NEGATIVE Final    Comment: (NOTE) The Xpert Xpress SARS-CoV-2/FLU/RSV assay is intended as an aid in  the diagnosis of influenza from Nasopharyngeal swab specimens and  should not be used as a sole basis for treatment. Nasal washings and  aspirates are unacceptable for Xpert Xpress SARS-CoV-2/FLU/RSV  testing.  Fact Sheet for Patients: [place]  Fact Sheet for Healthcare Providers: https://www.moore.com/  This test is not yet approved or cleared by the https://www.young.biz/ FDA and  has been authorized for detection and/or diagnosis of SARS-CoV-2  by  FDA under an Emergency Use Authorization (EUA). This EUA will remain  in effect (meaning this test can be used) for the duration of the  Covid-19 declaration under Section 564(b)(1) of the Act, 21  U.S.C. section 360bbb-3(b)(1), unless the authorization is  terminated or revoked. Performed at [facility], [address] [place], [place] [address]   Culture, blood (Routine X 2) w Reflex to ID Panel     Status: None (Preliminary result)   Collection Time: 10/21/19  1:30 PM   Specimen: BLOOD LEFT FOREARM  Result Value Ref Range Status   Specimen Description   Final    BLOOD LEFT FOREARM Performed at [facility], [address], [place], [place] [address]    Special Requests   Final    BOTTLES DRAWN AEROBIC AND ANAEROBIC Blood Culture adequate volume Performed at [facility], [address], [place], [place] [address]    Culture   Final    NO GROWTH 3 DAYS Performed at [facility], [address], [place], [place] [address]    Report Status PENDING  Incomplete  Culture, blood (Routine X 2) w Reflex to ID Panel     Status: None (Preliminary result)   Collection Time: 10/21/19  1:30 PM   Specimen: BLOOD  Result Value Ref Range Status   Specimen Description   Final    BLOOD LEFT ANTECUBITAL Performed at [facility], [address], [place], [place] [address]    Special Requests   Final    BOTTLES DRAWN AEROBIC AND ANAEROBIC Blood Culture adequate volume Performed at [facility], [address], [place], [place] [address]    Culture   Final    NO GROWTH 3 DAYS Performed at [facility], [address] [place], [place] [address]    Report Status PENDING  Incomplete  Respiratory Panel by RT PCR (Flu A&B, Covid) - Nasopharyngeal Swab     Status: None   Collection Time: 10/21/19  4:30 PM   Specimen: Nasopharyngeal Swab  Result Value Ref Range Status   SARS Coronavirus 2 by RT PCR NEGATIVE NEGATIVE Final    Comment: (NOTE) SARS-CoV-2 target nucleic acids are NOT DETECTED.  The SARS-CoV-2 RNA is generally detectable in upper respiratoy specimens during the acute phase of infection. The lowest concentration of SARS-CoV-2 viral copies this assay can detect is 131  copies/mL. A negative result does not preclude SARS-Cov-2 infection and should not be used as the sole basis for treatment or other patient management decisions. A negative result may occur with  improper specimen collection/handling, submission of specimen other than nasopharyngeal swab, presence of viral mutation(s) within the areas targeted by this assay, and inadequate number of viral copies (<131 copies/mL). A negative result must be combined with clinical observations, patient history, and epidemiological information. The expected result is Negative.  Fact Sheet for Patients:  https://www.moore.com/  Fact Sheet for Healthcare Providers:  https://www.young.biz/  This test is no t yet approved or cleared by the [place] FDA and  has been authorized for detection and/or diagnosis of SARS-CoV-2 by FDA under an Emergency Use Authorization (EUA). This EUA will remain  in effect (meaning this test can be used) for the duration of the COVID-19 declaration under Section 564(b)(1) of the Act, 21 U.S.C. section 360bbb-3(b)(1), unless the authorization is terminated or revoked sooner.     Influenza A by PCR NEGATIVE NEGATIVE Final   Influenza  B by PCR NEGATIVE NEGATIVE Final    Comment: (NOTE) The Xpert Xpress SARS-CoV-2/FLU/RSV assay is intended as an aid in  the diagnosis of influenza from Nasopharyngeal swab specimens and  should not be used as a sole basis for treatment. Nasal washings and  aspirates are unacceptable for Xpert Xpress SARS-CoV-2/FLU/RSV  testing.  Fact Sheet for Patients: https://www.moore.com/  Fact Sheet for Healthcare Providers: https://www.young.biz/  This test is not yet approved or cleared by the [place] FDA and  has been authorized for detection and/or diagnosis of SARS-CoV-2 by  FDA under an Emergency Use Authorization (EUA). This EUA will remain  in effect (meaning  this test can be used) for the duration of the  Covid-19 declaration under Section 564(b)(1) of the Act, 21  U.S.C. section 360bbb-3(b)(1), unless the authorization is  terminated or revoked. Performed at [facility], [address] [place], [place] [address]   Culture, Urine     Status: Abnormal   Collection Time: 10/21/19 11:25 PM   Specimen: Urine, Random  Result Value Ref Range Status   Specimen Description   Final    URINE, RANDOM Performed at [facility], [address] [place], [place] [address]    Special Requests   Final    NONE Performed at [facility], [address], [place], [place] [address]    Culture (A)  Final    <10,000 COLONIES/mL INSIGNIFICANT GROWTH Performed at [facility], [address] [place], [place] [address]    Report Status 10/23/2019 FINAL  Final     Labs: Basic Metabolic Panel: Recent Labs  Lab 10/19/19 0554 10/19/19 0554 10/20/19 0431 10/21/19 0352 10/22/19 1137 10/23/19 0437 10/24/19 0427  NA 137  --  136 135 138  --  139  K 4.1  --  3.9 4.3 4.0  --  4.0  CL 106  --  106 104 104  --  108  CO2 26  --  --  22  GLUCOSE 165*  --  157* 148* 167*  --  143*  BUN 28*  --  31* 35* 40*  --  30*  CREATININE 1.29*   < > 1.63* 1.82* 2.08* 1.70* 1.48*  CALCIUM 7.7*  --  7.5* 7.3* 7.7*  --  7.6*  MG  --   --   --   --   --   --  2.0  PHOS  --   --   --   --   --   --  3.2   < > = values in this interval not displayed.   Liver Function Tests: Recent Labs  Lab 10/19/19 0554 10/20/19 0431 10/21/19 1059 10/22/19 1137 10/24/19 0427  AST 40 41 43* 34 32  ALT 38 36 36 31 26  ALKPHOS 87 90 109 89 80  BILITOT 0.9 1.0 1.1 1.2 1.0  PROT 7.4 7.3 8.5* 7.9 7.5  ALBUMIN 2.1* 2.0* 2.4* 2.1* 1.9*   CBC: Recent Labs  Lab 10/18/19 0252 10/19/19 0554 10/20/19 0431 10/22/19 1137 10/24/19 0427  WBC 11.2* 9.0 8.7 21.1* 10.8*  NEUTROABS 6.5  --   --  17.1*  --   HGB  9.8* 9.2* 8.7* 9.6* 9.2*  HCT 31.9* 29.6* 28.0* 29.8* 28.9*  MCV 96.7 96.4 95.9 93.4 94.4  PLT 143* 141* 123* 151 148*   CBG: Recent Labs  Lab 10/23/19 0727 10/23/19 1103 10/23/19 1606 10/23/19 2052 10/24/19 0727  GLUCAP 95  156* 163* 159* 128*   Hgb A1c No results for input(s): HGBA1C in the last 72 hours. Lipid Profile No results for input(s): CHOL, HDL, LDLCALC, TRIG, CHOLHDL, LDLDIRECT in the last 72 hours. Thyroid function studies No results for input(s): TSH, T4TOTAL, T3FREE, THYROIDAB in the last 72 hours.  Invalid input(s): FREET3 Urinalysis    Component Value Date/Time   COLORURINE AMBER (A) 10/21/2019 2325   APPEARANCEUR HAZY (A) 10/21/2019 2325   LABSPEC 1.017 10/21/2019 2325   PHURINE 5.0 10/21/2019 2325   GLUCOSEU NEGATIVE 10/21/2019 2325   HGBUR LARGE (A) 10/21/2019 2325   BILIRUBINUR NEGATIVE 10/21/2019 2325   BILIRUBINUR small 07/21/2017 1601   KETONESUR NEGATIVE 10/21/2019 2325   PROTEINUR NEGATIVE 10/21/2019 2325   UROBILINOGEN 0.2 03/31/2018 1419   NITRITE NEGATIVE 10/21/2019 2325   LEUKOCYTESUR NEGATIVE 10/21/2019 2325    FURTHER DISCHARGE INSTRUCTIONS:   Get Medicines reviewed and adjusted: Please take all your medications with you for your next visit with your Primary MD   Laboratory/radiological data: Please request your Primary MD to go over all hospital tests and procedure/radiological results at the follow up, please ask your Primary MD to get all Hospital records sent to his/her office.   In some cases, they will be blood work, cultures and biopsy results pending at the time of your discharge. Please request that your primary care M.D. goes through all the records of your hospital data and follows up on these results.   Also Note the following: If you experience worsening of your admission symptoms, develop shortness of breath, life threatening emergency, suicidal or homicidal thoughts you must seek medical attention immediately by calling  [phone] or calling your MD immediately  if symptoms less severe.   You must read complete instructions/literature along with all the possible adverse reactions/side effects for all the Medicines you take and that have been prescribed to you. Take any new Medicines after you have completely understood and accpet all the possible adverse reactions/side effects.    Do not drive when taking Pain medications or sleeping medications (Benzodaizepines)   Do not take more than prescribed Pain, Sleep and Anxiety Medications. It is not advisable to combine anxiety,sleep and pain medications without talking with your primary care practitioner   Special Instructions: If you have smoked or chewed Tobacco  in the last 2 yrs please stop smoking, stop any regular Alcohol  and or any Recreational drug use.   Wear Seat belts while driving.   Please note: You were cared for by a hospitalist during your hospital stay. Once you are discharged, your primary care physician will handle any further medical issues. Please note that NO REFILLS for any discharge medications will be authorized once you are discharged, as it is imperative that you return to your primary care physician (or establish a relationship with a primary care physician if you do not have one) for your post hospital discharge needs so that they can reassess your need for medications and monitor your lab values.  Time coordinating discharge: 35 minutes  SIGNED:  Pamella Pert, MD, PhD 10/24/2019, 10:46 AM

## 2019-10-26 LAB — CULTURE, BLOOD (ROUTINE X 2)
Culture: NO GROWTH
Culture: NO GROWTH
Special Requests: ADEQUATE
Special Requests: ADEQUATE

## 2019-11-15 ENCOUNTER — Ambulatory Visit: Payer: Medicaid Other | Attending: Nurse Practitioner | Admitting: Nurse Practitioner

## 2019-11-15 ENCOUNTER — Other Ambulatory Visit: Payer: Self-pay

## 2020-01-07 DEATH — deceased
